# Patient Record
Sex: Male | Born: 1945 | Race: White | Hispanic: No | Marital: Married | Smoking: Never smoker
Health system: Southern US, Community
[De-identification: ages and names within clinical notes are randomized; demographics above are authoritative.]

## PROBLEM LIST (undated history)

## (undated) DIAGNOSIS — K7689 Other specified diseases of liver: Secondary | ICD-10-CM

## (undated) DIAGNOSIS — N281 Cyst of kidney, acquired: Secondary | ICD-10-CM

## (undated) DIAGNOSIS — D72810 Lymphocytopenia: Secondary | ICD-10-CM

## (undated) DIAGNOSIS — R161 Splenomegaly, not elsewhere classified: Secondary | ICD-10-CM

## (undated) DIAGNOSIS — G4730 Sleep apnea, unspecified: Secondary | ICD-10-CM

## (undated) DIAGNOSIS — I8500 Esophageal varices without bleeding: Secondary | ICD-10-CM

## (undated) DIAGNOSIS — K469 Unspecified abdominal hernia without obstruction or gangrene: Secondary | ICD-10-CM

## (undated) DIAGNOSIS — R188 Other ascites: Secondary | ICD-10-CM

## (undated) DIAGNOSIS — K7460 Unspecified cirrhosis of liver: Secondary | ICD-10-CM

## (undated) DIAGNOSIS — N200 Calculus of kidney: Secondary | ICD-10-CM

## (undated) DIAGNOSIS — I10 Essential (primary) hypertension: Secondary | ICD-10-CM

## (undated) DIAGNOSIS — K5730 Diverticulosis of large intestine without perforation or abscess without bleeding: Secondary | ICD-10-CM

## (undated) DIAGNOSIS — D126 Benign neoplasm of colon, unspecified: Secondary | ICD-10-CM

## (undated) DIAGNOSIS — M169 Osteoarthritis of hip, unspecified: Secondary | ICD-10-CM

## (undated) DIAGNOSIS — K7581 Nonalcoholic steatohepatitis (NASH): Secondary | ICD-10-CM

## (undated) DIAGNOSIS — E119 Type 2 diabetes mellitus without complications: Secondary | ICD-10-CM

## (undated) DIAGNOSIS — E785 Hyperlipidemia, unspecified: Secondary | ICD-10-CM

## (undated) DIAGNOSIS — D500 Iron deficiency anemia secondary to blood loss (chronic): Secondary | ICD-10-CM

## (undated) HISTORY — DX: Iron deficiency anemia secondary to blood loss (chronic): D50.0

## (undated) HISTORY — DX: Other specified diseases of liver: K76.89

## (undated) HISTORY — DX: Diverticulosis of large intestine without perforation or abscess without bleeding: K57.30

## (undated) HISTORY — DX: Type 2 diabetes mellitus without complications: E11.9

## (undated) HISTORY — DX: Other ascites: R18.8

## (undated) HISTORY — DX: Cyst of kidney, acquired: N28.1

## (undated) HISTORY — DX: Sleep apnea, unspecified: G47.30

## (undated) HISTORY — DX: Unspecified abdominal hernia without obstruction or gangrene: K46.9

## (undated) HISTORY — DX: Nonalcoholic steatohepatitis (NASH): K75.81

## (undated) HISTORY — DX: Osteoarthritis of hip, unspecified: M16.9

## (undated) HISTORY — DX: Calculus of kidney: N20.0

## (undated) HISTORY — DX: Benign neoplasm of colon, unspecified: D12.6

## (undated) HISTORY — DX: Hyperlipidemia, unspecified: E78.5

## (undated) HISTORY — PX: POLYPECTOMY: SHX149

## (undated) HISTORY — PX: KNEE SURGERY: SHX244

## (undated) HISTORY — DX: Essential (primary) hypertension: I10

## (undated) HISTORY — PX: ANKLE SURGERY: SHX546

## (undated) HISTORY — DX: Esophageal varices without bleeding: I85.00

## (undated) HISTORY — DX: Unspecified cirrhosis of liver: K74.60

## (undated) HISTORY — DX: Splenomegaly, not elsewhere classified: R16.1

---

## 1998-07-31 ENCOUNTER — Encounter: Admission: RE | Admit: 1998-07-31 | Discharge: 1998-10-29 | Payer: Self-pay | Admitting: Family Medicine

## 1999-01-16 ENCOUNTER — Encounter: Admission: RE | Admit: 1999-01-16 | Discharge: 1999-04-16 | Payer: Self-pay | Admitting: Family Medicine

## 1999-05-24 ENCOUNTER — Ambulatory Visit: Admission: RE | Admit: 1999-05-24 | Discharge: 1999-05-24 | Payer: Self-pay | Admitting: Family Medicine

## 1999-07-19 ENCOUNTER — Encounter: Admission: RE | Admit: 1999-07-19 | Discharge: 1999-07-19 | Payer: Self-pay | Admitting: Urology

## 1999-08-23 ENCOUNTER — Encounter: Payer: Self-pay | Admitting: Urology

## 1999-08-27 ENCOUNTER — Ambulatory Visit (HOSPITAL_COMMUNITY): Admission: RE | Admit: 1999-08-27 | Discharge: 1999-08-27 | Payer: Self-pay | Admitting: Urology

## 2000-03-23 ENCOUNTER — Encounter: Payer: Self-pay | Admitting: Family Medicine

## 2000-03-23 ENCOUNTER — Emergency Department (HOSPITAL_COMMUNITY): Admission: EM | Admit: 2000-03-23 | Discharge: 2000-03-23 | Payer: Self-pay | Admitting: Emergency Medicine

## 2000-03-23 ENCOUNTER — Ambulatory Visit (HOSPITAL_COMMUNITY): Admission: RE | Admit: 2000-03-23 | Discharge: 2000-03-23 | Payer: Self-pay | Admitting: Family Medicine

## 2000-04-24 ENCOUNTER — Encounter: Payer: Self-pay | Admitting: Urology

## 2000-04-24 ENCOUNTER — Encounter: Admission: RE | Admit: 2000-04-24 | Discharge: 2000-04-24 | Payer: Self-pay | Admitting: Urology

## 2000-04-25 ENCOUNTER — Encounter: Admission: RE | Admit: 2000-04-25 | Discharge: 2000-04-25 | Payer: Self-pay | Admitting: Urology

## 2000-04-25 ENCOUNTER — Encounter: Payer: Self-pay | Admitting: Urology

## 2000-04-28 ENCOUNTER — Ambulatory Visit (HOSPITAL_COMMUNITY): Admission: RE | Admit: 2000-04-28 | Discharge: 2000-04-28 | Payer: Self-pay | Admitting: Urology

## 2000-04-28 ENCOUNTER — Encounter: Payer: Self-pay | Admitting: Urology

## 2003-10-08 HISTORY — PX: GASTRIC BYPASS: SHX52

## 2004-03-13 ENCOUNTER — Encounter: Admission: RE | Admit: 2004-03-13 | Discharge: 2004-03-13 | Payer: Self-pay | Admitting: Surgery

## 2004-03-14 ENCOUNTER — Ambulatory Visit (HOSPITAL_BASED_OUTPATIENT_CLINIC_OR_DEPARTMENT_OTHER): Admission: RE | Admit: 2004-03-14 | Discharge: 2004-03-14 | Payer: Self-pay | Admitting: Surgery

## 2004-03-20 ENCOUNTER — Ambulatory Visit (HOSPITAL_COMMUNITY): Admission: RE | Admit: 2004-03-20 | Discharge: 2004-03-20 | Payer: Self-pay | Admitting: Surgery

## 2004-03-21 ENCOUNTER — Encounter: Admission: RE | Admit: 2004-03-21 | Discharge: 2004-06-19 | Payer: Self-pay | Admitting: Surgery

## 2004-04-13 ENCOUNTER — Inpatient Hospital Stay (HOSPITAL_BASED_OUTPATIENT_CLINIC_OR_DEPARTMENT_OTHER): Admission: RE | Admit: 2004-04-13 | Discharge: 2004-04-13 | Payer: Self-pay | Admitting: Cardiology

## 2004-05-08 ENCOUNTER — Ambulatory Visit (HOSPITAL_COMMUNITY): Admission: RE | Admit: 2004-05-08 | Discharge: 2004-05-08 | Payer: Self-pay | Admitting: Surgery

## 2004-05-08 ENCOUNTER — Encounter (INDEPENDENT_AMBULATORY_CARE_PROVIDER_SITE_OTHER): Payer: Self-pay | Admitting: *Deleted

## 2004-05-15 ENCOUNTER — Encounter (INDEPENDENT_AMBULATORY_CARE_PROVIDER_SITE_OTHER): Payer: Self-pay | Admitting: *Deleted

## 2004-05-15 ENCOUNTER — Inpatient Hospital Stay (HOSPITAL_COMMUNITY): Admission: RE | Admit: 2004-05-15 | Discharge: 2004-05-19 | Payer: Self-pay | Admitting: Surgery

## 2004-06-18 ENCOUNTER — Emergency Department (HOSPITAL_COMMUNITY): Admission: EM | Admit: 2004-06-18 | Discharge: 2004-06-18 | Payer: Self-pay | Admitting: Emergency Medicine

## 2004-07-02 ENCOUNTER — Encounter: Admission: RE | Admit: 2004-07-02 | Discharge: 2004-09-30 | Payer: Self-pay | Admitting: Surgery

## 2004-07-12 ENCOUNTER — Ambulatory Visit (HOSPITAL_BASED_OUTPATIENT_CLINIC_OR_DEPARTMENT_OTHER): Admission: RE | Admit: 2004-07-12 | Discharge: 2004-07-12 | Payer: Self-pay | Admitting: Urology

## 2004-07-12 ENCOUNTER — Ambulatory Visit (HOSPITAL_COMMUNITY): Admission: RE | Admit: 2004-07-12 | Discharge: 2004-07-12 | Payer: Self-pay | Admitting: Urology

## 2004-10-10 ENCOUNTER — Encounter: Admission: RE | Admit: 2004-10-10 | Discharge: 2005-01-08 | Payer: Self-pay | Admitting: Surgery

## 2005-03-14 ENCOUNTER — Encounter: Admission: RE | Admit: 2005-03-14 | Discharge: 2005-06-12 | Payer: Self-pay | Admitting: Surgery

## 2005-05-15 ENCOUNTER — Ambulatory Visit: Payer: Self-pay | Admitting: Oncology

## 2005-05-31 ENCOUNTER — Ambulatory Visit: Payer: Self-pay | Admitting: Sports Medicine

## 2005-06-24 ENCOUNTER — Ambulatory Visit (HOSPITAL_COMMUNITY): Admission: RE | Admit: 2005-06-24 | Discharge: 2005-06-24 | Payer: Self-pay | Admitting: Oncology

## 2005-06-24 ENCOUNTER — Encounter (INDEPENDENT_AMBULATORY_CARE_PROVIDER_SITE_OTHER): Payer: Self-pay | Admitting: Specialist

## 2005-07-04 ENCOUNTER — Ambulatory Visit: Payer: Self-pay | Admitting: Oncology

## 2005-09-05 ENCOUNTER — Ambulatory Visit: Payer: Self-pay | Admitting: Oncology

## 2005-09-13 ENCOUNTER — Ambulatory Visit (HOSPITAL_COMMUNITY): Admission: RE | Admit: 2005-09-13 | Discharge: 2005-09-13 | Payer: Self-pay | Admitting: Oncology

## 2005-11-27 ENCOUNTER — Ambulatory Visit: Payer: Self-pay | Admitting: Oncology

## 2006-03-25 ENCOUNTER — Ambulatory Visit: Payer: Self-pay | Admitting: Oncology

## 2006-05-06 ENCOUNTER — Ambulatory Visit: Payer: Self-pay | Admitting: Oncology

## 2006-05-08 LAB — CBC WITH DIFFERENTIAL/PLATELET
Eosinophils Absolute: 0 10*3/uL (ref 0.0–0.5)
MCV: 80.7 fL — ABNORMAL LOW (ref 81.6–98.0)
MONO%: 5.8 % (ref 0.0–13.0)
NEUT#: 2.9 10*3/uL (ref 1.5–6.5)
RBC: 4.66 10*6/uL (ref 4.20–5.71)
RDW: 14.4 % (ref 11.2–14.6)
WBC: 3.8 10*3/uL — ABNORMAL LOW (ref 4.0–10.0)

## 2006-05-08 LAB — COMPREHENSIVE METABOLIC PANEL
AST: 22 U/L (ref 0–37)
Albumin: 4.5 g/dL (ref 3.5–5.2)
Alkaline Phosphatase: 117 U/L (ref 39–117)
Glucose, Bld: 110 mg/dL — ABNORMAL HIGH (ref 70–99)
Potassium: 4.4 mEq/L (ref 3.5–5.3)
Sodium: 142 mEq/L (ref 135–145)
Total Protein: 6.3 g/dL (ref 6.0–8.3)

## 2006-05-14 ENCOUNTER — Encounter: Admission: RE | Admit: 2006-05-14 | Discharge: 2006-05-14 | Payer: Self-pay | Admitting: Orthopedic Surgery

## 2006-06-04 ENCOUNTER — Encounter: Admission: RE | Admit: 2006-06-04 | Discharge: 2006-06-04 | Payer: Self-pay | Admitting: Orthopedic Surgery

## 2006-06-25 ENCOUNTER — Encounter: Admission: RE | Admit: 2006-06-25 | Discharge: 2006-06-25 | Payer: Self-pay | Admitting: Orthopedic Surgery

## 2006-10-07 HISTORY — PX: TOTAL HIP ARTHROPLASTY: SHX124

## 2006-12-29 ENCOUNTER — Ambulatory Visit: Payer: Self-pay | Admitting: Oncology

## 2006-12-31 LAB — CBC WITH DIFFERENTIAL/PLATELET
Basophils Absolute: 0 10*3/uL (ref 0.0–0.1)
Eosinophils Absolute: 0 10*3/uL (ref 0.0–0.5)
HGB: 11.3 g/dL — ABNORMAL LOW (ref 13.0–17.1)
MCV: 71.6 fL — ABNORMAL LOW (ref 81.6–98.0)
MONO#: 0.2 10*3/uL (ref 0.1–0.9)
NEUT#: 2.6 10*3/uL (ref 1.5–6.5)
Platelets: 80 10*3/uL — ABNORMAL LOW (ref 145–400)
RBC: 4.66 10*6/uL (ref 4.20–5.71)
RDW: 16.5 % — ABNORMAL HIGH (ref 11.2–14.6)
WBC: 3.3 10*3/uL — ABNORMAL LOW (ref 4.0–10.0)

## 2006-12-31 LAB — IRON AND TIBC
Iron: 35 ug/dL — ABNORMAL LOW (ref 42–165)
UIBC: 401 ug/dL

## 2006-12-31 LAB — FERRITIN: Ferritin: 6 ng/mL — ABNORMAL LOW (ref 22–322)

## 2006-12-31 LAB — COMPREHENSIVE METABOLIC PANEL
Albumin: 4.4 g/dL (ref 3.5–5.2)
BUN: 17 mg/dL (ref 6–23)
CO2: 24 mEq/L (ref 19–32)
Calcium: 9.3 mg/dL (ref 8.4–10.5)
Chloride: 109 mEq/L (ref 96–112)
Glucose, Bld: 113 mg/dL — ABNORMAL HIGH (ref 70–99)
Potassium: 4.6 mEq/L (ref 3.5–5.3)
Sodium: 142 mEq/L (ref 135–145)
Total Protein: 6.6 g/dL (ref 6.0–8.3)

## 2007-01-28 ENCOUNTER — Ambulatory Visit (HOSPITAL_COMMUNITY): Admission: RE | Admit: 2007-01-28 | Discharge: 2007-01-28 | Payer: Self-pay | Admitting: Oncology

## 2007-07-01 ENCOUNTER — Ambulatory Visit: Payer: Self-pay | Admitting: Oncology

## 2007-08-24 ENCOUNTER — Ambulatory Visit: Payer: Self-pay | Admitting: Internal Medicine

## 2007-08-24 LAB — CONVERTED CEMR LAB
Basophils Relative: 0.5 % (ref 0.0–1.0)
Eosinophils Absolute: 0.1 10*3/uL (ref 0.0–0.6)
Eosinophils Relative: 1.4 % (ref 0.0–5.0)
Platelets: 112 10*3/uL — ABNORMAL LOW (ref 150–400)
RBC: 4.59 M/uL (ref 4.22–5.81)
WBC: 4.2 10*3/uL — ABNORMAL LOW (ref 4.5–10.5)

## 2007-09-10 ENCOUNTER — Ambulatory Visit: Payer: Self-pay | Admitting: Internal Medicine

## 2007-09-10 ENCOUNTER — Encounter: Payer: Self-pay | Admitting: Internal Medicine

## 2007-11-20 DIAGNOSIS — D509 Iron deficiency anemia, unspecified: Secondary | ICD-10-CM | POA: Insufficient documentation

## 2007-11-20 DIAGNOSIS — E785 Hyperlipidemia, unspecified: Secondary | ICD-10-CM

## 2007-11-20 DIAGNOSIS — D126 Benign neoplasm of colon, unspecified: Secondary | ICD-10-CM

## 2007-11-20 DIAGNOSIS — K648 Other hemorrhoids: Secondary | ICD-10-CM | POA: Insufficient documentation

## 2007-11-20 DIAGNOSIS — E119 Type 2 diabetes mellitus without complications: Secondary | ICD-10-CM

## 2007-11-20 DIAGNOSIS — M159 Polyosteoarthritis, unspecified: Secondary | ICD-10-CM

## 2007-11-20 DIAGNOSIS — G4730 Sleep apnea, unspecified: Secondary | ICD-10-CM

## 2007-11-20 DIAGNOSIS — I10 Essential (primary) hypertension: Secondary | ICD-10-CM | POA: Insufficient documentation

## 2007-11-20 DIAGNOSIS — Z87442 Personal history of urinary calculi: Secondary | ICD-10-CM

## 2007-11-20 DIAGNOSIS — K5730 Diverticulosis of large intestine without perforation or abscess without bleeding: Secondary | ICD-10-CM | POA: Insufficient documentation

## 2008-01-27 ENCOUNTER — Encounter: Admission: RE | Admit: 2008-01-27 | Discharge: 2008-01-27 | Payer: Self-pay | Admitting: Orthopedic Surgery

## 2008-03-15 ENCOUNTER — Ambulatory Visit: Payer: Self-pay | Admitting: Oncology

## 2008-03-15 LAB — CBC WITH DIFFERENTIAL/PLATELET
BASO%: 0.3 % (ref 0.0–2.0)
LYMPH%: 11.8 % — ABNORMAL LOW (ref 14.0–48.0)
MCHC: 32.6 g/dL (ref 32.0–35.9)
MONO#: 0.2 10*3/uL (ref 0.1–0.9)
RBC: 4.63 10*6/uL (ref 4.20–5.71)
RDW: 19.7 % — ABNORMAL HIGH (ref 11.2–14.6)
WBC: 4.2 10*3/uL (ref 4.0–10.0)
lymph#: 0.5 10*3/uL — ABNORMAL LOW (ref 0.9–3.3)

## 2008-03-18 LAB — ABO/RH

## 2008-03-21 ENCOUNTER — Inpatient Hospital Stay (HOSPITAL_COMMUNITY): Admission: RE | Admit: 2008-03-21 | Discharge: 2008-03-24 | Payer: Self-pay | Admitting: Orthopedic Surgery

## 2008-04-14 LAB — CBC WITH DIFFERENTIAL/PLATELET
BASO%: 0.3 % (ref 0.0–2.0)
Basophils Absolute: 0 10*3/uL (ref 0.0–0.1)
HCT: 30.9 % — ABNORMAL LOW (ref 38.7–49.9)
HGB: 10.3 g/dL — ABNORMAL LOW (ref 13.0–17.1)
MONO#: 0.2 10*3/uL (ref 0.1–0.9)
NEUT%: 74.1 % (ref 40.0–75.0)
WBC: 3 10*3/uL — ABNORMAL LOW (ref 4.0–10.0)
lymph#: 0.5 10*3/uL — ABNORMAL LOW (ref 0.9–3.3)

## 2008-04-14 LAB — IRON AND TIBC
%SAT: 7 % — ABNORMAL LOW (ref 20–55)
Iron: 31 ug/dL — ABNORMAL LOW (ref 42–165)

## 2008-04-14 LAB — COMPREHENSIVE METABOLIC PANEL
ALT: 19 U/L (ref 0–53)
Albumin: 4.4 g/dL (ref 3.5–5.2)
BUN: 16 mg/dL (ref 6–23)
CO2: 23 mEq/L (ref 19–32)
Calcium: 9.2 mg/dL (ref 8.4–10.5)
Chloride: 109 mEq/L (ref 96–112)
Creatinine, Ser: 1.01 mg/dL (ref 0.40–1.50)

## 2008-04-14 LAB — FERRITIN: Ferritin: 18 ng/mL — ABNORMAL LOW (ref 22–322)

## 2008-04-25 ENCOUNTER — Ambulatory Visit: Payer: Self-pay | Admitting: Oncology

## 2008-05-02 ENCOUNTER — Ambulatory Visit: Payer: Self-pay | Admitting: Internal Medicine

## 2008-05-17 ENCOUNTER — Ambulatory Visit: Payer: Self-pay | Admitting: Internal Medicine

## 2008-05-17 ENCOUNTER — Encounter: Payer: Self-pay | Admitting: Internal Medicine

## 2008-05-20 ENCOUNTER — Encounter: Payer: Self-pay | Admitting: Internal Medicine

## 2008-07-15 ENCOUNTER — Ambulatory Visit: Payer: Self-pay | Admitting: Oncology

## 2008-07-29 LAB — COMPREHENSIVE METABOLIC PANEL
ALT: 24 U/L (ref 0–53)
Alkaline Phosphatase: 123 U/L — ABNORMAL HIGH (ref 39–117)
Sodium: 142 mEq/L (ref 135–145)
Total Bilirubin: 0.5 mg/dL (ref 0.3–1.2)
Total Protein: 5.9 g/dL — ABNORMAL LOW (ref 6.0–8.3)

## 2008-07-29 LAB — IRON AND TIBC: %SAT: 7 % — ABNORMAL LOW (ref 20–55)

## 2008-07-29 LAB — CBC WITH DIFFERENTIAL/PLATELET
Basophils Absolute: 0 10*3/uL (ref 0.0–0.1)
EOS%: 1.7 % (ref 0.0–7.0)
Eosinophils Absolute: 0.1 10*3/uL (ref 0.0–0.5)
HGB: 11.4 g/dL — ABNORMAL LOW (ref 13.0–17.1)
MCH: 25.8 pg — ABNORMAL LOW (ref 28.0–33.4)
MONO#: 0.2 10*3/uL (ref 0.1–0.9)
NEUT#: 2.4 10*3/uL (ref 1.5–6.5)
RDW: 17.8 % — ABNORMAL HIGH (ref 11.2–14.6)
WBC: 3.1 10*3/uL — ABNORMAL LOW (ref 4.0–10.0)
lymph#: 0.5 10*3/uL — ABNORMAL LOW (ref 0.9–3.3)

## 2008-08-02 ENCOUNTER — Encounter: Payer: Self-pay | Admitting: Gastroenterology

## 2008-11-17 ENCOUNTER — Ambulatory Visit: Payer: Self-pay | Admitting: Oncology

## 2008-11-21 LAB — CBC WITH DIFFERENTIAL/PLATELET
BASO%: 0.7 % (ref 0.0–2.0)
EOS%: 1.5 % (ref 0.0–7.0)
HCT: 35.3 % — ABNORMAL LOW (ref 38.7–49.9)
LYMPH%: 16 % (ref 14.0–48.0)
MCH: 24.5 pg — ABNORMAL LOW (ref 28.0–33.4)
MCHC: 33 g/dL (ref 32.0–35.9)
NEUT%: 75.8 % — ABNORMAL HIGH (ref 40.0–75.0)
Platelets: 76 10*3/uL — ABNORMAL LOW (ref 145–400)
RBC: 4.76 10*6/uL (ref 4.20–5.71)

## 2008-11-21 LAB — COMPREHENSIVE METABOLIC PANEL
ALT: 31 U/L (ref 0–53)
AST: 32 U/L (ref 0–37)
Alkaline Phosphatase: 106 U/L (ref 39–117)
Creatinine, Ser: 1.08 mg/dL (ref 0.40–1.50)
Sodium: 139 mEq/L (ref 135–145)
Total Bilirubin: 0.8 mg/dL (ref 0.3–1.2)

## 2008-11-22 LAB — IRON AND TIBC
%SAT: 33 % (ref 20–55)
TIBC: 457 ug/dL — ABNORMAL HIGH (ref 215–435)

## 2008-11-23 ENCOUNTER — Encounter: Payer: Self-pay | Admitting: Gastroenterology

## 2008-12-30 ENCOUNTER — Ambulatory Visit: Payer: Self-pay | Admitting: Oncology

## 2009-04-21 ENCOUNTER — Encounter (INDEPENDENT_AMBULATORY_CARE_PROVIDER_SITE_OTHER): Payer: Self-pay | Admitting: *Deleted

## 2009-05-15 ENCOUNTER — Ambulatory Visit: Payer: Self-pay | Admitting: Oncology

## 2009-05-16 ENCOUNTER — Encounter: Admission: RE | Admit: 2009-05-16 | Discharge: 2009-05-16 | Payer: Self-pay | Admitting: Orthopedic Surgery

## 2009-05-17 ENCOUNTER — Encounter: Payer: Self-pay | Admitting: Internal Medicine

## 2009-05-17 ENCOUNTER — Encounter: Payer: Self-pay | Admitting: Gastroenterology

## 2009-05-17 LAB — COMPREHENSIVE METABOLIC PANEL
ALT: 27 U/L (ref 0–53)
AST: 33 U/L (ref 0–37)
CO2: 23 mEq/L (ref 19–32)
Calcium: 9.3 mg/dL (ref 8.4–10.5)
Chloride: 106 mEq/L (ref 96–112)
Potassium: 4.7 mEq/L (ref 3.5–5.3)
Sodium: 139 mEq/L (ref 135–145)
Total Protein: 6.5 g/dL (ref 6.0–8.3)

## 2009-05-17 LAB — CBC WITH DIFFERENTIAL/PLATELET
BASO%: 0 % (ref 0.0–2.0)
EOS%: 0.3 % (ref 0.0–7.0)
HCT: 40.8 % (ref 38.4–49.9)
MCHC: 34.7 g/dL (ref 32.0–36.0)
MONO#: 0.3 10*3/uL (ref 0.1–0.9)
NEUT%: 87.4 % — ABNORMAL HIGH (ref 39.0–75.0)
RBC: 4.55 10*6/uL (ref 4.20–5.82)
RDW: 14 % (ref 11.0–14.6)
WBC: 6.6 10*3/uL (ref 4.0–10.3)
lymph#: 0.5 10*3/uL — ABNORMAL LOW (ref 0.9–3.3)

## 2009-05-17 LAB — FERRITIN: Ferritin: 30 ng/mL (ref 22–322)

## 2009-05-22 ENCOUNTER — Ambulatory Visit: Payer: Self-pay | Admitting: Internal Medicine

## 2009-06-05 ENCOUNTER — Ambulatory Visit: Payer: Self-pay | Admitting: Internal Medicine

## 2009-06-20 ENCOUNTER — Encounter: Admission: RE | Admit: 2009-06-20 | Discharge: 2009-06-20 | Payer: Self-pay | Admitting: Orthopedic Surgery

## 2009-11-10 ENCOUNTER — Ambulatory Visit: Payer: Self-pay | Admitting: Oncology

## 2010-01-12 ENCOUNTER — Ambulatory Visit: Payer: Self-pay | Admitting: Oncology

## 2010-01-17 ENCOUNTER — Encounter: Payer: Self-pay | Admitting: Internal Medicine

## 2010-01-17 LAB — CBC WITH DIFFERENTIAL/PLATELET
BASO%: 0.2 % (ref 0.0–2.0)
HCT: 38.8 % (ref 38.4–49.9)
LYMPH%: 12.9 % — ABNORMAL LOW (ref 14.0–49.0)
MCH: 28.2 pg (ref 27.2–33.4)
MCHC: 33.9 g/dL (ref 32.0–36.0)
MCV: 83.1 fL (ref 79.3–98.0)
MONO%: 5.9 % (ref 0.0–14.0)
NEUT%: 80.1 % — ABNORMAL HIGH (ref 39.0–75.0)
Platelets: 65 10*3/uL — ABNORMAL LOW (ref 140–400)
RBC: 4.66 10*6/uL (ref 4.20–5.82)

## 2010-01-17 LAB — IRON AND TIBC: Iron: 103 ug/dL (ref 42–165)

## 2010-01-17 LAB — COMPREHENSIVE METABOLIC PANEL
ALT: 29 U/L (ref 0–53)
Albumin: 4.7 g/dL (ref 3.5–5.2)
CO2: 22 mEq/L (ref 19–32)
Calcium: 8.7 mg/dL (ref 8.4–10.5)
Chloride: 108 mEq/L (ref 96–112)
Potassium: 4.1 mEq/L (ref 3.5–5.3)
Sodium: 140 mEq/L (ref 135–145)
Total Bilirubin: 0.7 mg/dL (ref 0.3–1.2)
Total Protein: 6.3 g/dL (ref 6.0–8.3)

## 2010-01-17 LAB — FERRITIN: Ferritin: 17 ng/mL — ABNORMAL LOW (ref 22–322)

## 2010-05-10 ENCOUNTER — Encounter: Admission: RE | Admit: 2010-05-10 | Discharge: 2010-05-10 | Payer: Self-pay | Admitting: Orthopedic Surgery

## 2010-06-14 ENCOUNTER — Encounter: Admission: RE | Admit: 2010-06-14 | Discharge: 2010-06-14 | Payer: Self-pay | Admitting: Orthopedic Surgery

## 2010-07-17 ENCOUNTER — Ambulatory Visit: Payer: Self-pay | Admitting: Oncology

## 2010-07-19 ENCOUNTER — Encounter: Payer: Self-pay | Admitting: Internal Medicine

## 2010-07-19 LAB — IRON AND TIBC
Iron: 81 ug/dL (ref 42–165)
TIBC: 310 ug/dL (ref 215–435)
UIBC: 229 ug/dL

## 2010-07-19 LAB — CBC WITH DIFFERENTIAL/PLATELET
Basophils Absolute: 0 10*3/uL (ref 0.0–0.1)
Eosinophils Absolute: 0.1 10*3/uL (ref 0.0–0.5)
HGB: 13 g/dL (ref 13.0–17.1)
MCV: 91.1 fL (ref 79.3–98.0)
MONO#: 0.2 10*3/uL (ref 0.1–0.9)
MONO%: 5.7 % (ref 0.0–14.0)
NEUT#: 2.9 10*3/uL (ref 1.5–6.5)
Platelets: 56 10*3/uL — ABNORMAL LOW (ref 140–400)
RDW: 13.7 % (ref 11.0–14.6)

## 2010-07-19 LAB — COMPREHENSIVE METABOLIC PANEL
Albumin: 4.2 g/dL (ref 3.5–5.2)
Alkaline Phosphatase: 102 U/L (ref 39–117)
BUN: 15 mg/dL (ref 6–23)
CO2: 21 mEq/L (ref 19–32)
Calcium: 8.6 mg/dL (ref 8.4–10.5)
Chloride: 111 mEq/L (ref 96–112)
Glucose, Bld: 144 mg/dL — ABNORMAL HIGH (ref 70–99)
Potassium: 4.2 mEq/L (ref 3.5–5.3)

## 2010-10-07 HISTORY — PX: COLONOSCOPY: SHX174

## 2010-10-28 ENCOUNTER — Encounter: Payer: Self-pay | Admitting: Oncology

## 2010-11-08 NOTE — Letter (Signed)
[facility]   Imported By: Sherian Rein 02/05/2010 11:52:27  _____________________________________________________________________  External Attachment:    Type:   Image     Comment:   External Document

## 2010-11-08 NOTE — Letter (Signed)
Summary: [facility]  [facility]   Imported By: Lester [place] 08/09/2010 09:28:06  _____________________________________________________________________  External Attachment:    Type:   Image     Comment:   External Document

## 2011-01-18 ENCOUNTER — Encounter (HOSPITAL_BASED_OUTPATIENT_CLINIC_OR_DEPARTMENT_OTHER): Payer: BC Managed Care – PPO | Admitting: Oncology

## 2011-01-18 ENCOUNTER — Other Ambulatory Visit: Payer: Self-pay | Admitting: Oncology

## 2011-01-18 DIAGNOSIS — Z9884 Bariatric surgery status: Secondary | ICD-10-CM

## 2011-01-18 DIAGNOSIS — D696 Thrombocytopenia, unspecified: Secondary | ICD-10-CM

## 2011-01-18 DIAGNOSIS — D72819 Decreased white blood cell count, unspecified: Secondary | ICD-10-CM

## 2011-01-18 DIAGNOSIS — D509 Iron deficiency anemia, unspecified: Secondary | ICD-10-CM

## 2011-01-18 LAB — COMPREHENSIVE METABOLIC PANEL
BUN: 17 mg/dL (ref 6–23)
CO2: 22 mEq/L (ref 19–32)
Calcium: 8.3 mg/dL — ABNORMAL LOW (ref 8.4–10.5)
Chloride: 108 mEq/L (ref 96–112)
Creatinine, Ser: 1.07 mg/dL (ref 0.40–1.50)

## 2011-01-18 LAB — IRON AND TIBC
TIBC: 356 ug/dL (ref 215–435)
UIBC: 242 ug/dL

## 2011-01-18 LAB — CBC WITH DIFFERENTIAL/PLATELET
Basophils Absolute: 0 10*3/uL (ref 0.0–0.1)
EOS%: 1.3 % (ref 0.0–7.0)
HCT: 38.1 % — ABNORMAL LOW (ref 38.4–49.9)
HGB: 13.1 g/dL (ref 13.0–17.1)
MCH: 30.9 pg (ref 27.2–33.4)
MONO#: 0.1 10*3/uL (ref 0.1–0.9)
NEUT%: 83.5 % — ABNORMAL HIGH (ref 39.0–75.0)
lymph#: 0.4 10*3/uL — ABNORMAL LOW (ref 0.9–3.3)

## 2011-01-18 LAB — FERRITIN: Ferritin: 46 ng/mL (ref 22–322)

## 2011-02-19 NOTE — H&P (Signed)
[name] House, [name] House NO.:  [id number]   MEDICAL RECORD NO.:  [id number]          PATIENT TYPE:  INP   LOCATION:  NA                           FACILITY:  [facility]   PHYSICIAN:  Ollen Gross, M.D.    DATE OF BIRTH:  1946-06-05   DATE OF ADMISSION:  03/21/2008  DATE OF DISCHARGE:                              HISTORY & PHYSICAL   CHIEF COMPLAINT:  Right hip pain.   HISTORY OF PRESENT ILLNESS:  The patient is a 65 year old male who is  seen by Dr. Lequita Halt for ongoing right hip pain.  He is a Runner, broadcasting/film/video at  [employer], and has been complaining of his right hip for quite some time  now.  It was November of last year when he started developing  significant pain in the buttock and lateral hip pain and groin area.  He  also is a Careers adviser and baseball umpire.  Extremely active the  high school athletics.  It has progressively gotten worse and  interfering with his ability to work.  He is seen in the office, found  to have severe end-stage arthritis of the right hip with bone-on-bone  and some bony erosion in osteophyte formation.  It is felt now he has  reached a point where he would benefit from undergoing surgical  intervention.  We have reached the summertime when he is out of school.  Risks and benefits have been discussed.  He elected to proceed with  surgery.   ALLERGIES:  No known drug allergies.   CURRENT MEDICATIONS:  Celebrex, lisinopril, Ferrex, multivitamins  including chewable vitamin, iron supplements, B-complex, folic acid and  vitamin E.   PAST MEDICAL HISTORY:  1. Hypertension.  2. Anemia.  3. Diet-controlled diabetes mellitus which did resolve after is      gastric bypass surgery.  4. Renal calculi.  5. History of colonic polyps.   PAST SURGERIES:  1. Normal catheterization and cardiac catheterization in 2005.  2. Gallbladder 2005.  3. Gastric bypass surgery 2005.  4. Colonoscopy with removal of about 10 polyps.  5. Bilateral knee scopes.  6. Ankle surgery.   SOCIAL HISTORY:  Married, one daughter.  Denies tobacco products.  Seldom intake alcohol.  Teacher at [employer].   FAMILY HISTORY:  Father deceased age 37 of heart disease.  Mother  deceased age 13 with diabetes.   REVIEW OF SYSTEMS:  GENERAL:  No fevers, night sweats.  NEUROLOGICAL:  No Seizures, syncope or paralysis.  RESPIRATORY:  No shortness breath,  productive cough or hemoptysis.  CARDIOVASCULAR:  No chest pain,  orthopnea.  GI:  No nausea, vomiting, diarrhea or constipation.  GU: No  hematuria, dysuria or discharge.  Musculoskeletal: Right hip.   PHYSICAL EXAMINATION:  VITAL SIGNS:  Pulse 70, respirations 12, blood  pressure 130/72.  GENERAL:  A 65 year old white male well-nourished, well-developed, no  acute distress, accompanied by his wife, tall frame.  HEENT:  Normocephalic, atraumatic.  Pupils round and reactive.  Oropharynx clear.  EOMs intact.  NECK:  Supple.  CHEST:  Clear.  HEART:  Regular rate and rhythm.  ABDOMEN:  Slightly round.  Bowel sounds present.  RECTAL/BREASTS/GU:  Not done, not pertinent to present illness.  EXTREMITIES:  Right hip flexion 95, 0 internal rotation, about a 5  degrees external rotation contracture.  He is able to rotate externally  to about 20 degrees, abduction about 20-30 degrees.   IMPRESSION:  Osteoarthritis right hip.   PLAN:  The patient admitted to [facility], undergo a right  total hip replacement arthroplasty.  Surgery will be performed by Dr.  Ollen Gross.      Alexzandrew L. Perkins, P.A.C.      Ollen Gross, M.D.  Electronically Signed    ALP/MEDQ  D:  03/21/2008  T:  03/21/2008  Job:  [id number]   cc:   Ollen Gross, M.D.  Fax: [phone]

## 2011-02-19 NOTE — Discharge Summary (Signed)
NAMECHRSITOPHER, WIK NO.:  [id number]   MEDICAL RECORD NO.:  [id number]          PATIENT TYPE:  INP   LOCATION:  1617                         FACILITY:  [facility]   PHYSICIAN:  Ollen Gross, M.D.    DATE OF BIRTH:  25-Feb-1946   DATE OF ADMISSION:  03/21/2008  DATE OF DISCHARGE:  03/24/2008                               DISCHARGE SUMMARY   ADMITTING DIAGNOSES:  1. Osteoarthritis right hip.  2. Hypertension.  3. Anemia.  4. Diet-controlled diabetes mellitus (resolved after gastric bypass      surgery).  5. Renal calculi.  6. History of colonic polyps.   DISCHARGE DIAGNOSES:  1. Osteoarthritis right hip, status post right total hip replacement      arthroplasty.  2. Postoperative acute blood loss anemia.  3. Status post transfusion, without sequelae.  4. Hypertension.  5. Anemia.  6. Diet-controlled diabetes mellitus (resolved after gastric bypass      surgery).  7. Renal calculi.  8. History of colonic polyps.   PROCEDURE:  On March 21, 2008, right total hip.  Surgeon:  Dr. Lequita Halt.  Assistant:  Patrica Duel, P.A.-C.  Anesthesia:  General.   CONSULTS:  None.   BRIEF HISTORY:  [name] is a 65 year old male with end-stage  arthritis of the right hip, with progressive worsening pain and  dysfunction, who now presents for a total hip arthroplasty.   LABORATORY DATA:  Preoperative CBC showed hemoglobin low at 10.2,  hematocrit 31.3, white cell count 3.2, red cell count 4.66, platelets  low at 63.  Postoperative hemoglobin down to 9.2, drifted down to 8.6,  given 2 units of blood, back up to 9.5, last noted H&H 8.9 and 27.1.  Platelets came up to 82, back down to 61.  PT/PTT preop 14.6 and 27,  respectively.  INR 1.  Serial pro times followed.  Last noted PT/INR  19.5 and 1.6.  Chem panel on admission:  Low total protein of 5.9.  Minimally elevated glucose at 132.  Remaining chem panel within normal  limits.  Serial BMETs were followed.   Electrolytes remained within  normal limits.  Glucose went up from 132 to 189, back down to 124.  Preop UA negative.  Blood group type O positive.   EKG, March 10, 2008:  Normal sinus rhythm, normal EKG, confirmed by Dr.  Algie Coffer.  Hip films preoperatively:  Advanced degenerative changes  involving the right hip on March 10, 2008.  Chest x-ray, March 10, 2008:  No  active disease.   HOSPITAL COURSE:  The patient was admitted to [facility] and  tolerated the procedure well, and later transferred to the recovery room  and the orthopedic floor.  Started on PCA and p.o. analgesic for pain  control following surgery.  He had a good night after surgery and doing  pretty well on the morning of day #1, given 24 hours of postop IV  antibiotics.  Blood pressure was a little on the lower side, so we held  his ACE inhibitor blood pressure medication.  The drain was left in that  was placed at the time of surgery.  Discontinued the knee immobilizer.  Hemoglobin was a little low, and he had some preexisting  thrombocytopenia, which was noted preoperatively.  That was followed  closely, and his platelets essentially stayed between 60 and 80 the  whole time, with no big change.  He did have the drop in blood, though,  so we did give him a transfusion.  He got up and ambulated pretty well  on day #1 and also into day #2.  Dressing was changed on day #2.  Drain  was pulled.  Incision was healing well.  His thrombocytopenia  preoperatively was stable.  He continued to progress well with physical  therapy, and by day #3 he was tolerating medications, meeting his goals,  and was discharged home.   DISCHARGE PLAN:  1. The patient was discharged to home on March 21, 2008.  2. Discharge diagnoses:  Please see above.   DISCHARGE MEDICATIONS:  1. Percocet.  2. Robaxin.  3. Coumadin.   DIET:  Heart-healthy diet.   ACTIVITY:  Partial weightbearing 25%-50% right leg.  Hip precautions  with total hip  protocol.   FOLLOWUP:  In 2 weeks.   DISPOSITION:  Home.   CONDITION UPON DISCHARGE:  Improved.      Alexzandrew L. Perkins, P.A.C.      Ollen Gross, M.D.  Electronically Signed    ALP/MEDQ  D:  04/29/2008  T:  04/29/2008  Job:  [id number]

## 2011-02-19 NOTE — Assessment & Plan Note (Signed)
[facility]                         GASTROENTEROLOGY OFFICE NOTE   NAME:BUTLERHaitham, [name] House                     MRN:          [id number]  DATE:08/24/2007                            DOB:          [birth year]    REFERRING PHYSICIAN:  Barry Dienes. Eloise Harman, M.D.   REASON FOR CONSULTATION:  Iron deficiency anemia.   HISTORY:  This is a 65 year old white male with a prior history of  morbid obesity, for which he underwent laparoscopic gastric bypass  surgery approximately three years ago.  He has a prior history of  diabetes, which has resolved.  Also hyperlipidemia, hypertension,  osteoarthritis, kidney stones and sleep apnea.  He is also noted to have  a stable splenomegaly, for which he is followed by Dr. Blenda Nicely. Shadad.  The patient is referred now for iron deficiency anemia.   LABORATORY DATA:  Obtained on July 09, 2007, reveal anemia, with a  hemoglobin of 9.6.  His indices were microcytic with an MCV of 69.8.  He  was also leukopenic with a white blood cell count of 2.7 and  thrombocytopenic with a platelet count of 89,000.   Post-laparoscopic gastric bypass surgery he was to have been on iron  replacement, but was not particularly compliant.  He has been on iron  over the past month.  The patient denies abdominal pain, change in bowel  habits, melena or hematochezia.  His stools have been a bit darker on  iron.  His wife reports that his flatus is foul-smelling.  He denies a  prior history of GI problems or GI investigations.   PAST MEDICAL/SURGICAL HISTORY:  1. Hypertension.  2. Diabetes (diet-controlled at this point).  3. Dyslipidemia.  4. Osteoarthritis.  5. Kidney stones.  6. Sleep apnea.  7. Morbid obesity, status post laparoscopic gastric bypass with      incidental cholecystectomy.  The patient has lost approximately 160      pounds since his surgery.  8. Chronic stable splenomegaly.  9. Cosmetic surgery to remove excessive skin  after weight loss.   ALLERGIES:  No known drug allergies.   CURRENT MEDICATIONS:  1. Lisinopril 10 mg daily.  2. Iron 150 mg twice daily.  3. B12.  4. Multivitamin.  5. Celebrex p.r.n.   FAMILY HISTORY:  No family history of colon cancer or colon polyps.  A  brother and father with heart disease.  Mother with diabetes.   SOCIAL HISTORY:  The patient is married, with one daughter.  He is  accompanied by his wife, with whom he lives.  He attended graduate  school.  He is retired from [employer] and works  currently as a Pension scheme manager and girls' Therapist, occupational at  [employer].  He does not smoke and rarely uses alcohol.   REVIEW OF SYSTEMS:  Per the diagnostic evaluation form.   PHYSICAL EXAMINATION:  GENERAL:  A well-appearing male, in no acute  distress.  VITAL SIGNS:  Blood pressure 120/60, heart rate 88, weight 231 pounds.  He is 6 feet 1 inch in height.  HEENT:  Sclerae anicteric.  Conjunctivae  pink.  Oral mucosa intact.  NECK:  No adenopathy.  LUNGS:  Clear.  HEART:  Regular.  ABDOMEN:  Soft without mass, tenderness or hernia.   IMPRESSION:  This is a 65 year old gentleman who presents with iron  deficiency anemia.  No evidence of gastrointestinal bleeding by history.  He certainly could have an occult gastrointestinal lesion such as a  marginal ulcer, arteriovenous malformations or neoplasia with associated  blood loss; however, it is also quite likely that he has iron deficiency  as a consequence of his bypass surgery, with impaired iron absorption.  This is not uncommon.   RECOMMENDATIONS:  1. Obtain follow-up CBC today, to see if he has had any response to      oral iron therapy.  2. Schedule a colonoscopy and upper endoscopy, to evaluate the      gastrointestinal mucosa.  The nature of the procedure, as well as      the risks, benefits and alternatives were discussed in detail.  He      understood and agreed to proceed.   3. Continue iron therapy for now.  4. Ongoing general medical care with Dr. Barry Dienes. [place].     Wilhemina Bonito. Marina Goodell, MD  Electronically Signed    JNP/MedQ  DD: 08/24/2007  DT: 08/24/2007  Job #: [id number]   cc:   Barry Dienes. Eloise Harman, M.D.  Blenda Nicely. [employer]

## 2011-02-19 NOTE — Op Note (Signed)
NAMEWILLAIM, MODE NO.:  [id number]   MEDICAL RECORD NO.:  [id number]          PATIENT TYPE:  INP   LOCATION:  0011                         FACILITY:  [facility]   PHYSICIAN:  Ollen Gross, M.D.    DATE OF BIRTH:  [birth year]   DATE OF PROCEDURE:  03/21/2008  DATE OF DISCHARGE:                               OPERATIVE REPORT   PREOPERATIVE DIAGNOSIS:  Osteoarthritis right hip.   POSTOPERATIVE DIAGNOSIS:  Osteoarthritis right hip.   PROCEDURE:  Right total hip arthroplasty.   SURGEON:  Ollen Gross, M.D.   ASSISTANT:  Avel Peace, P.A.-C.   ANESTHESIA:  General.   ESTIMATED BLOOD LOSS:  600.   DRAINS:  Hemovac x1.   COMPLICATIONS:  None.   CONDITION:  Stable to recovery.   BRIEF CLINICAL NOTE:  Ms. Lange is 65 year old male with end-stage  arthritis of the right hip with progressively worsening pain and  dysfunction.  He presents now for right total hip arthroplasty.   PROCEDURE IN DETAIL:  After the successful administration of general  anesthetic, the patient was placed in left lateral decubitus position  with the right side up and held with the hip positioner.  Right lower  extremity was isolated from its perineum with plastic drapes and prepped  and draped in the usual sterile fashion.  Short posterolateral incision  was made with at 10 blade through the subcutaneous tissue to the level  of fascia lata which was incised in line with the skin incision.  The  sciatic nerve was palpated and protected and short rotators isolated off  the femur.  Capsulectomy was performed, and the hip was dislocated.  Center of femoral head was marked, and the trial prosthesis was placed  such that the center of trial head corresponded to center of his native  femoral head.  Osteotomy line was marked on the femoral neck, and  osteotomy made with an oscillating saw.  Femoral head was removed and  the femur retracted anteriorly to gain acetabular exposure.   Acetabular retractors were placed and labrum and osteophytes removed.  Acetabular reaming begins at 45 mm, coursing in increments of 2 to 55  mm.  Then, a 56-mm pinnacle acetabular shell was placed in anatomic  position and transfixed with two dome screws with excellent purchase.  The apex hole eliminator was placed, and then the permanent 40-mm  neutral Ultamet metal liner was placed for a metal-on-metal hip  replacement.   Femur was prepared with the canal finder and irrigation.  Axial reaming  was performed up to 15.5 mm, proximal reaming to 20D and the sleeve  machined to a large.  A 20D large trial sleeve was placed with a 20 x 15  stem and 36 standard neck.  The neck was about 10 degrees beyond his  native anteversion.  A 40 plus 0 head was placed and the hips reduced  with excellent stability.  He has got full extension and full external  rotation to 70 degrees of flexion, 40 degrees of adduction, 90 degrees  internal rotation and 90 degrees of flexion, 70  degrees of internal  rotation.  By placing the right leg on top of the left, it was felt as  though the leg lengths were equal.  The hips were then dislocated and  trials removed.  The permanent 20D large sleeve was placed with a 20 x  15 stem, 36 standard neck, 10 degrees beyond native anteversion.  A 36  plus 0 head was placed and hips reduced with the same stability  parameters.  Wounds were copiously irrigated with saline solution and  short rotators reattached to the femur through drill holes.  Fascia lata  was closed over a Hemovac drain with running #2 Quill.  Subcutaneous  tissue closed deep with running 0 Quill and superficially with  interrupted 2-0 Vicryl and subcuticular closed with running 4-0  Monocryl.  The incisions cleaned and dried, and Steri-Strips and bulky  sterile dressing applied.  The drains hooked to suction, and he was then  placed into a knee immobilizer, awakened and transferred to recovery in  stable  condition.      Ollen Gross, M.D.  Electronically Signed     FA/MEDQ  D:  03/21/2008  T:  03/21/2008  Job:  [id number]

## 2011-02-22 NOTE — Op Note (Signed)
[name], [name] House                  ACCOUNT NO.:  [id number]   MEDICAL RECORD NO.:  [id number]          PATIENT TYPE:  AMB   LOCATION:  NESC                         FACILITY:  [facility]   PHYSICIAN:  Valetta Fuller, M.D.  DATE OF BIRTH:  1946-03-06   DATE OF PROCEDURE:  07/12/2004  DATE OF DISCHARGE:                                 OPERATIVE REPORT   PREOPERATIVE DIAGNOSES:  1.  Left distal ureteral calculus.  2.  Nausea and vomiting.   POSTOPERATIVE DIAGNOSIS:  No ureteral calculus found.   PROCEDURE PERFORMED:  Cystoscopy, left retrograde pyelography, ureteroscopy.   SURGEON:  Valetta Fuller, M.D.   ANESTHESIA:  General.   INDICATIONS:  [name] House is a patient of mine who does have a history of  nephrolithiasis.  Approximately a month ago, he presented to the [facility]  emergency room with left flank pain.  At that time, he was diagnosed with a  3-4 mm stone in his left mid ureter.  That was additionally diagnosed on  KUB.  A CT scan in our office confirmed what appeared to be a 4 mm stone in  his left mid ureter, causing hydronephrosis.  I saw him back for followup  approximately 10 days ago.  He was doing reasonably well.  We recommended  continued attempts to try to spontaneously pass the stone.  Since I last saw  him, he has had a couple of episodes of significant discomfort.  He has also  had multiple episodes of nausea with ongoing emesis.  He has also been  increasingly lethargic.  When I saw him in followup the day before surgery,  the patient had no evidence of infection on urinalysis.  On KUB, there did  appear to be the continued presence of suspicious calcification in the area  of the distal left ureter.  The patient was not having much pain at that  time and was continuing to have fairly constant nausea and intermittent  emesis.  The stone appeared to have been present for approximately a month,  and at this point, we felt that intervention was indicated.  We  were  uncertain whether his nausea was ongoing due to this ureteral calculus or  whether potentially it was a result of the recent gastric bypass surgery he  had.  He presents now for evaluation and treatment.   TECHNIQUE/FINDINGS:  The patient was brought to the operating room, where he  had successful induction of general anesthesia.  He was placed in a  lithotomy position and prepped and draped in a usual manner.  On cystoscopy,  he had some trilobar hyperplasia.  Upon inserting the scope into his  bladder, there were two small 1-2 mm stone fragments in his bladder.  The  left hemitrigone looked somewhat inflamed, consistent with a distal ureteral  calculus; however, on retrograde pyelography, there was no evidence of any  obstruction and no evidence of hydronephrosis.  We did not see an obvious  filling defect, but I was concerned that we might be missing a  nonobstructing stone.  A guidewire was  placed to the left renal pelvis, and  direct visual ureteroscopy of the entire left ureter to the proximal aspect  was performed.  One could see some mucosal edema and inflammation in the  distal ureter, consistent with a stone at one time, but there was no stone  noted in the ureter.  For that reason, we did not  place a stent and felt there was spontaneous passage of the stone at some  point.  The patient will need to follow up with Dr. Daphine House with regard to  his ongoing nausea, which does not at this point appear to be secondary to  ureteral calcification.  He was brought to the recovery room in stable  condition in stable condition.      DSG/MEDQ  D:  07/13/2004  T:  07/13/2004  Job:  [id number]   cc:   [place] [name] House, M.D.  [address], [address]  [place]  [place] [address]  Fax: [phone]

## 2011-02-22 NOTE — Cardiovascular Report (Signed)
NAME:  [name] House, [name] House NO.:  [id number]   MEDICAL RECORD NO.:  [id number]                   PATIENT TYPE:  OIB   LOCATION:  6501                                 FACILITY:  [facility]   PHYSICIAN:  Peter M. [place]azil[place], M.D.               DATE OF BIRTH:  [birth year]   DATE OF PROCEDURE:  DATE OF DISCHARGE:  04/13/2004                              CARDIAC CATHETERIZATION   INDICATION FOR PROCEDURE:  A 65 year old white male being evaluated for  gastric bypass surgery for severe morbid obesity.  He has multiple cardiac  risk factors including diabetes, hypertension, hyperlipidemia.  Adenosine  Cardiolite study was suggested with a small area of anterior wall ischemia.   PROCEDURE:  1. Left heart catheterization.  2. Coronary and left ventricular angiography.   ACCESS:  Access is via the right femoral artery using standard Seldinger  technique.   EQUIPMENT:  A 4 French, 4-cm, right-and-left Judkins catheter.  A 4 French  pigtail catheter.  A 4 French arterial sheath.   MEDICATIONS:  Local anesthesia with 1% Xylocaine.  Contrast 100 cc of  Omnipaque.   HEMODYNAMIC DATA:  1. Aortic pressures is 115/65 with a mean of 86.  2. Left ventricle pressure is 120 with an EDP of 8 mmHg.   ANGIOGRAPHIC DATA:  1. The left coronary artery arises and distributes normal.  2. The left main coronary artery appears normal.  3. The left anterior descending artery has a 10% narrowing at the ostium.     Otherwise, the vessel appears normal.  4. The left circumflex is a very large vessel.  It gives rise to 2 marginal     vessels.  The first marginal vessel is relatively small, but appears     normal.  The second marginal is very large and is normal.  5. The right coronary artery has a upwards takeoff with a shepherd's crook     in the proximal vessel.  It is a dominant vessel and is normal.   LEFT VENTRICULAR ANGIOGRAPHY:  The left ventricular angiography was  performed in the  RAO view.  This demonstrates normal left ventricular size  and contractility with normal systolic function.  The ejection fraction is  estimated at 60%.  There is no mitral regurgitation or prolapse.   FINAL INTERPRETATION:  1. Normal coronary anatomy.  2. Normal left ventricular function.   PLAN:  The patient is cleared for gastric bypass surgery.                                               Peter M. [place]azil[place], M.D.    PMJ/MEDQ  D:  04/13/2004  T:  04/14/2004  Job:  [id number]   cc:   [place] Daphine Deutscher, M.D.  [address], [address]  [place]  [place] [address]  Fax: [phone]   Reuel Boom M.D. Jarold Motto

## 2011-02-22 NOTE — Op Note (Signed)
NAME:  [name] House, [name] House                            ACCOUNT NO.:  [id number]   MEDICAL RECORD NO.:  [id number]                   PATIENT TYPE:  AMB   LOCATION:  ENDO                                 FACILITY:  [facility]   PHYSICIAN:  Sandria Bales. Ezzard Standing, M.D.               DATE OF BIRTH:  December 18, 1945   DATE OF PROCEDURE:  05/08/2004  DATE OF DISCHARGE:                                 OPERATIVE REPORT   PREOPERATIVE DIAGNOSIS:  Questionable duodenal polyp, per upper  gastrointestinal.   POSTOPERATIVE DIAGNOSIS:  Approximate 7-8 mm polyp in duodenal bulb and bile  reflux.   PROCEDURE:  Esophagogastroduodenoscopy with biopsy and coagulation of  duodenal polyp.   SURGEON:  Sandria Bales. Ezzard Standing, M.D.   ANESTHESIA:  Versed 6 mg, Demerol 50 mg of Demerol.   COMPLICATIONS:  None.   PROCEDURE:  [name] is a 65 year old morbidly obese white male who is  anticipating gastric bypass surgery in approximately one week by Dr. Daphine Deutscher  for his morbid obesity.  An upper GI was performed, which showed a polyp in  his duodenal bulb, and he now comes for endoscopy to evaluate this.  The  patient understands that post surgery this anatomy of his intestines may be  very difficult to reach or to examine.  The back of the throat was  anesthetized with Cetacaine.  He was given 50 mg of Demerol, 6 mg of Versed.  A flexible Olympus endoscope was passed without difficulty down the throat  into his stomach.  He was noted to have a large amount of bile in his  stomach.  The stomach mucosa itself looked fairly benign-appearing, but I  was just surprised at the amount of bile in his stomach.  I was able to  cannulate the pylorus.  Immediately beyond the pylorus, there was a probably  6-8 mm polyp which was actually fairly firm.  There was overlying mucosa,  which appeared normal, raising the question as to whether this may be some  kind of ectopic pancreatic tissue or lipoma, although it seemed firmer than  what a lipoma  would be.  I was able to get to the second portion of the  duodenum, but it was hard to advance the scope, because his stomach really  __________ on itself.  I saw no other growth or abnormality within the  duodenum.  Again, the polyp was biopsied and burned, probably six times,  using hot biopsy forceps.  The scope was then withdrawn through the stomach  and retroflexed.  Again, the outside of the bowel reflux, there was no other  mass or nodule within the stomach . The GE junction was at about 43-44 cm  with his distal esophagus being unremarkable.   The patient tolerated the procedure well.  I talked to his wife at the end  of the procedure.  They are to check with [place] in 2-3 days  for pathology  report.                                               Sandria Bales. Ezzard Standing, M.D.    DHN/MEDQ  D:  05/08/2004  T:  05/08/2004  Job:  [id number]   cc:   Thornton Park Daphine Deutscher, M.D.  [address]  [place]  [place] [address]  Fax: [phone]   Barry Dienes. Eloise Harman, M.D.  [address]  [place]  [place] [address]  Fax: [phone]

## 2011-02-22 NOTE — Op Note (Signed)
NAME:  [name] House, [name] House                            ACCOUNT NO.:  [id number]   MEDICAL RECORD NO.:  [id number]                   PATIENT TYPE:  INP   LOCATION:  0001                                 FACILITY:  [facility]   PHYSICIAN:  Thornton Park. Daphine Deutscher, M.D.             DATE OF BIRTH:  [birth year]   DATE OF PROCEDURE:  05/15/2004  DATE OF DISCHARGE:                                 OPERATIVE REPORT   PREOPERATIVE DIAGNOSES:  1. Morbid obesity with body mass index greater than 40.  2. Type 2 diabetes mellitus.  3. Hypertension.  4. Hypercholesterolemia.  5. History of kidney stones.  6. Giant gallstone with chronic cholecystitis.  7. Nine prior knee and ankle surgeries.  8. Osteoarthritis of the hip.   POSTOPERATIVE DIAGNOSES:  1. Morbid obesity with body mass index greater than 40.  2. Type 2 diabetes mellitus.  3. Hypertension.  4. Hypercholesterolemia.  5. History of kidney stones.  6. Giant gallstone with chronic cholecystitis.  7. Nine prior knee and ankle surgeries.  8. Osteoarthritis of the hip.   PROCEDURE:  1. Laparoscopic Roux-en-Y gastric bypass (45 cm biliopancreatic limb and 100     cm Roux limb).  2. Laparoscopic cholecystectomy with intraoperative cholangiogram (5.6 cm     gallstone that required 1-1/2 hour dissection at the end of the case).   SURGEON:  Luretha Murphy, M.D.   ASSISTANTS:  1. Danna Hefty, M.D., gastric bypass and upper endoscopy at the     completion of the gastrojejunostomy.  2. Glenna Fellows, M.D., cholecystectomy.   DESCRIPTION OF PROCEDURE:  [name] House was taken to room 1 on May 15, 2004.  Preoperatively, we had discussed the addition of a cholecystectomy  to this procedure, and I told him that it might add some morbidity because  it may lengthen the case, but he really wanted [place] to try to do this.  After  he was asleep and the abdomen had been prepped with Betadine, I entered  through a left subcostal incision using an  OptiView technique and entered  without difficulty and inflated the abdomen.  Two 12s were placed over on  the right side and a 10-11 on the left side just above the umbilicus.  I  surveyed an area over near his cecum where he had some anterior adhesions,  and he had never had previous surgery noted, but we went over and looked at  it, and I could not see any evidence of a neoplastic process, felt it could  have been old diverticular disease.  It was more in the ascending colon  region.   The gallbladder was found to be kind of walled off and had been chronically  inflamed.   First we elevated the omentum.  Fortunately, [name] had lost about 23  pounds on his preoperative diet, and I think this helped his liver shrink  and his omental mass  as despite losing this, he was still pretty big.  We  elevated the omentum, identified the ligament of Treitz and then went distal  to that some 45 cm dividing the bowel at that location.  I divided it at 45,  as it looked like that would go up easier.  After dividing it, I put a  Penrose drain on the Roux side and then measured 100 cm.  To divide the  bowel, I used actually two firings of the EndoGIA, went down into the  mesentery slightly.  No bleeding was noted.  I then created the side-to-side  jejunojejunostomy, aligning the bowel at the marked areas and then placing a  marked suture to hold it on the antimesenteric borders.  I opened, created a  common defect with the EndoGIA, and then closed that with two sutures of 2-0  Vicryl.  This was tied in the middle, and it seemed to be a nice, snug  closure.  Tisseal was placed on this area.  I also closed the mesentery with  a running 2-0 silk.  Laparo-Ty's  were used on either ends of that.   I then placed a Nathanson retractor in the upper abdomen.  I also divided  the omentum which I thought might make this come up a little easier.   Next, we began the dissection around the proximal foregut  and dissected  along the cardia of the stomach, and I found there was a lot of fat over  that, and it was obscured somewhat, but I was able to make some inroads into  that area using the blunt finger dissector.  We then measured about 4.5 cm  along the lesser curvature and then went in that area, dividing the stomach  perpendicular to the lesser curve and then creating a pouch with multiple  firings of the EndoGIA and finally heading cephalad.  When this was done, we  did have an Ewald tube in place.  Last firing, I divided the stomach  entirely near the diaphragm.  There was some bleeding on the gastric remnant  which was controlled with clips.  Tisseal was placed on both staple lines  which appeared intact.   Next, the Roux limb was brought up with the candy-cane to the right and was  sutured along the staple line posteriorly with a running 2-0 Vicryl, tying  the end on the left side and then putting a Laparo-Ty on the right side.  We  then used the Ewald tube to help [place] open the pouch, and I opened both the  pouch and the stomach and then put an EndoGIA in for firing, creating the  common channel.  I inspected the anastomosis, and it was not bleeding.  This  otomy was closed with 2-0 Vicryl from either end and tied in the middle.  I  tested it, and there was one little area that looked a little slack on the  left side.  I went ahead and put a horizontal mattress, again using the  separate 2-0 Vicryl, and this completed that layer.  We passed some gas down  through the Ewald tube, submerging the first closure, and no bleed was  noted.   The anterior layer was then buttressed with a second layer of serosal  sutures placed with the free suture needle of 2-0 Vicryl using a Laparo-Ty  at both ends.  Endoscopy was then performed.  Dr. Luan Pulling passed the  endoscope, and I submerged the anastomosis, we inflated, and  there were no air bubbles or evidence of leak.  The endoscope was  withdrawn.  She will  dictate that in a separate note.   Some Tisseal was applied to the anastomosis proximally.  No bleeding was  noted.   Next, Dr. Johna Sheriff scrubbed in, and we began taking down this chronically  inflamed gallbladder containing a 5.6 cm gallstone.  Using the harmonic  scalpel, I took down many omental adhesions and dissected down to the cystic  duct.  I put a clip on the gallbladder side and incised that and inserted  the Reddick catheter which went into the common duct.  I then did a  cholangiogram showing proximal filling and then distal filling and flow into  the duodenum.  The cystic duct was then double-clipped, divided, and all the  vessels on the way out were clipped as well as the harmonic scalpel was used  to divide these.  I did not enter the gallbladder in removing it but once  detached, I placed it in a bag.  Because of the monster size of this stone,  I had to enlarge one of the port sites and then was able to get down and  over about 30 minutes we were able to crush this stone.  Finally, we were  able to get it out.  There was no spillage of either stone or bile.  I then  inserted my finger into this dilated hole and using direct laparoscopic  vision, used the Endoclose to place two sutures of 0 Vicryl through the  fascia and tying these down to close that defect.   A Jackson-Pratt drain was brought out through the left lateral port, placed  beside the anastomosis and then the gallbladder bed.  I then inspected all  the other anastomotic sites, and everything was intact, dry, and looked  good.  The abdomen was deflated, and all the port sites were removed.  The  wounds were closed with 4-0 Vicryl.  I did have to enlarge my incision in  the upper midline where the Nathanson retractor was to control a little  superficial hemorrhage.  After closing with 4-0 Vicryl, I then used staples  on the skin.  The patient seemed to tolerate the procedure well and  was  taken to the recovery room in satisfactory condition.                                               Thornton Park Daphine Deutscher, M.D.    MBM/MEDQ  D:  05/15/2004  T:  05/15/2004  Job:  [id number]   cc:   Peter M. [place], M.D.  [address]  [place], [place] [address]  Fax: [phone]   Barry Dienes. Eloise Harman, M.D.  [address]  [place]  [place] [address]  Fax: [phone]

## 2011-02-22 NOTE — Op Note (Signed)
NAME:  [name] House, [name] House                            ACCOUNT NO.:  [id number]   MEDICAL RECORD NO.:  [id number]                   PATIENT TYPE:  INP   LOCATION:  0001                                 FACILITY:  [facility]   PHYSICIAN:  Vikki Ports, M.D.         DATE OF BIRTH:  [birth year]   DATE OF PROCEDURE:  05/15/2004  DATE OF DISCHARGE:                                 OPERATIVE REPORT   PREOPERATIVE DIAGNOSIS:  Morbid obesity.   POSTOPERATIVE DIAGNOSIS:  Morbid obesity.   PROCEDURE:  Upper endoscopy.   SURGEON:  Vikki Ports, M.D.   DESCRIPTION OF PROCEDURE:  At the completion of laparoscopic Roux-en-Y  gastric bypass by Dr. Luretha Murphy, I introduced an Olympus endoscope in  through the oropharynx and down through the esophagus.  It is approximately  48 cm from the incisors.  The Z line was visualized.  The pouch, which  measured about 5 cm, was intact.  The anastomosis was patent.  There was no  evidence of leak.  Air was aspirated out, and the endoscope was removed.                                               Vikki Ports, M.D.    KRH/MEDQ  D:  05/15/2004  T:  05/15/2004  Job:  [id number]

## 2011-02-22 NOTE — Discharge Summary (Signed)
NAME:  [name] House, [name] House                            ACCOUNT NO.:  [id number]   MEDICAL RECORD NO.:  [id number]                   PATIENT TYPE:  INP   LOCATION:  0463                                 FACILITY:  [facility]   PHYSICIAN:  Thornton Park. Daphine Deutscher, M.D.             DATE OF BIRTH:  13-Aug-1946   DATE OF ADMISSION:  05/15/2004  DATE OF DISCHARGE:  05/19/2004                                 DISCHARGE SUMMARY   ADMISSION DIAGNOSES:  1. Morbid obesity.  2. A 5.6 cm gallstone.   PROCEDURE:  Laparoscopy Roux-en-Y gastric bypass with laparoscopic  cholecystectomy and intraoperative cholangiogram.   HOSPITAL COURSE:  [name] House is a 65 year old white male who was admitted on  May 15, 2004 for the above mentioned operation.  He underwent that on  August 9 and was taken to the intensive care unit postoperatively where he  was observed initially and then longer, since there were no beds up on the  fourth floor for him to go to.  He was given oral contrast swallow the  following day after surgery which showed a good anastomosis and no evidence  of a leak and he had DVT studies which were negative for clots.  He was  ready for discharge on May 19, 2004 at which time he had no complaints  and stable vital signs, and was discharged home by Dr. Jamey Ripa to be followed  up in the office for drain removal the following week.   FINAL DIAGNOSES:  1. Morbid obesity.  2. Gall stones status post laparoscopic cholecystectomy.  3. Laparoscopic Roux-en-Y gastric bypass.                                               Thornton Park Daphine Deutscher, M.D.    MBM/MEDQ  D:  05/24/2004  T:  05/25/2004  Job:  [id number]

## 2011-02-22 NOTE — H&P (Signed)
NAME:  [name] House, [name] House NO.:  [id number]   MEDICAL RECORD NO.:  [id number]                   PATIENT TYPE:  AMB   LOCATION:                                       FACILITY:  [facility]   PHYSICIAN:  Peter M. Swaziland, M.D.               DATE OF BIRTH:  1945/11/15   DATE OF ADMISSION:  04/13/2004  DATE OF DISCHARGE:                                HISTORY & PHYSICAL   HISTORY OF PRESENT ILLNESS:  [name] House is a 65 year old white male who was  seen for preoperative assessment for gastric bypass surgery.  He has a  history of non-insulin-dependent diabetes mellitus, hypertension,  hypercholesterolemia.  He has morbid obesity.  He has a family history of  early coronary disease.  He denies any history of cardiac complaints,  specifically denied any chest pain.  He does have some chronic shortness of  breath which he has associated with his obesity.  He has been limited in  activity due to severe hip problems and has had nine prior knee and ankle  surgeries.  He recently underwent an adenosine Cardiolite study.  His images  showed a small area of ischemia involving the distal anterior wall.  Left  ventricular function was normal with ejection fraction of 70%.  Given the  uncertainty of his cardiac diagnosis, it is now recommended he undergo  cardiac catheterization.   PAST MEDICAL HISTORY:  1. Diabetes mellitus type 2.  2. Hypertension.  3. Hypercholesterolemia.  4. Kidney stones.  5. Multiple knee and ankle surgeries.  6. Osteoarthritis of the hip.  7. Rash.   ALLERGIES:  No known drug allergies.   CURRENT MEDICATIONS:  Avandia once a day,  Allopurinol daily, Lipitor daily,  and hydrocodone p.r.n.   SOCIAL HISTORY:  The patient works for [employer].  He is  retired from [employer] and [employer]. He denies tobacco  use and drinks an occasional beer.  He is married and has one child.   FAMILY HISTORY:  His father died at age  32 of myocardial infarction, mother  died at age 41 with complications of diabetes.  One brother died of unknown  causes.   REVIEW OF SYMPTOMS:  The patient has been losing weight, losing  approximately 25 pounds this past year.  He has chronic hip pain.  Denies  orthopnea, PND.  No known history of sleep apnea.  Other review of systems  are negative.   PHYSICAL EXAMINATION:  GENERAL APPEARANCE:  The patient is morbidly obese  white male in no apparent distress.  VITAL SIGNS:  Blood pressure is 126/70, pulse 70 and regular.  HEENT:  Unremarkable.  NECK:  He has no JVD or bruits.  LUNGS:  Clear.  CARDIOVASCULAR:  Regular rate and rhythm without gallops, murmurs, rubs or  clicks.  ABDOMEN:  His abdomen is morbidly obese, soft and nontender.  EXTREMITIES:  His extremities are without edema.  He has pulses 2+ and  symmetric.  NEUROLOGIC:  Intact.   LABORATORY DATA:  Resting ECG shows normal sinus rhythm with incomplete  right bundle branch block.  Prior echocardiogram  showed mild concentric LVH  with normal systolic function, mild left atrial enlargement and mild aortic  valve sclerosis.  Pulmonary pressures were normal.   IMPRESSION:  1. Abnormal Cardiolite study suggesting a small area of anterior wall     ischemia.  2. Diabetes mellitus type 2.  3. Hypertension.  4. Hypercholesterolemia.  5. Morbid obesity.  6. Family history of early coronary disease.   PLAN:  Will proceed with coronary angiography with further therapy pending  these results.                                                Peter M. Swaziland, M.D.    PMJ/MEDQ  D:  04/10/2004  T:  04/10/2004  Job:  [id number]   cc:   Barry Dienes. Eloise Harman, M.D.  [address]  [place]  [place] [address]  Fax: [phone]   Thornton Park. Daphine Deutscher, M.D.  [address]  [place]  [place] [address]  Fax: [phone]

## 2011-06-04 ENCOUNTER — Telehealth: Payer: Self-pay | Admitting: Internal Medicine

## 2011-06-04 NOTE — Telephone Encounter (Signed)
Left message for [name] House to call back.  

## 2011-06-04 NOTE — Telephone Encounter (Signed)
[name] House pt, Dx of Iron def. Anemia. Last OV 12/08. Previous colons 12/08, 8/09, and 8/10. Pt having problems with abdominal pain, Dr. Jarold Motto requesting pt be seen. Pt scheduled to see Mike Gip PA 06/05/11 @9am . Malachi Bonds to notify pt of appt date and time and fax records.

## 2011-06-05 ENCOUNTER — Ambulatory Visit (INDEPENDENT_AMBULATORY_CARE_PROVIDER_SITE_OTHER): Payer: BC Managed Care – PPO | Admitting: Physician Assistant

## 2011-06-05 ENCOUNTER — Encounter: Payer: Self-pay | Admitting: Physician Assistant

## 2011-06-05 VITALS — BP 140/72 | HR 76 | Ht 72.0 in | Wt 244.8 lb

## 2011-06-05 DIAGNOSIS — R1031 Right lower quadrant pain: Secondary | ICD-10-CM

## 2011-06-05 DIAGNOSIS — Z86010 Personal history of colonic polyps: Secondary | ICD-10-CM

## 2011-06-05 DIAGNOSIS — I2510 Atherosclerotic heart disease of native coronary artery without angina pectoris: Secondary | ICD-10-CM

## 2011-06-05 DIAGNOSIS — Z9884 Bariatric surgery status: Secondary | ICD-10-CM

## 2011-06-05 DIAGNOSIS — E669 Obesity, unspecified: Secondary | ICD-10-CM

## 2011-06-05 MED ORDER — PEG-KCL-NACL-NASULF-NA ASC-C 100 G PO SOLR: ORAL | Status: DC

## 2011-06-07 ENCOUNTER — Encounter: Payer: Self-pay | Admitting: Physician Assistant

## 2011-06-07 DIAGNOSIS — I2510 Atherosclerotic heart disease of native coronary artery without angina pectoris: Secondary | ICD-10-CM | POA: Insufficient documentation

## 2011-06-07 DIAGNOSIS — Z9884 Bariatric surgery status: Secondary | ICD-10-CM | POA: Insufficient documentation

## 2011-06-07 NOTE — Progress Notes (Signed)
Subjective:    Patient ID: [name] House, male    DOB: 1946-03-07, 65 y.o.   MRN: [id number]  HPI Durant is a pleasant 65 year old white male known to Dr. 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 He also has non-insulin-dependent diabetes and coronary disease. His last colonoscopy was done 8/ 2010 he had left colon diverticulosis and no evidence of recurrent polyps at that time. Yet had colonoscopy a year previous to that with 3 polyps removed which were adenomatous. Last EGD done in December of 2008.  Patient comes in today with complaints of right lower quadrant pain over the past 3 months. He says the pain is not severe but has been persistent and intermittent. He also complains of a very gassy bloated feeling especially early in the mornings. He associates his lower bowel discomfort with spicy foods and postprandially. He has at no complaints of positional aggravation of his symptoms. He says bowel movements are always loose and usually has 2-3 stools every morning has not noted any melena or hematochezia. His appetite has been fine his weight has been stable.  Recent labs done about a week ago showed a platelet count of 39,000, WBC of 4.1 hemoglobin 13.8 hematocrit of 40.1 MCV of 92. As stated previously he does have history of chronic thrombocytopenia dating back several years his last platelet count in April 2012 was 50,000 in approximately 1 year ago was in the 70,000 range. He is followed by Dr. Clelia Croft  for oncology and has a known chronic splenomegaly.    Review of Systems  Constitutional: Negative.   HENT: Negative.   Eyes: Negative.   Respiratory: Negative.   Cardiovascular: Negative.   Gastrointestinal: Positive for abdominal pain, diarrhea and abdominal distention.  Genitourinary: Negative.   Musculoskeletal: Negative.   Skin: Negative.   Neurological:  Negative.   Hematological: Negative.   Psychiatric/Behavioral: Negative.     Outpatient Encounter Prescriptions as of 06/05/2011  Medication Sig Dispense Refill  . Cholecalciferol (VITAMIN D3) 2000 UNITS TABS Take 1 capsule by mouth daily.        Marland Kitchen lisinopril (PRINIVIL,ZESTRIL) 10 MG tablet Take 10 mg by mouth daily.        . methocarbamol (ROBAXIN) 500 MG tablet Take 500 mg by mouth 4 (four) times daily.        . Multiple Vitamin (MULTIVITAMIN) capsule Take 1 capsule by mouth daily.        . peg 3350 powder (MOVIPREP) 100 G SOLR Take as directed Moviprep  1 kit  0   No Known Allergies  Past history family history and social history reviewed and present in the chart Objective:   Physical Exam Well-developed obese white male in no acute distress, alert and oriented x3, pleasant, ;nontraumatic normocephalic EOMI PERRLA sclera anicteric Supple no JVD, Cardiovascula;r regular rate and rhythm with S1 and S2 are somewhat distant heart sounds, pulmonary; clear bilaterally, Abdomen ; large soft bowel sounds active, is mildly tender in the right lower quadrant, Rectal; not done, Extremities; no clubbing cyanosis or edema skin benign, Psych; mood and affect normal and appropriate.        Assessment & Plan:  #6 65 year old male that is post Roux-en-Y gastric bypass 2005, history of diverticular disease and adenomatous colon polyps with new complaint of right lower quadrant pain x3 months intermittent and worse postprandially. Patient with chronic loose stools post bypass. Etiology  of his pain is not clear; rule out occult lesion, rule out possible bacterial overgrowth  Plan; Schedule for colonoscopy with Dr. Marina Goodell, given previous history of multiple adenomatous polyps. Further workup with CT abdomen and pelvis if colonoscopy is unrevealing. Discussed thrombocytopenia with Dr. Marina Goodell, as this is a chronic process and weight lookout relatively stable, he is felt to be at a safe level for endoscopic  evaluation.  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  #4 Obesity

## 2011-06-24 ENCOUNTER — Other Ambulatory Visit: Payer: BC Managed Care – PPO | Admitting: Internal Medicine

## 2011-07-01 ENCOUNTER — Other Ambulatory Visit (INDEPENDENT_AMBULATORY_CARE_PROVIDER_SITE_OTHER): Payer: BC Managed Care – PPO

## 2011-07-01 ENCOUNTER — Other Ambulatory Visit: Payer: Self-pay | Admitting: *Deleted

## 2011-07-01 ENCOUNTER — Ambulatory Visit (AMBULATORY_SURGERY_CENTER): Payer: BC Managed Care – PPO | Admitting: Internal Medicine

## 2011-07-01 ENCOUNTER — Encounter: Payer: Self-pay | Admitting: Internal Medicine

## 2011-07-01 VITALS — HR 63 | Temp 98.5°F | Resp 18 | Ht 73.5 in | Wt 242.0 lb

## 2011-07-01 DIAGNOSIS — Z1389 Encounter for screening for other disorder: Secondary | ICD-10-CM

## 2011-07-01 DIAGNOSIS — K5730 Diverticulosis of large intestine without perforation or abscess without bleeding: Secondary | ICD-10-CM

## 2011-07-01 DIAGNOSIS — R1031 Right lower quadrant pain: Secondary | ICD-10-CM

## 2011-07-01 DIAGNOSIS — Z1211 Encounter for screening for malignant neoplasm of colon: Secondary | ICD-10-CM

## 2011-07-01 DIAGNOSIS — Z860100 Personal history of colon polyps, unspecified: Secondary | ICD-10-CM

## 2011-07-01 LAB — CREATININE, SERUM: Creatinine, Ser: 1 mg/dL (ref 0.4–1.5)

## 2011-07-01 LAB — BUN: BUN: 8 mg/dL (ref 6–23)

## 2011-07-01 MED ORDER — SODIUM CHLORIDE 0.9 % IV SOLN: 500.0000 mL | INTRAVENOUS | Status: DC

## 2011-07-01 NOTE — Patient Instructions (Signed)
See the picture page for your findings from your exam today.  Follow the green and blue discharge instruction sheets the rest of the day.  Resume your prior medications today. The 3rd floor nurse will call you to set up an appointment to have a CT of your abdomen and pelvis.  Please call within the next couple of days to the 3rd floor to schedule an appointment to follow up with Dr. Marina Goodell after the CT is completed.  Please call if any questions or concerns.

## 2011-07-01 NOTE — Progress Notes (Signed)
The pt was instructed to fill out the CT instructions when the 3rd floor nurse calls with the appointment.  The pt was instructed to call 3rd floor back to schedule an appointment for coulpe of weeks after the CT is completed. MAW  Two bottles of Redicat contrast given to the pt's wife and the CT instructions sheet to be filled out when scheduled. MAW  NO complaints on discharge.  MAW  Pt has not had BUN and Creatine with in 3 monthes.  3rd floor nurse put order in the computer for the pt to have BUN and Creatine drawn today on discharge. MAW

## 2011-07-02 ENCOUNTER — Telehealth: Payer: Self-pay

## 2011-07-02 ENCOUNTER — Telehealth: Payer: Self-pay | Admitting: *Deleted

## 2011-07-02 ENCOUNTER — Other Ambulatory Visit: Payer: Self-pay | Admitting: Internal Medicine

## 2011-07-02 NOTE — Telephone Encounter (Signed)

## 2011-07-02 NOTE — Telephone Encounter (Signed)
Pt scheduled for CT abdomen and pelvis Friday 07/05/11 arrival time 7:45am, scan @8am . Pt has contrast and instructions. Pt aware of appt date and time.

## 2011-07-02 NOTE — Progress Notes (Signed)
Addended by: Hilarie Fredrickson on: 07/02/2011 09:12 AM   Modules accepted: Orders

## 2011-07-04 LAB — BASIC METABOLIC PANEL
BUN: 12
CO2: 27
Chloride: 105
Chloride: 105
Creatinine, Ser: 0.99
GFR calc Af Amer: >60
Glucose, Bld: 189 — ABNORMAL HIGH
Potassium: 3.9
Potassium: 4.3
Sodium: 135

## 2011-07-04 LAB — COMPREHENSIVE METABOLIC PANEL
ALT: 23
AST: 28
Alkaline Phosphatase: 103
CO2: 26
Calcium: 9.1
GFR calc Af Amer: >60
GFR calc non Af Amer: >60
Glucose, Bld: 132 — ABNORMAL HIGH
Potassium: 4.7
Sodium: 143
Total Protein: 5.9 — ABNORMAL LOW

## 2011-07-04 LAB — URINALYSIS, ROUTINE W REFLEX MICROSCOPIC
Bilirubin Urine: NEGATIVE
Glucose, UA: NEGATIVE
Hgb urine dipstick: NEGATIVE
Ketones, ur: NEGATIVE
Protein, ur: NEGATIVE

## 2011-07-04 LAB — CBC
HCT: 26.4 — ABNORMAL LOW
HCT: 27.1 — ABNORMAL LOW
HCT: 28.2 — ABNORMAL LOW
HCT: 28.9 — ABNORMAL LOW
Hemoglobin: 10.2 — ABNORMAL LOW
Hemoglobin: 10.2 — ABNORMAL LOW
Hemoglobin: 8.6 — ABNORMAL LOW
Hemoglobin: 8.9 — ABNORMAL LOW
Hemoglobin: 9.2 — ABNORMAL LOW
Hemoglobin: 9.5 — ABNORMAL LOW
MCHC: 32.5
MCHC: 32.8
MCHC: 32.9
MCV: 68.9 — ABNORMAL LOW
MCV: 70.8 — ABNORMAL LOW
MCV: 71.2 — ABNORMAL LOW
Platelets: 75 — ABNORMAL LOW
Platelets: 82 — ABNORMAL LOW
RBC: 4.66
RBC: 3.82 — ABNORMAL LOW
RBC: 4.06 — ABNORMAL LOW
RBC: 4.57
RDW: 22.1 — ABNORMAL HIGH
WBC: 3.4 — ABNORMAL LOW
WBC: 4.8
WBC: 4.8
WBC: 5.7

## 2011-07-04 LAB — TYPE AND SCREEN: Antibody Screen: NEGATIVE

## 2011-07-04 LAB — PREPARE PLATELET PHERESIS

## 2011-07-04 LAB — PROTIME-INR: Prothrombin Time: 21.1 — ABNORMAL HIGH

## 2011-07-04 LAB — ABO/RH: ABO/RH(D): O POS

## 2011-07-05 ENCOUNTER — Ambulatory Visit (HOSPITAL_COMMUNITY)
Admission: RE | Admit: 2011-07-05 | Discharge: 2011-07-05 | Disposition: A | Discharge status: Home / Self Care | Payer: BC Managed Care – PPO | Source: Ambulatory Visit | Attending: Internal Medicine | Admitting: Internal Medicine

## 2011-07-05 DIAGNOSIS — Z96649 Presence of unspecified artificial hip joint: Secondary | ICD-10-CM | POA: Insufficient documentation

## 2011-07-05 DIAGNOSIS — Q619 Cystic kidney disease, unspecified: Secondary | ICD-10-CM | POA: Insufficient documentation

## 2011-07-05 DIAGNOSIS — K766 Portal hypertension: Secondary | ICD-10-CM | POA: Insufficient documentation

## 2011-07-05 DIAGNOSIS — R188 Other ascites: Secondary | ICD-10-CM | POA: Insufficient documentation

## 2011-07-05 DIAGNOSIS — Z9884 Bariatric surgery status: Secondary | ICD-10-CM | POA: Insufficient documentation

## 2011-07-05 DIAGNOSIS — K7689 Other specified diseases of liver: Secondary | ICD-10-CM | POA: Insufficient documentation

## 2011-07-05 DIAGNOSIS — K7460 Unspecified cirrhosis of liver: Secondary | ICD-10-CM | POA: Insufficient documentation

## 2011-07-05 DIAGNOSIS — N200 Calculus of kidney: Secondary | ICD-10-CM | POA: Insufficient documentation

## 2011-07-05 MED ORDER — IOHEXOL 300 MG/ML  SOLN: 100.0000 mL | Freq: Once | INTRAMUSCULAR | Status: AC | PRN

## 2011-07-08 ENCOUNTER — Telehealth: Payer: Self-pay

## 2011-07-08 NOTE — Telephone Encounter (Signed)
Message copied by Michele Mcalpine on Mon Jul 08, 2011  8:36 AM ------      Message from: Hilarie Fredrickson      Created: Sun Jul 07, 2011 10:14 AM       Let pt know that CT shows some chronic liver problems and enlarged spleen. Nothing acute and no significant problems to explain right sided discomfort. Have him make a routine follow up appointment to see me to discuss.Send copy to his PCP, Dr. Dossie Arbour and his hematologist, Dr. Clelia Croft.

## 2011-07-08 NOTE — Telephone Encounter (Signed)
Pt aware, he has a follow-up appt 07/25/11@11 :15am. Results faxed to Dr. Brunilda Payor and Dr. Clelia Croft.

## 2011-07-19 ENCOUNTER — Other Ambulatory Visit: Payer: Self-pay | Admitting: Oncology

## 2011-07-19 ENCOUNTER — Encounter (HOSPITAL_BASED_OUTPATIENT_CLINIC_OR_DEPARTMENT_OTHER): Payer: BC Managed Care – PPO | Admitting: Oncology

## 2011-07-19 DIAGNOSIS — D696 Thrombocytopenia, unspecified: Secondary | ICD-10-CM

## 2011-07-19 DIAGNOSIS — D509 Iron deficiency anemia, unspecified: Secondary | ICD-10-CM

## 2011-07-19 DIAGNOSIS — Z9884 Bariatric surgery status: Secondary | ICD-10-CM

## 2011-07-19 DIAGNOSIS — D72819 Decreased white blood cell count, unspecified: Secondary | ICD-10-CM

## 2011-07-19 LAB — IRON AND TIBC
%SAT: 18 % — ABNORMAL LOW (ref 20–55)
TIBC: 376 ug/dL (ref 215–435)

## 2011-07-19 LAB — CBC WITH DIFFERENTIAL/PLATELET
BASO%: 0.3 % (ref 0.0–2.0)
Basophils Absolute: 0 10*3/uL (ref 0.0–0.1)
EOS%: 1.8 % (ref 0.0–7.0)
HCT: 38.9 % (ref 38.4–49.9)
HGB: 13.4 g/dL (ref 13.0–17.1)
LYMPH%: 15 % (ref 14.0–49.0)
MCH: 30.9 pg (ref 27.2–33.4)
MCHC: 34.5 g/dL (ref 32.0–36.0)
NEUT%: 77.8 % — ABNORMAL HIGH (ref 39.0–75.0)
Platelets: 48 10*3/uL — ABNORMAL LOW (ref 140–400)
lymph#: 0.4 10*3/uL — ABNORMAL LOW (ref 0.9–3.3)

## 2011-07-22 ENCOUNTER — Encounter: Payer: Self-pay | Admitting: *Deleted

## 2011-07-25 ENCOUNTER — Encounter: Payer: Self-pay | Admitting: Internal Medicine

## 2011-07-25 ENCOUNTER — Other Ambulatory Visit (INDEPENDENT_AMBULATORY_CARE_PROVIDER_SITE_OTHER): Payer: BC Managed Care – PPO

## 2011-07-25 ENCOUNTER — Ambulatory Visit (INDEPENDENT_AMBULATORY_CARE_PROVIDER_SITE_OTHER): Payer: BC Managed Care – PPO | Admitting: Internal Medicine

## 2011-07-25 VITALS — BP 140/76 | HR 88 | Ht 72.0 in | Wt 248.0 lb

## 2011-07-25 DIAGNOSIS — R1031 Right lower quadrant pain: Secondary | ICD-10-CM

## 2011-07-25 DIAGNOSIS — K766 Portal hypertension: Secondary | ICD-10-CM

## 2011-07-25 DIAGNOSIS — K7460 Unspecified cirrhosis of liver: Secondary | ICD-10-CM

## 2011-07-25 LAB — COMPREHENSIVE METABOLIC PANEL
AST: 32 U/L (ref 0–37)
Albumin: 4.4 g/dL (ref 3.5–5.2)
Alkaline Phosphatase: 106 U/L (ref 39–117)
BUN: 17 mg/dL (ref 6–23)
Calcium: 9.2 mg/dL (ref 8.4–10.5)
Chloride: 110 mEq/L (ref 96–112)
Glucose, Bld: 124 mg/dL — ABNORMAL HIGH (ref 70–99)
Potassium: 4.3 mEq/L (ref 3.5–5.1)
Sodium: 140 mEq/L (ref 135–145)
Total Protein: 6.9 g/dL (ref 6.0–8.3)

## 2011-07-25 LAB — HEPATITIS B SURFACE ANTIGEN: Hepatitis B Surface Ag: NEGATIVE

## 2011-07-25 LAB — AFP TUMOR MARKER: AFP-Tumor Marker: 3.6 ng/mL (ref 0.0–8.0)

## 2011-07-25 NOTE — Progress Notes (Signed)
HISTORY OF PRESENT ILLNESS:  [name] House is a 65 y.o. 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 He was last seen in this office 06/07/2011 regarding vague right lower quadrant discomfort of 3 months duration. He subsequently underwent complete colonoscopy with intubation of the terminal ileum on 07/01/2011. This revealed moderate sigmoid diverticulosis but was otherwise normal. Because of a history of multiple adenomas as previously described, followup in 3 years recommended. To further evaluate the discomfort, a CT scan of the abdomen and pelvis was ordered and performed 07/05/2011. This revealed hepatic cirrhosis with evidence of portal hypertension as manifested by paraumbilical and esophageal varices. Additionally, splenomegaly and mild ascites. He presents to the office today for followup, as requested. He is accompanied by his wife. The patient denies a personal or family history of liver disease. No prior history of transfusions or significant alcohol use. Maximal body weight was close to 400 pounds, which he states that for many many years. His current weight is 250 pounds. He denies difficulty with mentation, problems with fluid accumulation, or obvious GI bleeding. He did undergo an upper endoscopy in December of 2008 to evaluate microcytic anemia. This revealed evidence of prior gastric bypass, though no varices. His iron deficiency anemia was felt secondary to bypass physiology. He has been receiving parenteral iron. He is due for iron infusion tomorrow. His last hemoglobin, from last week was 13.4. His right-sided discomfort has improved. He describes it as gas, which she notices at  night while lying down. Previous blood work with normal protein, albumin, and bilirubin  REVIEW OF SYSTEMS:  All non-GI ROS negative.  Past Medical History  Diagnosis Date  . Diabetes mellitus, type 2     resolved with gastric bypass  . Hyperlipemia   . Kidney stones   . Osteoarthritis of hip   . Hypertension   . Benign neoplasm of colon   . Diverticulosis of colon (without mention of hemorrhage)   . Iron deficiency anemia secondary to blood loss (chronic)   . Unspecified sleep apnea   . Cirrhosis   . Liver cyst   . Renal cyst     Past Surgical History  Procedure Date  . Knee surgery   . Ankle surgery   . Gastric bypass 2005  . Total hip arthroplasty     Social History Yoan Sallade  reports that he has never smoked. He has never used smokeless tobacco. He reports that he drinks alcohol. He reports that he does not use illicit drugs.  family history includes Coronary artery disease in his brother; Diabetes in his mother; and Heart attack in his father.  There is no history of Colon cancer.  No Known Allergies     PHYSICAL EXAMINATION: Vital signs: BP 140/76  Pulse 88  Ht 6' (1.829 m)  Wt 248 lb (112.492 kg)  BMI 33.63 kg/m2  Constitutional: Obese, generally well-appearing, no acute distress Psychiatric: alert and oriented x3, cooperative Eyes: extraocular movements intact, anicteric, conjunctiva pink Mouth: oral pharynx moist, no lesions Neck: supple no lymphadenopathy Cardiovascular: heart regular rate and rhythm, no murmur Lungs: clear to auscultation bilaterally Abdomen: Obese, soft, nontender, nondistended, no obvious ascites, no peritoneal signs, normal bowel sounds, no obvious organomegaly (difficult to assess with body habitus) Extremities: no lower extremity edema  bilaterally Skin: no lesions on visible extremities Neuro: No focal deficits. No asterixis.     ASSESSMENT:  #1. 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  Currently compensated with intact synthetic function. #2. Multiple colonic polyposis. Recent colonoscopy as described. Repeat surveillance in 3 years #3. Status post Roux-en-Y gastric bypass #4. Iron deficiency anemia secondary to #3 #5. Multiple medical problems including obesity, sleep apnea, and now cirrhosis #6. The right lower quadrant discomfort. Improve. Negative workup. No further workup planned   PLAN:  #1. Long discussion today regarding chronic liver disease with an emphasis on etiologies, workup, problems associated with portal hypertension, and problems associated with impaired synthetic function or decompensation. Multiple questions asked and answered. #2. Comprehensive metabolic panel, prothrombin time to assess synthetic function #3. Viral and non-viral studies to assess for other potential causes for chronic liver disease #4. Upper endoscopy to assess for esophageal varices. CRNA monitored propofol sedation as with recent colonoscopy due to multiple comorbidities.The nature of the procedure, as well as the risks, benefits, and alternatives were carefully and thoroughly reviewed with the patient. Ample time for discussion and questions allowed. The patient understood, was satisfied, and agreed to proceed.  #5. Office followup after the above

## 2011-07-25 NOTE — Patient Instructions (Signed)
EGD with [facility] 08/23/11 10:30 am arrive at 9:30 am Labs ordered for you to have done today

## 2011-07-26 ENCOUNTER — Encounter (HOSPITAL_BASED_OUTPATIENT_CLINIC_OR_DEPARTMENT_OTHER): Payer: BC Managed Care – PPO | Admitting: Oncology

## 2011-07-26 DIAGNOSIS — D509 Iron deficiency anemia, unspecified: Secondary | ICD-10-CM

## 2011-07-26 LAB — CERULOPLASMIN: Ceruloplasmin: 23 mg/dL (ref 20–60)

## 2011-07-26 LAB — ANA: Anti Nuclear Antibody(ANA): NEGATIVE

## 2011-07-29 LAB — MITOCHONDRIAL ANTIBODIES: Mitochondrial M2 Ab, IgG: 0 (ref <0.91)

## 2011-08-02 ENCOUNTER — Encounter (HOSPITAL_BASED_OUTPATIENT_CLINIC_OR_DEPARTMENT_OTHER): Payer: BC Managed Care – PPO | Admitting: Oncology

## 2011-08-02 DIAGNOSIS — D509 Iron deficiency anemia, unspecified: Secondary | ICD-10-CM

## 2011-08-19 ENCOUNTER — Encounter (HOSPITAL_COMMUNITY): Payer: Self-pay | Admitting: *Deleted

## 2011-08-19 ENCOUNTER — Emergency Department (HOSPITAL_COMMUNITY): Payer: BC Managed Care – PPO

## 2011-08-19 ENCOUNTER — Emergency Department (HOSPITAL_COMMUNITY)
Admission: EM | Admit: 2011-08-19 | Discharge: 2011-08-19 | Disposition: A | Discharge status: Home / Self Care | Payer: BC Managed Care – PPO | Attending: Emergency Medicine | Admitting: Emergency Medicine

## 2011-08-19 DIAGNOSIS — R300 Dysuria: Secondary | ICD-10-CM | POA: Insufficient documentation

## 2011-08-19 DIAGNOSIS — R10811 Right upper quadrant abdominal tenderness: Secondary | ICD-10-CM | POA: Insufficient documentation

## 2011-08-19 DIAGNOSIS — E785 Hyperlipidemia, unspecified: Secondary | ICD-10-CM | POA: Insufficient documentation

## 2011-08-19 DIAGNOSIS — Z87442 Personal history of urinary calculi: Secondary | ICD-10-CM | POA: Insufficient documentation

## 2011-08-19 DIAGNOSIS — I10 Essential (primary) hypertension: Secondary | ICD-10-CM | POA: Insufficient documentation

## 2011-08-19 DIAGNOSIS — Z79899 Other long term (current) drug therapy: Secondary | ICD-10-CM | POA: Insufficient documentation

## 2011-08-19 DIAGNOSIS — M1610 Unilateral primary osteoarthritis, unspecified hip: Secondary | ICD-10-CM | POA: Insufficient documentation

## 2011-08-19 DIAGNOSIS — Z9889 Other specified postprocedural states: Secondary | ICD-10-CM | POA: Insufficient documentation

## 2011-08-19 DIAGNOSIS — R109 Unspecified abdominal pain: Secondary | ICD-10-CM | POA: Insufficient documentation

## 2011-08-19 DIAGNOSIS — M169 Osteoarthritis of hip, unspecified: Secondary | ICD-10-CM | POA: Insufficient documentation

## 2011-08-19 DIAGNOSIS — E119 Type 2 diabetes mellitus without complications: Secondary | ICD-10-CM | POA: Insufficient documentation

## 2011-08-19 DIAGNOSIS — N201 Calculus of ureter: Secondary | ICD-10-CM | POA: Insufficient documentation

## 2011-08-19 DIAGNOSIS — R209 Unspecified disturbances of skin sensation: Secondary | ICD-10-CM | POA: Insufficient documentation

## 2011-08-19 LAB — URINALYSIS, ROUTINE W REFLEX MICROSCOPIC
Bilirubin Urine: NEGATIVE
Glucose, UA: NEGATIVE mg/dL
Protein, ur: NEGATIVE mg/dL
Urobilinogen, UA: 0.2 mg/dL (ref 0.0–1.0)

## 2011-08-19 LAB — URINE MICROSCOPIC-ADD ON

## 2011-08-19 MED ORDER — HYDROMORPHONE HCL PF 2 MG/ML IJ SOLN
INTRAMUSCULAR | Status: AC
  Filled 2011-08-19: qty 1

## 2011-08-19 MED ORDER — OXYCODONE-ACETAMINOPHEN 5-325 MG PO TABS: 1.0000 | ORAL_TABLET | ORAL | Status: AC | PRN

## 2011-08-19 MED ORDER — HYDROMORPHONE HCL PF 2 MG/ML IJ SOLN
INTRAMUSCULAR | Status: AC
  Administered 2011-08-19: 1 mg via INTRAVENOUS
  Filled 2011-08-19: qty 1

## 2011-08-19 MED ORDER — ONDANSETRON HCL 4 MG/2ML IJ SOLN
INTRAMUSCULAR | Status: AC
  Administered 2011-08-19: 4 mg via INTRAVENOUS
  Filled 2011-08-19: qty 2

## 2011-08-19 MED ORDER — TAMSULOSIN HCL 0.4 MG PO CAPS: ORAL_CAPSULE | ORAL | Status: DC

## 2011-08-19 NOTE — Discharge Instructions (Signed)
°  Strain the urine to catch the stone. Call your urologist, for followup appointment. Take pain medicine regularly and try to pass the stone. Return here if needed for problems.   Kidney Stones Kidney stones (ureteral lithiasis) are deposits that form inside your kidneys. The intense pain is caused by the stone moving through the urinary tract. When the stone moves, the ureter goes into spasm around the stone. The stone is usually passed in the urine.  CAUSES   A disorder that makes certain neck glands produce too much parathyroid hormone (primary hyperparathyroidism).   A buildup of uric acid crystals.   Narrowing (stricture) of the ureter.   A kidney obstruction present at birth (congenital obstruction).   Previous surgery on the kidney or ureters.   Numerous kidney infections.  SYMPTOMS   Feeling sick to your stomach (nauseous).   Throwing up (vomiting).   Blood in the urine (hematuria).   Pain that usually spreads (radiates) to the groin.   Frequency or urgency of urination.  DIAGNOSIS   Taking a history and physical exam.   Blood or urine tests.   Computerized X-ray scan (CT scan).   Occasionally, an examination of the inside of the urinary bladder (cystoscopy) is performed.  TREATMENT   Observation.   Increasing your fluid intake.   Surgery may be needed if you have severe pain or persistent obstruction.  The size, location, and chemical composition are all important variables that will determine the proper choice of action for you. Talk to your caregiver to better understand your situation so that you will minimize the risk of injury to yourself and your kidney.  HOME CARE INSTRUCTIONS   Drink enough water and fluids to keep your urine clear or pale yellow.   Strain all urine through the provided strainer. Keep all particulate matter and stones for your caregiver to see. The stone causing the pain may be as small as a grain of salt. It is very important to use  the strainer each and every time you pass your urine. The collection of your stone will allow your caregiver to analyze it and verify that a stone has actually passed.   Only take over-the-counter or prescription medicines for pain, discomfort, or fever as directed by your caregiver.   Make a follow-up appointment with your caregiver as directed.   Get follow-up X-rays if required. The absence of pain does not always mean that the stone has passed. It may have only stopped moving. If the urine remains completely obstructed, it can cause loss of kidney function or even complete destruction of the kidney. It is your responsibility to make sure X-rays and follow-ups are completed. Ultrasounds of the kidney can show blockages and the status of the kidney. Ultrasounds are not associated with any radiation and can be performed easily in a matter of minutes.  SEEK IMMEDIATE MEDICAL CARE IF:   Pain cannot be controlled with the prescribed medicine.   You have a fever.   The severity or intensity of pain increases over 18 hours and is not relieved by pain medicine.   You develop a new onset of abdominal pain.   You feel faint or pass out.  MAKE SURE YOU:   Understand these instructions.   Will watch your condition.   Will get help right away if you are not doing well or get worse.  Document Released: 09/23/2005 Document Revised: 06/05/2011 Document Reviewed: 01/19/2010 [facility] Patient Information 2012 [place], [place].

## 2011-08-19 NOTE — ED Notes (Signed)
Patient is resting comfortably. 

## 2011-08-19 NOTE — ED Notes (Signed)
Pt dressed and ready for home. Wife at bedside.

## 2011-08-19 NOTE — ED Notes (Signed)
Coffee to pts wife. Pt asking about CT results.

## 2011-08-19 NOTE — ED Notes (Signed)
Saline Lock in right [facility] was d/c intact

## 2011-08-19 NOTE — ED Notes (Signed)
Family at bedside. 

## 2011-08-19 NOTE — ED Notes (Addendum)
Patient states that tonight at approximately 12am he began having right flank pain and experienced mild chills. Patient took no pain medication at home before coming to the ED. Positive hx of kidney stones. Patient states that he has had some pain the last few days but he didn't think that it was associated with kidney stones. Patient states that after 1600 today he noticed a decline in his urinary output. He states that it has not been discolored or had an odor. Patient states that his rectum is also burning and that he has experienced loose stools today. He denies and rectal bleeding. No tenderness in abdomen. Bowel sounds present all 4 quads. Pt also states that the end of his Penis is burning.

## 2011-08-19 NOTE — ED Provider Notes (Signed)
History     CSN: [id number] Arrival date & time: 08/19/2011  2:32 AM   First MD Initiated Contact with Patient 08/19/11 0539      Chief Complaint  Patient presents with  . Flank Pain    (Consider location/radiation/quality/duration/timing/severity/associated sxs/prior treatment) Patient is a 65 y.o. male presenting with flank pain. The history is provided by the patient and the spouse.  Flank Pain This is a new problem. The current episode started 6 to 12 hours ago. The problem occurs constantly. The problem has been gradually worsening. Pertinent negatives include no chest pain, no abdominal pain and no headaches. The symptoms are aggravated by nothing. The symptoms are relieved by nothing. He has tried nothing for the symptoms.   his last kidney stone was 10 years ago. He's had no associated nausea or vomiting, today. He had a transient tingling at the end of his penis which has resolved. He's had diarrhea today. The symptoms. Today are severe.  Past Medical History  Diagnosis Date  . Diabetes mellitus, type 2     resolved with gastric bypass  . Hyperlipemia   . Kidney stones   . Osteoarthritis of hip   . Hypertension   . Benign neoplasm of colon   . Diverticulosis of colon (without mention of hemorrhage)   . Iron deficiency anemia secondary to blood loss (chronic)   . Unspecified sleep apnea     Resolved  . Cirrhosis   . Liver cyst   . Renal cyst     Past Surgical History  Procedure Date  . Knee surgery   . Ankle surgery   . Gastric bypass 2005  . Total hip arthroplasty     Family History  Problem Relation Age of Onset  . Heart attack Father     deceased due to MI  . Diabetes Mother     deceased due to complications of DM  . Coronary artery disease Brother   . Colon cancer Neg Hx     History  Substance Use Topics  . Smoking status: Never Smoker   . Smokeless tobacco: Never Used  . Alcohol Use: Yes     very rare      Review of Systems    Cardiovascular: Negative for chest pain.  Gastrointestinal: Negative for abdominal pain.  Genitourinary: Positive for dysuria and flank pain.  Neurological: Negative for headaches.  All other systems reviewed and are negative.    Allergies  Review of patient's allergies indicates no known allergies.  Home Medications   Current Outpatient Rx  Name Route Sig Dispense Refill  . VITAMIN D3 2000 UNITS PO TABS Oral Take 1 capsule by mouth daily.      Marland Kitchen LISINOPRIL 10 MG PO TABS Oral Take 10 mg by mouth daily.      Marland Kitchen METHOCARBAMOL 500 MG PO TABS Oral Take 500 mg by mouth 4 (four) times daily.      Carma Leaven M PLUS PO TABS Oral Take 1 tablet by mouth daily.      Marland Kitchen VITAMIN B-12 100 MCG PO TABS Oral Take 50 mcg by mouth daily.      . OXYCODONE-ACETAMINOPHEN 5-325 MG PO TABS Oral Take 1 tablet by mouth every 4 (four) hours as needed for pain. 30 tablet 0  . TAMSULOSIN HCL 0.4 MG PO CAPS  1 q HS to aid stone passage 7 capsule 0    BP 133/65  Pulse 57  Temp(Src) 97.6 F (36.4 C) (Oral)  Resp 16  SpO2  95%  Physical Exam  Constitutional: He is oriented to person, place, and time. He appears well-developed and well-nourished.  HENT:  Head: Normocephalic and atraumatic.  Eyes: Conjunctivae and EOM are normal. Pupils are equal, round, and reactive to light.  Neck: Normal range of motion. Neck supple.  Cardiovascular: Normal rate.   Pulmonary/Chest: Effort normal and breath sounds normal.  Abdominal: Soft. Bowel sounds are normal. He exhibits no distension. There is tenderness (mild right upper quadrant tenderness. No palpable mass.).  Genitourinary:       No costal vertebral angle tenderness  Musculoskeletal: Normal range of motion. He exhibits no edema and no tenderness.  Neurological: He is alert and oriented to person, place, and time.  Skin: Skin is warm and dry.  Psychiatric: He has a normal mood and affect. His behavior is normal. Judgment normal.    ED Course  Procedures  (including critical care time)  Reevaluation: 9:04 AM his pain is controlled with a second, Dilaudid injection . Findings discussed with the patient. He relates is due to have an endoscopy in 3 days to followup on his liver and spleen abnormalities .    Labs Reviewed  URINALYSIS, ROUTINE W REFLEX MICROSCOPIC - Abnormal; Notable for the following:    Hgb urine dipstick MODERATE (*)    All other components within normal limits  URINE MICROSCOPIC-ADD ON - Abnormal; Notable for the following:    Bacteria, UA FEW (*)    All other components within normal limits   Ct Abdomen Pelvis Wo Contrast  08/19/2011  *RADIOLOGY REPORT*  Clinical Data: Right flank pain for several hours, with decreased urine output and microhematuria.  CT ABDOMEN AND PELVIS WITHOUT CONTRAST  Technique:  Multidetector CT imaging of the abdomen and pelvis was performed following the standard protocol without intravenous contrast.  Comparison: CT of the abdomen and pelvis performed 07/05/2011  Findings: Mild bibasilar atelectasis is noted.  There is a 3.8 cm cyst noted posteriorly within the right hepatic lobe.  Smaller hypodensities within the liver are too small to further characterize but may also reflect cysts.  The slightly nodular contour to the liver reflects cirrhosis.  The spleen remains enlarged, measuring 20.7 cm in length.  The patient is status post cholecystectomy, with clips noted at the gallbladder fossa.  The pancreas is unremarkable in appearance.  The adrenal glands are within normal limits.  Prominent gastric and esophageal varices are seen; there is prominence of the vasculature throughout the visualized mesentery.  There is moderate right-sided hydronephrosis, with marked right- sided perinephric stranding and fluid, and prominence of the right ureter to the level of an obstructing 0.5 x 0.4 cm stone in the distal right ureter, 1-2 cm above the right vesicoureteral junction.  Nonobstructing bilateral renal stones are  seen, measuring up to 0.7 cm in size.  These are larger on the right side.  Scattered right renal cysts are noted, measuring up to 8.9 cm at the upper pole of the right kidney.  Nonspecific left-sided perinephric stranding is noted; no left-sided hydronephrosis is seen.  No free fluid is identified.  The patient is status post gastric bypass surgery.  The gastric pouch and gastrojejunostomy are unremarkable in appearance.  The duodenum contains a small amount of fluid.  No acute vascular abnormalities are seen.  Minimal scattered calcification is noted along the branches of the abdominal aorta.  The appendix is normal in caliber, without evidence for appendicitis.  Diffuse diverticulosis is noted along the proximal sigmoid colon, without evidence of  diverticulitis.  The colon is otherwise unremarkable in appearance.  A small collection of fluid is noted at the umbilicus, measuring approximately 2.3 cm.  The bladder is mildly distended and grossly unremarkable in appearance.  The prostate is significantly enlarged, measuring 6.0 cm in transverse dimension, with scattered calcification.  No inguinal lymphadenopathy is seen.  No acute osseous abnormalities are identified.  The visualized portions of the patient's right hip total arthroplasty appear intact.  There is narrowing of the intervertebral disc space at L4- L5, with associated vacuum phenomenon and endplate sclerotic change.  IMPRESSION:  1.  Moderate right-sided hydronephrosis, with marked right-sided perinephric stranding and fluid, and an obstructing 0.5 x 0.4 cm stone in the distal right ureter, 1 to 2 cm above the right vesicoureteral junction. 2.  Nonobstructing bilateral renal stones seen, measuring up to 0.7 cm in size. 3.  Scattered right renal cysts, measuring up to 8.9 cm. 4.  Marked splenomegaly noted. 5.  Cirrhotic change again noted, with prominent gastric and esophageal varices; diffuse prominence of the mesenteric vasculature. 6.  Small hepatic  cysts noted. 7.  Status post gastric bypass surgery; gastrojejunostomy is unremarkable in appearance. 8.  Small collection of fluid noted at the umbilicus, measuring 2.3 cm. 9.  Diffuse diverticulosis along the proximal sigmoid colon, without evidence of diverticulitis. 10.  Degenerative change at L4-L5. 11.  Significantly enlarged prostate. 12.  Mild bibasilar atelectasis seen.  Original Report Authenticated By: Tonia Ghent, M.D.     1. Right ureteral stone       MDM  Relatively large right distal ureteral stone. This has a greater than 50% chance of passing. Patient's pain has been controlled, therefore, he can be discharged        Flint Melter, MD 08/19/11 [phone]

## 2011-08-23 ENCOUNTER — Ambulatory Visit (AMBULATORY_SURGERY_CENTER): Payer: BC Managed Care – PPO | Admitting: Internal Medicine

## 2011-08-23 ENCOUNTER — Encounter: Payer: Self-pay | Admitting: Internal Medicine

## 2011-08-23 VITALS — BP 138/65 | HR 66 | Temp 97.9°F | Resp 26 | Ht 72.0 in | Wt 248.0 lb

## 2011-08-23 DIAGNOSIS — K766 Portal hypertension: Secondary | ICD-10-CM

## 2011-08-23 DIAGNOSIS — I8500 Esophageal varices without bleeding: Secondary | ICD-10-CM

## 2011-08-23 DIAGNOSIS — R1031 Right lower quadrant pain: Secondary | ICD-10-CM

## 2011-08-23 DIAGNOSIS — K7460 Unspecified cirrhosis of liver: Secondary | ICD-10-CM

## 2011-08-23 MED ORDER — SODIUM CHLORIDE 0.9 % IV SOLN: 500.0000 mL | INTRAVENOUS | Status: DC

## 2011-08-23 NOTE — Patient Instructions (Signed)
Please refer to the neon green sheet for instructions regarding activity for the rest of today.  Soft diet given. Resume previous medications.

## 2011-08-23 NOTE — Progress Notes (Signed)
Py went to ed on Monday 08-19-11 for a kidney stone and pt states the large bruise to his right forearm is from a iv blow from the ed. Pt states he hadn't even noticed this large bruise until pointed out in admitting. Pt denies pain to this area. Iv started in his left hand due to this bruise because is so large.  ewm

## 2011-08-26 ENCOUNTER — Telehealth: Payer: Self-pay

## 2011-08-26 NOTE — Telephone Encounter (Signed)

## 2011-09-09 ENCOUNTER — Encounter: Payer: Self-pay | Admitting: Internal Medicine

## 2011-09-13 ENCOUNTER — Encounter: Payer: Self-pay | Admitting: Internal Medicine

## 2011-09-13 ENCOUNTER — Ambulatory Visit (INDEPENDENT_AMBULATORY_CARE_PROVIDER_SITE_OTHER): Payer: BC Managed Care – PPO | Admitting: Internal Medicine

## 2011-09-13 VITALS — BP 158/74 | HR 60 | Ht 73.0 in | Wt 243.0 lb

## 2011-09-13 DIAGNOSIS — K7460 Unspecified cirrhosis of liver: Secondary | ICD-10-CM

## 2011-09-13 DIAGNOSIS — K766 Portal hypertension: Secondary | ICD-10-CM

## 2011-09-13 DIAGNOSIS — D509 Iron deficiency anemia, unspecified: Secondary | ICD-10-CM

## 2011-09-13 DIAGNOSIS — Z860100 Personal history of colon polyps, unspecified: Secondary | ICD-10-CM

## 2011-09-13 DIAGNOSIS — E669 Obesity, unspecified: Secondary | ICD-10-CM

## 2011-09-13 NOTE — Patient Instructions (Signed)
Please follow up in one year 

## 2011-09-13 NOTE — Progress Notes (Signed)
HISTORY OF PRESENT ILLNESS:  [name] House is a 65 y.o. 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 He was seen in this office 06/07/2011 regarding vague right lower quadrant discomfort of 3 months duration. He subsequently underwent complete colonoscopy with intubation of the terminal ileum on 07/01/2011. This revealed moderate sigmoid diverticulosis but was otherwise normal. Because of a history of multiple adenomas as previously described, followup in 3 years recommended. To further evaluate the discomfort, a CT scan of the abdomen and pelvis was ordered and performed 07/05/2011. This revealed hepatic cirrhosis with evidence of portal hypertension as manifested by paraumbilical and esophageal varices. Additionally, splenomegaly and mild ascites. He was seen in followup 07/25/2011. His wife was present. The feeling was that his cirrhosis was most likely secondary to NASH. Supplemental viral and nonviral studies were ordered to assess for other potential causes for chronic liver disease. These return negative or normal. He also underwent screening upper endoscopy on 08/23/2011. This revealed grade 1 esophageal varices and evidence of prior gastric bypass surgery. He presents to the office today for followup, as requested. He is again accompanied by his wife. No clinical problems since his endoscopy.   REVIEW OF SYSTEMS:  All non-GI ROS negative except for cough and arthritis  Past Medical History  Diagnosis Date  . Diabetes mellitus, type 2     resolved with gastric bypass  . Hyperlipemia   . Kidney stones   . Osteoarthritis of hip   . Hypertension   . Benign neoplasm of colon   .  Diverticulosis of colon (without mention of hemorrhage)   . Iron deficiency anemia secondary to blood loss (chronic)   . Unspecified sleep apnea     Resolved  . Cirrhosis   . Liver cyst   . Renal cyst   . Esophageal varices     Past Surgical History  Procedure Date  . Knee surgery   . Ankle surgery   . Gastric bypass 2005  . Total hip arthroplasty   . Colonoscopy   . Polypectomy     Social History Naaman Curro  reports that he has never smoked. He has never used smokeless tobacco. He reports that he drinks alcohol. He reports that he does not use illicit drugs.  family history includes Coronary artery disease in his brother; Diabetes in his mother; and Heart attack in his father.  There is no history of Colon cancer.  No Known Allergies     PHYSICAL EXAMINATION: Vital signs: BP 158/74  Pulse 60  Ht 6\' 1"  (1.854 m)  Wt 243 lb (110.224 kg)  BMI 32.06 kg/m2  Constitutional: Obese. generally well-appearing, no acute distress Psychiatric: alert and oriented x3, cooperative Eyes: extraocular movements intact, anicteric, conjunctiva pink Mouth: oral pharynx moist, no lesions Neck: supple no lymphadenopathy Cardiovascular: heart regular rate and rhythm, no murmur Lungs: clear to auscultation bilaterally Abdomen: soft, obese, nontender, nondistended, no obvious ascites, no peritoneal signs, normal bowel sounds, no organomegaly Extremities: no lower extremity edema bilaterally Skin: no lesions on visible extremities Neuro: No focal deficits. No asterixis.    ASSESSMENT:  #1. 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 Evidence of portal hypertension on CT imaging. Currently with compensated disease.  #2. Adenomatous colon polyps (multiple) on multiple prior colonoscopies. Last evaluation September  2012. Followup in 3 years #3. Status post Roux-en-Y gastric bypass #4. History of iron deficiency anemia #5. Multiple medical problems   PLAN:  #1. Long discussion  today with the patient and his wife regarding hepatic cirrhosis. We discussed relationship between Viola cirrhosis. We discussed as he is currently clinically compensated, though his disease may progress with associated complications such as bleeding, ascites, and/or encephalopathy. #2. Surveillance upper endoscopy in 1 year #3. Surveillance colonoscopy in 3 years #4. Resume general medical care with Dr. Eloise Harman. Interval GI followup as needed

## 2012-01-06 ENCOUNTER — Telehealth: Payer: Self-pay | Admitting: Oncology

## 2012-01-06 NOTE — Telephone Encounter (Signed)
l/m with 4/11 appt   aom

## 2012-01-16 ENCOUNTER — Other Ambulatory Visit (HOSPITAL_BASED_OUTPATIENT_CLINIC_OR_DEPARTMENT_OTHER): Payer: Medicare Other | Admitting: Lab

## 2012-01-16 ENCOUNTER — Telehealth: Payer: Self-pay | Admitting: Oncology

## 2012-01-16 ENCOUNTER — Ambulatory Visit (HOSPITAL_BASED_OUTPATIENT_CLINIC_OR_DEPARTMENT_OTHER): Payer: Medicare Other | Admitting: Oncology

## 2012-01-16 VITALS — BP 140/79 | HR 80 | Temp 98.7°F | Ht 73.0 in | Wt 249.9 lb

## 2012-01-16 DIAGNOSIS — D696 Thrombocytopenia, unspecified: Secondary | ICD-10-CM

## 2012-01-16 DIAGNOSIS — D509 Iron deficiency anemia, unspecified: Secondary | ICD-10-CM

## 2012-01-16 DIAGNOSIS — D649 Anemia, unspecified: Secondary | ICD-10-CM

## 2012-01-16 LAB — CBC WITH DIFFERENTIAL/PLATELET
BASO%: 0.2 % (ref 0.0–2.0)
EOS%: 1.7 % (ref 0.0–7.0)
MCHC: 34.9 g/dL (ref 32.0–36.0)
MONO#: 0.2 10*3/uL (ref 0.1–0.9)
RBC: 4.33 10*6/uL (ref 4.20–5.82)
WBC: 4 10*3/uL (ref 4.0–10.3)
lymph#: 0.5 10*3/uL — ABNORMAL LOW (ref 0.9–3.3)
nRBC: 0 % (ref 0–0)

## 2012-01-16 LAB — COMPREHENSIVE METABOLIC PANEL
ALT: 33 U/L (ref 0–53)
AST: 29 U/L (ref 0–37)
Albumin: 4.1 g/dL (ref 3.5–5.2)
Alkaline Phosphatase: 138 U/L — ABNORMAL HIGH (ref 39–117)
Calcium: 9.6 mg/dL (ref 8.4–10.5)
Chloride: 109 mEq/L (ref 96–112)
Creatinine, Ser: 1.35 mg/dL (ref 0.50–1.35)
Potassium: 4.6 mEq/L (ref 3.5–5.3)

## 2012-01-16 NOTE — Telephone Encounter (Signed)
appts made and printed for pt aom °

## 2012-01-16 NOTE — Progress Notes (Signed)
Hematology and Oncology Follow Up Visit  [name] House [id number] Feb 19, 1946 66 y.o. 01/16/2012 3:52 PM  Principle Diagnosis: This is a 66 year old gentleman with the following diagnoses:  1. Iron deficiency anemia.  He has iron losses through the GI tract due to a history of gastric bypass operation.  He has been receiving intermittent iron replacement, most recently of which was in April 2011.  2.     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  His bone marrow biopsy had been unrevealing done around 2006.  His splenomegaly is likely related to his liver disease as well.  Interim History: [name] House presents today for a followup visit.  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  He has continued to be in excellent health and shape.  He has continued to perform his work-related duties without any major problems.  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  He does report fatigue and tiredness at the end of the day, but had not reported any abdominal pain, had not reported any diarrhea, had not reported any epistaxis or hematochezia.  He does report some occasional abdominal distention and gas, but this is again not associated with any nausea, not associated with any vomiting.   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     Medications: I have reviewed the patient's current medications. Current outpatient prescriptions:Cholecalciferol (VITAMIN D3) 2000 UNITS TABS, Take 1 capsule by mouth daily.  , Disp: , Rfl: ;  Ferrous Sulfate Dried (GNP IRON) 200 (65 FE) MG TABS, Take 1 tablet by mouth daily.  , Disp: , Rfl: ;  lisinopril (PRINIVIL,ZESTRIL) 10 MG tablet, Take 15 mg by mouth daily. , Disp: , Rfl: ;  methocarbamol (ROBAXIN) 500 MG tablet, Take 500 mg by mouth daily. , Disp: , Rfl:    Multiple Vitamins-Minerals (MULTIVITAMINS THER. W/MINERALS) TABS, Take 1 tablet by mouth daily.  , Disp: , Rfl: ;  Tamsulosin HCl (FLOMAX) 0.4 MG CAPS, 1 q HS to aid stone passage, Disp: 7 capsule, Rfl: 0;  vitamin B-12 (CYANOCOBALAMIN) 100 MCG tablet, Take 50 mcg by mouth daily.  , Disp: , Rfl:   Allergies: No Known Allergies  Past Medical History, Surgical history, Social history, and Family History were reviewed and updated.  Review of Systems: Constitutional:  Negative for fever, chills, night sweats, anorexia, weight loss, pain. Cardiovascular: no chest pain or dyspnea on exertion Respiratory: negative Neurological: negative Dermatological: negative ENT: negative Skin: Negative. Gastrointestinal: negative Genito-Urinary: negative Hematological and Lymphatic: negative Breast: negative Musculoskeletal: negative Remaining ROS negative. Physical Exam: Blood pressure 140/79, pulse 80, temperature 98.7 F (37.1 C), temperature source Oral, height 6\' 1"  (1.854 m), weight 249 lb 14.4 oz (113.354 kg). ECOG: 0 General appearance: alert Head: Normocephalic, without obvious abnormality, atraumatic Neck: no adenopathy, no carotid bruit, no JVD, supple, symmetrical, trachea midline and thyroid not enlarged, symmetric, no tenderness/mass/nodules Lymph nodes: Cervical, supraclavicular, and axillary nodes normal. Heart:regular rate and rhythm, S1, S2 normal, no murmur, click, rub or gallop Lung:chest clear, no wheezing, rales, normal symmetric air entry Abdomin: soft, non-tender, without masses or organomegaly EXT:no erythema, induration, or nodules   Lab Results: Lab Results  Component Value Date   WBC 4.0 01/16/2012   HGB 13.5 01/16/2012   HCT 38.7 01/16/2012   MCV 89.4 01/16/2012   PLT 48* 01/16/2012  Chemistry      Component Value Date/Time   NA 140 07/25/2011 1220   K 4.3 07/25/2011 1220   CL 110 07/25/2011 1220   CO2 23 07/25/2011 1220   BUN 17 07/25/2011 1220    CREATININE 1.0 07/25/2011 1220      Component Value Date/Time   CALCIUM 9.2 07/25/2011 1220   ALKPHOS 106 07/25/2011 1220   AST 32 07/25/2011 1220   ALT 29 07/25/2011 1220   BILITOT 1.1 07/25/2011 1220       Impression and Plan:  A 66 year old gentleman with the following issues:  1. Thrombocytopenia, differential diagnosis including hypersplenism due to portal hypertension and liver disease.  Could he have an element of autoimmune thrombocytopenia is also a possibility.  Could have a primary splenic neoplasm.  At this time, he does not really require any intervention.  He has no evidence of any bleeding.  He has adequate platelets.  Continue to monitor him at the time being.  You can certainly consider IV steroids or IVIG to boost his platelet counts if needed to.  2.     Iron deficiency anemia.  His hemoglobin is adequate at this time.  I am rechecking his iron stores and certainly if they are drifting down, I will set him up for Feraheme IV iron infusion.    [name] Hose, MD 4/11/20133:52 PM

## 2012-01-17 ENCOUNTER — Telehealth: Payer: Self-pay | Admitting: Oncology

## 2012-01-17 NOTE — Telephone Encounter (Signed)
appt for oct taken care of yesterday per FS lb order for 4/17 not needed.

## 2012-01-20 LAB — SPEP & IFE WITH QIG
Alpha-2-Globulin: 10.2 % (ref 7.1–11.8)
Gamma Globulin: 9.3 % — ABNORMAL LOW (ref 11.1–18.8)
IgG (Immunoglobin G), Serum: 678 mg/dL (ref 650–1600)
IgM, Serum: 19 mg/dL — ABNORMAL LOW (ref 41–251)
Total Protein, Serum Electrophoresis: 6.4 g/dL (ref 6.0–8.3)

## 2012-07-15 ENCOUNTER — Ambulatory Visit (HOSPITAL_BASED_OUTPATIENT_CLINIC_OR_DEPARTMENT_OTHER): Payer: BC Managed Care – PPO | Admitting: Oncology

## 2012-07-15 ENCOUNTER — Telehealth: Payer: Self-pay | Admitting: Oncology

## 2012-07-15 ENCOUNTER — Other Ambulatory Visit (HOSPITAL_BASED_OUTPATIENT_CLINIC_OR_DEPARTMENT_OTHER): Payer: Medicare Other | Admitting: Lab

## 2012-07-15 VITALS — BP 140/72 | HR 64 | Temp 97.1°F | Resp 20 | Ht 73.0 in | Wt 249.4 lb

## 2012-07-15 DIAGNOSIS — D500 Iron deficiency anemia secondary to blood loss (chronic): Secondary | ICD-10-CM

## 2012-07-15 DIAGNOSIS — D508 Other iron deficiency anemias: Secondary | ICD-10-CM

## 2012-07-15 DIAGNOSIS — D696 Thrombocytopenia, unspecified: Secondary | ICD-10-CM

## 2012-07-15 DIAGNOSIS — D731 Hypersplenism: Secondary | ICD-10-CM

## 2012-07-15 DIAGNOSIS — D649 Anemia, unspecified: Secondary | ICD-10-CM

## 2012-07-15 DIAGNOSIS — D72819 Decreased white blood cell count, unspecified: Secondary | ICD-10-CM

## 2012-07-15 LAB — FERRITIN: Ferritin: 86 ng/mL (ref 22–322)

## 2012-07-15 LAB — COMPREHENSIVE METABOLIC PANEL (CC13)
ALT: 34 U/L (ref 0–55)
AST: 31 U/L (ref 5–34)
BUN: 19 mg/dL (ref 7.0–26.0)
Calcium: 9.3 mg/dL (ref 8.4–10.4)
Chloride: 109 mEq/L — ABNORMAL HIGH (ref 98–107)
Creatinine: 1.2 mg/dL (ref 0.7–1.3)
Total Bilirubin: 1.5 mg/dL — ABNORMAL HIGH (ref 0.20–1.20)

## 2012-07-15 LAB — CBC WITH DIFFERENTIAL/PLATELET
BASO%: 0.4 % (ref 0.0–2.0)
Eosinophils Absolute: 0.1 10*3/uL (ref 0.0–0.5)
MCHC: 34.5 g/dL (ref 32.0–36.0)
MONO#: 0.2 10*3/uL (ref 0.1–0.9)
NEUT#: 2 10*3/uL (ref 1.5–6.5)
Platelets: 43 10*3/uL — ABNORMAL LOW (ref 140–400)
RBC: 4.58 10*6/uL (ref 4.20–5.82)
RDW: 13.5 % (ref 11.0–14.6)
WBC: 2.8 10*3/uL — ABNORMAL LOW (ref 4.0–10.3)
lymph#: 0.5 10*3/uL — ABNORMAL LOW (ref 0.9–3.3)

## 2012-07-15 LAB — IRON AND TIBC
%SAT: 26 % (ref 20–55)
Iron: 91 ug/dL (ref 42–165)
TIBC: 352 ug/dL (ref 215–435)
UIBC: 261 ug/dL (ref 125–400)

## 2012-07-15 NOTE — Progress Notes (Signed)
Hematology and Oncology Follow Up Visit  [name] House [id number] July 15, 1946 66 y.o. 07/15/2012 9:05 AM  Principle Diagnosis: This is a 66 year old gentleman with the following diagnoses:  1. Iron deficiency anemia.  He has iron losses through the GI tract due to a history of gastric bypass operation.  He has been receiving intermittent iron replacement, most recently of which was in April 2011.  2.     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  His bone marrow biopsy had been unrevealing done around 2006. His splenomegaly is likely related to his liver disease as well.  Interim History: [name] House presents today for a followup visit.  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  He has continued to be in excellent health and shape.  He has continued to perform his work-related duties without any major problems.  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  He does report fatigue and tiredness at the end of the day, but had not reported any abdominal pain, had not reported any diarrhea, had not reported any epistaxis or hematochezia.  He does report some occasional abdominal distention and gas, but this is again not associated with any nausea, not associated with any vomiting.   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   Medications: I have reviewed the patient's current medications. Current outpatient prescriptions:Cholecalciferol (VITAMIN D3) 2000 UNITS TABS, Take 1 capsule by mouth daily.  , Disp: , Rfl: ;  Ferrous Sulfate Dried (GNP IRON) 200 (65 FE) MG TABS, Take 1 tablet by mouth daily.  , Disp: , Rfl: ;  lisinopril (PRINIVIL,ZESTRIL) 20 MG tablet, Take 20 mg by mouth daily., Disp: , Rfl: ;  methocarbamol (ROBAXIN) 500 MG tablet, Take 500 mg by mouth daily. , Disp: , Rfl:  Multiple  Vitamins-Minerals (MULTIVITAMINS THER. W/MINERALS) TABS, Take 1 tablet by mouth daily.  , Disp: , Rfl: ;  vitamin B-12 (CYANOCOBALAMIN) 100 MCG tablet, Take 50 mcg by mouth daily.  , Disp: , Rfl:   Allergies: No Known Allergies  Past Medical History, Surgical history, Social history, and Family History were reviewed and updated.  Review of Systems: Constitutional:  Negative for fever, chills, night sweats, anorexia, weight loss, pain. Cardiovascular: no chest pain or dyspnea on exertion Respiratory: negative Neurological: negative Dermatological: negative ENT: negative Skin: Negative. Gastrointestinal: negative Genito-Urinary: negative Hematological and Lymphatic: negative Breast: negative Musculoskeletal: negative Remaining ROS negative. Physical Exam: Blood pressure 140/72, pulse 64, temperature 97.1 F (36.2 C), temperature source Oral, resp. rate 20, height 6\' 1"  (1.854 m), weight 249 lb 6.4 oz (113.127 kg). ECOG: 0 General appearance: alert Head: Normocephalic, without obvious abnormality, atraumatic Neck: no adenopathy, no carotid bruit, no JVD, supple, symmetrical, trachea midline and thyroid not enlarged, symmetric, no tenderness/mass/nodules Lymph nodes: Cervical, supraclavicular, and axillary nodes normal. Heart:regular rate and rhythm, S1, S2 normal, no murmur, click, rub or gallop Lung:chest clear, no wheezing, rales, normal symmetric air entry Abdomin: soft, non-tender, without masses or organomegaly EXT:no erythema, induration, or nodules   Lab Results: Lab Results  Component Value Date   WBC 2.8* 07/15/2012   HGB 13.7 07/15/2012   HCT 39.7 07/15/2012   MCV 86.7 07/15/2012   PLT 43* 07/15/2012     Chemistry      Component Value Date/Time   NA 138 01/16/2012 1459   K 4.6  01/16/2012 1459   CL 109 01/16/2012 1459   CO2 21 01/16/2012 1459   BUN 24* 01/16/2012 1459   CREATININE 1.35 01/16/2012 1459      Component Value Date/Time   CALCIUM 9.6 01/16/2012 1459    ALKPHOS 138* 01/16/2012 1459   AST 29 01/16/2012 1459   ALT 33 01/16/2012 1459   BILITOT 0.7 01/16/2012 1459       Impression and Plan:  A 66 year old gentleman with the following issues:  1. Thrombocytopenia, differential diagnosis including hypersplenism due to portal hypertension and liver disease.  Could he have an element of autoimmune thrombocytopenia is also a possibility.  Could have a primary splenic neoplasm.  At this time, he does not really require any intervention.  He has no evidence of any bleeding.  He has adequate platelets.  Continue to monitor him at the time being.  You can certainly consider IV steroids or IVIG to boost his platelet counts if needed to.  2.     Iron deficiency anemia.  His hemoglobin is adequate at this time.  I am rechecking his iron stores and certainly if they are drifting down, I will set him up for Feraheme IV iron infusion.    Traye Bates, MD 10/9/20139:05 AM

## 2012-07-15 NOTE — Telephone Encounter (Signed)
gv pt appt schedule for October 2013 and April 2014.

## 2012-07-23 ENCOUNTER — Ambulatory Visit (HOSPITAL_BASED_OUTPATIENT_CLINIC_OR_DEPARTMENT_OTHER): Payer: Medicare Other

## 2012-07-23 VITALS — BP 117/63 | HR 59 | Temp 97.6°F | Resp 17

## 2012-07-23 DIAGNOSIS — D500 Iron deficiency anemia secondary to blood loss (chronic): Secondary | ICD-10-CM

## 2012-07-23 DIAGNOSIS — E119 Type 2 diabetes mellitus without complications: Secondary | ICD-10-CM

## 2012-07-23 DIAGNOSIS — Z9884 Bariatric surgery status: Secondary | ICD-10-CM

## 2012-07-23 DIAGNOSIS — D508 Other iron deficiency anemias: Secondary | ICD-10-CM

## 2012-07-23 DIAGNOSIS — I10 Essential (primary) hypertension: Secondary | ICD-10-CM

## 2012-07-23 DIAGNOSIS — M159 Polyosteoarthritis, unspecified: Secondary | ICD-10-CM

## 2012-07-23 DIAGNOSIS — K648 Other hemorrhoids: Secondary | ICD-10-CM

## 2012-07-23 DIAGNOSIS — G4730 Sleep apnea, unspecified: Secondary | ICD-10-CM

## 2012-07-23 DIAGNOSIS — K5730 Diverticulosis of large intestine without perforation or abscess without bleeding: Secondary | ICD-10-CM

## 2012-07-23 DIAGNOSIS — D126 Benign neoplasm of colon, unspecified: Secondary | ICD-10-CM

## 2012-07-23 DIAGNOSIS — E785 Hyperlipidemia, unspecified: Secondary | ICD-10-CM

## 2012-07-23 DIAGNOSIS — Z87442 Personal history of urinary calculi: Secondary | ICD-10-CM

## 2012-07-23 DIAGNOSIS — I2510 Atherosclerotic heart disease of native coronary artery without angina pectoris: Secondary | ICD-10-CM

## 2012-07-23 MED ORDER — SODIUM CHLORIDE 0.9 % IV SOLN
Freq: Once | INTRAVENOUS | Status: AC
  Administered 2012-07-23: 13:00:00 via INTRAVENOUS

## 2012-07-23 MED ORDER — HEPARIN SOD (PORK) LOCK FLUSH 100 UNIT/ML IV SOLN
500.0000 [IU] | Freq: Once | INTRAVENOUS | Status: DC | PRN
  Filled 2012-07-23: qty 5

## 2012-07-23 MED ORDER — SODIUM CHLORIDE 0.9 % IV SOLN
1020.0000 mg | Freq: Once | INTRAVENOUS | Status: AC
  Administered 2012-07-23: 1020 mg via INTRAVENOUS
  Filled 2012-07-23: qty 34

## 2012-07-23 NOTE — Progress Notes (Signed)
1420 VSS. Patient tolerated treatment well with no complaints. Discharged ambulating after 30 minute observation post Feraheme infusion. AVS provided.

## 2012-07-23 NOTE — Patient Instructions (Signed)
  FERUMOXYTOL is an iron complex. Iron is used to make healthy red blood cells, which carry oxygen and nutrients throughout the body. This medicine is used to treat iron deficiency anemia in people with chronic kidney disease. This medicine may be used for other purposes; ask your health care provider or pharmacist if you have questions. What should I tell my health care provider before I take this medicine? They need to know if you have any of these conditions: -anemia not caused by low iron levels -high levels of iron in the blood -magnetic resonance imaging (MRI) test scheduled -an unusual or allergic reaction to iron, other medicines, foods, dyes, or preservatives -pregnant or trying to get pregnant -breast-feeding How should I use this medicine? This medicine is for infusion into a vein. It is given by a health care professional in a hospital or clinic setting. Talk to your pediatrician regarding the use of this medicine in children. Special care may be needed. Overdosage: If you think you've taken too much of this medicine contact a poison control center or emergency room at once. Overdosage: If you think you have taken too much of this medicine contact a poison control center or emergency room at once. NOTE: This medicine is only for you. Do not share this medicine with others. What if I miss a dose? It is important not to miss your dose. Call your doctor or health care professional if you are unable to keep an appointment. What may interact with this medicine? This medicine may interact with the following medications: -other iron products This list may not describe all possible interactions. Give your health care provider a list of all the medicines, herbs, non-prescription drugs, or dietary supplements you use. Also tell them if you smoke, drink alcohol, or use illegal drugs. Some items may interact with your medicine. What should I watch for while using this medicine? Visit your  doctor or healthcare professional regularly. Tell your doctor or healthcare professional if your symptoms do not start to get better or if they get worse. You may need blood work done while you are taking this medicine. You may need to follow a special diet. Talk to your doctor. Foods that contain iron include: whole grains/cereals, dried fruits, beans, or peas, leafy green vegetables, and organ meats (liver, kidney). What side effects may I notice from receiving this medicine? Side effects that you should report to your doctor or health care professional as soon as possible: -allergic reactions like skin rash, itching or hives, swelling of the face, lips, or tongue -breathing problems -changes in blood pressure -feeling faint or lightheaded, falls -fever or chills -flushing, sweating, or hot feelings -swelling of the ankles or feet Side effects that usually do not require medical attention (Report these to your doctor or health care professional if they continue or are bothersome.): -diarrhea -headache -nausea, vomiting -stomach pain This list may not describe all possible side effects. Call your doctor for medical advice about side effects. You may report side effects to FDA at 1-800-FDA-1088. Where should I keep my medicine? This drug is given in a hospital or clinic and will not be stored at home. NOTE: This sheet is a summary. It may not cover all possible information. If you have questions about this medicine, talk to your doctor, pharmacist, or health care provider.  2012, [employer] Standard. (06/15/2008 9:48:25 PM) 

## 2012-07-31 ENCOUNTER — Encounter: Payer: Self-pay | Admitting: Internal Medicine

## 2012-08-05 ENCOUNTER — Encounter: Payer: Self-pay | Admitting: Internal Medicine

## 2012-08-18 ENCOUNTER — Ambulatory Visit (AMBULATORY_SURGERY_CENTER): Payer: Medicare Other | Admitting: *Deleted

## 2012-08-18 VITALS — Ht 73.0 in | Wt 245.3 lb

## 2012-08-18 DIAGNOSIS — K7460 Unspecified cirrhosis of liver: Secondary | ICD-10-CM

## 2012-08-18 DIAGNOSIS — K766 Portal hypertension: Secondary | ICD-10-CM

## 2012-08-18 DIAGNOSIS — D509 Iron deficiency anemia, unspecified: Secondary | ICD-10-CM

## 2012-09-07 ENCOUNTER — Ambulatory Visit (AMBULATORY_SURGERY_CENTER): Payer: Medicare Other | Admitting: Internal Medicine

## 2012-09-07 ENCOUNTER — Encounter: Payer: Self-pay | Admitting: Internal Medicine

## 2012-09-07 VITALS — BP 119/66 | HR 61 | Temp 98.0°F | Resp 26 | Ht 73.0 in | Wt 245.0 lb

## 2012-09-07 DIAGNOSIS — I8500 Esophageal varices without bleeding: Secondary | ICD-10-CM

## 2012-09-07 DIAGNOSIS — K766 Portal hypertension: Secondary | ICD-10-CM

## 2012-09-07 DIAGNOSIS — K7460 Unspecified cirrhosis of liver: Secondary | ICD-10-CM

## 2012-09-07 DIAGNOSIS — D509 Iron deficiency anemia, unspecified: Secondary | ICD-10-CM

## 2012-09-07 MED ORDER — SODIUM CHLORIDE 0.9 % IV SOLN: 500.0000 mL | INTRAVENOUS | Status: DC

## 2012-09-07 MED ORDER — NADOLOL 20 MG PO TABS: 20.0000 mg | ORAL_TABLET | Freq: Every day | ORAL | Status: DC

## 2012-09-07 NOTE — Progress Notes (Signed)
Patient did not experience any of the following events: a burn prior to discharge; a fall within the facility; wrong site/side/patient/procedure/implant event; or a hospital transfer or hospital admission upon discharge from the facility. (G8907) Patient did not have preoperative order for IV antibiotic SSI prophylaxis. (G8918)  

## 2012-09-07 NOTE — Op Note (Signed)
[facility] [address] [place], [address]   ENDOSCOPY PROCEDURE REPORT  PATIENT: [name], House  MR#: [id number] BIRTHDATE: Jan 25, 1946 , 66  yrs. old GENDER: Male ENDOSCOPIST: Roxy Cedar, MD REFERRED BY:  Screening / Recall PROCEDURE DATE:  09/07/2012 PROCEDURE:  EGD, screening ASA CLASS:     Class III INDICATIONS:  Screening for varices.  Last exam 09-2011 trival varices MEDICATIONS: MAC sedation, administered by CRNA and propofol (Diprivan) 200mg  IV TOPICAL ANESTHETIC: none  DESCRIPTION OF PROCEDURE: After the risks benefits and alternatives of the procedure were thoroughly explained, informed consent was obtained.  The LB GIF-H180 [id number] endoscope was introduced through the mouth and advanced to the proximal jejunum. Without limitations.  The instrument was slowly withdrawn as the mucosa was fully examined.      The proximal and midesophagus appeared normal.  the distal esophagus revealed several columns of varices measuring 1+ and 2+.  No high-risk stigmata.  The stomach revealed evidence of altered surgical anatomy.  The gastric pouch was unremarkable as was the blind limb and jejunal limb.  Retroflexed view in the gastric pouch revealed no gastric varices.         The scope was then withdrawn from the patient and the procedure completed.  COMPLICATIONS: There were no complications. ENDOSCOPIC IMPRESSION: 1. 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 S/p Roux-en-y gastric bypass  RECOMMENDATIONS: 1.  Prescribe Nadolol 20 mg daily; #30;  6 refills . Primary prophylaxis for variceal bleeding 2.  OP follow-up in a few weeks.  REPEAT EXAM:  eSigned:  Roxy Cedar, MD 09/07/2012 4:06 PM   XB:JYNWGN Eloise Harman, MD and The Patient

## 2012-09-07 NOTE — Patient Instructions (Addendum)
YOU HAD AN ENDOSCOPIC PROCEDURE TODAY AT THE [facility]: Refer to the procedure report that was given to you for any specific questions about what was found during the examination.  If the procedure report does not answer your questions, please call your gastroenterologist to clarify.  If you requested that your care partner not be given the details of your procedure findings, then the procedure report has been included in a sealed envelope for you to review at your convenience later.  YOU SHOULD EXPECT: Some feelings of bloating in the abdomen. Passage of more gas than usual.  Walking can help get rid of the air that was put into your GI tract during the procedure and reduce the bloating. DIET: Your first meal following the procedure should be a light meal and then it is ok to progress to your normal diet.  A half-sandwich or bowl of soup is an example of a good first meal.  Heavy or fried foods are harder to digest and may make you feel nauseous or bloated.  Likewise meals heavy in dairy and vegetables can cause extra gas to form and this can also increase the bloating.  Drink plenty of fluids but you should avoid alcoholic beverages for 24 hours.  ACTIVITY: Your care partner should take you home directly after the procedure.  You should plan to take it easy, moving slowly for the rest of the day.  You can resume normal activity the day after the procedure however you should NOT DRIVE or use heavy machinery for 24 hours (because of the sedation medicines used during the test).    SYMPTOMS TO REPORT IMMEDIATELY: A gastroenterologist can be reached at any hour.  During normal business hours, 8:30 AM to 5:00 PM Monday through Friday, call [phone].  After hours and on weekends, please call the GI answering service at [phone] who will take a message and have the physician on call contact you.   Following upper endoscopy (EGD)  Vomiting of blood or coffee ground material  New  chest pain or pain under the shoulder blades  Painful or persistently difficult swallowing  New shortness of breath  Fever of 100F or higher  Black, tarry-looking stools  FOLLOW UP:  Our staff will call the home number listed on your records the next business day following your procedure to check on you and address any questions or concerns that you may have at that time regarding the information given to you following your procedure. This is a courtesy call and so if there is no answer at the home number and we have not heard from you through the emergency physician on call, we will assume that you have returned to your regular daily activities without incident.  SIGNATURES/CONFIDENTIALITY: You and/or your care partner have signed paperwork which will be entered into your electronic medical record.  These signatures attest to the fact that that the information above on your After Visit Summary has been reviewed and is understood.  Full responsibility of the confidentiality of this discharge information lies with you and/or your care-partner.   Your prescription was sent to [facility] on [address]  Please call office tomorrow for follow up appointment for a few weeks

## 2012-09-08 ENCOUNTER — Telehealth: Payer: Self-pay | Admitting: *Deleted

## 2012-09-08 NOTE — Telephone Encounter (Signed)
  Follow up Call-  Call back number 09/07/2012 08/23/2011 07/01/2011  Post procedure Call Back phone  # [phone] to leave message [phone]  Permission to leave phone message Yes - -     Patient questions:  Do you have a fever, pain , or abdominal swelling? no Pain Score  0 *  Have you tolerated food without any problems? yes  Have you been able to return to your normal activities? yes  Do you have any questions about your discharge instructions: Diet   no Medications  no Follow up visit  no  Do you have questions or concerns about your Care? no  Actions: * If pain score is 4 or above: No action needed, pain <4.

## 2012-09-21 ENCOUNTER — Encounter: Payer: Self-pay | Admitting: Internal Medicine

## 2012-09-21 ENCOUNTER — Ambulatory Visit (INDEPENDENT_AMBULATORY_CARE_PROVIDER_SITE_OTHER): Payer: Medicare Other | Admitting: Internal Medicine

## 2012-09-21 VITALS — BP 128/72 | HR 68 | Ht 71.5 in | Wt 246.2 lb

## 2012-09-21 DIAGNOSIS — I8500 Esophageal varices without bleeding: Secondary | ICD-10-CM

## 2012-09-21 DIAGNOSIS — K766 Portal hypertension: Secondary | ICD-10-CM

## 2012-09-21 DIAGNOSIS — K7460 Unspecified cirrhosis of liver: Secondary | ICD-10-CM

## 2012-09-21 DIAGNOSIS — Z86010 Personal history of colonic polyps: Secondary | ICD-10-CM

## 2012-09-21 NOTE — Progress Notes (Signed)
HISTORY OF PRESENT ILLNESS:  [name] House is a 66 y.o. 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 Negative evaluation for other etiologies. He recently underwent relook screening endoscopy and was found to have 1-2+ varices that were slightly progressed from his examination when you're previous. Because of his altered anatomy and the potential problems posed by variceal bleeding, nadolol 20 mg daily was prescribed. He follows up at this time. For about 3 days he felt a bit dizzy on the medication. Since then no complaints and doing well. His resting heart rate has been about 60.  REVIEW OF SYSTEMS:  All non-GI ROS negative   Past Medical History  Diagnosis Date  . Diabetes mellitus, type 2     resolved with gastric bypass  . Hyperlipemia   . Kidney stones   . Osteoarthritis of hip   . Hypertension   . Benign neoplasm of colon   . Diverticulosis of colon (without mention of hemorrhage)   . Iron deficiency anemia secondary to blood loss (chronic)   . Unspecified sleep apnea     Resolved  . Cirrhosis   . Liver cyst   . Renal cyst   . Esophageal varices     Past Surgical History  Procedure Date  . Knee surgery   . Ankle surgery   . Gastric bypass 2005  . Total hip arthroplasty 2008  . Colonoscopy 2012  . Polypectomy     Social History [name]  reports that he has never smoked. He has never used smokeless tobacco. He reports that he drinks alcohol. He reports that he does not use illicit drugs.  family history includes Coronary artery disease in his brother; Diabetes in his mother; and Heart attack in his father.  There is no history  of Colon cancer and Stomach cancer.  No Known Allergies     PHYSICAL EXAMINATION: Vital signs: BP 128/72  Pulse 68  Ht 5' 11.5" (1.816 m)  Wt 246 lb 4 oz (111.698 kg)  BMI 33.87 kg/m2. Resting pulse 60 General: Well-developed,obese, well-nourished, no acute distress HEENT: Sclerae are anicteric, conjunctiva pink. Oral mucosa intact Lungs: Clear Heart: Regular Abdomen: soft,obese, nontender, nondistended, no obvious ascites, no peritoneal signs, normal bowel sounds. No organomegaly. Extremities: No edema Psychiatric: alert and oriented x3. Cooperative    ASSESSMENT:  #1. 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 Recently started on primary prophylaxis with nadolol 20 mg daily. Resting heart rate 60 #3. History of multiple adenomatous colon polyps. Due for surveillance in 2 years #4. Multiple medical problems. Under the care of Dr. Eloise Harman  PLAN:  #1. Discussion today on portal hypertension and varices. Also discussed the rationale behind primary prophylaxis #2. Continue nadolol #3. Relook endoscopy in 1 year #4. Surveillance colonoscopy in 2 years

## 2012-09-21 NOTE — Patient Instructions (Addendum)
Dr. Marina Goodell will see in you in a year when you have your follow up endoscopy

## 2013-01-13 ENCOUNTER — Other Ambulatory Visit (HOSPITAL_BASED_OUTPATIENT_CLINIC_OR_DEPARTMENT_OTHER): Payer: Medicare Other | Admitting: Lab

## 2013-01-13 ENCOUNTER — Telehealth: Payer: Self-pay | Admitting: Oncology

## 2013-01-13 ENCOUNTER — Ambulatory Visit (HOSPITAL_BASED_OUTPATIENT_CLINIC_OR_DEPARTMENT_OTHER): Payer: Medicare Other | Admitting: Oncology

## 2013-01-13 VITALS — BP 141/89 | HR 58 | Temp 96.0°F | Resp 20 | Wt 245.6 lb

## 2013-01-13 DIAGNOSIS — D509 Iron deficiency anemia, unspecified: Secondary | ICD-10-CM

## 2013-01-13 DIAGNOSIS — D500 Iron deficiency anemia secondary to blood loss (chronic): Secondary | ICD-10-CM

## 2013-01-13 DIAGNOSIS — D696 Thrombocytopenia, unspecified: Secondary | ICD-10-CM

## 2013-01-13 DIAGNOSIS — Z9884 Bariatric surgery status: Secondary | ICD-10-CM

## 2013-01-13 LAB — COMPREHENSIVE METABOLIC PANEL (CC13)
AST: 33 U/L (ref 5–34)
Albumin: 3.7 g/dL (ref 3.5–5.0)
Alkaline Phosphatase: 144 U/L (ref 40–150)
BUN: 19.7 mg/dL (ref 7.0–26.0)
Creatinine: 1.1 mg/dL (ref 0.7–1.3)
Glucose: 152 mg/dl — ABNORMAL HIGH (ref 70–99)
Potassium: 4.2 mEq/L (ref 3.5–5.1)
Total Bilirubin: 1.47 mg/dL — ABNORMAL HIGH (ref 0.20–1.20)

## 2013-01-13 LAB — CBC WITH DIFFERENTIAL/PLATELET
Basophils Absolute: 0 10*3/uL (ref 0.0–0.1)
EOS%: 2.8 % (ref 0.0–7.0)
HGB: 13.7 g/dL (ref 13.0–17.1)
LYMPH%: 14.1 % (ref 14.0–49.0)
MCH: 30.6 pg (ref 27.2–33.4)
MCV: 85.7 fL (ref 79.3–98.0)
MONO%: 7.4 % (ref 0.0–14.0)
Platelets: 48 10*3/uL — ABNORMAL LOW (ref 140–400)
RDW: 13.3 % (ref 11.0–14.6)

## 2013-01-13 LAB — IRON AND TIBC
%SAT: 26 % (ref 20–55)
Iron: 81 ug/dL (ref 42–165)
TIBC: 314 ug/dL (ref 215–435)
UIBC: 233 ug/dL (ref 125–400)

## 2013-01-13 NOTE — Progress Notes (Signed)
Hematology and Oncology Follow Up Visit  [name] House [id number] [birth year] 67 y.o. 01/13/2013 9:11 AM  Principle Diagnosis: This is a 67 year old gentleman with the following diagnoses:  1. Iron deficiency anemia.  He has iron losses through the GI tract due to a history of gastric bypass operation.  He has been receiving intermittent iron replacement, most recently of which was in April 2011.  2.     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  His bone marrow biopsy had been unrevealing done around 2006. His splenomegaly is likely related to his liver disease as well.  Interim History: [name] House presents today for a followup visit.  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  He has continued to be in excellent health and shape.  He has continued to perform his work-related duties without any major problems.  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  He does report fatigue and tiredness at the end of the day, but had not reported any abdominal pain, had not reported any diarrhea, had not reported any epistaxis or hematochezia.  He does report some occasional abdominal distention and gas, but this is again not associated with any nausea, not associated with any vomiting.   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 No confusion noted at this time.   Medications: I have reviewed the patient's current medications. Current outpatient prescriptions:Cholecalciferol (VITAMIN D3) 2000 UNITS TABS, Take 1 capsule by mouth daily.  , Disp: , Rfl: ;  Ferrous Sulfate Dried (GNP IRON) 200 (65 FE) MG TABS, Take 1 tablet by mouth daily.  , Disp: , Rfl: ;  lisinopril (PRINIVIL,ZESTRIL) 20 MG tablet, Take 20 mg by mouth daily., Disp: , Rfl: ;  methocarbamol (ROBAXIN) 500 MG tablet, Take 500 mg by mouth  daily. , Disp: , Rfl:  Multiple Vitamins-Minerals (MULTIVITAMINS THER. W/MINERALS) TABS, Take 1 tablet by mouth daily.  , Disp: , Rfl: ;  nadolol (CORGARD) 20 MG tablet, Take 1 tablet (20 mg total) by mouth daily., Disp: 30 tablet, Rfl: 6;  vitamin B-12 (CYANOCOBALAMIN) 100 MCG tablet, Take 50 mcg by mouth daily.  , Disp: , Rfl:   Allergies: No Known Allergies  Past Medical History, Surgical history, Social history, and Family History were reviewed and updated.  Review of Systems: Constitutional:  Negative for fever, chills, night sweats, anorexia, weight loss, pain. Cardiovascular: no chest pain or dyspnea on exertion Respiratory: negative Neurological: negative Dermatological: negative ENT: negative Skin: Negative. Gastrointestinal: negative Genito-Urinary: negative Hematological and Lymphatic: negative Breast: negative Musculoskeletal: negative Remaining ROS negative. Physical Exam: Blood pressure 141/89, pulse 58, temperature 96 F (35.6 C), temperature source Oral, resp. rate 20, weight 245 lb 9 oz (111.386 kg). ECOG: 0 General appearance: alert Head: Normocephalic, without obvious abnormality, atraumatic Neck: no adenopathy, no carotid bruit, no JVD, supple, symmetrical, trachea midline and thyroid not enlarged, symmetric, no tenderness/mass/nodules Lymph nodes: Cervical, supraclavicular, and axillary nodes normal. Heart:regular rate and rhythm, S1, S2 normal, no murmur, click, rub or gallop Lung:chest clear, no wheezing, rales, normal symmetric air entry Abdomin: soft, non-tender, without masses or organomegaly EXT:no erythema, induration, or nodules   Lab Results: Lab Results  Component Value Date   WBC 3.9* 01/13/2013   HGB 13.7 01/13/2013   HCT 38.3* 01/13/2013   MCV 85.7 01/13/2013   PLT 48* 01/13/2013  Chemistry      Component Value Date/Time   NA 140 07/15/2012 0837   NA 138 01/16/2012 1459   K 4.2 07/15/2012 0837   K 4.6 01/16/2012 1459   CL 109* 07/15/2012 0837    CL 109 01/16/2012 1459   CO2 22 07/15/2012 0837   CO2 21 01/16/2012 1459   BUN 19.0 07/15/2012 0837   BUN 24* 01/16/2012 1459   CREATININE 1.2 07/15/2012 0837   CREATININE 1.35 01/16/2012 1459      Component Value Date/Time   CALCIUM 9.3 07/15/2012 0837   CALCIUM 9.6 01/16/2012 1459   ALKPHOS 121 07/15/2012 0837   ALKPHOS 138* 01/16/2012 1459   AST 31 07/15/2012 0837   AST 29 01/16/2012 1459   ALT 34 07/15/2012 0837   ALT 33 01/16/2012 1459   BILITOT 1.50* 07/15/2012 0837   BILITOT 0.7 01/16/2012 1459       Impression and Plan:  [name] with the following issues:  1. Thrombocytopenia, differential diagnosis including hypersplenism due to portal hypertension and liver disease.  Could he have an element of autoimmune thrombocytopenia is also a possibility.  Could have a primary splenic neoplasm.  At this time, he does not really require any intervention.  He has no evidence of any bleeding.  He has adequate platelets.  Continue to monitor him at the time being.  You can certainly consider IV steroids or IVIG to boost his platelet counts if needed to.  2.     Iron deficiency anemia.  His hemoglobin is adequate at this time.  I am rechecking his iron stores and certainly if they are drifting down, I will set him up for Feraheme IV iron infusion.    [facility], MD 4/9/20149:11 AM

## 2013-03-25 ENCOUNTER — Other Ambulatory Visit: Payer: Self-pay | Admitting: Dermatology

## 2013-04-09 ENCOUNTER — Other Ambulatory Visit: Payer: Self-pay | Admitting: Internal Medicine

## 2013-06-28 ENCOUNTER — Other Ambulatory Visit: Payer: Self-pay | Admitting: Internal Medicine

## 2013-06-28 DIAGNOSIS — K429 Umbilical hernia without obstruction or gangrene: Secondary | ICD-10-CM

## 2013-07-02 ENCOUNTER — Ambulatory Visit
Admission: RE | Admit: 2013-07-02 | Discharge: 2013-07-02 | Disposition: A | Discharge status: Home / Self Care | Payer: BC Managed Care – PPO | Source: Ambulatory Visit | Attending: Internal Medicine | Admitting: Internal Medicine

## 2013-07-02 DIAGNOSIS — K429 Umbilical hernia without obstruction or gangrene: Secondary | ICD-10-CM

## 2013-07-02 MED ORDER — IOHEXOL 300 MG/ML  SOLN
125.0000 mL | Freq: Once | INTRAMUSCULAR | Status: AC | PRN
  Administered 2013-07-02: 125 mL via INTRAVENOUS

## 2013-07-05 ENCOUNTER — Encounter (INDEPENDENT_AMBULATORY_CARE_PROVIDER_SITE_OTHER): Payer: Self-pay | Admitting: Surgery

## 2013-07-05 ENCOUNTER — Ambulatory Visit (INDEPENDENT_AMBULATORY_CARE_PROVIDER_SITE_OTHER): Payer: Medicare Other | Admitting: Surgery

## 2013-07-05 VITALS — BP 140/82 | HR 68 | Temp 98.5°F | Resp 14 | Ht 71.75 in | Wt 235.4 lb

## 2013-07-05 DIAGNOSIS — K429 Umbilical hernia without obstruction or gangrene: Secondary | ICD-10-CM

## 2013-07-05 NOTE — Patient Instructions (Signed)
Hernia A hernia occurs when an internal organ pushes out through a weak spot in the abdominal wall. Hernias most commonly occur in the groin and around the navel. Hernias often can be pushed back into place (reduced). Most hernias tend to get worse over time. Some abdominal hernias can get stuck in the opening (irreducible or incarcerated hernia) and cannot be reduced. An irreducible abdominal hernia which is tightly squeezed into the opening is at risk for impaired blood supply (strangulated hernia). A strangulated hernia is a medical emergency. Because of the risk for an irreducible or strangulated hernia, surgery may be recommended to repair a hernia. CAUSES  Heavy lifting. Prolonged coughing. Straining to have a bowel movement. A cut (incision) made during an abdominal surgery. HOME CARE INSTRUCTIONS  Bed rest is not required. You may continue your normal activities. Avoid lifting more than 10 pounds (4.5 kg) or straining. Cough gently. If you are a smoker it is best to stop. Even the best hernia repair can break down with the continual strain of coughing. Even if you do not have your hernia repaired, a cough will continue to aggravate the problem. Do not wear anything tight over your hernia. Do not try to keep it in with an outside bandage or truss. These can damage abdominal contents if they are trapped within the hernia sac. Eat a normal diet. Avoid constipation. Straining over long periods of time will increase hernia size and encourage breakdown of repairs. If you cannot do this with diet alone, stool softeners may be used. SEEK IMMEDIATE MEDICAL CARE IF:  You have a fever. You develop increasing abdominal pain. You feel nauseous or vomit. Your hernia is stuck outside the abdomen, looks discolored, feels hard, or is tender. You have any changes in your bowel habits or in the hernia that are unusual for you. You have increased pain or swelling around the hernia. You cannot push the  hernia back in place by applying gentle pressure while lying down. MAKE SURE YOU:  Understand these instructions. Will watch your condition. Will get help right away if you are not doing well or get worse. Document Released: 09/23/2005 Document Revised: 12/16/2011 Document Reviewed: 05/12/2008 [facility] Patient Information 2014 [place], [place]. Cirrhosis Cirrhosis is a condition of scarring of the liver which is caused when the liver has tried repairing itself following damage. This damage may come from a previous infection such as one of the forms of hepatitis (usually hepatitis C), or the damage may come from being injured by toxins. The main toxin that causes this damage is alcohol. The scarring of the liver from use of alcohol is irreversible. That means the liver cannot return to normal even though alcohol is not used any more. The main danger of hepatitis C infection is that it may cause long-lasting (chronic) liver disease, and this also may lead to cirrhosis. This complication is progressive and irreversible. CAUSES  Prior to available blood tests, hepatitis C could be contracted by blood transfusions. Since testing of blood has improved, this is now unlikely. This infection can also be contracted through intravenous drug use and the sharing of needles. It can also be contracted through sexual relationships. The injury caused by alcohol comes from too much use. It is not a few drinks that poison the liver, but years of misuse. Usually there will be some signs and symptoms early with scarring of the liver that suggest the development of better habits. Alcohol should never be used while using acetaminophen. A small dose  of both taken together may cause irreversible damage to the liver. HOME CARE INSTRUCTIONS  There is no specific treatment for cirrhosis. However, there are things you can do to avoid making the condition worse. Rest as needed. Eat a well-balanced diet. Your caregiver can help you  with suggestions. Vitamin supplements including vitamins A, K, D, and thiamine can help. A low-salt diet, water restriction, or diuretic medicine may be needed to reduce fluid retention. Avoid alcohol. This can be extremely toxic if combined with acetaminophen. Avoid drugs which are toxic to the liver. Some of these include isoniazid, methyldopa, acetaminophen, anabolic steroids (muscle-building drugs), erythromycin, and oral contraceptives (birth control pills). Check with your caregiver to make sure medicines you are presently taking will not be harmful. Periodic blood tests may be required. Follow your caregiver's advice regarding the timing of these. Milk thistle is an herbal remedy which does protect the liver against toxins. However, it will not help once the liver has been scarred. SEEK MEDICAL CARE IF: You have increasing fatigue or weakness. You develop swelling of the hands, feet, legs, or face. You vomit bright red blood, or a coffee ground appearing material. You have blood in your stools, or the stools turn black and tarry. You have a fever. You develop loss of appetite, or have nausea and vomiting. You develop jaundice. You develop easy bruising or bleeding. You have worsening of any of the problems you are concerned about. Document Released: 09/23/2005 Document Revised: 12/16/2011 Document Reviewed: 05/11/2008 [facility] Patient Information 2014 [place], [place]. Ascites Ascites is a gathering of fluid in the belly (abdomen). This is most often caused by liver disease. It may also be caused by a number of other less common problems. It causes a ballooning out (distension) of the abdomen. CAUSES  Scarring of the liver (cirrhosis) is the most common cause of ascites. Other causes include: Infection or inflammation in the abdomen. Cancer in the abdomen. Heart failure. Certain forms of kidney failure (nephritic syndrome). Inflammation of the pancreas. Clots in the veins of the  liver. SYMPTOMS  In the early stages of ascites, you may not have any symptoms. The main symptom of ascites is a sense of abdominal bloating. This is due to the presence of fluid. This may also cause an increase in abdominal or waist size. People with this condition can develop swelling in the legs, and men can develop a swollen scrotum. When there is a lot of fluid, it may be hard to breath. Stretching of the abdomen by fluid can be painful. DIAGNOSIS  Certain features of your medical history, such as a history of liver disease and of an enlarging abdomen, can suggest the presence of ascites. The diagnosis of ascites can be made on physical exam by your caregiver. An abdominal ultrasound examination can confirm that ascites is present, and estimate the amount of fluid. Once ascites is confirmed, it is important to determine its cause. Again, a history of one of the conditions listed in "CAUSES" provides a strong clue. A physical exam is important, and blood and X-ray tests may be needed. During a procedure called paracentesis, a sample of fluid is removed from the abdomen. This can determine certain key features about the fluid, such as whether or not infection or cancer is present. Your caregiver will determine if a paracentesis is necessary. They will describe the procedure to you. PREVENTION  Ascites is a complication of other conditions. Therefore to prevent ascites, you must seek treatment for any significant health conditions you  have. Once ascites is present, careful attention to fluid and salt intake may help prevent it from getting worse. If you have ascites, you should not drink alcohol. PROGNOSIS  The prognosis of ascites depends on the underlying disease. If the disease is reversible, such as with certain infections or with heart failure, then ascites may improve or disappear. When ascites is caused by cirrhosis, then it indicates that the liver disease has worsened, and further evaluation and  treatment of the liver disease is needed. If your ascites is caused by cancer, then the success or failure of the cancer treatment will determine whether your ascites will improve or worsen. RISKS AND COMPLICATIONS  Ascites is likely to worsen if it is not properly diagnosed and treated. A large amount of ascites can cause pain and difficulty breathing. The main complication, besides worsening, is infection (called spontaneous bacterial peritonitis). This requires prompt treatment. TREATMENT  The treatment of ascites depends on its cause. When liver disease is your cause, medical management using water pills (diuretics) and decreasing salt intake is often effective. Ascites due to peritoneal inflammation or malignancy (cancer) alone does not respond to salt restriction and diuretics. Hospitalization is sometimes required. If the treatment of ascites cannot be managed with medications, a number of other treatments are available. Your caregivers will help you decide which will work best for you. Some of these are: Removal of fluid from the abdomen (paracentesis). Fluid from the abdomen is passed into a vein (peritoneovenous shunting). Liver transplantation. Transjugular intrahepatic portosystemic stent shunt. HOME CARE INSTRUCTIONS  It is important to monitor body weight and the intake and output of fluids. Weigh yourself at the same time every day. Record your weights. Fluid restriction may be necessary. It is also important to know your salt intake. The more salt you take in, the more fluid you will retain. Ninety percent of people with ascites respond to this approach. Follow any directions for medicines carefully. Follow up with your caregiver, as directed. Report any changes in your health, especially any new or worsening symptoms. If your ascites is from liver disease, avoid alcohol and other substances toxic to the liver. SEEK MEDICAL CARE IF:  Your weight increases more than a few pounds in a  few days. Your abdominal or waist size increases. You develop swelling in your legs. You had swelling and it worsens. SEEK IMMEDIATE MEDICAL CARE IF:  You develop a fever. You develop new abdominal pain. You develop difficulty breathing. You develop confusion. You have bleeding from the mouth, stomach, or rectum. MAKE SURE YOU:  Understand these instructions. Will watch your condition. Will get help right away if you are not doing well or get worse. Document Released: 09/23/2005 Document Revised: 12/16/2011 Document Reviewed: 04/24/2007 [employer] Patient Information 2014 [place], [place]. 2 Gram Low Sodium Diet A 2 gram sodium diet restricts the amount of sodium in the diet to no more than 2 g or 2000 mg daily. Limiting the amount of sodium is often used to help lower blood pressure. It is important if you have heart, liver, or kidney problems. Many foods contain sodium for flavor and sometimes as a preservative. When the amount of sodium in a diet needs to be low, it is important to know what to look for when choosing foods and drinks. The following includes some information and guidelines to help make it easier for you to adapt to a low sodium diet. QUICK TIPS  Do not add salt to food.  Avoid convenience items and fast  food.  Choose unsalted snack foods.  Buy lower sodium products, often labeled as "lower sodium" or "no salt added."  Check food labels to learn how much sodium is in 1 serving.  When eating at a restaurant, ask that your food be prepared with less salt or none, if possible. READING FOOD LABELS FOR SODIUM INFORMATION The nutrition facts label is a good place to find how much sodium is in foods. Look for products with no more than 500 to 600 mg of sodium per meal and no more than 150 mg per serving. Remember that 2 g = 2000 mg. The food label may also list foods as:  Sodium-free: Less than 5 mg in a serving.  Very low sodium: 35 mg or less in a  serving.  Low-sodium: 140 mg or less in a serving.  Light in sodium: 50% less sodium in a serving. For example, if a food that usually has 300 mg of sodium is changed to become light in sodium, it will have 150 mg of sodium.  Reduced sodium: 25% less sodium in a serving. For example, if a food that usually has 400 mg of sodium is changed to reduced sodium, it will have 300 mg of sodium. CHOOSING FOODS Grains  Avoid: Salted crackers and snack items. Some cereals, including instant hot cereals. Bread stuffing and biscuit mixes. Seasoned rice or pasta mixes.  Choose: Unsalted snack items. Low-sodium cereals, oats, puffed wheat and rice, shredded wheat. English muffins and bread. Pasta. Meats  Avoid: Salted, canned, smoked, spiced, pickled meats, including fish and poultry. Bacon, ham, sausage, cold cuts, hot dogs, anchovies.  Choose: Low-sodium canned tuna and salmon. Fresh or frozen meat, poultry, and fish. Dairy  Avoid: Processed cheese and spreads. Cottage cheese. Buttermilk and condensed milk. Regular cheese.  Choose: Milk. Low-sodium cottage cheese. Yogurt. Sour cream. Low-sodium cheese. Fruits and Vegetables  Avoid: Regular canned vegetables. Regular canned tomato sauce and paste. Frozen vegetables in sauces. Olives. Rosita Fire. Relishes. Sauerkraut.  Choose: Low-sodium canned vegetables. Low-sodium tomato sauce and paste. Frozen or fresh vegetables. Fresh and frozen fruit. Condiments  Avoid: Canned and packaged gravies. Worcestershire sauce. Tartar sauce. Barbecue sauce. Soy sauce. Steak sauce. Ketchup. Onion, garlic, and table salt. Meat flavorings and tenderizers.  Choose: Fresh and dried herbs and spices. Low-sodium varieties of mustard and ketchup. Lemon juice. Tabasco sauce. Horseradish. SAMPLE 2 GRAM SODIUM MEAL PLAN Breakfast / Sodium (mg)  1 cup low-fat milk / 143 mg  2 slices whole-wheat toast / 270 mg  1 tbs heart-healthy margarine / 153 mg  1 hard-boiled egg /  139 mg  1 small orange / 0 mg Lunch / Sodium (mg)  1 cup raw carrots / 76 mg   cup hummus / 298 mg  1 cup low-fat milk / 143 mg   cup red grapes / 2 mg  1 whole-wheat pita bread / 356 mg Dinner / Sodium (mg)  1 cup whole-wheat pasta / 2 mg  1 cup low-sodium tomato sauce / 73 mg  3 oz lean ground beef / 57 mg  1 small side salad (1 cup raw spinach leaves,  cup cucumber,  cup yellow bell pepper) with 1 tsp olive oil and 1 tsp red wine vinegar / 25 mg Snack / Sodium (mg)  1 container low-fat vanilla yogurt / 107 mg  3 graham cracker squares / 127 mg Nutrient Analysis  Calories: 2033  Protein: 77 g  Carbohydrate: 282 g  Fat: 72 g  Sodium: 1971 mg Document  Released: 09/23/2005 Document Revised: 12/16/2011 Document Reviewed: 12/25/2009 [facility] Patient Information 2014 [place], [place].

## 2013-07-05 NOTE — Progress Notes (Signed)
Patient ID: [name] House, male   DOB: 1946/06/30, 67 y.o.   MRN: [id number]  Chief Complaint  Patient presents with  . New Evaluation    eval UMB hernia    HPI [name] House is a 67 y.o. male.  Patient sent at the request of Dr. Ivery Quale for umbilical hernia. This is been present for at least one year. History of gastric bypass in 2008 with a 150 pounds weight loss. 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 He is an umbilical hernia for at least a year. He was evaluated by Dr. Jarold Motto and sent for consultation for the hernia. Denies any pain at the site. No redness or leakage from the site. No significant abdominal pain. His abdomen has gotten larger he thinks. CT scan showed hernia defect with a hernia sac of 4.8 cm the fascial defect of 2 cm with ascitic fluid in it. There was ascites noted on CT scan which appeared to be mild. He does have portal hypertension and esophageal varices are followed by Dr. Marina Goodell. No history of GI bleeding. He does drink alcohol occasionally. HPI  Past Medical History  Diagnosis Date  . Diabetes mellitus, type 2     resolved with gastric bypass  . Hyperlipemia   . Kidney stones   . Osteoarthritis of hip   . Hypertension   . Benign neoplasm of colon   . Diverticulosis of colon (without mention of hemorrhage)   . Iron deficiency anemia secondary to blood loss (chronic)   . Unspecified sleep apnea     Resolved  . Cirrhosis   . Liver cyst   . Renal cyst   . Esophageal varices     Past Surgical History  Procedure Laterality Date  . Knee surgery    . Ankle surgery    . Gastric bypass  2005  . Total hip arthroplasty  2008  . Colonoscopy  2012  . Polypectomy      Family History  Problem Relation Age of Onset  . Heart attack Father     deceased due to MI  . Heart disease Father   . Diabetes Mother     deceased due to complications of DM  . Coronary artery disease Brother   . Colon cancer Neg Hx   . Stomach cancer Neg Hx      Social History History  Substance Use Topics  . Smoking status: Never Smoker   . Smokeless tobacco: Never Used  . Alcohol Use: 0.0 oz/week    2-3 Glasses of wine per week     Comment: week    Allergies  Allergen Reactions  . Oxycodone Other (See Comments)    Caused him to fall..really sedated    Current Outpatient Prescriptions  Medication Sig Dispense Refill  . Cholecalciferol (VITAMIN D3) 2000 UNITS TABS Take 1 capsule by mouth daily.        . diphenoxylate-atropine (LOMOTIL) 2.5-0.025 MG per tablet Take 1 tablet by mouth 4 (four) times daily as needed for diarrhea or loose stools.      . Ferrous Sulfate Dried (GNP IRON) 200 (65 FE) MG TABS Take 1 tablet by mouth daily.        Marland Kitchen lisinopril (PRINIVIL,ZESTRIL) 20 MG tablet Take 20 mg by mouth daily.      . Multiple Vitamins-Minerals (MULTIVITAMINS THER. W/MINERALS) TABS Take 1 tablet by mouth daily.        . nadolol (CORGARD) 20 MG tablet TAKE 1 TABLET (20 MG  TOTAL) BY MOUTH DAILY.  30 tablet  6  . methocarbamol (ROBAXIN) 500 MG tablet Take 500 mg by mouth daily.        No current facility-administered medications for this visit.    Review of Systems Review of Systems  Constitutional: Negative.   HENT: Negative.   Eyes: Negative.   Respiratory: Negative.   Cardiovascular: Negative.   Gastrointestinal: Positive for abdominal distention.  Endocrine: Negative.   Genitourinary: Negative.   Musculoskeletal: Negative.   Skin: Negative.   Allergic/Immunologic: Negative.   Neurological: Negative.   Hematological: Negative.   Psychiatric/Behavioral: Negative.     Blood pressure 140/82, pulse 68, temperature 98.5 F (36.9 C), temperature source Temporal, resp. rate 14, height 5' 11.75" (1.822 m), weight 235 lb 6.4 oz (106.777 kg).  Physical Exam Physical Exam  Constitutional: He appears well-developed and well-nourished. No distress.  HENT:  Head: Normocephalic and atraumatic.  Eyes: EOM are normal. Pupils are  equal, round, and reactive to light. No scleral icterus.  Neck: Normal range of motion. Neck supple.  Cardiovascular: Normal rate and regular rhythm.   Pulmonary/Chest: Effort normal and breath sounds normal.  Abdominal: He exhibits fluid wave and ascites. There is no tenderness. A hernia is present.    Musculoskeletal: Normal range of motion.  Neurological: He is alert.  Skin: Skin is warm and dry.  Psychiatric: He has a normal mood and affect. His behavior is normal. Judgment and thought content normal.    Data Reviewed  Multidetector CT imaging of the abdomen and pelvis was performed  using the standard protocol following bolus administration of  intravenous contrast.  CONTRAST: OMNIPAQUE IOHEXOL 300 MG/ML SOLN  COMPARISON: 08/19/2011  FINDINGS:  The lung bases are clear. No pleural or pericardial effusion  identified.  The liver is atrophic and has a nodular contour compatible with  cirrhosis. Exophytic cyst arising from the inferior pole of the  right hepatic lobe is stable measuring 3.9 cm. The umbilical vein is  recannulized. Prior cholecystectomy. No biliary dilatation. The  pancreas is unremarkable. The spleen is enlarged measuring 24 cm in  cranial caudal dimension. There is extensive esophageal varices  identified.  The adrenal glands are both normal. Multiple right renal cysts are  again identified. There is a stone within the inferior pole of the  right kidney which measures 6 mm, image number 54/ series 2. The  left kidney appears normal. Urinary bladder is normal.  Normal caliber of the abdominal aorta. There is no upper abdominal  adenopathy. No pelvic or inguinal adenopathy identified.  Since the previous CT there has been interval development of  moderate ascites within the abdomen and pelvis. No focal fluid  collections identified.  Postoperative changes involving the proximal gastrointestinal tract  are identified compatible with the clinical history  of gastric  bypass surgery. There is no evidence for bowel obstruction. The  small and large bowel loops have a normal course and caliber.  There is an umbilical hernia which measures 4.9 x 3.7 x 5.7 cm. This  is filled with ascites. This appears to be complicated by internal  areas of septation.  Reviwe of the visualized bony structures is significant for  multilevel degenerative disc disease within the lumbar spine. Prior  right hemi arthroplasty has been performed.  IMPRESSION:  1. No acute findings stress set morphologic features of the liver  compatible with cirrhosis.  2. Marked splenomegaly measuring 24 cm in craniocaudal dimension.  3. Recanalization of the umbilical vein  4.  Interval development of abdominal and pelvic ascites.  5. Fluid filled umbilical hernia. This has increased in size from  previous exam.  6. Right renal calculus  7. Esophageal varices  Electronically Signed  By: Signa Kell M.D.  On: 07/02/2013 10:38  Assessment     2 cm umbilical hernia without significant symptoms Patient Active Problem List   Diagnosis Date Noted  . Coronary artery disease 06/07/2011  . S/P gastric bypass 06/07/2011  . COLONIC POLYPS 11/20/2007  . DIABETES MELLITUS, TYPE II, CONTROLLED 11/20/2007  . DYSLIPIDEMIA 11/20/2007  . IRON DEFICIENCY ANEMIA SECONDARY TO BLOOD LOSS 11/20/2007  . HYPERTENSION 11/20/2007  . INTERNAL HEMORRHOIDS 11/20/2007  . DIVERTICULOSIS, COLON 11/20/2007  . GENERALIZED OSTEOARTHROSIS UNSPECIFIED SITE 11/20/2007  . SLEEP APNEA 11/20/2007  . RENAL CALCULUS, HX OF 11/20/2007  cirrhosis    Plan    Discussed surgical intervention for paraumbilical hernia. Discussed the implications of his underlying liver disease and other medical problems on outcomes for hernia repair. He does have cirrhosis, esophageal varices and now ascites which is mild on CT scan. Operative risk repairing the multiple hernias in patients with cirrhosis and ascites are 20-30%  and have high failure rates. Other potential complications of peritonitis, exacerbation of underlying medical problems and the need for other operations discussed. He is having no symptoms. The defect measures 2 cm in maximal diameter. After discussing this with the patient and his wife, I recommend observation for the time being with close followup with me every 6 months or if he develops symptoms at the hernia site. I gave him information about cirrhosis, ascites and low sodium diet. Information about hernias given as well today. Signs and symptoms of incarceration strangulation discussed and the risk in this situation is about 1-2% over a five-year period.       Robert Sperl A. 07/05/2013, 10:26 AM

## 2013-07-21 ENCOUNTER — Ambulatory Visit (INDEPENDENT_AMBULATORY_CARE_PROVIDER_SITE_OTHER): Payer: Medicare Other | Admitting: Internal Medicine

## 2013-07-21 ENCOUNTER — Telehealth: Payer: Self-pay | Admitting: Oncology

## 2013-07-21 ENCOUNTER — Encounter: Payer: Self-pay | Admitting: Internal Medicine

## 2013-07-21 ENCOUNTER — Ambulatory Visit (HOSPITAL_BASED_OUTPATIENT_CLINIC_OR_DEPARTMENT_OTHER): Payer: Medicare Other | Admitting: Oncology

## 2013-07-21 ENCOUNTER — Other Ambulatory Visit (HOSPITAL_BASED_OUTPATIENT_CLINIC_OR_DEPARTMENT_OTHER): Payer: Medicare Other | Admitting: Lab

## 2013-07-21 VITALS — BP 114/68 | HR 76 | Ht 71.75 in | Wt 221.2 lb

## 2013-07-21 VITALS — BP 129/59 | HR 58 | Temp 98.0°F | Resp 20 | Ht 71.75 in | Wt 220.1 lb

## 2013-07-21 DIAGNOSIS — D509 Iron deficiency anemia, unspecified: Secondary | ICD-10-CM

## 2013-07-21 DIAGNOSIS — D500 Iron deficiency anemia secondary to blood loss (chronic): Secondary | ICD-10-CM

## 2013-07-21 DIAGNOSIS — D696 Thrombocytopenia, unspecified: Secondary | ICD-10-CM

## 2013-07-21 DIAGNOSIS — R188 Other ascites: Secondary | ICD-10-CM

## 2013-07-21 DIAGNOSIS — K766 Portal hypertension: Secondary | ICD-10-CM

## 2013-07-21 DIAGNOSIS — Z9884 Bariatric surgery status: Secondary | ICD-10-CM

## 2013-07-21 DIAGNOSIS — D731 Hypersplenism: Secondary | ICD-10-CM

## 2013-07-21 DIAGNOSIS — K7460 Unspecified cirrhosis of liver: Secondary | ICD-10-CM

## 2013-07-21 DIAGNOSIS — I8500 Esophageal varices without bleeding: Secondary | ICD-10-CM

## 2013-07-21 LAB — COMPREHENSIVE METABOLIC PANEL (CC13)
ALT: 29 U/L (ref 0–55)
Anion Gap: 7 mEq/L (ref 3–11)
BUN: 20.5 mg/dL (ref 7.0–26.0)
CO2: 22 mEq/L (ref 22–29)
Calcium: 9.2 mg/dL (ref 8.4–10.4)
Chloride: 110 mEq/L — ABNORMAL HIGH (ref 98–109)
Glucose: 179 mg/dl — ABNORMAL HIGH (ref 70–140)
Potassium: 4.3 mEq/L (ref 3.5–5.1)
Sodium: 139 mEq/L (ref 136–145)
Total Protein: 6.4 g/dL (ref 6.4–8.3)

## 2013-07-21 LAB — CBC WITH DIFFERENTIAL/PLATELET
BASO%: 0.6 % (ref 0.0–2.0)
Basophils Absolute: 0 10*3/uL (ref 0.0–0.1)
Eosinophils Absolute: 0.1 10*3/uL (ref 0.0–0.5)
HCT: 39.6 % (ref 38.4–49.9)
LYMPH%: 15.2 % (ref 14.0–49.0)
MCHC: 34.7 g/dL (ref 32.0–36.0)
MONO#: 0.2 10*3/uL (ref 0.1–0.9)
NEUT#: 2.5 10*3/uL (ref 1.5–6.5)
NEUT%: 76 % — ABNORMAL HIGH (ref 39.0–75.0)
Platelets: 42 10*3/uL — ABNORMAL LOW (ref 140–400)
RBC: 4.55 10*6/uL (ref 4.20–5.82)
RDW: 14.1 % (ref 11.0–14.6)
WBC: 3.3 10*3/uL — ABNORMAL LOW (ref 4.0–10.3)
lymph#: 0.5 10*3/uL — ABNORMAL LOW (ref 0.9–3.3)

## 2013-07-21 LAB — FERRITIN CHCC: Ferritin: 185 ng/ml (ref 22–316)

## 2013-07-21 NOTE — Patient Instructions (Signed)
You have been scheduled for an abdominal ultrasound at [facility] (1st floor of hospital) on 07/23/2013 at 8:00am. Please arrive 15 minutes prior to your appointment for registration. Make certain not to have anything to eat or drink after midnight prior to your appointment. Should you need to reschedule your appointment, please contact radiology at [phone]. This test typically takes about 30 minutes to perform.  Please follow up with Dr. Marina Goodell in 4-5 weeks

## 2013-07-21 NOTE — Progress Notes (Signed)
Hematology and Oncology Follow Up Visit  [name] House [id number] Apr 23, 1946 67 y.o. 07/21/2013 8:51 AM  Principle Diagnosis: This is a 67 year old gentleman with the following diagnoses:  1. Iron deficiency anemia.  He has iron losses through the GI tract due to a history of gastric bypass operation.  He has been receiving intermittent iron replacement, most recently of which was in April 2011.  2.    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   Interim History: [name] House presents today for a followup visit.  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  He has continued to be in excellent health and shape.  He has continued to perform his work-related duties without any major problems.  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  He does report fatigue and tiredness at the end of the day, but had not reported any abdominal pain, had not reported any diarrhea, had not reported any epistaxis or hematochezia.  He does report some occasional abdominal distention and gas, but this is again not associated with any nausea, not associated with any vomiting.   He was able to lose close to 35 pounds after he changed to a salt free diet which have helped his overall fluid retention. He was also evaluated for a possible hernia repair which was deferred for the time being due to his liver disease.  Medications: I have reviewed the patient's current medications. Current Outpatient Prescriptions  Medication Sig Dispense Refill  . Cholecalciferol (VITAMIN D3) 2000 UNITS TABS Take 1 capsule by mouth daily.        . diphenoxylate-atropine (LOMOTIL) 2.5-0.025 MG per tablet Take 1 tablet by mouth 4 (four) times daily as needed for diarrhea or loose stools.      . Ferrous Sulfate Dried (GNP IRON) 200 (65 FE) MG TABS Take 1 tablet by mouth daily.         Marland Kitchen lisinopril (PRINIVIL,ZESTRIL) 20 MG tablet Take 20 mg by mouth daily.      . Multiple Vitamins-Minerals (MULTIVITAMINS THER. W/MINERALS) TABS Take 1 tablet by mouth daily.        . nadolol (CORGARD) 20 MG tablet TAKE 1 TABLET (20 MG TOTAL) BY MOUTH DAILY.  30 tablet  6   No current facility-administered medications for this visit.     Allergies:  Allergies  Allergen Reactions  . Oxycodone Other (See Comments)    Caused him to fall..really sedated    Past Medical History, Surgical history, Social history, and Family History were reviewed and updated.  Review of Systems:  Remaining ROS negative. Physical Exam: Blood pressure 129/59, pulse 58, temperature 98 F (36.7 C), temperature source Oral, resp. rate 20, height 5' 11.75" (1.822 m), weight 220 lb 1.6 oz (99.837 kg). ECOG: 0 General appearance: alert Head: Normocephalic, without obvious abnormality, atraumatic Neck: no adenopathy, no carotid bruit, no JVD, supple, symmetrical, trachea midline and thyroid not enlarged, symmetric, no tenderness/mass/nodules Lymph nodes: Cervical, supraclavicular, and axillary nodes normal. Heart:regular rate and rhythm, S1, S2 normal, no murmur, click, rub or gallop Lung:chest clear, no wheezing, rales, normal symmetric air entry Abdomin: soft, non-tender, without masses or organomegaly EXT:no erythema, induration, or nodules   Lab Results: Lab Results  Component Value Date   WBC 3.3* 07/21/2013   HGB 13.8 07/21/2013   HCT 39.6 07/21/2013   MCV 87.2 07/21/2013   PLT 42*  07/21/2013     Chemistry      Component Value Date/Time   NA 141 01/13/2013 0840   NA 138 01/16/2012 1459   K 4.2 01/13/2013 0840   K 4.6 01/16/2012 1459   CL 112* 01/13/2013 0840   CL 109 01/16/2012 1459   CO2 21* 01/13/2013 0840   CO2 21 01/16/2012 1459   BUN 19.7 01/13/2013 0840   BUN 24* 01/16/2012 1459   CREATININE 1.1 01/13/2013 0840   CREATININE 1.35 01/16/2012 1459      Component Value Date/Time   CALCIUM 8.6  01/13/2013 0840   CALCIUM 9.6 01/16/2012 1459   ALKPHOS 144 01/13/2013 0840   ALKPHOS 138* 01/16/2012 1459   AST 33 01/13/2013 0840   AST 29 01/16/2012 1459   ALT 35 01/13/2013 0840   ALT 33 01/16/2012 1459   BILITOT 1.47* 01/13/2013 0840   BILITOT 0.7 01/16/2012 1459       Impression and Plan:  [name] with the following issues:  1. Thrombocytopenia, differential diagnosis including hypersplenism due to portal hypertension and liver disease.  At this time, he does not really require any intervention.  He has no evidence of any bleeding.  He has adequate platelets.  Continue to monitor him at the time being.   2.     Iron deficiency anemia.  His hemoglobin is adequate at this time.  I am rechecking his iron stores and certainly if they are drifting down, I will set him up for Feraheme IV iron infusion.    Jonell Krontz, MD 10/15/20148:51 AM

## 2013-07-21 NOTE — Telephone Encounter (Signed)
gv adn printed appt sched and avs for pt for April 2015 °

## 2013-07-21 NOTE — Progress Notes (Signed)
HISTORY OF PRESENT ILLNESS:  [name] House is a 67 y.o. 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 Negative evaluation for other etiologies. Last upper endoscopy December 2013 revealing 1-2+ varices (had progressed) for which he was placed on nadolol. Last seen in the office 09/21/2012. He also has a history of multiple adenomatous colon polyps and is due for surveillance next year. He is sent today at the request of his primary provider regarding recent CT scan findings which demonstrated moderate ascites. He was seen by a general surgeon regarding umbilical hernia repair. He was turned down secondary to comorbidities. He was advised with regards to low-sodium diet. Since that time (approximately 3 weeks ago) he has had 12-14 pound weight loss. He is not on diuretics. Patient's GI review of systems is otherwise negative. No evidence for GI bleeding, encephalopathy, or fluid accumulation elsewhere. He is accompanied by his wife.  REVIEW OF SYSTEMS:  All non-GI ROS negative   Past Medical History  Diagnosis Date  . Diabetes mellitus, type 2     resolved with gastric bypass  . Hyperlipemia   . Kidney stones   . Osteoarthritis of hip   . Hypertension   . Benign neoplasm of colon   . Diverticulosis of colon (without mention of hemorrhage)   . Iron deficiency anemia secondary to blood loss (chronic)   . Unspecified sleep apnea     Resolved  . Cirrhosis   . Liver cyst   . Renal cyst   . Esophageal varices     Past Surgical History  Procedure Laterality Date  . Knee surgery    . Ankle surgery    . Gastric bypass  2005  . Total hip arthroplasty  2008  . Colonoscopy  2012  . Polypectomy      Social History [name] House  reports that he  has never smoked. He has never used smokeless tobacco. He reports that he drinks alcohol. He reports that he does not use illicit drugs.  family history includes Coronary artery disease in his brother; Diabetes in his mother; Heart attack in his father; Heart disease in his father. There is no history of Colon cancer or Stomach cancer.  Allergies  Allergen Reactions  . Oxycodone Other (See Comments)    Caused him to fall..really sedated       PHYSICAL EXAMINATION: Vital signs: BP 114/68  Pulse 76  Ht 5' 11.75" (1.822 m)  Wt 221 lb 3.2 oz (100.336 kg)  BMI 30.22 kg/m2  Constitutional: Obese, otherwise generally well-appearing, no acute distress Psychiatric: alert and oriented x3, cooperative Eyes: extraocular movements intact, anicteric, conjunctiva pink Mouth: oral pharynx moist, no lesions Neck: supple no lymphadenopathy Cardiovascular: heart regular rate and rhythm, no murmur Lungs: clear to auscultation bilaterally Abdomen: soft obese,, nontender, nondistended, probable ascites-though non-tense, no peritoneal signs, normal bowel sounds, palpable spleen tip Rectal: Omitted Extremities: no lower extremity edema bilaterally Skin: no lesions on visible extremities Neuro: No focal deficits. No asterixis.   ASSESSMENT:  #1. Hepatic cirrhosis with portal hypertension secondary NASH. #2. Decompensated with ascites. Improvement in low-sodium diet. May have residual ascites good long discussion today on the pathophysiology of ascites and the indications for treatment. #3. Known esophageal varices. On primary prophylaxis with nadolol #4. History of multiple adenomatous. Last colonoscopy September 2012. #5. Multiple significant  medical problems. Stable   PLAN:  #1. Abdominal ultrasound to evaluate for significant residual ascites. If present, prescribe diuretic therapy #2. Continue low-sodium diet. 2 g currently #3. Continue nadolol #4. Due for surveillance upper endoscopy around  December 2014. Address at next visit #5. Surveillance colonoscopy around September 2015 if medically fit

## 2013-07-23 ENCOUNTER — Other Ambulatory Visit: Payer: Self-pay | Admitting: Internal Medicine

## 2013-07-23 ENCOUNTER — Ambulatory Visit (HOSPITAL_COMMUNITY)
Admission: RE | Admit: 2013-07-23 | Discharge: 2013-07-23 | Disposition: A | Discharge status: Home / Self Care | Payer: Medicare Other | Source: Ambulatory Visit | Attending: Internal Medicine | Admitting: Internal Medicine

## 2013-07-23 DIAGNOSIS — R161 Splenomegaly, not elsewhere classified: Secondary | ICD-10-CM | POA: Insufficient documentation

## 2013-07-23 DIAGNOSIS — R188 Other ascites: Secondary | ICD-10-CM

## 2013-08-12 ENCOUNTER — Other Ambulatory Visit: Payer: Self-pay

## 2013-08-25 ENCOUNTER — Encounter: Payer: Self-pay | Admitting: Internal Medicine

## 2013-08-25 ENCOUNTER — Ambulatory Visit (INDEPENDENT_AMBULATORY_CARE_PROVIDER_SITE_OTHER): Payer: Medicare Other | Admitting: Internal Medicine

## 2013-08-25 VITALS — BP 118/66 | HR 60 | Ht 71.5 in | Wt 226.1 lb

## 2013-08-25 DIAGNOSIS — I8500 Esophageal varices without bleeding: Secondary | ICD-10-CM

## 2013-08-25 DIAGNOSIS — K766 Portal hypertension: Secondary | ICD-10-CM

## 2013-08-25 DIAGNOSIS — Z860100 Personal history of colon polyps, unspecified: Secondary | ICD-10-CM

## 2013-08-25 DIAGNOSIS — K7460 Unspecified cirrhosis of liver: Secondary | ICD-10-CM

## 2013-08-25 DIAGNOSIS — D509 Iron deficiency anemia, unspecified: Secondary | ICD-10-CM

## 2013-08-25 MED ORDER — NADOLOL 20 MG PO TABS: 20.0000 mg | ORAL_TABLET | Freq: Every day | ORAL | Status: DC

## 2013-08-25 NOTE — Progress Notes (Signed)
HISTORY OF PRESENT ILLNESS:  [name] House is a 67 y.o. male with multiple significant medical problems including hypertension, hyperlipidemia, diabetes mellitus, sleep apnea, morbid obesity status post Roux-en-Y gastric bypass surgery 2005, osteoarthritis, chronic thrombocytopenia, and hepatic cirrhosis with portal hypertension felt to be on the basis of NASH. He was last evaluated in the office 07/21/2013 after having had developed ascites. Symptoms improved with low-sodium diet only. He did undergo an abdominal ultrasound to assess for the presence of ascites. Fortunately, no ascites on ultrasound performed 07/23/2013. He has continued on low-sodium diet with only few pound weight gain since his last visit. He continues on nadolol for primary prophylaxis of esophageal varices. Last upper endoscopy December 2013. He is accompanied by his wife. No interval problems or other questions.  REVIEW OF SYSTEMS:  All non-GI ROS negative except for arthritis  Past Medical History  Diagnosis Date  . Diabetes mellitus, type 2     resolved with gastric bypass  . Hyperlipemia   . Kidney stones   . Osteoarthritis of hip   . Hypertension   . Benign neoplasm of colon   . Diverticulosis of colon (without mention of hemorrhage)   . Iron deficiency anemia secondary to blood loss (chronic)   . Unspecified sleep apnea     Resolved  . Cirrhosis   . Liver cyst   . Renal cyst   . Esophageal varices     Past Surgical History  Procedure Laterality Date  . Knee surgery    . Ankle surgery    . Gastric bypass  2005  . Total hip arthroplasty  2008  . Colonoscopy  2012  . Polypectomy      Social History PARTHIV MUCCI  reports that he has never smoked. He has never used smokeless tobacco. He reports that he drinks alcohol. He reports that he does not use illicit drugs.  family history includes Coronary artery disease in his brother; Diabetes in his mother; Heart attack in his father; Heart disease in his  father. There is no history of Colon cancer or Stomach cancer.  Allergies  Allergen Reactions  . Oxycodone Other (See Comments)    Caused him to fall..really sedated       PHYSICAL EXAMINATION: Vital signs: BP 118/66  Pulse 60  Ht 5' 11.5" (1.816 m)  Wt 226 lb 2 oz (102.57 kg)  BMI 31.10 kg/m2 General: Well-developed, obese, well-nourished, no acute distress HEENT: Sclerae are anicteric, conjunctiva pink. Oral mucosa intact Lungs: Clear Heart: Regular Abdomen: soft, obese, nontender, nondistended, no obvious ascites, no peritoneal signs, normal bowel sounds. No organomegaly. Prior surgical incisions well-healed Extremities: No edema Psychiatric: alert and oriented x3. Cooperative NEURO: No asterixis  ASSESSMENT:  #1. Hepatic cirrhosis with portal hypertension secondary NASH #2. Recent problems with ascites resolved with sodium restriction only. #3. Known esophageal varices. On primary prophylaxis with nadolol. Last upper endoscopy December 2013 #4. History of multiple adenomatous. Last colonoscopy September 2012. Due for followup around September 2015 if medically fit. #5. History of iron deficiency anemia. On iron supplementation #6. Multiple medical problems. Stable   PLAN:  #1. Continue nadolol. Prescription refilled #2. EGD to reassess varices. The patient is high-risk given his comorbidities.The nature of the procedure, as well as the risks, benefits, and alternatives were carefully and thoroughly reviewed with the patient. Ample time for discussion and questions allowed. The patient understood, was satisfied, and agreed to proceed.. May need to adjust nadolol depending upon variceal appearance #3. Continue sodium restriction and  daily weights measured at home as he is doing #4. Routine GI office followup, assuming he is doing well, in 6 months. #5. Consider surveillance colonoscopy around September 2015 #6. Continue iron supplementation #7. Ongoing general medical  care with Dr. Eloise Harman

## 2013-08-25 NOTE — Patient Instructions (Signed)

## 2013-08-27 ENCOUNTER — Encounter: Payer: Self-pay | Admitting: Internal Medicine

## 2013-08-30 ENCOUNTER — Encounter: Payer: Self-pay | Admitting: Internal Medicine

## 2013-08-30 ENCOUNTER — Ambulatory Visit (AMBULATORY_SURGERY_CENTER): Payer: Medicare Other | Admitting: Internal Medicine

## 2013-08-30 VITALS — BP 110/60 | HR 58 | Temp 97.4°F | Resp 19 | Ht 71.5 in | Wt 226.0 lb

## 2013-08-30 DIAGNOSIS — I8500 Esophageal varices without bleeding: Secondary | ICD-10-CM

## 2013-08-30 MED ORDER — SODIUM CHLORIDE 0.9 % IV SOLN: 500.0000 mL | INTRAVENOUS | Status: DC

## 2013-08-30 NOTE — Progress Notes (Signed)
Procedure ends, to recovery, report given and VSS. 

## 2013-08-30 NOTE — Patient Instructions (Signed)
YOU HAD AN ENDOSCOPIC PROCEDURE TODAY AT THE [facility]: Refer to the procedure report that was given to you for any specific questions about what was found during the examination.  If the procedure report does not answer your questions, please call your gastroenterologist to clarify.  If you requested that your care partner not be given the details of your procedure findings, then the procedure report has been included in a sealed envelope for you to review at your convenience later.  YOU SHOULD EXPECT: Some feelings of bloating in the abdomen. Passage of more gas than usual.  Walking can help get rid of the air that was put into your GI tract during the procedure and reduce the bloating. If you had a lower endoscopy (such as a colonoscopy or flexible sigmoidoscopy) you may notice spotting of blood in your stool or on the toilet paper. If you underwent a bowel prep for your procedure, then you may not have a normal bowel movement for a few days.  DIET: Your first meal following the procedure should be a light meal and then it is ok to progress to your normal diet.  A half-sandwich or bowl of soup is an example of a good first meal.  Heavy or fried foods are harder to digest and may make you feel nauseous or bloated.  Likewise meals heavy in dairy and vegetables can cause extra gas to form and this can also increase the bloating.  Drink plenty of fluids but you should avoid alcoholic beverages for 24 hours.  ACTIVITY: Your care partner should take you home directly after the procedure.  You should plan to take it easy, moving slowly for the rest of the day.  You can resume normal activity the day after the procedure however you should NOT DRIVE or use heavy machinery for 24 hours (because of the sedation medicines used during the test).    SYMPTOMS TO REPORT IMMEDIATELY: A gastroenterologist can be reached at any hour.  During normal business hours, 8:30 AM to 5:00 PM Monday through Friday,  call [phone].  After hours and on weekends, please call the GI answering service at [phone] who will take a message and have the physician on call contact you.   Following upper endoscopy (EGD)  Vomiting of blood or coffee ground material  New chest pain or pain under the shoulder blades  Painful or persistently difficult swallowing  New shortness of breath  Fever of 100F or higher  Black, tarry-looking stools  FOLLOW UP: If any biopsies were taken you will be contacted by phone or by letter within the next 1-3 weeks.  Call your gastroenterologist if you have not heard about the biopsies in 3 weeks.  Our staff will call the home number listed on your records the next business day following your procedure to check on you and address any questions or concerns that you may have at that time regarding the information given to you following your procedure. This is a courtesy call and so if there is no answer at the home number and we have not heard from you through the emergency physician on call, we will assume that you have returned to your regular daily activities without incident.  SIGNATURES/CONFIDENTIALITY: You and/or your care partner have signed paperwork which will be entered into your electronic medical record.  These signatures attest to the fact that that the information above on your After Visit Summary has been reviewed and is understood.  Full responsibility   of the confidentiality of this discharge information lies with you and/or your care-partner.  Resume medications. 

## 2013-08-30 NOTE — Progress Notes (Signed)
Patient did not experience any of the following events: a burn prior to discharge; a fall within the facility; wrong site/side/patient/procedure/implant event; or a hospital transfer or hospital admission upon discharge from the facility. (G8907) Patient did not have preoperative order for IV antibiotic SSI prophylaxis. (G8918)  

## 2013-08-30 NOTE — Op Note (Signed)
[facility] [address] [place], [address]   ENDOSCOPY PROCEDURE REPORT  PATIENT: Clete, Kuch  MR#: [id number] BIRTHDATE: 05/15/1946 , 67  yrs. old GENDER: Male ENDOSCOPIST: Roxy Cedar, MD REFERRED BY:  .  Self / Office PROCEDURE DATE:  08/30/2013 PROCEDURE:  EGD, diagnostic ASA CLASS:     Class III INDICATIONS:  Surveillance.  Varices. Last exam 09-2012 MEDICATIONS: MAC sedation, administered by CRNA and propofol (Diprivan) 150mg  IV TOPICAL ANESTHETIC: Cetacaine Spray  DESCRIPTION OF PROCEDURE: After the risks benefits and alternatives of the procedure were thoroughly explained, informed consent was obtained.  The LB [id number] endoscope was introduced through the mouth and advanced to the proximal jejunum. Without limitations.  The instrument was slowly withdrawn as the mucosa was fully examined.      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  Retroflexion was not performed.     The scope was then withdrawn from the patient and the procedure completed.  COMPLICATIONS: There were no complications.  ENDOSCOPIC IMPRESSION: 1. 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 2. 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  RECOMMENDATIONS: 1. Continue current meds  REPEAT EXAM:  eSigned:  Roxy Cedar, MD 08/30/2013 8:59 AM   CC:The Patient and Jarome Matin, MD

## 2013-08-31 ENCOUNTER — Telehealth: Payer: Self-pay | Admitting: *Deleted

## 2013-08-31 NOTE — Telephone Encounter (Signed)
  Follow up Call-  Call back number 08/30/2013 09/07/2012 08/23/2011 07/01/2011  Post procedure Call Back phone  # [phone] to leave message [phone]  Permission to leave phone message Yes Yes - -     Patient questions:  Do you have a fever, pain , or abdominal swelling? no Pain Score  0 *  Have you tolerated food without any problems? yes  Have you been able to return to your normal activities? yes  Do you have any questions about your discharge instructions: Diet   no Medications  no Follow up visit  no  Do you have questions or concerns about your Care? no  Actions: * If pain score is 4 or above: No action needed, pain <4.

## 2013-09-23 ENCOUNTER — Other Ambulatory Visit: Payer: Self-pay | Admitting: Dermatology

## 2013-10-11 ENCOUNTER — Other Ambulatory Visit: Payer: Self-pay | Admitting: Dermatology

## 2013-11-24 ENCOUNTER — Encounter (INDEPENDENT_AMBULATORY_CARE_PROVIDER_SITE_OTHER): Payer: Self-pay | Admitting: Surgery

## 2013-12-01 ENCOUNTER — Other Ambulatory Visit: Payer: Self-pay | Admitting: Internal Medicine

## 2013-12-03 ENCOUNTER — Telehealth: Payer: Self-pay | Admitting: Internal Medicine

## 2013-12-03 MED ORDER — NADOLOL 20 MG PO TABS: 20.0000 mg | ORAL_TABLET | Freq: Every day | ORAL | Status: DC

## 2013-12-03 NOTE — Telephone Encounter (Signed)
Per pharmacy, they never got original script sent on 08/2013 for nadolol. I will send a 90 day rx since that script was never received.

## 2014-01-26 ENCOUNTER — Telehealth: Payer: Self-pay | Admitting: Oncology

## 2014-01-26 ENCOUNTER — Other Ambulatory Visit (HOSPITAL_BASED_OUTPATIENT_CLINIC_OR_DEPARTMENT_OTHER): Payer: Medicare Other

## 2014-01-26 ENCOUNTER — Encounter: Payer: Self-pay | Admitting: Oncology

## 2014-01-26 ENCOUNTER — Ambulatory Visit (HOSPITAL_BASED_OUTPATIENT_CLINIC_OR_DEPARTMENT_OTHER): Payer: Medicare Other | Admitting: Oncology

## 2014-01-26 ENCOUNTER — Telehealth: Payer: Self-pay | Admitting: *Deleted

## 2014-01-26 VITALS — BP 129/64 | HR 57 | Temp 98.5°F | Resp 18 | Ht 71.5 in | Wt 234.2 lb

## 2014-01-26 DIAGNOSIS — D696 Thrombocytopenia, unspecified: Secondary | ICD-10-CM

## 2014-01-26 DIAGNOSIS — D509 Iron deficiency anemia, unspecified: Secondary | ICD-10-CM

## 2014-01-26 DIAGNOSIS — D500 Iron deficiency anemia secondary to blood loss (chronic): Secondary | ICD-10-CM

## 2014-01-26 LAB — COMPREHENSIVE METABOLIC PANEL (CC13)
ALT: 40 U/L (ref 0–55)
AST: 38 U/L — ABNORMAL HIGH (ref 5–34)
Albumin: 3.7 g/dL (ref 3.5–5.0)
Alkaline Phosphatase: 148 U/L (ref 40–150)
Anion Gap: 7 mEq/L (ref 3–11)
BILIRUBIN TOTAL: 1.7 mg/dL — AB (ref 0.20–1.20)
BUN: 17.8 mg/dL (ref 7.0–26.0)
CO2: 22 mEq/L (ref 22–29)
CREATININE: 1.1 mg/dL (ref 0.7–1.3)
Calcium: 9.2 mg/dL (ref 8.4–10.4)
Chloride: 111 mEq/L — ABNORMAL HIGH (ref 98–109)
Glucose: 168 mg/dl — ABNORMAL HIGH (ref 70–140)
Potassium: 4.3 mEq/L (ref 3.5–5.1)
SODIUM: 141 meq/L (ref 136–145)
TOTAL PROTEIN: 6 g/dL — AB (ref 6.4–8.3)

## 2014-01-26 LAB — CBC WITH DIFFERENTIAL/PLATELET
BASO%: 0.3 % (ref 0.0–2.0)
Basophils Absolute: 0 10*3/uL (ref 0.0–0.1)
EOS%: 2.9 % (ref 0.0–7.0)
Eosinophils Absolute: 0.1 10*3/uL (ref 0.0–0.5)
HEMATOCRIT: 38.1 % — AB (ref 38.4–49.9)
HGB: 12.9 g/dL — ABNORMAL LOW (ref 13.0–17.1)
LYMPH#: 0.4 10*3/uL — AB (ref 0.9–3.3)
LYMPH%: 11.4 % — ABNORMAL LOW (ref 14.0–49.0)
MCH: 30.4 pg (ref 27.2–33.4)
MCHC: 33.9 g/dL (ref 32.0–36.0)
MCV: 89.6 fL (ref 79.3–98.0)
MONO#: 0.3 10*3/uL (ref 0.1–0.9)
MONO%: 7.2 % (ref 0.0–14.0)
NEUT#: 2.7 10*3/uL (ref 1.5–6.5)
NEUT%: 78.2 % — AB (ref 39.0–75.0)
Platelets: 52 10*3/uL — ABNORMAL LOW (ref 140–400)
RBC: 4.25 10*6/uL (ref 4.20–5.82)
RDW: 14.2 % (ref 11.0–14.6)
WBC: 3.5 10*3/uL — AB (ref 4.0–10.3)

## 2014-01-26 LAB — IRON AND TIBC CHCC
%SAT: 22 % (ref 20–55)
Iron: 66 ug/dL (ref 42–163)
TIBC: 298 ug/dL (ref 202–409)
UIBC: 232 ug/dL (ref 117–376)

## 2014-01-26 LAB — FERRITIN CHCC: Ferritin: 81 ng/ml (ref 22–316)

## 2014-01-26 NOTE — Progress Notes (Signed)
Hematology and Oncology Follow Up Visit  [name] House [id number] Jan 24, 1946 68 y.o. 01/26/2014 9:37 AM  Principle Diagnosis: This is a 68 year old gentleman with the following diagnoses:  1. Iron deficiency anemia.  He has iron losses through the GI tract due to a history of gastric bypass operation.  He has been receiving intermittent iron replacement, most recently of which was in April 2011.  2.    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   Interim History: [name] House presents today for a followup visit.  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  He has continued to be in excellent health and shape.  He has continued to perform his work-related duties without any major problems. He is reporting more fatigue and tiredness at this time more than his usual, but had not reported any abdominal pain, had not reported any diarrhea, had not reported any epistaxis or hematochezia.  He does report some occasional abdominal distention and gas, but this is again not associated with any nausea, not associated with any vomiting.  He continues to be very active but reports his exercise tolerance has declined significantly as of late. He has not reported any headaches blurred vision double vision or other mental status. Has not reported any chest pain shortness of breath or cough or hemoptysis. He has not reported any petechiae or or lymphadenopathy. Has not reported any frequency urgency or hematuria.   Medications: I have reviewed the patient's current medications. Current Outpatient Prescriptions  Medication Sig Dispense Refill  . Cholecalciferol (VITAMIN D3) 2000 UNITS TABS Take 1 capsule by mouth daily.        . diphenoxylate-atropine (LOMOTIL) 2.5-0.025 MG per tablet Take 1 tablet by mouth 4 (four) times daily as needed for diarrhea or loose stools.      . Ferrous Sulfate Dried  (GNP IRON) 200 (65 FE) MG TABS Take 1 tablet by mouth daily.        Marland Kitchen lisinopril (PRINIVIL,ZESTRIL) 20 MG tablet Take 20 mg by mouth daily.      . Multiple Vitamins-Minerals (MULTIVITAMINS THER. W/MINERALS) TABS Take 1 tablet by mouth daily.        . nadolol (CORGARD) 20 MG tablet Take 1 tablet (20 mg total) by mouth daily.  90 tablet  0   No current facility-administered medications for this visit.     Allergies:  Allergies  Allergen Reactions  . Oxycodone Other (See Comments)    Caused him to fall..really sedated    Past Medical History, Surgical history, Social history, and Family History were reviewed and updated.  Review of Systems:  Remaining ROS negative. Physical Exam:  Blood pressure 129/64, pulse 57, temperature 98.5 F (36.9 C), temperature source Oral, resp. rate 18, height 5' 11.5" (1.816 m), weight 234 lb 3.2 oz (106.232 kg). ECOG: 0 General appearance: alert awake appeared in no active distress Head: Normocephalic, without obvious abnormality, atraumatic Neck: no adenopathy, no carotid bruit, no JVD, supple, symmetrical, trachea midline and thyroid not enlarged, symmetric, no tenderness/mass/nodules Lymph nodes: Cervical, supraclavicular, and axillary nodes normal. Heart:regular rate and rhythm, S1, S2 normal, no murmur, click, rub or gallop Lung:chest clear, no wheezing, rales, normal symmetric air entry Abdomin: soft, non-tender, without masses or organomegaly EXT:no erythema, induration, or nodules   Lab Results: Lab Results  Component Value Date   WBC 3.5* 01/26/2014   HGB 12.9* 01/26/2014   HCT 38.1* 01/26/2014  MCV 89.6 01/26/2014   PLT 52* 01/26/2014     Chemistry      Component Value Date/Time   NA 139 07/21/2013 0827   NA 138 01/16/2012 1459   K 4.3 07/21/2013 0827   K 4.6 01/16/2012 1459   CL 112* 01/13/2013 0840   CL 109 01/16/2012 1459   CO2 22 07/21/2013 0827   CO2 21 01/16/2012 1459   BUN 20.5 07/21/2013 0827   BUN 24* 01/16/2012 1459    CREATININE 1.2 07/21/2013 0827   CREATININE 1.35 01/16/2012 1459      Component Value Date/Time   CALCIUM 9.2 07/21/2013 0827   CALCIUM 9.6 01/16/2012 1459   ALKPHOS 119 07/21/2013 0827   ALKPHOS 138* 01/16/2012 1459   AST 31 07/21/2013 0827   AST 29 01/16/2012 1459   ALT 29 07/21/2013 0827   ALT 33 01/16/2012 1459   BILITOT 1.67* 07/21/2013 0827   BILITOT 0.7 01/16/2012 1459       Impression and Plan:  A 68 year old gentleman with the following issues:  1. Thrombocytopenia, differential diagnosis including hypersplenism due to portal hypertension and liver disease.  At this time, he does not really require any intervention.  He has no evidence of any bleeding.  He has adequate platelets.  Continue to monitor him at the time being.   2.     Iron deficiency anemia.  His hemoglobin appears to be declining at this time and am checking his iron studies. It is very possible that he is developing iron deficiency again related to his poor absorption from gastric bypass operation. Risks and benefits of IV iron was discussed again including Feraheme infusion. Complications include infusion-related complications more specifically more serious anaphylaxis has been reported. He is agreeable to proceed and we will set that up in the near future.  3.      Followup: Will be in 6 months.    Wyatt Portela, MD 4/22/20159:37 AM

## 2014-01-26 NOTE — Telephone Encounter (Signed)
Per POF and staff phone call message, I have scheduled appt

## 2014-01-26 NOTE — Telephone Encounter (Signed)
Gave pt appt for lab and MD for October 2015, left Sharyn Lull a VM for IVIron for 4/24

## 2014-01-28 ENCOUNTER — Ambulatory Visit (HOSPITAL_BASED_OUTPATIENT_CLINIC_OR_DEPARTMENT_OTHER): Payer: Medicare Other

## 2014-01-28 VITALS — BP 129/68 | HR 58 | Temp 97.7°F | Resp 18

## 2014-01-28 DIAGNOSIS — D509 Iron deficiency anemia, unspecified: Secondary | ICD-10-CM

## 2014-01-28 DIAGNOSIS — D500 Iron deficiency anemia secondary to blood loss (chronic): Secondary | ICD-10-CM

## 2014-01-28 MED ORDER — SODIUM CHLORIDE 0.9 % IV SOLN
1020.0000 mg | Freq: Once | INTRAVENOUS | Status: AC
  Administered 2014-01-28: 1020 mg via INTRAVENOUS
  Filled 2014-01-28: qty 34

## 2014-01-28 MED ORDER — SODIUM CHLORIDE 0.9 % IV SOLN
Freq: Once | INTRAVENOUS | Status: AC
  Administered 2014-01-28: 08:00:00 via INTRAVENOUS

## 2014-01-28 NOTE — Patient Instructions (Signed)

## 2014-01-28 NOTE — Progress Notes (Signed)
Patient observed 30 minutes post iron transfusion. No problems or complications noted. Pt discharged home. Cindi Carbon, RN

## 2014-03-16 ENCOUNTER — Other Ambulatory Visit: Payer: Self-pay | Admitting: Internal Medicine

## 2014-05-05 ENCOUNTER — Encounter: Payer: Self-pay | Admitting: Internal Medicine

## 2014-05-30 ENCOUNTER — Encounter: Payer: Self-pay | Admitting: Internal Medicine

## 2014-06-17 ENCOUNTER — Other Ambulatory Visit: Payer: Self-pay | Admitting: Internal Medicine

## 2014-07-21 ENCOUNTER — Ambulatory Visit (AMBULATORY_SURGERY_CENTER): Payer: Self-pay

## 2014-07-21 VITALS — Ht 72.0 in | Wt 244.6 lb

## 2014-07-21 DIAGNOSIS — Z86010 Personal history of colonic polyps: Secondary | ICD-10-CM

## 2014-07-21 MED ORDER — MOVIPREP 100 G PO SOLR: ORAL | Status: DC

## 2014-07-21 NOTE — Progress Notes (Signed)
Per pt, no allergies to soy or egg products.Pt not taking any weight loss meds or using  O2 at home. 

## 2014-07-22 ENCOUNTER — Other Ambulatory Visit: Payer: Self-pay

## 2014-07-28 ENCOUNTER — Ambulatory Visit (HOSPITAL_BASED_OUTPATIENT_CLINIC_OR_DEPARTMENT_OTHER): Payer: Medicare Other | Admitting: Oncology

## 2014-07-28 ENCOUNTER — Telehealth: Payer: Self-pay | Admitting: Oncology

## 2014-07-28 ENCOUNTER — Other Ambulatory Visit (HOSPITAL_BASED_OUTPATIENT_CLINIC_OR_DEPARTMENT_OTHER): Payer: Medicare Other

## 2014-07-28 VITALS — BP 149/64 | HR 59 | Temp 98.5°F | Resp 20 | Ht 72.0 in | Wt 243.3 lb

## 2014-07-28 DIAGNOSIS — D500 Iron deficiency anemia secondary to blood loss (chronic): Secondary | ICD-10-CM

## 2014-07-28 DIAGNOSIS — D509 Iron deficiency anemia, unspecified: Secondary | ICD-10-CM

## 2014-07-28 DIAGNOSIS — D696 Thrombocytopenia, unspecified: Secondary | ICD-10-CM

## 2014-07-28 LAB — COMPREHENSIVE METABOLIC PANEL (CC13)
ALBUMIN: 3.6 g/dL (ref 3.5–5.0)
ALT: 43 U/L (ref 0–55)
ANION GAP: 7 meq/L (ref 3–11)
AST: 36 U/L — AB (ref 5–34)
Alkaline Phosphatase: 143 U/L (ref 40–150)
BUN: 13.5 mg/dL (ref 7.0–26.0)
CALCIUM: 9 mg/dL (ref 8.4–10.4)
CHLORIDE: 111 meq/L — AB (ref 98–109)
CO2: 23 mEq/L (ref 22–29)
Creatinine: 1.1 mg/dL (ref 0.7–1.3)
Glucose: 204 mg/dl — ABNORMAL HIGH (ref 70–140)
POTASSIUM: 4.2 meq/L (ref 3.5–5.1)
Sodium: 141 mEq/L (ref 136–145)
Total Bilirubin: 1.88 mg/dL — ABNORMAL HIGH (ref 0.20–1.20)
Total Protein: 5.9 g/dL — ABNORMAL LOW (ref 6.4–8.3)

## 2014-07-28 LAB — CBC WITH DIFFERENTIAL/PLATELET
BASO%: 0.4 % (ref 0.0–2.0)
BASOS ABS: 0 10*3/uL (ref 0.0–0.1)
EOS ABS: 0.1 10*3/uL (ref 0.0–0.5)
EOS%: 4.1 % (ref 0.0–7.0)
HEMATOCRIT: 37.4 % — AB (ref 38.4–49.9)
HEMOGLOBIN: 12.5 g/dL — AB (ref 13.0–17.1)
LYMPH#: 0.4 10*3/uL — AB (ref 0.9–3.3)
LYMPH%: 13.1 % — ABNORMAL LOW (ref 14.0–49.0)
MCH: 30.5 pg (ref 27.2–33.4)
MCHC: 33.3 g/dL (ref 32.0–36.0)
MCV: 91.8 fL (ref 79.3–98.0)
MONO#: 0.2 10*3/uL (ref 0.1–0.9)
MONO%: 6.5 % (ref 0.0–14.0)
NEUT%: 75.9 % — AB (ref 39.0–75.0)
NEUTROS ABS: 2.1 10*3/uL (ref 1.5–6.5)
Platelets: 44 10*3/uL — ABNORMAL LOW (ref 140–400)
RBC: 4.08 10*6/uL — ABNORMAL LOW (ref 4.20–5.82)
RDW: 13.8 % (ref 11.0–14.6)
WBC: 2.8 10*3/uL — AB (ref 4.0–10.3)

## 2014-07-28 LAB — IRON AND TIBC CHCC
%SAT: 28 % (ref 20–55)
IRON: 84 ug/dL (ref 42–163)
TIBC: 299 ug/dL (ref 202–409)
UIBC: 215 ug/dL (ref 117–376)

## 2014-07-28 LAB — FERRITIN CHCC: Ferritin: 160 ng/ml (ref 22–316)

## 2014-07-28 NOTE — Telephone Encounter (Signed)
Pt confirmed labs/ov per 10/22 POF, gave pt AVS....... KJ °

## 2014-07-28 NOTE — Progress Notes (Signed)
Hematology and Oncology Follow Up Visit  RICHIE BONANNO [id number] 04/05/1946 68 y.o. 07/28/2014 8:56 AM  Principle Diagnosis: This is a 68 year old gentleman with the following diagnoses:  1. Iron deficiency anemia. This is due to a history of gastric bypass operation.  He has been receiving intermittent iron replacement, most recently of which was in 01/2014. 2.    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   Interim History: Mr. Mellinger presents today for a followup visit. Since last visit, he reports no new complaints. He has continued to be in excellent health and shape.  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 He did develop umbilical hernia and currently under evaluation for possible repair. He has not reported any any abdominal pain, had not reported any diarrhea, had not reported any epistaxis or hematochezia.  He does report some occasional abdominal distention and gas, but this is again not associated with any nausea, not associated with any vomiting. He has not reported any headaches blurred vision double vision or other mental status. Has not reported any chest pain shortness of breath or cough or hemoptysis. He has not reported any petechiae or or lymphadenopathy. Has not reported any frequency urgency or hematuria. Rest of his review of systems unremarkable.   Medications: I have reviewed the patient's current medications. Current Outpatient Prescriptions  Medication Sig Dispense Refill  . Cholecalciferol (VITAMIN D3) 2000 UNITS TABS Take 1 capsule by mouth daily.        . diphenoxylate-atropine (LOMOTIL) 2.5-0.025 MG per tablet Take 1 tablet by mouth daily.       Marland Kitchen lisinopril (PRINIVIL,ZESTRIL) 20 MG tablet Take 20 mg by mouth daily.      . Multiple Vitamins-Minerals (MULTIVITAMINS THER. W/MINERALS) TABS Take 1 tablet by mouth daily.        . nadolol (CORGARD) 20 MG  tablet TAKE 1 TABLET BY MOUTH EVERY DAY  90 tablet  0   No current facility-administered medications for this visit.     Allergies:  Allergies  Allergen Reactions  . Oxycodone Other (See Comments)    Caused him to fall..really sedated    Past Medical History, Surgical history, Social history, and Family History were reviewed and updated.   Physical Exam:  Blood pressure 149/64, pulse 59, temperature 98.5 F (36.9 C), temperature source Oral, resp. rate 20, height 6' (1.829 m), weight 243 lb 4.8 oz (110.36 kg). ECOG: 0 General appearance: alert awake appeared in no active distress Head: Normocephalic, without obvious abnormality, atraumatic Neck: no adenopathy Lymph nodes: Cervical, supraclavicular, and axillary nodes normal. Heart:regular rate and rhythm, S1, S2 normal, no murmur, click, rub or gallop Lung:chest clear, no wheezing, rales, normal symmetric air entry Abdomin: soft, non-tender, without masses or organomegaly. Small reducible umbilical hernia noted. EXT:no erythema, induration, or nodules   Lab Results: Lab Results  Component Value Date   WBC 2.8* 07/28/2014   HGB 12.5* 07/28/2014   HCT 37.4* 07/28/2014   MCV 91.8 07/28/2014   PLT 44* 07/28/2014     Chemistry      Component Value Date/Time   NA 141 01/26/2014 0904   NA 138 01/16/2012 1459   K 4.3 01/26/2014 0904   K 4.6 01/16/2012 1459   CL 112* 01/13/2013 0840   CL 109 01/16/2012 1459   CO2 22 01/26/2014 0904   CO2 21 01/16/2012 1459   BUN 17.8 01/26/2014 0904   BUN 24* 01/16/2012 1459  CREATININE 1.1 01/26/2014 0904   CREATININE 1.35 01/16/2012 1459      Component Value Date/Time   CALCIUM 9.2 01/26/2014 0904   CALCIUM 9.6 01/16/2012 1459   ALKPHOS 148 01/26/2014 0904   ALKPHOS 138* 01/16/2012 1459   AST 38* 01/26/2014 0904   AST 29 01/16/2012 1459   ALT 40 01/26/2014 0904   ALT 33 01/16/2012 1459   BILITOT 1.70* 01/26/2014 0904   BILITOT 0.7 01/16/2012 1459       Impression and Plan:  A 68 year old  gentleman with the following issues:  1. Thrombocytopenia, differential diagnosis including hypersplenism due to portal hypertension and liver disease.  At this time, he does not really require any intervention.  He has no evidence of any bleeding.  He has adequate platelets.  Continue to monitor him at the time being.  His counts should be adequate for umbilical hernia repair. As close to 50,000. 2.     Iron deficiency anemia.  His hemoglobin appears to be stable at this time and he is status post IV iron 6 months ago. And repeating his iron stores today and every 6 months after that. We will replace as needed. 3.      Followup: Will be in 6 months.    ESLPNP,YYFRT, MD 10/22/20158:56 AM

## 2014-07-29 ENCOUNTER — Encounter: Payer: Self-pay | Admitting: Internal Medicine

## 2014-08-02 ENCOUNTER — Encounter: Payer: Self-pay | Admitting: Internal Medicine

## 2014-08-02 ENCOUNTER — Ambulatory Visit (AMBULATORY_SURGERY_CENTER): Payer: Medicare Other | Admitting: Internal Medicine

## 2014-08-02 VITALS — BP 123/65 | HR 52 | Temp 97.4°F | Resp 16

## 2014-08-02 DIAGNOSIS — D122 Benign neoplasm of ascending colon: Secondary | ICD-10-CM

## 2014-08-02 DIAGNOSIS — D126 Benign neoplasm of colon, unspecified: Secondary | ICD-10-CM

## 2014-08-02 DIAGNOSIS — Z860100 Personal history of colon polyps, unspecified: Secondary | ICD-10-CM

## 2014-08-02 NOTE — Progress Notes (Signed)
Called to room to assist during endoscopic procedure.  Patient ID and intended procedure confirmed with present staff. Received instructions for my participation in the procedure from the performing physician.  

## 2014-08-02 NOTE — Patient Instructions (Signed)

## 2014-08-02 NOTE — Op Note (Signed)
[place]  [address]. [place], [id number]   COLONOSCOPY PROCEDURE REPORT  PATIENT: [name] House, [name] House  MR#: [id number] BIRTHDATE: 1946-08-06 , 51  yrs. old GENDER: male ENDOSCOPIST: Eustace Quail, MD REFERRED TD:[id number] Program Recall PROCEDURE DATE:  08/02/2014 PROCEDURE:   Colonoscopy with snare polypectomy x 1 First Screening Colonoscopy - Avg.  risk and is 50 yrs.  old or older - No.  Prior Negative Screening - Now for repeat screening. N/A  History of Adenoma - Now for follow-up colonoscopy & has been > or = to 3 yrs.  Yes hx of adenoma.  Has been 3 or more years since last colonoscopy.  Polyps Removed Today? Yes. ASA CLASS:   Class III INDICATIONS:surveillance colonoscopy based on a history of adenomatous colonic polyp(s). Index 09-2007 > 10 polyps and TVA. Followups 2694,8546, 2012 MEDICATIONS: Monitored anesthesia care and Propofol 200 mg IV  DESCRIPTION OF PROCEDURE:   After the risks benefits and alternatives of the procedure were thoroughly explained, informed consent was obtained.  The digital rectal exam revealed no abnormalities of the rectum.   The LB [id number]  endoscope was introduced through the anus and advanced to the cecum, which was identified by both the appendix and ileocecal valve. No adverse events experienced.   The quality of the prep was excellent, using MoviPrep  The instrument was then slowly withdrawn as the colon was fully examined.  COLON FINDINGS: A single polyp measuring 8 mm in size was found in the ascending colon.  A polypectomy was performed with a cold snare.  The resection was complete, the polyp tissue was completely retrieved and sent to histology. 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 There was moderate diverticulosis noted in the sigmoid colon.   The examination was otherwise normal.  Retroflexed views revealed internal hemorrhoids. The time to cecum=3  minutes 04 seconds. Withdrawal time=10 minutes 58 seconds.  The scope was withdrawn and the procedure completed. COMPLICATIONS: There were no immediate complications.  ENDOSCOPIC IMPRESSION: 1.   Single polyp measuring 8 mm in size was found in the ascending colon; polypectomy was performed with a cold snare 2.   Moderate diverticulosis was noted in the sigmoid colon 3.   Portal hypertensive colopathy noted.  The examination was otherwise normal  RECOMMENDATIONS: 1. Follow up colonoscopy in 5 years, if medically fit  eSigned:  Eustace Quail, MD 08/02/2014 12:58 PM   cc: Leanna Battles, MD and The Patient

## 2014-08-02 NOTE — Progress Notes (Signed)
Report to PACU, RN, vss, BBS= Clear.  

## 2014-08-02 NOTE — Addendum Note (Signed)
Addended by: Randall Hiss B on: 08/02/2014 05:02 PM   Modules accepted: Medications

## 2014-08-03 ENCOUNTER — Telehealth: Payer: Self-pay | Admitting: *Deleted

## 2014-08-03 NOTE — Telephone Encounter (Signed)
  Follow up Call-  Call back number 08/02/2014 08/30/2013 09/07/2012  Post procedure Call Back phone  # [phone]  Permission to leave phone message Yes Yes Yes    LMOM

## 2014-08-04 ENCOUNTER — Encounter: Payer: Medicare Other | Admitting: Internal Medicine

## 2014-08-08 ENCOUNTER — Encounter: Payer: Self-pay | Admitting: Internal Medicine

## 2014-08-09 ENCOUNTER — Encounter: Payer: Self-pay | Admitting: Internal Medicine

## 2014-09-21 ENCOUNTER — Other Ambulatory Visit: Payer: Self-pay | Admitting: Internal Medicine

## 2014-12-26 ENCOUNTER — Other Ambulatory Visit: Payer: Self-pay | Admitting: Internal Medicine

## 2015-01-02 ENCOUNTER — Telehealth: Payer: Self-pay | Admitting: Internal Medicine

## 2015-01-02 MED ORDER — NADOLOL 20 MG PO TABS: 20.0000 mg | ORAL_TABLET | Freq: Every day | ORAL | Status: DC

## 2015-01-02 NOTE — Telephone Encounter (Signed)
Refilled corgard

## 2015-01-27 ENCOUNTER — Telehealth: Payer: Self-pay | Admitting: Oncology

## 2015-01-27 ENCOUNTER — Other Ambulatory Visit (HOSPITAL_BASED_OUTPATIENT_CLINIC_OR_DEPARTMENT_OTHER): Payer: Medicare Other

## 2015-01-27 ENCOUNTER — Ambulatory Visit (HOSPITAL_BASED_OUTPATIENT_CLINIC_OR_DEPARTMENT_OTHER): Payer: Medicare Other | Admitting: Oncology

## 2015-01-27 VITALS — BP 137/60 | HR 65 | Temp 98.3°F | Resp 18 | Ht 72.0 in | Wt 250.1 lb

## 2015-01-27 DIAGNOSIS — D696 Thrombocytopenia, unspecified: Secondary | ICD-10-CM

## 2015-01-27 DIAGNOSIS — D509 Iron deficiency anemia, unspecified: Secondary | ICD-10-CM | POA: Diagnosis not present

## 2015-01-27 LAB — COMPREHENSIVE METABOLIC PANEL (CC13)
ALT: 43 U/L (ref 0–55)
AST: 43 U/L — ABNORMAL HIGH (ref 5–34)
Albumin: 3.3 g/dL — ABNORMAL LOW (ref 3.5–5.0)
Alkaline Phosphatase: 177 U/L — ABNORMAL HIGH (ref 40–150)
Anion Gap: 8 mEq/L (ref 3–11)
BILIRUBIN TOTAL: 1.77 mg/dL — AB (ref 0.20–1.20)
BUN: 14.2 mg/dL (ref 7.0–26.0)
CALCIUM: 8.2 mg/dL — AB (ref 8.4–10.4)
CO2: 18 mEq/L — ABNORMAL LOW (ref 22–29)
Chloride: 114 mEq/L — ABNORMAL HIGH (ref 98–109)
Creatinine: 0.9 mg/dL (ref 0.7–1.3)
EGFR: 89 mL/min/{1.73_m2} — ABNORMAL LOW (ref >90)
Glucose: 213 mg/dl — ABNORMAL HIGH (ref 70–140)
Potassium: 3.9 mEq/L (ref 3.5–5.1)
Sodium: 140 mEq/L (ref 136–145)
Total Protein: 5.5 g/dL — ABNORMAL LOW (ref 6.4–8.3)

## 2015-01-27 LAB — CBC WITH DIFFERENTIAL/PLATELET
BASO%: 0.4 % (ref 0.0–2.0)
Basophils Absolute: 0 10*3/uL (ref 0.0–0.1)
EOS%: 3.6 % (ref 0.0–7.0)
Eosinophils Absolute: 0.1 10*3/uL (ref 0.0–0.5)
HCT: 35.5 % — ABNORMAL LOW (ref 38.4–49.9)
HEMOGLOBIN: 12 g/dL — AB (ref 13.0–17.1)
LYMPH%: 11.4 % — AB (ref 14.0–49.0)
MCH: 30.1 pg (ref 27.2–33.4)
MCHC: 33.9 g/dL (ref 32.0–36.0)
MCV: 88.8 fL (ref 79.3–98.0)
MONO#: 0.2 10*3/uL (ref 0.1–0.9)
MONO%: 6.6 % (ref 0.0–14.0)
NEUT#: 2.4 10*3/uL (ref 1.5–6.5)
NEUT%: 78 % — ABNORMAL HIGH (ref 39.0–75.0)
PLATELETS: 45 10*3/uL — AB (ref 140–400)
RBC: 3.99 10*6/uL — ABNORMAL LOW (ref 4.20–5.82)
RDW: 13.9 % (ref 11.0–14.6)
WBC: 3.1 10*3/uL — ABNORMAL LOW (ref 4.0–10.3)
lymph#: 0.4 10*3/uL — ABNORMAL LOW (ref 0.9–3.3)

## 2015-01-27 LAB — IRON AND TIBC CHCC
%SAT: 33 % (ref 20–55)
IRON: 93 ug/dL (ref 42–163)
TIBC: 284 ug/dL (ref 202–409)
UIBC: 191 ug/dL (ref 117–376)

## 2015-01-27 LAB — FERRITIN CHCC: FERRITIN: 115 ng/mL (ref 22–316)

## 2015-01-27 NOTE — Telephone Encounter (Signed)
Confirm appointment for 04/29 & 05/06

## 2015-01-27 NOTE — Progress Notes (Signed)
Hematology and Oncology Follow Up Visit  [name] House [id number] 1946/02/08 69 y.o. 01/27/2015 9:11 AM  Principle Diagnosis: This is a 69 year old gentleman with the following diagnoses:  1. Iron deficiency anemia. This is due to a history of gastric bypass operation.  He has been receiving intermittent iron replacement, most recently of which was in 01/2014. 2.    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   Interim History: [name] House presents today for a followup visit. Since last visit, he reports slightly increased fatigue more than usual. He has continued to be in excellent health and shape.  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 He has reported decrease in his exercise tolerance indicating possible need for IV iron infusion. He did have a I surgery with slow recovery at this time.  He has not reported any any abdominal pain, had not reported any diarrhea, had not reported any epistaxis or hematochezia.  He does report some occasional abdominal distention and gas, but this is again not associated with any nausea, not associated with any vomiting. He has not reported any headaches blurred vision double vision or other mental status. Has not reported any chest pain shortness of breath or cough or hemoptysis. He has not reported any petechiae or or lymphadenopathy. Has not reported any frequency urgency or hematuria. Rest of his review of systems unremarkable.   Medications: I have reviewed the patient's current medications. Current Outpatient Prescriptions  Medication Sig Dispense Refill  . Cholecalciferol (VITAMIN D3) 2000 UNITS TABS Take 1 capsule by mouth daily.      . diphenoxylate-atropine (LOMOTIL) 2.5-0.025 MG per tablet Take 1 tablet by mouth daily.     Marland Kitchen lisinopril (PRINIVIL,ZESTRIL) 20 MG tablet Take 20 mg by mouth daily.    . Multiple Vitamins-Minerals (MULTIVITAMINS  THER. W/MINERALS) TABS Take 1 tablet by mouth daily.      . nadolol (CORGARD) 20 MG tablet Take 1 tablet (20 mg total) by mouth daily. 90 tablet 0   No current facility-administered medications for this visit.     Allergies:  Allergies  Allergen Reactions  . Oxycodone Other (See Comments)    Caused him to fall..really sedated    Past Medical History, Surgical history, Social history, and Family History were reviewed and updated.   Physical Exam:  Blood pressure 137/60, pulse 65, temperature 98.3 F (36.8 C), temperature source Oral, resp. rate 18, height 6' (1.829 m), weight 250 lb 1.6 oz (113.445 kg), SpO2 99 %. ECOG: 0 General appearance: alert awake appeared in no active distress Head: Normocephalic, without obvious abnormality, atraumatic Neck: no adenopathy Lymph nodes: Cervical, supraclavicular, and axillary nodes normal. Heart:regular rate and rhythm, S1, S2 normal, no murmur, click, rub or gallop Lung:chest clear, no wheezing, rales, normal symmetric air entry Abdomin: soft, non-tender, without masses or organomegaly. EXT:no erythema, induration, or nodules   Lab Results: Lab Results  Component Value Date   WBC 3.1* 01/27/2015   HGB 12.0* 01/27/2015   HCT 35.5* 01/27/2015   MCV 88.8 01/27/2015   PLT 45* 01/27/2015     Chemistry      Component Value Date/Time   NA 141 07/28/2014 0832   NA 138 01/16/2012 1459   K 4.2 07/28/2014 0832   K 4.6 01/16/2012 1459   CL 112* 01/13/2013 0840   CL 109 01/16/2012 1459   CO2 23 07/28/2014 0832   CO2 21 01/16/2012 1459   BUN 13.5  07/28/2014 0832   BUN 24* 01/16/2012 1459   CREATININE 1.1 07/28/2014 0832   CREATININE 1.35 01/16/2012 1459      Component Value Date/Time   CALCIUM 9.0 07/28/2014 0832   CALCIUM 9.6 01/16/2012 1459   ALKPHOS 143 07/28/2014 0832   ALKPHOS 138* 01/16/2012 1459   AST 36* 07/28/2014 0832   AST 29 01/16/2012 1459   ALT 43 07/28/2014 0832   ALT 33 01/16/2012 1459   BILITOT 1.88*  07/28/2014 0832   BILITOT 0.7 01/16/2012 1459       Impression and Plan:  A 69 year old gentleman with the following issues:  1. Thrombocytopenia, differential diagnosis including hypersplenism due to portal hypertension and liver disease. No active bleeding noted at this time. His platelet count remains stable a plan to continue observation and surveillance. 2.     Iron deficiency anemia. He appears to be more symptomatic at this time. I am repeating his iron studies plan to give him IV iron in the near future if his iron studies do indicate iron deficiency. 3.      Followup: Will be in 6 months.    [facility], MD 4/22/20169:11 AM

## 2015-01-27 NOTE — Telephone Encounter (Signed)
Gave avs & calendar for October. Sent message to schedule IV iron.

## 2015-02-03 ENCOUNTER — Ambulatory Visit (HOSPITAL_BASED_OUTPATIENT_CLINIC_OR_DEPARTMENT_OTHER): Payer: Medicare Other

## 2015-02-03 VITALS — BP 127/55 | HR 61 | Temp 97.4°F | Resp 20

## 2015-02-03 DIAGNOSIS — D509 Iron deficiency anemia, unspecified: Secondary | ICD-10-CM | POA: Diagnosis not present

## 2015-02-03 MED ORDER — SODIUM CHLORIDE 0.9 % IV SOLN
Freq: Once | INTRAVENOUS | Status: AC
  Administered 2015-02-03: 08:00:00 via INTRAVENOUS

## 2015-02-03 MED ORDER — SODIUM CHLORIDE 0.9 % IV SOLN
510.0000 mg | Freq: Once | INTRAVENOUS | Status: AC
  Administered 2015-02-03: 510 mg via INTRAVENOUS
  Filled 2015-02-03: qty 17

## 2015-02-03 NOTE — Patient Instructions (Signed)

## 2015-02-10 ENCOUNTER — Ambulatory Visit (HOSPITAL_BASED_OUTPATIENT_CLINIC_OR_DEPARTMENT_OTHER): Payer: Medicare Other

## 2015-02-10 VITALS — BP 137/57 | HR 65 | Temp 97.0°F | Resp 18

## 2015-02-10 DIAGNOSIS — D509 Iron deficiency anemia, unspecified: Secondary | ICD-10-CM | POA: Diagnosis not present

## 2015-02-10 MED ORDER — SODIUM CHLORIDE 0.9 % IV SOLN
Freq: Once | INTRAVENOUS | Status: AC
  Administered 2015-02-10: 11:00:00 via INTRAVENOUS

## 2015-02-10 MED ORDER — SODIUM CHLORIDE 0.9 % IV SOLN
510.0000 mg | Freq: Once | INTRAVENOUS | Status: AC
  Administered 2015-02-10: 510 mg via INTRAVENOUS
  Filled 2015-02-10: qty 17

## 2015-02-10 NOTE — Patient Instructions (Signed)

## 2015-02-13 ENCOUNTER — Inpatient Hospital Stay (HOSPITAL_COMMUNITY)
Admission: EM | Admit: 2015-02-13 | Discharge: 2015-02-15 | DRG: 690 | Disposition: A | Discharge status: Home / Self Care | Payer: Medicare Other | Attending: Internal Medicine | Admitting: Internal Medicine

## 2015-02-13 DIAGNOSIS — E785 Hyperlipidemia, unspecified: Secondary | ICD-10-CM | POA: Diagnosis present

## 2015-02-13 DIAGNOSIS — K7581 Nonalcoholic steatohepatitis (NASH): Secondary | ICD-10-CM | POA: Diagnosis present

## 2015-02-13 DIAGNOSIS — Z9884 Bariatric surgery status: Secondary | ICD-10-CM

## 2015-02-13 DIAGNOSIS — R531 Weakness: Secondary | ICD-10-CM

## 2015-02-13 DIAGNOSIS — Z8249 Family history of ischemic heart disease and other diseases of the circulatory system: Secondary | ICD-10-CM

## 2015-02-13 DIAGNOSIS — E860 Dehydration: Secondary | ICD-10-CM | POA: Diagnosis present

## 2015-02-13 DIAGNOSIS — Z96641 Presence of right artificial hip joint: Secondary | ICD-10-CM | POA: Diagnosis present

## 2015-02-13 DIAGNOSIS — D696 Thrombocytopenia, unspecified: Secondary | ICD-10-CM | POA: Diagnosis present

## 2015-02-13 DIAGNOSIS — N390 Urinary tract infection, site not specified: Principal | ICD-10-CM | POA: Diagnosis present

## 2015-02-13 DIAGNOSIS — Z833 Family history of diabetes mellitus: Secondary | ICD-10-CM

## 2015-02-13 DIAGNOSIS — R509 Fever, unspecified: Secondary | ICD-10-CM

## 2015-02-13 DIAGNOSIS — E872 Acidosis: Secondary | ICD-10-CM | POA: Diagnosis present

## 2015-02-13 DIAGNOSIS — I10 Essential (primary) hypertension: Secondary | ICD-10-CM | POA: Diagnosis present

## 2015-02-13 DIAGNOSIS — R338 Other retention of urine: Secondary | ICD-10-CM | POA: Diagnosis present

## 2015-02-13 DIAGNOSIS — R39198 Other difficulties with micturition: Secondary | ICD-10-CM

## 2015-02-13 DIAGNOSIS — Z87442 Personal history of urinary calculi: Secondary | ICD-10-CM

## 2015-02-13 DIAGNOSIS — E119 Type 2 diabetes mellitus without complications: Secondary | ICD-10-CM

## 2015-02-13 DIAGNOSIS — D731 Hypersplenism: Secondary | ICD-10-CM | POA: Diagnosis present

## 2015-02-13 DIAGNOSIS — N401 Enlarged prostate with lower urinary tract symptoms: Secondary | ICD-10-CM | POA: Diagnosis present

## 2015-02-13 DIAGNOSIS — D509 Iron deficiency anemia, unspecified: Secondary | ICD-10-CM | POA: Diagnosis present

## 2015-02-13 DIAGNOSIS — Z79899 Other long term (current) drug therapy: Secondary | ICD-10-CM

## 2015-02-13 DIAGNOSIS — D6959 Other secondary thrombocytopenia: Secondary | ICD-10-CM | POA: Diagnosis present

## 2015-02-13 NOTE — ED Notes (Signed)
Bed: WA17 Expected date:  Expected time:  Means of arrival:  Comments: ems 

## 2015-02-13 NOTE — ED Notes (Signed)
Per EMS, patient states he is unable to void. Patient got into bathtub at @2215  was unable to get himself out of bathtub, previously was able to do so today. Patient states he is having difficulty emptying his bladder, last time he was able to void completely was on Saturday, has only been able to empty small amounts. . Patient reports a fever at home 101.5 oral. Patient states he had an iron transfusion on Friday and was feeling well. Patient with decreased SPO2 with EMS, placed on 2LNC, 95% on 2LNC.

## 2015-02-14 ENCOUNTER — Encounter (HOSPITAL_COMMUNITY): Payer: Self-pay | Admitting: Emergency Medicine

## 2015-02-14 ENCOUNTER — Inpatient Hospital Stay (HOSPITAL_COMMUNITY): Payer: Medicare Other

## 2015-02-14 DIAGNOSIS — Z79899 Other long term (current) drug therapy: Secondary | ICD-10-CM | POA: Diagnosis not present

## 2015-02-14 DIAGNOSIS — R509 Fever, unspecified: Secondary | ICD-10-CM | POA: Insufficient documentation

## 2015-02-14 DIAGNOSIS — Z87442 Personal history of urinary calculi: Secondary | ICD-10-CM | POA: Diagnosis not present

## 2015-02-14 DIAGNOSIS — Z8249 Family history of ischemic heart disease and other diseases of the circulatory system: Secondary | ICD-10-CM | POA: Diagnosis not present

## 2015-02-14 DIAGNOSIS — Z96641 Presence of right artificial hip joint: Secondary | ICD-10-CM | POA: Diagnosis present

## 2015-02-14 DIAGNOSIS — I10 Essential (primary) hypertension: Secondary | ICD-10-CM

## 2015-02-14 DIAGNOSIS — E872 Acidosis: Secondary | ICD-10-CM | POA: Diagnosis present

## 2015-02-14 DIAGNOSIS — E785 Hyperlipidemia, unspecified: Secondary | ICD-10-CM | POA: Diagnosis present

## 2015-02-14 DIAGNOSIS — R531 Weakness: Secondary | ICD-10-CM | POA: Diagnosis not present

## 2015-02-14 DIAGNOSIS — R739 Hyperglycemia, unspecified: Secondary | ICD-10-CM | POA: Insufficient documentation

## 2015-02-14 DIAGNOSIS — E860 Dehydration: Secondary | ICD-10-CM | POA: Diagnosis present

## 2015-02-14 DIAGNOSIS — D6959 Other secondary thrombocytopenia: Secondary | ICD-10-CM | POA: Diagnosis present

## 2015-02-14 DIAGNOSIS — N390 Urinary tract infection, site not specified: Secondary | ICD-10-CM | POA: Diagnosis present

## 2015-02-14 DIAGNOSIS — Z833 Family history of diabetes mellitus: Secondary | ICD-10-CM | POA: Diagnosis not present

## 2015-02-14 DIAGNOSIS — K7581 Nonalcoholic steatohepatitis (NASH): Secondary | ICD-10-CM | POA: Diagnosis present

## 2015-02-14 DIAGNOSIS — N401 Enlarged prostate with lower urinary tract symptoms: Secondary | ICD-10-CM | POA: Diagnosis present

## 2015-02-14 DIAGNOSIS — D509 Iron deficiency anemia, unspecified: Secondary | ICD-10-CM | POA: Diagnosis present

## 2015-02-14 DIAGNOSIS — E119 Type 2 diabetes mellitus without complications: Secondary | ICD-10-CM

## 2015-02-14 DIAGNOSIS — D731 Hypersplenism: Secondary | ICD-10-CM | POA: Diagnosis present

## 2015-02-14 DIAGNOSIS — R338 Other retention of urine: Secondary | ICD-10-CM | POA: Diagnosis present

## 2015-02-14 DIAGNOSIS — D696 Thrombocytopenia, unspecified: Secondary | ICD-10-CM | POA: Diagnosis present

## 2015-02-14 DIAGNOSIS — Z9884 Bariatric surgery status: Secondary | ICD-10-CM | POA: Diagnosis not present

## 2015-02-14 LAB — CBC WITH DIFFERENTIAL/PLATELET
BASOS PCT: 0 % (ref 0–1)
Basophils Absolute: 0 10*3/uL (ref 0.0–0.1)
Basophils Absolute: 0 10*3/uL (ref 0.0–0.1)
Basophils Relative: 0 % (ref 0–1)
EOS ABS: 0.1 10*3/uL (ref 0.0–0.7)
Eosinophils Absolute: 0.1 10*3/uL (ref 0.0–0.7)
Eosinophils Relative: 1 % (ref 0–5)
Eosinophils Relative: 1 % (ref 0–5)
HEMATOCRIT: 34.5 % — AB (ref 39.0–52.0)
HEMATOCRIT: 35.7 % — AB (ref 39.0–52.0)
HEMOGLOBIN: 12 g/dL — AB (ref 13.0–17.0)
Hemoglobin: 12.5 g/dL — ABNORMAL LOW (ref 13.0–17.0)
LYMPHS PCT: 7 % — AB (ref 12–46)
Lymphocytes Relative: 7 % — ABNORMAL LOW (ref 12–46)
Lymphs Abs: 0.4 10*3/uL — ABNORMAL LOW (ref 0.7–4.0)
Lymphs Abs: 0.3 10*3/uL — ABNORMAL LOW (ref 0.7–4.0)
MCH: 30.9 pg (ref 26.0–34.0)
MCH: 31 pg (ref 26.0–34.0)
MCHC: 34.8 g/dL (ref 30.0–36.0)
MCHC: 35 g/dL (ref 30.0–36.0)
MCV: 88.6 fL (ref 78.0–100.0)
MCV: 88.9 fL (ref 78.0–100.0)
MONO ABS: 0.5 10*3/uL (ref 0.1–1.0)
MONOS PCT: 10 % (ref 3–12)
Monocytes Absolute: 0.5 10*3/uL (ref 0.1–1.0)
Monocytes Relative: 8 % (ref 3–12)
NEUTROS ABS: 4.2 10*3/uL (ref 1.7–7.7)
NEUTROS ABS: 5 10*3/uL (ref 1.7–7.7)
NEUTROS PCT: 84 % — AB (ref 43–77)
Neutrophils Relative %: 83 % — ABNORMAL HIGH (ref 43–77)
Platelets: 53 10*3/uL — ABNORMAL LOW (ref 150–400)
Platelets: 48 10*3/uL — ABNORMAL LOW (ref 150–400)
RBC: 3.88 MIL/uL — ABNORMAL LOW (ref 4.22–5.81)
RBC: 4.03 MIL/uL — AB (ref 4.22–5.81)
RDW: 13.8 % (ref 11.5–15.5)
RDW: 14 % (ref 11.5–15.5)
WBC: 6 10*3/uL (ref 4.0–10.5)
WBC: 5.1 10*3/uL (ref 4.0–10.5)

## 2015-02-14 LAB — I-STAT CG4 LACTIC ACID, ED: Lactic Acid, Venous: 1.71 mmol/L (ref 0.5–2.0)

## 2015-02-14 LAB — URINALYSIS, ROUTINE W REFLEX MICROSCOPIC
Bilirubin Urine: NEGATIVE
Glucose, UA: NEGATIVE mg/dL
Ketones, ur: 15 mg/dL — AB
NITRITE: POSITIVE — AB
Protein, ur: 100 mg/dL — AB
SPECIFIC GRAVITY, URINE: 1.012 (ref 1.005–1.030)
Urobilinogen, UA: 1 mg/dL (ref 0.0–1.0)
pH: 5.5 (ref 5.0–8.0)

## 2015-02-14 LAB — COMPREHENSIVE METABOLIC PANEL
ALK PHOS: 132 U/L — AB (ref 38–126)
ALT: 26 U/L (ref 17–63)
AST: 33 U/L (ref 15–41)
Albumin: 3.4 g/dL — ABNORMAL LOW (ref 3.5–5.0)
Anion gap: 5 (ref 5–15)
BUN: 15 mg/dL (ref 6–20)
CO2: 18 mmol/L — AB (ref 22–32)
Calcium: 8.4 mg/dL — ABNORMAL LOW (ref 8.9–10.3)
Chloride: 111 mmol/L (ref 101–111)
Creatinine, Ser: 0.91 mg/dL (ref 0.61–1.24)
GFR calc Af Amer: >60 mL/min (ref >60)
GFR calc non Af Amer: >60 mL/min (ref >60)
GLUCOSE: 210 mg/dL — AB (ref 70–99)
Potassium: 3.7 mmol/L (ref 3.5–5.1)
Sodium: 134 mmol/L — ABNORMAL LOW (ref 135–145)
TOTAL PROTEIN: 5.8 g/dL — AB (ref 6.5–8.1)
Total Bilirubin: 2.5 mg/dL — ABNORMAL HIGH (ref 0.3–1.2)

## 2015-02-14 LAB — CK: Total CK: 162 U/L (ref 49–397)

## 2015-02-14 LAB — TROPONIN I

## 2015-02-14 LAB — GLUCOSE, CAPILLARY
GLUCOSE-CAPILLARY: 188 mg/dL — AB (ref 70–99)
Glucose-Capillary: 194 mg/dL — ABNORMAL HIGH (ref 70–99)
Glucose-Capillary: 159 mg/dL — ABNORMAL HIGH (ref 70–99)
Glucose-Capillary: 171 mg/dL — ABNORMAL HIGH (ref 70–99)

## 2015-02-14 LAB — BASIC METABOLIC PANEL
ANION GAP: 6 (ref 5–15)
BUN: 14 mg/dL (ref 6–20)
CALCIUM: 8.6 mg/dL — AB (ref 8.9–10.3)
CO2: 23 mmol/L (ref 22–32)
CREATININE: 0.97 mg/dL (ref 0.61–1.24)
Chloride: 110 mmol/L (ref 101–111)
GFR calc Af Amer: >60 mL/min (ref >60)
Glucose, Bld: 201 mg/dL — ABNORMAL HIGH (ref 70–99)
Potassium: 3.8 mmol/L (ref 3.5–5.1)
SODIUM: 139 mmol/L (ref 135–145)

## 2015-02-14 LAB — URINE MICROSCOPIC-ADD ON

## 2015-02-14 LAB — TSH: TSH: 1.146 u[IU]/mL (ref 0.350–4.500)

## 2015-02-14 MED ORDER — ACETAMINOPHEN 650 MG RE SUPP: 650.0000 mg | Freq: Four times a day (QID) | RECTAL | Status: DC | PRN

## 2015-02-14 MED ORDER — SODIUM CHLORIDE 0.9 % IV SOLN
INTRAVENOUS | Status: AC
  Administered 2015-02-14: 06:00:00 via INTRAVENOUS

## 2015-02-14 MED ORDER — CEFTRIAXONE SODIUM 1 G IJ SOLR
1.0000 g | Freq: Once | INTRAMUSCULAR | Status: AC
  Administered 2015-02-14: 1 g via INTRAVENOUS
  Filled 2015-02-14: qty 10

## 2015-02-14 MED ORDER — ACETAMINOPHEN 325 MG PO TABS: 650.0000 mg | ORAL_TABLET | Freq: Four times a day (QID) | ORAL | Status: DC | PRN

## 2015-02-14 MED ORDER — CEFTRIAXONE SODIUM IN DEXTROSE 20 MG/ML IV SOLN
1.0000 g | Freq: Every day | INTRAVENOUS | Status: DC
  Administered 2015-02-14: 1 g via INTRAVENOUS
  Filled 2015-02-14: qty 50

## 2015-02-14 MED ORDER — ONDANSETRON HCL 4 MG/2ML IJ SOLN: 4.0000 mg | Freq: Four times a day (QID) | INTRAMUSCULAR | Status: DC | PRN

## 2015-02-14 MED ORDER — INSULIN ASPART 100 UNIT/ML ~~LOC~~ SOLN
0.0000 [IU] | Freq: Three times a day (TID) | SUBCUTANEOUS | Status: DC
  Administered 2015-02-14 – 2015-02-15 (×4): 2 [IU] via SUBCUTANEOUS

## 2015-02-14 MED ORDER — ONDANSETRON HCL 4 MG PO TABS: 4.0000 mg | ORAL_TABLET | Freq: Four times a day (QID) | ORAL | Status: DC | PRN

## 2015-02-14 MED ORDER — NADOLOL 20 MG PO TABS
20.0000 mg | ORAL_TABLET | Freq: Every day | ORAL | Status: DC
  Administered 2015-02-14 – 2015-02-15 (×2): 20 mg via ORAL
  Filled 2015-02-14 (×2): qty 1

## 2015-02-14 MED ORDER — LISINOPRIL 20 MG PO TABS
20.0000 mg | ORAL_TABLET | Freq: Every day | ORAL | Status: DC
  Administered 2015-02-14 – 2015-02-15 (×2): 20 mg via ORAL
  Filled 2015-02-14 (×2): qty 1

## 2015-02-14 MED ORDER — SODIUM CHLORIDE 0.9 % IV SOLN
Freq: Once | INTRAVENOUS | Status: AC
  Administered 2015-02-14: 03:00:00 via INTRAVENOUS

## 2015-02-14 NOTE — ED Provider Notes (Signed)
CSN: [id number]     Arrival date & time 02/13/15  2337 History   First MD Initiated Contact with Patient 02/14/15 0013     Chief Complaint  Patient presents with  . Fever  . generalized weakness     new onset tonight  . unable to empty bladder      (Consider location/radiation/quality/duration/timing/severity/associated sxs/prior Treatment) HPI 69 year old male presents to the emergency department from home via EMS with complaint of fever, generalized weakness, and dysuria.  Patient reports starting Saturday evening he began only be able to urinate small amounts.  Vision has history of kidney stones, and reports feeling is similar to prior kidney stones.  He reports that he is only able to urinate small amounts, but feels distended and full.  He is taken multiple baths today as it makes him feel better.  When he was taking his third baths the day, patient was unable to get himself up out of the tub due to weakness.  He was noted to have a fever of 101.5.  No cough, wheeze, shortness of breath, nausea, vomiting, diarrhea, no rash, no neck stiffness or headache. Past Medical History  Diagnosis Date  . Diabetes mellitus, type 2     resolved with gastric bypass  . Hyperlipemia   . Kidney stones   . Osteoarthritis of hip   . Hypertension   . Benign neoplasm of colon   . Diverticulosis of colon (without mention of hemorrhage)   . Iron deficiency anemia secondary to blood loss (chronic)   . Unspecified sleep apnea     Resolved  . Cirrhosis   . Liver cyst   . Renal cyst   . Esophageal varices   . Abdominal hernia    Past Surgical History  Procedure Laterality Date  . Knee surgery      Bil  . Ankle surgery      Bil  . Gastric bypass  2005  . Total hip arthroplasty  2008    right  . Colonoscopy  2012  . Polypectomy     Family History  Problem Relation Age of Onset  . Heart attack Father     deceased due to MI  . Heart disease Father   . Diabetes Mother     deceased due to  complications of DM  . Coronary artery disease Brother   . Colon cancer Neg Hx   . Stomach cancer Neg Hx    History  Substance Use Topics  . Smoking status: Never Smoker   . Smokeless tobacco: Never Used  . Alcohol Use: 0.0 oz/week    2-3 Glasses of wine per week     Comment: week    Review of Systems   See History of Present Illness; otherwise all other systems are reviewed and negative  Allergies  Oxycodone  Home Medications   Prior to Admission medications   Medication Sig Start Date End Date Taking? Authorizing Provider  Cholecalciferol (VITAMIN D3) 2000 UNITS TABS Take 1 capsule by mouth daily.     Yes Historical Provider, MD  diphenoxylate-atropine (LOMOTIL) 2.5-0.025 MG per tablet Take 1 tablet by mouth as directed.    Yes Historical Provider, MD  lisinopril (PRINIVIL,ZESTRIL) 20 MG tablet Take 20 mg by mouth daily.   Yes Historical Provider, MD  nadolol (CORGARD) 20 MG tablet Take 1 tablet (20 mg total) by mouth daily. 01/02/15  Yes Irene Shipper, MD  Multiple Vitamins-Minerals (MULTIVITAMINS THER. W/MINERALS) TABS Take 1 tablet by mouth daily.  Historical Provider, MD   BP 143/98 mmHg  Pulse 83  Temp(Src) 100.8 F (38.2 C)  Resp 19  SpO2 95% Physical Exam  Constitutional: He is oriented to person, place, and time. He appears well-developed and well-nourished.  HENT:  Head: Normocephalic and atraumatic.  Nose: Nose normal.  Mouth/Throat: Oropharynx is clear and moist.  Eyes: Conjunctivae and EOM are normal. Pupils are equal, round, and reactive to light.  Neck: Normal range of motion. Neck supple. No JVD present. No tracheal deviation present. No thyromegaly present.  Cardiovascular: Normal rate, regular rhythm, normal heart sounds and intact distal pulses.  Exam reveals no gallop and no friction rub.   No murmur heard. Pulmonary/Chest: Effort normal and breath sounds normal. No stridor. No respiratory distress. He has no wheezes. He has no rales. He exhibits  no tenderness.  Abdominal: Soft. Bowel sounds are normal. He exhibits no distension and no mass. There is no tenderness. There is no rebound and no guarding.  Musculoskeletal: Normal range of motion. He exhibits no edema or tenderness.  Lymphadenopathy:    He has no cervical adenopathy.  Neurological: He is alert and oriented to person, place, and time. He displays normal reflexes. He exhibits normal muscle tone. Coordination normal.  Skin: Skin is warm and dry. No rash noted. No erythema. No pallor.  Psychiatric: He has a normal mood and affect. His behavior is normal. Judgment and thought content normal.  Nursing note and vitals reviewed.   ED Course  Procedures (including critical care time) Labs Review Labs Reviewed  COMPREHENSIVE METABOLIC PANEL - Abnormal; Notable for the following:    Sodium 134 (*)    CO2 18 (*)    Glucose, Bld 210 (*)    Calcium 8.4 (*)    Total Protein 5.8 (*)    Albumin 3.4 (*)    Alkaline Phosphatase 132 (*)    Total Bilirubin 2.5 (*)    All other components within normal limits  CBC WITH DIFFERENTIAL/PLATELET - Abnormal; Notable for the following:    RBC 4.03 (*)    Hemoglobin 12.5 (*)    HCT 35.7 (*)    Platelets 53 (*)    Neutrophils Relative % 84 (*)    Lymphocytes Relative 7 (*)    Lymphs Abs 0.4 (*)    All other components within normal limits  URINALYSIS, ROUTINE W REFLEX MICROSCOPIC - Abnormal; Notable for the following:    APPearance TURBID (*)    Hgb urine dipstick LARGE (*)    Ketones, ur 15 (*)    Protein, ur 100 (*)    Nitrite POSITIVE (*)    Leukocytes, UA LARGE (*)    All other components within normal limits  URINE MICROSCOPIC-ADD ON - Abnormal; Notable for the following:    Bacteria, UA FEW (*)    All other components within normal limits  URINE CULTURE  CULTURE, BLOOD (ROUTINE X 2)  CULTURE, BLOOD (ROUTINE X 2)  I-STAT CG4 LACTIC ACID, ED    Imaging Review No results found.   EKG Interpretation None      MDM    Final diagnoses:  Urinary tract infection without hematuria, site unspecified  Fever and chills  Weakness   69 year old male with onset of dysuria 2 days ago, now with fever, generalized weakness.  Patient has significant urinary tract infection.  Lactic acid normal.  Patient not hypertensive.  Will start on Rocephin for UTI and discuss with hospitalist for admission.    Linton Flemings, MD 02/14/15 [phone]

## 2015-02-14 NOTE — Progress Notes (Signed)
Patient has been seen and evaluated earlier the same by my associate. Please refer to H&P for details regarding assessment and plan.  Physical exam Gen.: Patient in no acute distress, alert and awake Cardiovascular: No cyanosis Pulmonary: No increased work of breathing, equal chest rise, no audible wheezes  Renal ultrasound reports no hydronephrosis or acute renal findings. Patient is currently on Rocephin  Our team will reevaluate next a.m. unless there is an acute medical condition requiring reevaluation.  Jaimi Belle, [employer]

## 2015-02-14 NOTE — ED Notes (Signed)
MD at bedside. 

## 2015-02-14 NOTE — ED Notes (Signed)
Family at bedside. 

## 2015-02-14 NOTE — Progress Notes (Signed)
ANTIBIOTIC CONSULT NOTE - INITIAL  Pharmacy Consult for Rocephin Indication: UTI  Allergies  Allergen Reactions  . Oxycodone Other (See Comments)    Caused him to fall..really sedated    Patient Measurements:   Wt=113 kg  Vital Signs: Temp: 99.4 F (37.4 C) (05/10 0345) Temp Source: Oral (05/10 0345) BP: 145/67 mmHg (05/10 0345) Pulse Rate: 72 (05/10 0345) Intake/Output from previous day:   Intake/Output from this shift:    Labs:  Recent Labs  02/14/15 0029  WBC 6.0  HGB 12.5*  PLT 53*  CREATININE 0.91   CrCl cannot be calculated (Unknown ideal weight.). No results for input(s): VANCOTROUGH, VANCOPEAK, VANCORANDOM, GENTTROUGH, GENTPEAK, GENTRANDOM, TOBRATROUGH, TOBRAPEAK, TOBRARND, AMIKACINPEAK, AMIKACINTROU, AMIKACIN in the last 72 hours.   Microbiology: No results found for this or any previous visit (from the past 720 hour(s)).  Medical History: Past Medical History  Diagnosis Date  . Diabetes mellitus, type 2     resolved with gastric bypass  . Hyperlipemia   . Kidney stones   . Osteoarthritis of hip   . Hypertension   . Benign neoplasm of colon   . Diverticulosis of colon (without mention of hemorrhage)   . Iron deficiency anemia secondary to blood loss (chronic)   . Unspecified sleep apnea     Resolved  . Cirrhosis   . Liver cyst   . Renal cyst   . Esophageal varices   . Abdominal hernia     Medications:  Prescriptions prior to admission  Medication Sig Dispense Refill Last Dose  . Cholecalciferol (VITAMIN D3) 2000 UNITS TABS Take 1 capsule by mouth daily.     more than week  . diphenoxylate-atropine (LOMOTIL) 2.5-0.025 MG per tablet Take 1 tablet by mouth as directed.    02/13/2015 at Unknown time  . lisinopril (PRINIVIL,ZESTRIL) 20 MG tablet Take 20 mg by mouth daily.   02/13/2015 at Unknown time  . nadolol (CORGARD) 20 MG tablet Take 1 tablet (20 mg total) by mouth daily. 90 tablet 0 02/13/2015 at 0800  . Multiple Vitamins-Minerals  (MULTIVITAMINS THER. W/MINERALS) TABS Take 1 tablet by mouth daily.     moe than week   Scheduled:  . cefTRIAXone (ROCEPHIN)  IV  1 g Intravenous QHS  . insulin aspart  0-9 Units Subcutaneous TID WC  . lisinopril  20 mg Oral Daily  . nadolol  20 mg Oral Daily   Infusions:  . sodium chloride     Assessment: 69 yoM fever, chills, weakness and difficulty urinating.  Rocephin per Rx for UTI.   Goal of Therapy:  Treat UTI  Plan:   Rocephin 1Gm IV q24h  F/u cultures  Lawana Pai R 02/14/2015,5:01 AM

## 2015-02-14 NOTE — Care Management Note (Signed)
Case Management Note  Patient Details  Name: [name] House MRN: [id number] Date of Birth: 1946/03/21  Subjective/Objective:                  UTI Action/Plan: Discharge planning  Expected Discharge Date:                  Expected Discharge Plan:  Home/Self Care  In-House Referral:     Discharge planning Services  CM Consult  Post Acute Care Choice:    Choice offered to:     DME Arranged:    DME Agency:     HH Arranged:    HH Agency:     Status of Service:  In process, will continue to follow  Medicare Important Message Given:    Date Medicare IM Given:    Medicare IM give by:    Date Additional Medicare IM Given:    Additional Medicare Important Message give by:     If discussed at [place] of Stay Meetings, dates discussed:    Additional Comments: Utilization Review Completed.  Pt is independent with all ADLs.  CM will follow for disposition needs. Dellie Catholic, RN 02/14/2015, 11:35 AM

## 2015-02-14 NOTE — Evaluation (Signed)
Physical Therapy Evaluation Patient Details Name: [name] House MRN: [id number] DOB: 29-Jan-1946 Today's Date: 02/14/2015   History of Present Illness  [name] House is a 69 y.o. male adm 02/13/15 with s/o profound weakness, UTI; PMHx: cirrhosis of liver secondary to [name], chronic iron deficiency anemia, chronic thrombocytopenia secondary to hypersplenism, diabetes mellitus, hypertension   Clinical Impression  Will follow to address deficits below, progress to amb without RW as this is his baseline;     Follow Up Recommendations No PT follow up    Equipment Recommendations  None recommended by PT    Recommendations for Other Services       Precautions / Restrictions Precautions Precautions: Fall      Mobility  Bed Mobility Overal bed mobility: Needs Assistance Bed Mobility: Supine to Sit     Supine to sit: Supervision     General bed mobility comments: for safety  Transfers Overall transfer level: Needs assistance Equipment used: Rolling walker (2 wheeled) Transfers: Sit to/from Stand Sit to Stand: Min guard         General transfer comment: cues for hand placement safety  Ambulation/Gait Ambulation/Gait assistance: Min guard Ambulation Distance (Feet): 110 Feet (10') Assistive device: Rolling walker (2 wheeled) Gait Pattern/deviations: Step-to pattern;Step-through pattern     General Gait Details: for safety  Stairs            Wheelchair Mobility    Modified Rankin (Stroke Patients Only)       Balance                                             Pertinent Vitals/Pain Pain Assessment: No/denies pain    Home Living Family/patient expects to be discharged to:: Private residence Living Arrangements: Spouse/significant other Available Help at Discharge: Available PRN/intermittently Type of Home: House Home Access: Stairs to enter   Technical brewer of Steps: 1 Home Layout: One level Home Equipment: Bedside  commode;Cane - single point Additional Comments: unsure if SW or RW    Prior Function Level of Independence: Independent               Hand Dominance        Extremity/Trunk Assessment   Upper Extremity Assessment: Overall WFL for tasks assessed           Lower Extremity Assessment: Overall WFL for tasks assessed         Communication   Communication: No difficulties  Cognition Arousal/Alertness: Awake/alert Behavior During Therapy: WFL for tasks assessed/performed Overall Cognitive Status: Within Functional Limits for tasks assessed                      General Comments      Exercises        Assessment/Plan    PT Assessment Patient needs continued PT services  PT Diagnosis Difficulty walking   PT Problem List Decreased activity tolerance;Decreased mobility  PT Treatment Interventions DME instruction;Gait training;Functional mobility training;Therapeutic activities;Patient/family education;Therapeutic exercise   PT Goals (Current goals can be found in the Care Plan section) Acute Rehab PT Goals Patient Stated Goal: get stronger PT Goal Formulation: With patient Time For Goal Achievement: 02/21/15 Potential to Achieve Goals: Good    Frequency Min 2X/week   Barriers to discharge        Co-evaluation  End of Session   Activity Tolerance: Patient tolerated treatment well Patient left: with call bell/phone within reach;in chair;with family/visitor present Nurse Communication: Mobility status         Time: 1021-1173 PT Time Calculation (min) (ACUTE ONLY): 29 min   Charges:   PT Evaluation $Initial PT Evaluation Tier I: 1 Procedure PT Treatments $Gait Training: 8-22 mins   PT G Codes:        Trestin Vences 02-23-15, 1:49 PM

## 2015-02-14 NOTE — H&P (Addendum)
[facility] History and Physical  [name] House XTG:[id number] DOB: 04/03/1946 DOA: 02/13/2015  Referring physician: Dr. Sharol Given. ER physician. PCP: [name], MD  Specialists: Dr. Henrene Pastor. Gastroenterologist. Dr. Risa Grill. Urologist.  Chief Complaint: Weakness.  HPI: [name] House is a 69 y.o. 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 Patient states he was doing fine the whole day and had a bath in the bathtub following which patient was finding it very weak to stand up. Over the last few days patient states he has been having some difficulty urinating with pain on urinating. Patient has history of urinary stones and felt that patient may be developing urinary stones. EMS was called and patient was brought to the ER. Patient was found to be nonfocal. In the ER patient was found to be febrile with urine showing features concerning for UTI and has been started on empiric antibiotics. On my exam patient states he feels better at this time but still weak. Denies any chest pain shortness of breath nausea vomiting abdominal pain and diarrhea. Denies any recent change of medications.  Review of Systems: As presented in the history of presenting illness, rest negative.  Past Medical History  Diagnosis Date  . Diabetes mellitus, type 2     resolved with gastric bypass  . Hyperlipemia   . Kidney stones   . Osteoarthritis of hip   . Hypertension   . Benign neoplasm of colon   . Diverticulosis of colon (without mention of hemorrhage)   . Iron deficiency anemia secondary to blood loss (chronic)   . Unspecified sleep apnea     Resolved  . Cirrhosis   . Liver cyst   . Renal cyst   . Esophageal varices   . Abdominal hernia    Past  Surgical History  Procedure Laterality Date  . Knee surgery      Bil  . Ankle surgery      Bil  . Gastric bypass  2005  . Total hip arthroplasty  2008    right  . Colonoscopy  2012  . Polypectomy     Social History:  reports that he has never smoked. He has never used smokeless tobacco. He reports that he drinks alcohol. He reports that he does not use illicit drugs. Where does patient live home. Can patient participate in ADLs? Yes.  Allergies  Allergen Reactions  . Oxycodone Other (See Comments)    Caused him to fall..really sedated    Family History:  Family History  Problem Relation Age of Onset  . Heart attack Father     deceased due to MI  . Heart disease Father   . Diabetes Mother     deceased due to complications of DM  . Coronary artery disease Brother   . Colon cancer Neg Hx   . Stomach cancer Neg Hx       Prior to Admission medications   Medication Sig Start Date End Date Taking? Authorizing Provider  Cholecalciferol (VITAMIN D3) 2000 UNITS TABS Take 1 capsule by mouth daily.     Yes Historical Provider, MD  diphenoxylate-atropine (LOMOTIL) 2.5-0.025 MG per tablet Take 1 tablet by mouth as directed.    Yes Historical Provider, MD  lisinopril (PRINIVIL,ZESTRIL) 20 MG tablet Take 20 mg by mouth daily.  Yes Historical Provider, MD  nadolol (CORGARD) 20 MG tablet Take 1 tablet (20 mg total) by mouth daily. 01/02/15  Yes Irene Shipper, MD  Multiple Vitamins-Minerals (MULTIVITAMINS THER. W/MINERALS) TABS Take 1 tablet by mouth daily.      Historical Provider, MD    Physical Exam: Filed Vitals:   02/14/15 0130 02/14/15 0200 02/14/15 0303 02/14/15 0345  BP: 139/73 148/69 143/98 145/67  Pulse: 75 79 83 72  Temp:    99.4 F (37.4 C)  TempSrc:    Oral  Resp:   19 20  SpO2: 95% 93% 95% 97%     General:  Well-developed and nourished.  Eyes: Anicteric no pallor.  ENT: No discharge from the ears eyes nose and mouth.  Neck: No mass felt.  Cardiovascular: S1  and S2 heard.  Respiratory: No rhonchi or crepitations.  Abdomen: Soft nontender bowel sounds present.  Skin: No rash.  Musculoskeletal: No edema.  Psychiatric: Appears normal.  Neurologic: Alert awake oriented to time place and person. Moves all extremities 5 x 5. No facial asymmetry. Tongue is midline.  Labs on Admission:  Basic Metabolic Panel:  Recent Labs Lab 02/14/15 0029  NA 134*  K 3.7  CL 111  CO2 18*  GLUCOSE 210*  BUN 15  CREATININE 0.91  CALCIUM 8.4*   Liver Function Tests:  Recent Labs Lab 02/14/15 0029  AST 33  ALT 26  ALKPHOS 132*  BILITOT 2.5*  PROT 5.8*  ALBUMIN 3.4*   No results for input(s): LIPASE, AMYLASE in the last 168 hours. No results for input(s): AMMONIA in the last 168 hours. CBC:  Recent Labs Lab 02/14/15 0029  WBC 6.0  NEUTROABS 5.0  HGB 12.5*  HCT 35.7*  MCV 88.6  PLT 53*   Cardiac Enzymes: No results for input(s): CKTOTAL, CKMB, CKMBINDEX, TROPONINI in the last 168 hours.  BNP (last 3 results) No results for input(s): BNP in the last 8760 hours.  ProBNP (last 3 results) No results for input(s): PROBNP in the last 8760 hours.  CBG: No results for input(s): GLUCAP in the last 168 hours.  Radiological Exams on Admission: No results found.   Assessment/Plan Principal Problem:   Generalized weakness Active Problems:   Iron deficiency anemia   Essential hypertension   UTI (lower urinary tract infection)   Diabetes mellitus type 2, controlled   Thrombocytopenia   Weakness   Urinary tract infectious disease   1. Generalized weakness probably secondary to fever secondary to UTI - patient has been placed on ceftriaxone. Follow cultures. Since patient had difficulty urinating patient was placed on Foley catheter. Check renal ultrasound. I have also ordered troponin and CK levels TSH. Get physical therapy consult. 2. UTI - see #1. 3. Diabetes mellitus type 2 - presently on no medications. Patient states he has  had last hemoglobin A1c checked at his primary care office in March which was within acceptable limits. At this time his blood sugar is in the 200s. I have placed patient on sliding scale coverage and we will check hemoglobin A1c. 4. Non-anion gap metabolic acidosis - patient's recent labs last one also shows similar picture. Closely follow metabolic panel patient is on gentle hydration. 5. Hypertension - continue present medications. 6. History of cirrhosis of liver secondary to [name] House - presently appears compensated closely observe. 7. Chronic iron deficiency anemia and thrombocytopenia - patient did receive iron therapy last week. Thrombocytopenia secondary to the hypersplenism will be closely followed.   DVT Prophylaxis SCDs as patient  as patient has thrombocytopenia.  Code Status: Full code.  Family Communication: Patient's wife at the bedside.  Disposition Plan: Admit to inpatient. Likely stay 1-2 days.    Mani Celestin N. [facility] Pager [phone].  If 7PM-7AM, please contact night-coverage [email or web address] Password [facility] 02/14/2015, 4:33 AM

## 2015-02-15 LAB — HEMOGLOBIN A1C
HEMOGLOBIN A1C: 7 % — AB (ref 4.8–5.6)
Mean Plasma Glucose: 154 mg/dL

## 2015-02-15 LAB — GLUCOSE, CAPILLARY: Glucose-Capillary: 162 mg/dL — ABNORMAL HIGH (ref 70–99)

## 2015-02-15 MED ORDER — CEFPODOXIME PROXETIL 200 MG PO TABS: 200.0000 mg | ORAL_TABLET | Freq: Two times a day (BID) | ORAL | Status: DC

## 2015-02-15 MED ORDER — TAMSULOSIN HCL 0.4 MG PO CAPS
0.4000 mg | ORAL_CAPSULE | Freq: Every day | ORAL | Status: DC
  Administered 2015-02-15: 0.4 mg via ORAL
  Filled 2015-02-15: qty 1

## 2015-02-15 MED ORDER — TAMSULOSIN HCL 0.4 MG PO CAPS: 0.4000 mg | ORAL_CAPSULE | Freq: Every day | ORAL | Status: DC

## 2015-02-15 MED ORDER — SODIUM CHLORIDE 0.9 % IV SOLN
INTRAVENOUS | Status: DC
  Administered 2015-02-15: 08:00:00 via INTRAVENOUS

## 2015-02-15 NOTE — Progress Notes (Signed)
Pt to d/c home with foley catheter, per MD instructions. Standard drainage bag changed to a leg bag and patient taught how to remove, empty, and change to drainage bag at bedtime. Return demonstration used to ensure understanding. Attempted to call Dr. Shon Baton office as well as Dr. Cy Blamer office and both offices said they would contact the patient. Hospital RN not able to make the appointment for patient at this time. Patient understands to wait for their call and to follow-up tomorrow if he doesn't hear from each office. New medications explained and prescriptions given. Pt in no pain and in no distress, other than being a little anxious about possible BPH issues and going home with catheter. Patient reassured, and education of catheter care/cleaning given.

## 2015-02-15 NOTE — Discharge Instructions (Signed)
Follow with Primary MD [name], MD and Dr Risa Grill in 3  Days, follow final Culture results  Get CBC, CMP, 2 view Chest X ray checked  by Primary MD next visit.    Activity: As tolerated with Full fall precautions use walker/cane & assistance as needed   Disposition Home    Diet: Heart Healthy  Low carb.  For Heart failure patients - Check your Weight same time everyday, if you gain over 2 pounds, or you develop in leg swelling, experience more shortness of breath or chest pain, call your Primary MD immediately. Follow Cardiac Low Salt Diet and 1.5 lit/day fluid restriction.   On your next visit with your primary care physician please Get Medicines reviewed and adjusted.   Please request your Prim.MD to go over all Hospital Tests and Procedure/Radiological results at the follow up, please get all Hospital records sent to your Prim MD by signing hospital release before you go home.   If you experience worsening of your admission symptoms, develop shortness of breath, life threatening emergency, suicidal or homicidal thoughts you must seek medical attention immediately by calling 911 or calling your MD immediately  if symptoms less severe.  You Must read complete instructions/literature along with all the possible adverse reactions/side effects for all the Medicines you take and that have been prescribed to you. Take any new Medicines after you have completely understood and accpet all the possible adverse reactions/side effects.   Do not drive, operating heavy machinery, perform activities at heights, swimming or participation in water activities or provide baby sitting services if your were admitted for syncope or siezures until you have seen by Primary MD or a Neurologist and advised to do so again.  Do not drive when taking Pain medications.    Do not take more than prescribed Pain, Sleep and Anxiety Medications  Special Instructions: If you have smoked or chewed Tobacco   in the last 2 yrs please stop smoking, stop any regular Alcohol  and or any Recreational drug use.  Wear Seat belts while driving.   Please note  You were cared for by a hospitalist during your hospital stay. If you have any questions about your discharge medications or the care you received while you were in the hospital after you are discharged, you can call the unit and asked to speak with the hospitalist on call if the hospitalist that took care of you is not available. Once you are discharged, your primary care physician will handle any further medical issues. Please note that NO REFILLS for any discharge medications will be authorized once you are discharged, as it is imperative that you return to your primary care physician (or establish a relationship with a primary care physician if you do not have one) for your aftercare needs so that they can reassess your need for medications and monitor your lab values.

## 2015-02-15 NOTE — Discharge Summary (Signed)
[name] House, is a 69 y.o. male  DOB 01-01-1946  MRN [id number].  Admission date:  02/13/2015  Admitting Physician  Rise Patience, MD  Discharge Date:  02/15/2015   Primary MD  [name], MD  Recommendations for primary care physician for things to follow:   Repeat CBC, BMP next visit. Kindly follow final urine culture and blood culture results.  His outpatient urology follow-up   Admission Diagnosis  Fever and chills [R50.9] Weakness [R53.1] Urinary tract infection without hematuria, site unspecified [N39.0]   Discharge Diagnosis  Fever and chills [R50.9] Weakness [R53.1] Urinary tract infection without hematuria, site unspecified [N39.0]    Principal Problem:   Generalized weakness Active Problems:   Iron deficiency anemia   Essential hypertension   UTI (lower urinary tract infection)   Diabetes mellitus type 2, controlled   Thrombocytopenia   Weakness   Urinary tract infectious disease      Past Medical History  Diagnosis Date  . Diabetes mellitus, type 2     resolved with gastric bypass  . Hyperlipemia   . Kidney stones   . Osteoarthritis of hip   . Hypertension   . Benign neoplasm of colon   . Diverticulosis of colon (without mention of hemorrhage)   . Iron deficiency anemia secondary to blood loss (chronic)   . Unspecified sleep apnea     Resolved  . Cirrhosis   . Liver cyst   . Renal cyst   . Esophageal varices   . Abdominal hernia     Past Surgical History  Procedure Laterality Date  . Knee surgery      Bil  . Ankle surgery      Bil  . Gastric bypass  2005  . Total hip arthroplasty  2008    right  . Colonoscopy  2012  . Polypectomy         History of present illness and  Hospital Course:     Kindly see H&P for history of present illness and admission  details, please review complete Labs, Consult reports and Test reports for all details in brief  HPI  from the history and physical done on the day of admission  [name] House is a 69 y.o. 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 Patient states he was doing fine the whole day and had a bath in the bathtub following which patient was finding it very weak to stand up. Over the last few days patient states he has been having some difficulty urinating with pain on urinating. Patient has history of urinary stones and felt that patient may be developing urinary stones. EMS was called and patient was brought to the ER. Patient was found to be nonfocal. In the ER patient was found to be febrile with urine showing features concerning  for UTI and has been started on empiric antibiotics. On my exam patient states he feels better at this time but still weak. Denies any chest pain shortness of breath nausea vomiting abdominal pain and diarrhea. Denies any recent change of medications.  Hospital Course   1. UTI with Urinary retention. Suspicious for BPH undiagnosed, retention relieved by Foley catheter, he has improved and feels at baseline after IV Rocephin and IV fluids, renal ultrasound stable. We will place him on Flomax, oral Vantin for 5 more days, discharged with Foley and outpatient urology follow-up for further workup. We'll request PCP to please monitor final culture results in 3-4 days.   2. DM type II. Diet controlled. Follow with PCP, might require treatment soon.  Lab Results  Component Value Date   HGBA1C 7.0* 02/14/2015    3. Dehydration caused low bicarbonate resolved with hydration. Repeat BMP by PCP in a week.   4. [name] with chronic  thrombocytopenia and iron deficiency anemia. Outpatient follow-up with PCP and patient's primary gastroenterologist.  5. Essential hypertension. Continue home medications unchanged.   Discharge Condition: Stable   Follow UP  Follow-up Information    Follow up with [name], MD. Schedule an appointment as soon as possible for a visit in 3 days.   Specialty:  Internal Medicine   Contact information:   [address] [place] [address] [phone]       Follow up with Bernestine Amass, MD. Schedule an appointment as soon as possible for a visit in 3 days.   Specialty:  Urology   Why:  Bladder obstruction   Contact information:   [place] [address] [phone]         Discharge Instructions  and  Discharge Medications    Discharge Instructions    Discharge instructions    Complete by:  As directed   Follow with Primary MD [name], MD and Dr Risa Grill in 3  Days, follow final Culture results  Get CBC, CMP, 2 view Chest X ray checked  by Primary MD next visit.    Activity: As tolerated with Full fall precautions use walker/cane & assistance as needed   Disposition Home    Diet: Heart Healthy  Low carb.  For Heart failure patients - Check your Weight same time everyday, if you gain over 2 pounds, or you develop in leg swelling, experience more shortness of breath or chest pain, call your Primary MD immediately. Follow Cardiac Low Salt Diet and 1.5 lit/day fluid restriction.   On your next visit with your primary care physician please Get Medicines reviewed and adjusted.   Please request your Prim.MD to go over all Hospital Tests and Procedure/Radiological results at the follow up, please get all Hospital records sent to your Prim MD by signing hospital release before you go home.   If you experience worsening of your admission symptoms, develop shortness of breath, life threatening emergency, suicidal or homicidal thoughts you  must seek medical attention immediately by calling 911 or calling your MD immediately  if symptoms less severe.  You Must read complete instructions/literature along with all the possible adverse reactions/side effects for all the Medicines you take and that have been prescribed to you. Take any new Medicines after you have completely understood and accpet all the possible adverse reactions/side effects.   Do not drive, operating heavy machinery, perform activities at heights, swimming or participation in water activities or provide baby sitting services if your were admitted for syncope  or siezures until you have seen by Primary MD or a Neurologist and advised to do so again.  Do not drive when taking Pain medications.    Do not take more than prescribed Pain, Sleep and Anxiety Medications  Special Instructions: If you have smoked or chewed Tobacco  in the last 2 yrs please stop smoking, stop any regular Alcohol  and or any Recreational drug use.  Wear Seat belts while driving.   Please note  You were cared for by a hospitalist during your hospital stay. If you have any questions about your discharge medications or the care you received while you were in the hospital after you are discharged, you can call the unit and asked to speak with the hospitalist on call if the hospitalist that took care of you is not available. Once you are discharged, your primary care physician will handle any further medical issues. Please note that NO REFILLS for any discharge medications will be authorized once you are discharged, as it is imperative that you return to your primary care physician (or establish a relationship with a primary care physician if you do not have one) for your aftercare needs so that they can reassess your need for medications and monitor your lab values.     Increase activity slowly    Complete by:  As directed             Medication List    TAKE these medications         cefpodoxime 200 MG tablet  Commonly known as:  VANTIN  Take 1 tablet (200 mg total) by mouth 2 (two) times daily.     diphenoxylate-atropine 2.5-0.025 MG per tablet  Commonly known as:  LOMOTIL  Take 1 tablet by mouth as directed.     lisinopril 20 MG tablet  Commonly known as:  PRINIVIL,ZESTRIL  Take 20 mg by mouth daily.     multivitamins ther. w/minerals Tabs tablet  Take 1 tablet by mouth daily.     nadolol 20 MG tablet  Commonly known as:  CORGARD  Take 1 tablet (20 mg total) by mouth daily.     tamsulosin 0.4 MG Caps capsule  Commonly known as:  FLOMAX  Take 1 capsule (0.4 mg total) by mouth daily.     Vitamin D3 2000 UNITS Tabs  Take 1 capsule by mouth daily.          Diet and Activity recommendation: See Discharge Instructions above   Consults obtained - None   Major procedures and Radiology Reports - PLEASE review detailed and final reports for all details, in brief -      US Renal  02/14/2015   CLINICAL DATA:  69 year old male with difficulty urinating. Fever, hematuria. Initial encounter. Current history of cirrhosis.  EXAM: RENAL / URINARY TRACT ULTRASOUND COMPLETE  COMPARISON:  CT Abdomen and Pelvis 07/02/2013.  FINDINGS: Right Kidney:  Length: 12.7 cm. Large exophytic simple appearing 11 cm cyst has mildly increased in size since 2014. Smaller anterior exophytic cyst measuring 3 cm is stable. No hydronephrosis. Cortical echogenicity is at the upper limits of normal. Chronic multifocal nephrolithiasis Re identified.  Left Kidney:  Length: 12.7 cm. No hydronephrosis. Cortical echotexture within normal limits.  Bladder:  Not visualized, Foley catheter in place.  Other findings: Splenomegaly with calculated splenic volume of 2444 mL (normal splenic volume range 83 - 412 mL). Small volume ascites around the liver and spleen. Right pleural effusion. Nodular liver contour.  IMPRESSION: 1. No  hydronephrosis or acute renal findings. Bladder not visualized, Foley  catheter in place. 2. Cirrhosis and portal hypertension. Splenomegaly, small volume ascites, right pleural effusion.   Electronically Signed   By: Genevie Ann M.D.   On: 02/14/2015 07:50    Micro Results     Recent Results (from the past 240 hour(s))  Culture, blood (routine x 2)     Status: None (Preliminary result)   Collection Time: 02/14/15 12:29 AM  Result Value Ref Range Status   Specimen Description BLOOD RIGHT ANTECUBITAL  Final   Special Requests BOTTLES DRAWN AEROBIC AND ANAEROBIC 5ML  Final   Culture   Final           BLOOD CULTURE RECEIVED NO GROWTH TO DATE CULTURE WILL BE HELD FOR 5 DAYS BEFORE ISSUING A FINAL NEGATIVE REPORT Performed at [facility]    Report Status PENDING  Incomplete  Culture, blood (routine x 2)     Status: None (Preliminary result)   Collection Time: 02/14/15 12:29 AM  Result Value Ref Range Status   Specimen Description BLOOD R FA  Final   Special Requests BOTTLES DRAWN AEROBIC AND ANAEROBIC 5ML  Final   Culture   Final           BLOOD CULTURE RECEIVED NO GROWTH TO DATE CULTURE WILL BE HELD FOR 5 DAYS BEFORE ISSUING A FINAL NEGATIVE REPORT Performed at [facility]    Report Status PENDING  Incomplete       Today   Subjective:   [name] today has no headache,no chest abdominal pain,no new weakness tingling or numbness, feels much better wants to go home today.   Objective:   Blood pressure 116/58, pulse 56, temperature 98.6 F (37 C), temperature source Oral, resp. rate 16, height 6' (1.829 m), weight 113.399 kg (250 lb), SpO2 96 %.   Intake/Output Summary (Last 24 hours) at 02/15/15 0807 Last data filed at 02/15/15 0555  Gross per 24 hour  Intake 1851.66 ml  Output   2575 ml  Net -723.34 ml    Exam Awake Alert, Oriented x 3, No new F.N deficits, Normal affect [place].AT,PERRAL Supple Neck,No JVD, No cervical lymphadenopathy appriciated.  Symmetrical Chest wall movement, Good air movement bilaterally, CTAB RRR,No  Gallops,Rubs or new Murmurs, No Parasternal Heave +ve B.Sounds, Abd Soft, Non tender, No organomegaly appriciated, No rebound -guarding or rigidity. No Cyanosis, Clubbing or edema, No new Rash or bruise  Data Review   CBC w Diff: Lab Results  Component Value Date   WBC 5.1 02/14/2015   WBC 3.1* 01/27/2015   HGB 12.0* 02/14/2015   HGB 12.0* 01/27/2015   HCT 34.5* 02/14/2015   HCT 35.5* 01/27/2015   PLT 48* 02/14/2015   PLT 45* 01/27/2015   LYMPHOPCT 7* 02/14/2015   LYMPHOPCT 11.4* 01/27/2015   MONOPCT 10 02/14/2015   MONOPCT 6.6 01/27/2015   EOSPCT 1 02/14/2015   EOSPCT 3.6 01/27/2015   BASOPCT 0 02/14/2015   BASOPCT 0.4 01/27/2015    CMP: Lab Results  Component Value Date   NA 139 02/14/2015   NA 140 01/27/2015   K 3.8 02/14/2015   K 3.9 01/27/2015   CL 110 02/14/2015   CL 112* 01/13/2013   CO2 23 02/14/2015   CO2 18* 01/27/2015   BUN 14 02/14/2015   BUN 14.2 01/27/2015   CREATININE 0.97 02/14/2015   CREATININE 0.9 01/27/2015   PROT 5.8* 02/14/2015   PROT 5.5* 01/27/2015   ALBUMIN 3.4* 02/14/2015  ALBUMIN 3.3* 01/27/2015   BILITOT 2.5* 02/14/2015   BILITOT 1.77* 01/27/2015   ALKPHOS 132* 02/14/2015   ALKPHOS 177* 01/27/2015   AST 33 02/14/2015   AST 43* 01/27/2015   ALT 26 02/14/2015   ALT 43 01/27/2015  .   Total Time in preparing paper work, data evaluation and todays exam - 35 minutes  Thurnell Lose M.D on 02/15/2015 at 8:07 AM  [facility]   Office  [phone]

## 2015-02-16 ENCOUNTER — Emergency Department (HOSPITAL_COMMUNITY)
Admission: EM | Admit: 2015-02-16 | Discharge: 2015-02-16 | Discharge status: Absent without leave | Payer: Medicare Other

## 2015-02-16 LAB — URINE CULTURE: Colony Count: >=100000

## 2015-02-20 ENCOUNTER — Telehealth: Payer: Self-pay | Admitting: Internal Medicine

## 2015-02-20 LAB — CULTURE, BLOOD (ROUTINE X 2)
CULTURE: NO GROWTH
CULTURE: NO GROWTH

## 2015-02-20 NOTE — Telephone Encounter (Signed)
Pt scheduled to see Dr. Henrene Pastor 02/27/15@2 :15pm. Pt aware.

## 2015-02-23 ENCOUNTER — Other Ambulatory Visit: Payer: Self-pay | Admitting: Orthopedic Surgery

## 2015-02-23 ENCOUNTER — Ambulatory Visit (HOSPITAL_COMMUNITY): Payer: Medicare Other | Attending: Cardiology

## 2015-02-23 DIAGNOSIS — I2510 Atherosclerotic heart disease of native coronary artery without angina pectoris: Secondary | ICD-10-CM | POA: Diagnosis not present

## 2015-02-23 DIAGNOSIS — E119 Type 2 diabetes mellitus without complications: Secondary | ICD-10-CM | POA: Insufficient documentation

## 2015-02-23 DIAGNOSIS — M7989 Other specified soft tissue disorders: Secondary | ICD-10-CM

## 2015-02-23 DIAGNOSIS — I10 Essential (primary) hypertension: Secondary | ICD-10-CM | POA: Diagnosis not present

## 2015-02-23 DIAGNOSIS — E785 Hyperlipidemia, unspecified: Secondary | ICD-10-CM | POA: Insufficient documentation

## 2015-02-27 ENCOUNTER — Ambulatory Visit (INDEPENDENT_AMBULATORY_CARE_PROVIDER_SITE_OTHER): Payer: Medicare Other | Admitting: Internal Medicine

## 2015-02-27 ENCOUNTER — Encounter: Payer: Self-pay | Admitting: Internal Medicine

## 2015-02-27 VITALS — BP 144/72 | HR 61 | Ht 72.0 in | Wt 238.0 lb

## 2015-02-27 DIAGNOSIS — Z86010 Personal history of colonic polyps: Secondary | ICD-10-CM

## 2015-02-27 DIAGNOSIS — R197 Diarrhea, unspecified: Secondary | ICD-10-CM

## 2015-02-27 DIAGNOSIS — K7460 Unspecified cirrhosis of liver: Secondary | ICD-10-CM | POA: Diagnosis not present

## 2015-02-27 DIAGNOSIS — K766 Portal hypertension: Secondary | ICD-10-CM | POA: Diagnosis not present

## 2015-02-27 DIAGNOSIS — R140 Abdominal distension (gaseous): Secondary | ICD-10-CM

## 2015-02-27 DIAGNOSIS — I8500 Esophageal varices without bleeding: Secondary | ICD-10-CM

## 2015-02-27 MED ORDER — METRONIDAZOLE 250 MG PO TABS: 250.0000 mg | ORAL_TABLET | Freq: Three times a day (TID) | ORAL | Status: DC

## 2015-02-27 NOTE — Progress Notes (Signed)
HISTORY OF PRESENT ILLNESS:  [name] House is a 69 y.o. male with multiple medical problems who is status post Roux-en-Y gastric bypass surgery in 2005. He is followed in this office for [name] House cirrhosis with portal hypertension and adenomatous colon polyps. The patient was last evaluated in the office November 2014. See that dictation for details. Screening upper endoscopy that same month revealed 1 column of grade 2 varix without stigmata. He has been on nadolol for primary prophylaxis against variceal bleeding with adequate reduction and pulse rate. He has had problems with ascites which have been managed most recently with salt avoidance. Last colonoscopy October 2015 with 1 tubular adenoma removed. Moderate diverticulosis in the sigmoid colon as well as portal hypertensive colopathy. He was hospitalized 2 weeks ago with symptomatic urinary tract infection. At the time of discharge she was instructed to follow-up in this office. He is accompanied today by his wife. He denies problems with ascites, encephalopathy, or GI bleeding. He has been less compliant with sodium restriction. Some ankle edema. He continues on nadolol. He is accompanied by his wife. New complaint today is that of increased intestinal gas. As well, postprandial urgency with loose stools occasionally once per day. Generally after normal bowel movement the morning and before 1 PM. No incontinence. Blood work from earlier this month reveals an albumin of 3.4 with total bilirubin 2.5. Hemoglobin 12.5 with platelet count 53,000.  REVIEW OF SYSTEMS:  All non-GI ROS negative except for arthritis  Past Medical History  Diagnosis Date  . Diabetes mellitus, type 2     resolved with gastric bypass  . Hyperlipemia   . Kidney stones   . Osteoarthritis of hip   . Hypertension   . Benign neoplasm of colon   . Diverticulosis of colon (without mention of hemorrhage)   . Iron deficiency anemia secondary to blood loss (chronic)   . Unspecified  sleep apnea     Resolved  . Cirrhosis   . Liver cyst   . Renal cyst   . Esophageal varices   . Abdominal hernia     Past Surgical History  Procedure Laterality Date  . Knee surgery      Bil  . Ankle surgery      Bil  . Gastric bypass  2005  . Total hip arthroplasty  2008    right  . Colonoscopy  2012  . Polypectomy      Social History [name] House  reports that he has never smoked. He has never used smokeless tobacco. He reports that he drinks alcohol. He reports that he does not use illicit drugs.  family history includes Coronary artery disease in his brother; Diabetes in his mother; Heart attack in his father; Heart disease in his father. There is no history of Colon cancer or Stomach cancer.  Allergies  Allergen Reactions  . Oxycodone Other (See Comments)    Caused him to fall..really sedated       PHYSICAL EXAMINATION: Vital signs: BP 144/72 mmHg  Pulse 61  Ht 6' (1.829 m)  Wt 238 lb (107.956 kg)  BMI 32.27 kg/m2 General: Chronically ill-appearing but Well-developed, well-nourished, no acute distress HEENT: Sclerae are anicteric, conjunctiva pink. Oral mucosa intact Lungs: Clear Heart: Regular Abdomen: soft, obese, nontender, nondistended, no obvious ascites, no peritoneal signs, normal bowel sounds. No organomegaly. Extremities: 1+ edema bilaterally Psychiatric: alert and oriented x3. Cooperative Neuro: No asterixis   ASSESSMENT:  #1. NASH cirrhosis with portal hypertension. Clinically compensated #2. Esophageal varices as  described. On nadolol with adequate pulse reduction for primary prophylaxis against bleeding #3. Status post Roux-en-Y gastric bypass surgery 2005 #4. History of multiple adenomatous colon polyps. Last colonoscopy October 2015 #5. Problems with postprandial urgency with loose stools on occasion #6. Increased intestinal gas. Rule out bacterial overgrowth #7. Multiple medical problems   PLAN:  #1. Continue nadolol #2. Prescribe  metronidazole 250 mg 3 times a day 10 days for possible bacterial overgrowth #3. Discussion today on intestinal gas #4. Discussion today on his liver disease #5. Routine GI follow-up 6 months. Sooner if needed  25 minutes was spent face-to-face with the patient and his wife. Greater than 50% of the time was used for counseling regarding his GI conditions as well as answering multiple questions from he and his wife

## 2015-02-27 NOTE — Patient Instructions (Signed)
We have sent  medications to your pharmacy for you to pick up at your convenience. CC:  Leanna Battles MD

## 2015-04-03 ENCOUNTER — Other Ambulatory Visit: Payer: Self-pay

## 2015-04-07 ENCOUNTER — Other Ambulatory Visit: Payer: Self-pay | Admitting: Internal Medicine

## 2015-07-14 ENCOUNTER — Telehealth: Payer: Self-pay | Admitting: Internal Medicine

## 2015-07-14 NOTE — Telephone Encounter (Signed)
Left a message for patient to call back. 

## 2015-07-19 NOTE — Telephone Encounter (Signed)
Pt calling to scheduled follow-up appt. Pt scheduled to see Dr. Henrene Pastor 08/24/15@2 :45pm. Pt aware of appt.

## 2015-07-28 ENCOUNTER — Ambulatory Visit (HOSPITAL_BASED_OUTPATIENT_CLINIC_OR_DEPARTMENT_OTHER): Payer: Medicare Other | Admitting: Oncology

## 2015-07-28 ENCOUNTER — Other Ambulatory Visit (HOSPITAL_BASED_OUTPATIENT_CLINIC_OR_DEPARTMENT_OTHER): Payer: Medicare Other

## 2015-07-28 ENCOUNTER — Telehealth: Payer: Self-pay | Admitting: Oncology

## 2015-07-28 VITALS — BP 147/65 | HR 62 | Temp 98.0°F | Resp 20 | Ht 73.0 in | Wt 242.2 lb

## 2015-07-28 DIAGNOSIS — D509 Iron deficiency anemia, unspecified: Secondary | ICD-10-CM

## 2015-07-28 DIAGNOSIS — D6959 Other secondary thrombocytopenia: Secondary | ICD-10-CM | POA: Diagnosis not present

## 2015-07-28 DIAGNOSIS — D508 Other iron deficiency anemias: Secondary | ICD-10-CM

## 2015-07-28 DIAGNOSIS — K7581 Nonalcoholic steatohepatitis (NASH): Secondary | ICD-10-CM | POA: Diagnosis not present

## 2015-07-28 DIAGNOSIS — Z9884 Bariatric surgery status: Secondary | ICD-10-CM | POA: Diagnosis not present

## 2015-07-28 LAB — COMPREHENSIVE METABOLIC PANEL (CC13)
ALT: 39 U/L (ref 0–55)
AST: 45 U/L — AB (ref 5–34)
Albumin: 3.4 g/dL — ABNORMAL LOW (ref 3.5–5.0)
Alkaline Phosphatase: 173 U/L — ABNORMAL HIGH (ref 40–150)
Anion Gap: 6 mEq/L (ref 3–11)
BUN: 13.1 mg/dL (ref 7.0–26.0)
CALCIUM: 8.6 mg/dL (ref 8.4–10.4)
CHLORIDE: 115 meq/L — AB (ref 98–109)
CO2: 19 meq/L — AB (ref 22–29)
CREATININE: 1 mg/dL (ref 0.7–1.3)
EGFR: 81 mL/min/{1.73_m2} — ABNORMAL LOW (ref >90)
GLUCOSE: 157 mg/dL — AB (ref 70–140)
Potassium: 4.1 mEq/L (ref 3.5–5.1)
Sodium: 140 mEq/L (ref 136–145)
Total Bilirubin: 1.89 mg/dL — ABNORMAL HIGH (ref 0.20–1.20)
Total Protein: 5.4 g/dL — ABNORMAL LOW (ref 6.4–8.3)

## 2015-07-28 LAB — IRON AND TIBC CHCC
%SAT: 45 % (ref 20–55)
Iron: 113 ug/dL (ref 42–163)
TIBC: 249 ug/dL (ref 202–409)
UIBC: 136 ug/dL (ref 117–376)

## 2015-07-28 LAB — CBC WITH DIFFERENTIAL/PLATELET
BASO%: 0.3 % (ref 0.0–2.0)
Basophils Absolute: 0 10*3/uL (ref 0.0–0.1)
EOS%: 3.6 % (ref 0.0–7.0)
Eosinophils Absolute: 0.1 10*3/uL (ref 0.0–0.5)
HEMATOCRIT: 36.5 % — AB (ref 38.4–49.9)
HGB: 12.4 g/dL — ABNORMAL LOW (ref 13.0–17.1)
LYMPH#: 0.3 10*3/uL — AB (ref 0.9–3.3)
LYMPH%: 10.9 % — ABNORMAL LOW (ref 14.0–49.0)
MCH: 30.9 pg (ref 27.2–33.4)
MCHC: 34.1 g/dL (ref 32.0–36.0)
MCV: 90.6 fL (ref 79.3–98.0)
MONO#: 0.2 10*3/uL (ref 0.1–0.9)
MONO%: 7 % (ref 0.0–14.0)
NEUT#: 2.4 10*3/uL (ref 1.5–6.5)
NEUT%: 78.2 % — ABNORMAL HIGH (ref 39.0–75.0)
Platelets: 50 10*3/uL — ABNORMAL LOW (ref 140–400)
RBC: 4.03 10*6/uL — AB (ref 4.20–5.82)
RDW: 13.4 % (ref 11.0–14.6)
WBC: 3 10*3/uL — ABNORMAL LOW (ref 4.0–10.3)

## 2015-07-28 LAB — FERRITIN CHCC: Ferritin: 221 ng/ml (ref 22–316)

## 2015-07-28 NOTE — Telephone Encounter (Signed)
per pof to sch pt appt-gave pt copy of avs °

## 2015-07-28 NOTE — Progress Notes (Signed)
Hematology and Oncology Follow Up Visit  [name] House [id number] 02-May-1946 69 y.o. 07/28/2015 9:35 AM  Principle Diagnosis: This is a 69 year old gentleman with the following diagnoses:  1. Iron deficiency anemia. This is due to a history of gastric bypass operation.  He has been receiving intermittent iron replacement, most recently of which was in 01/2014. 2.    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   Interim History: [name] House presents today for a followup visit. Since last visit, he reports more fluid retention including lower extremity edema and possible ascites. He have reported weight gain and was started on diuretic but did not continue on it because of frequent urination. He has not reported any bleeding such as hematochezia or melena. He was hospitalized back in May 2016 for a urinary tract infection.   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 He has reported decrease in his exercise tolerance indicating possible need for IV iron infusion. He has reported increase in his hip pain and considering hip replacement in the future.   He has not reported any any abdominal pain, he does report occasional diarrhea and takes Lomotil for it.  He does report some occasional abdominal distention, but this is again not associated with any nausea, not associated with any vomiting. He has not reported any headaches blurred vision double vision or other mental status. Has not reported any chest pain shortness of breath or cough or hemoptysis. He has not reported any petechiae or or lymphadenopathy. Has not reported any frequency urgency or hematuria. Rest of his review of systems unremarkable.   Medications: I have reviewed the patient's current medications. Current Outpatient Prescriptions  Medication Sig Dispense Refill  . lisinopril (PRINIVIL,ZESTRIL) 20 MG tablet Take 20 mg by  mouth daily.    . nadolol (CORGARD) 20 MG tablet TAKE 1 TABLET BY MOUTH DAILY 90 tablet 1  . diphenoxylate-atropine (LOMOTIL) 2.5-0.025 MG per tablet Take 1 tablet by mouth as directed.     . metroNIDAZOLE (FLAGYL) 250 MG tablet Take 1 tablet (250 mg total) by mouth 3 (three) times daily. (Patient not taking: Reported on 07/28/2015) 30 tablet 0   No current facility-administered medications for this visit.     Allergies:  Allergies  Allergen Reactions  . Oxycodone Other (See Comments)    Caused him to fall..really sedated    Past Medical History, Surgical history, Social history, and Family History were reviewed and updated.   Physical Exam:  Blood pressure 147/65, pulse 62, temperature 98 F (36.7 C), temperature source Oral, resp. rate 20, height 6\' 1"  (1.854 m), weight 242 lb 3.2 oz (109.861 kg), SpO2 100 %. ECOG: 1 General appearance: alert awake chronically ill-appearing gentleman without distress today.  Head: Normocephalic, without obvious abnormality Neck: no adenopathy Lymph nodes: Cervical, supraclavicular, and axillary nodes normal. Heart:regular rate and rhythm, S1, S2 normal, no murmur, click, rub or gallop Lung:chest clear, no wheezing, rales, normal symmetric air entry Abdomin: Soft, slightly distended with good bowel sounds. Possible ascites or shifting dullness noted. EXT: Lower extremity edema noted bilaterally.   Lab Results: Lab Results  Component Value Date   WBC 3.0* 07/28/2015   HGB 12.4* 07/28/2015   HCT 36.5* 07/28/2015   MCV 90.6 07/28/2015   PLT 50* 07/28/2015     Chemistry      Component Value Date/Time   NA 139 02/14/2015 0447   NA 140 01/27/2015 6834  K 3.8 02/14/2015 0447   K 3.9 01/27/2015 0838   CL 110 02/14/2015 0447   CL 112* 01/13/2013 0840   CO2 23 02/14/2015 0447   CO2 18* 01/27/2015 0838   BUN 14 02/14/2015 0447   BUN 14.2 01/27/2015 0838   CREATININE 0.97 02/14/2015 0447   CREATININE 0.9 01/27/2015 0838      Component  Value Date/Time   CALCIUM 8.6* 02/14/2015 0447   CALCIUM 8.2* 01/27/2015 0838   ALKPHOS 132* 02/14/2015 0029   ALKPHOS 177* 01/27/2015 0838   AST 33 02/14/2015 0029   AST 43* 01/27/2015 0838   ALT 26 02/14/2015 0029   ALT 43 01/27/2015 0838   BILITOT 2.5* 02/14/2015 0029   BILITOT 1.77* 01/27/2015 0838       Impression and Plan:  A 69 year old gentleman with the following issues:  1. Thrombocytopenia, differential diagnosis including hypersplenism due to portal hypertension and liver disease. No active bleeding noted at this time. His platelet count remained stable around 50 without risk of any spontaneous bleeding. 2.     Iron deficiency anemia. He appears to be more symptomatic at this time. I am repeating his iron studies plan to give him IV iron in the near future if his iron studies do indicate iron deficiency. 3.      Future replacement surgery: His bolus should be adequate to withstand orthopedic operation. He will need his coagulation parameters checked including PT and PTT given his liver disease. 4.      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 He had all her questions regarding the prognosis as well as treatment options regarding his liver disease and I encouraged him to discuss this with Dr. Henrene Pastor and his next appointment in November 2016. 3.      Followup: Will be in 6 months.    [facility] MD 10/21/20169:35 AM

## 2015-08-24 ENCOUNTER — Encounter: Payer: Self-pay | Admitting: Internal Medicine

## 2015-08-24 ENCOUNTER — Ambulatory Visit (INDEPENDENT_AMBULATORY_CARE_PROVIDER_SITE_OTHER): Payer: Medicare Other | Admitting: Internal Medicine

## 2015-08-24 VITALS — BP 122/70 | HR 78 | Ht 71.5 in | Wt 218.0 lb

## 2015-08-24 DIAGNOSIS — K7581 Nonalcoholic steatohepatitis (NASH): Secondary | ICD-10-CM | POA: Diagnosis not present

## 2015-08-24 DIAGNOSIS — K7460 Unspecified cirrhosis of liver: Secondary | ICD-10-CM

## 2015-08-24 DIAGNOSIS — R188 Other ascites: Secondary | ICD-10-CM | POA: Diagnosis not present

## 2015-08-24 DIAGNOSIS — D509 Iron deficiency anemia, unspecified: Secondary | ICD-10-CM | POA: Diagnosis not present

## 2015-08-24 DIAGNOSIS — I8500 Esophageal varices without bleeding: Secondary | ICD-10-CM | POA: Diagnosis not present

## 2015-08-24 NOTE — Patient Instructions (Signed)
You have been scheduled for an abdominal ultrasound at [facility] (1st floor of hospital) on 08/28/2015 at 9:30am. Please arrive 15 minutes prior to your appointment for registration. Make certain not to have anything to eat or drink 6 hours prior to your appointment. Should you need to reschedule your appointment, please contact radiology at [phone]. This test typically takes about 30 minutes to perform.   Please follow up with Dr. Henrene Pastor in 6 months

## 2015-08-24 NOTE — Progress Notes (Signed)
HISTORY OF PRESENT ILLNESS:  [name] House is a 69 y.o. male with multiple medical problems who is status post Roux-en-Y gastric bypass surgery in 2005. He is followed in this office for NASH cirrhosis with portal hypertension. He also has a history of adenomatous colon polyps. He was last evaluated in the office 02/27/2015. See that dictation. He continues on nadolol 20 mg daily for primary prophylaxis against esophageal varices noted on upper endoscopy November 2014. He is accompanied by his wife. Since his last visit his chief complaints are fatigue and a protruding abdomen which was quite prominent 6 weeks ago. He denies problems with GI bleeding, or change in mental status. He does report ankle edema on the left. Recently seen by his hematologist for gastric bypass surgery related iron deficiency anemia. His last abdominal ultrasound was October 2014 with hepatic cirrhosis and no evidence for ascites. His last CT scan of the abdomen and pelvis was September 2014. Hepatic cirrhosis and splenomegaly as well as abdominal and pelvic ascites noted at that time. He does have an umbilical hernia. He and his wife have visited "a holistic doctor" who recommended powdered supplements for various and sundry reasons. They ask my opinion. I told them I could not comment on this process with no knowledge or insight. 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 Diabetes mellitus, type 2 ([place])     resolved with gastric bypass  . Hyperlipemia   . Kidney stones   . Osteoarthritis of hip   . Hypertension   . Benign neoplasm of colon   . Diverticulosis of colon (without mention of hemorrhage)   . Iron deficiency anemia secondary to blood loss (chronic)   . Unspecified sleep apnea     Resolved  . Cirrhosis ([place])   . Liver cyst   . Renal cyst   . Esophageal  varices ([place])   . Abdominal hernia     Past Surgical History  Procedure Laterality Date  . Knee surgery      Bil  . Ankle surgery      Bil  . Gastric bypass  2005  . Total hip arthroplasty  2008    right  . Colonoscopy  2012  . Polypectomy      Social History [name] House  reports that he has never smoked. He has never used smokeless tobacco. He reports that he drinks alcohol. He reports that he does not use illicit drugs.  family history includes Coronary artery disease in his brother; Diabetes in his mother; Heart attack in his father; Heart disease in his father. There is no history of Colon cancer or Stomach cancer.  Allergies  Allergen Reactions  . Oxycodone Other (See Comments)    Caused him to fall..really sedated       PHYSICAL EXAMINATION: Vital signs: BP 122/70 mmHg  Pulse 78  Ht 5' 11.5" (1.816 m)  Wt 218 lb (98.884 kg)  BMI 29.98 kg/m2  SpO2 98%  Constitutional: Chronically ill-appearing, no acute distress Psychiatric: alert and oriented x3, cooperative Eyes: extraocular movements intact, anicteric, conjunctiva pink Mouth: oral pharynx moist, no lesions Neck: supple no lymphadenopathy Cardiovascular: heart regular rate and rhythm, no murmur Lungs: clear to auscultation bilaterally Abdomen: soft, nontender, nondistended, with probable ascites, no peritoneal signs, normal bowel sounds, no organomegaly. Umbilical hernia not protruding Rectal: Ommitted Extremities: no clubbing  or cyanosis; 1+ lower extremity edema on the left Skin: no lesions on visible extremities Neuro: No focal deficits. No asterixis.   ASSESSMENT:  #1. NASH cirrhosis with portal hypertension. May have ascites. Otherwise clinically compensated. Current MELD score 13 #2. Esophageal varices. On nadolol. #3. Status post Roux-en-Y gastric bypass surgery 2005 #4. History of multiple adenomatous colon polyps. Last colonoscopy 2015 #5. Multiple medical problems   PLAN:  #1. Avoid  added salt #2. Schedule abdominal ultrasound to evaluate the hepatic parenchyma as well as rule out ascites #3. If ascites present will prescribe Lasix 20 mg daily and Aldactone 50 mg daily #4. Routine office follow-up 6 months  30 minutes were spent with the patient and his wife today. Multiple questions answered. Greater than 50% of the time was spent counseling regarding his liver disease and its complication management and surveillance. Discussed prognosis and issues with transplantation given his comorbidities and surgical issues

## 2015-08-28 ENCOUNTER — Ambulatory Visit (HOSPITAL_COMMUNITY)
Admission: RE | Admit: 2015-08-28 | Discharge: 2015-08-28 | Disposition: A | Discharge status: Home / Self Care | Payer: Medicare Other | Source: Ambulatory Visit | Attending: Internal Medicine | Admitting: Internal Medicine

## 2015-08-28 DIAGNOSIS — R188 Other ascites: Secondary | ICD-10-CM | POA: Diagnosis not present

## 2015-08-28 DIAGNOSIS — K7460 Unspecified cirrhosis of liver: Secondary | ICD-10-CM | POA: Insufficient documentation

## 2015-08-28 DIAGNOSIS — K7581 Nonalcoholic steatohepatitis (NASH): Secondary | ICD-10-CM

## 2015-08-28 DIAGNOSIS — R161 Splenomegaly, not elsewhere classified: Secondary | ICD-10-CM | POA: Insufficient documentation

## 2015-09-25 ENCOUNTER — Other Ambulatory Visit: Payer: Self-pay | Admitting: Internal Medicine

## 2015-10-11 ENCOUNTER — Ambulatory Visit: Payer: Medicare Other | Admitting: Physician Assistant

## 2015-10-11 ENCOUNTER — Telehealth: Payer: Self-pay | Admitting: Internal Medicine

## 2015-10-11 NOTE — Telephone Encounter (Signed)
Offered pt appt today with PA at 10:15am but pt states he is there by himself and cannot come. Pt scheduled to see Cecille Rubin Hvozdovic, PA-C tomorrow at 2:15pm. Pt aware of appt.

## 2015-10-12 ENCOUNTER — Ambulatory Visit (INDEPENDENT_AMBULATORY_CARE_PROVIDER_SITE_OTHER): Payer: PPO | Admitting: Physician Assistant

## 2015-10-12 ENCOUNTER — Encounter: Payer: Self-pay | Admitting: Physician Assistant

## 2015-10-12 ENCOUNTER — Other Ambulatory Visit (INDEPENDENT_AMBULATORY_CARE_PROVIDER_SITE_OTHER): Payer: PPO

## 2015-10-12 VITALS — BP 122/70 | HR 72 | Ht 71.0 in | Wt 232.8 lb

## 2015-10-12 DIAGNOSIS — K7460 Unspecified cirrhosis of liver: Secondary | ICD-10-CM

## 2015-10-12 DIAGNOSIS — R188 Other ascites: Secondary | ICD-10-CM | POA: Diagnosis not present

## 2015-10-12 DIAGNOSIS — K7581 Nonalcoholic steatohepatitis (NASH): Secondary | ICD-10-CM

## 2015-10-12 LAB — COMPREHENSIVE METABOLIC PANEL
ALT: 32 U/L (ref 0–53)
AST: 45 U/L — ABNORMAL HIGH (ref 0–37)
Albumin: 3.7 g/dL (ref 3.5–5.2)
Alkaline Phosphatase: 180 U/L — ABNORMAL HIGH (ref 39–117)
BUN: 14 mg/dL (ref 6–23)
CALCIUM: 9.2 mg/dL (ref 8.4–10.5)
CO2: 28 meq/L (ref 19–32)
Chloride: 113 mEq/L — ABNORMAL HIGH (ref 96–112)
Creatinine, Ser: 1.03 mg/dL (ref 0.40–1.50)
GFR: 76.15 mL/min (ref >60.00)
Glucose, Bld: 120 mg/dL — ABNORMAL HIGH (ref 70–99)
POTASSIUM: 5.7 meq/L — AB (ref 3.5–5.1)
Sodium: 144 mEq/L (ref 135–145)
Total Bilirubin: 1.5 mg/dL — ABNORMAL HIGH (ref 0.2–1.2)
Total Protein: 5.9 g/dL — ABNORMAL LOW (ref 6.0–8.3)

## 2015-10-12 LAB — CBC WITH DIFFERENTIAL/PLATELET
BASOS ABS: 0 10*3/uL (ref 0.0–0.1)
BASOS PCT: 0.5 % (ref 0.0–3.0)
EOS ABS: 0.1 10*3/uL (ref 0.0–0.7)
Eosinophils Relative: 4 % (ref 0.0–5.0)
HEMATOCRIT: 38.4 % — AB (ref 39.0–52.0)
HEMOGLOBIN: 13 g/dL (ref 13.0–17.0)
Lymphocytes Relative: 10.9 % — ABNORMAL LOW (ref 12.0–46.0)
Lymphs Abs: 0.4 10*3/uL — ABNORMAL LOW (ref 0.7–4.0)
MCHC: 33.8 g/dL (ref 30.0–36.0)
MCV: 92.3 fl (ref 78.0–100.0)
Monocytes Absolute: 0.3 10*3/uL (ref 0.1–1.0)
Monocytes Relative: 7.6 % (ref 3.0–12.0)
Neutro Abs: 2.7 10*3/uL (ref 1.4–7.7)
Neutrophils Relative %: 77 % (ref 43.0–77.0)
Platelets: 57 10*3/uL — ABNORMAL LOW (ref 150.0–400.0)
RBC: 4.16 Mil/uL — ABNORMAL LOW (ref 4.22–5.81)
RDW: 14.4 % (ref 11.5–15.5)
WBC: 3.5 10*3/uL — ABNORMAL LOW (ref 4.0–10.5)

## 2015-10-12 LAB — PROTIME-INR
INR: 1.3 ratio — AB (ref 0.8–1.0)
PROTHROMBIN TIME: 13.7 s — AB (ref 9.6–13.1)

## 2015-10-12 LAB — BASIC METABOLIC PANEL
BUN: 14 mg/dL (ref 6–23)
CALCIUM: 9.2 mg/dL (ref 8.4–10.5)
CO2: 28 meq/L (ref 19–32)
CREATININE: 1.03 mg/dL (ref 0.40–1.50)
Chloride: 113 mEq/L — ABNORMAL HIGH (ref 96–112)
GFR: 76.15 mL/min (ref >60.00)
GLUCOSE: 120 mg/dL — AB (ref 70–99)
Potassium: 5.7 mEq/L — ABNORMAL HIGH (ref 3.5–5.1)
SODIUM: 144 meq/L (ref 135–145)

## 2015-10-12 LAB — AMMONIA: Ammonia: 78 umol/L — ABNORMAL HIGH (ref 11–35)

## 2015-10-12 MED ORDER — SPIRONOLACTONE 25 MG PO TABS: 25.0000 mg | ORAL_TABLET | Freq: Every day | ORAL | Status: DC

## 2015-10-12 MED ORDER — FUROSEMIDE 20 MG PO TABS: 20.0000 mg | ORAL_TABLET | Freq: Every day | ORAL | Status: DC

## 2015-10-12 NOTE — Patient Instructions (Signed)
You have been scheduled for a paracentesis at [facility] for Jan 13 th 2017 at 11 am.Please arrive 15 min early. Please go to [facility]. [phone]  Continue the Nadolol as directed.  STOP using the Maxide.  We have sent the following medications to your pharmacy for you to pick up at your convenience: Lasix, Spironolactone and Flagyl.  Start a low sodium diet.  Your physician/provider has requested that you go to the basement for lab work before leaving today.  [facility] [place] [facility] will contact you for an appointment.

## 2015-10-12 NOTE — Progress Notes (Signed)
Agree with A & P. 

## 2015-10-12 NOTE — Progress Notes (Signed)
Patient ID: [name] House, male   DOB: 1945/11/16, 70 y.o.   MRN: [id number]     History of Present Illness: [name] House  Is a pleasant 70 year old gentleman who is followed by Dr. Scarlette Shorts. He is followed for [name] House cirrhosis with portal hypertension. He was last evaluated for this in November at which time he was instructed to avoid added salt and was scheduled for an abdominal ultrasound. Ultrasound was performed on 08/28/2015 and revealed probable changes of cirrhosis with somewhat echogenic liver parenchyma and nodular hepatic contour is no focal hepatic abnormality is seen. Small amount of. Hepatic ascites. Significant splenomegaly. The pancreas and abdominal aorta are largely obscured by bowel gas. Probable nonobstructing right renal calculi. Since his last visit he admits that he has had numerous dietary indiscretions from Thanksgiving through Christmas. His weight on November 17 was 218 pounds. 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 By the end of this shift he noted swelling of his lower extremities and groin as well as his abdomen. He was seen by urology and states he had a scrotal ultrasound that was nonrevealing and he was advised to follow-up with GI he saw his primary care provider's office several days ago and was started on Maxide which has resulted in some weight loss. He had gotten up to 238 pounds and is now 232 pounds on Maxide. His main complaint today is excessive abdominal swelling and tightness. He has had no fever , chills, or night sweats. His past medical history is also significant for an iron deficiency anemia which is felt to be secondary to his gastric bypass surgery. 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   Past Medical History  Diagnosis Date  . Diabetes mellitus, type 2 ([place])     resolved with gastric bypass  . Hyperlipemia   . Kidney stones   . Osteoarthritis of hip   .  Hypertension   . Benign neoplasm of colon   . Diverticulosis of colon (without mention of hemorrhage)   . Iron deficiency anemia secondary to blood loss (chronic)   . Unspecified sleep apnea     Resolved  . Cirrhosis ([place])   . Liver cyst   . Renal cyst   . Esophageal varices ([place])   . Abdominal hernia     Past Surgical History  Procedure Laterality Date  . Knee surgery      Bil  . Ankle surgery      Bil  . Gastric bypass  2005  . Total hip arthroplasty  2008    right  . Colonoscopy  2012  . Polypectomy     Family History  Problem Relation Age of Onset  . Heart attack Father     deceased due to MI  . Heart disease Father   . Diabetes Mother     deceased due to complications of DM  . Coronary artery disease Brother   . Colon cancer Neg Hx   . Stomach cancer Neg Hx    Social History  Substance Use Topics  . Smoking status: Never Smoker   . Smokeless tobacco: Never Used  . Alcohol Use: 0.0 oz/week    2-3 Glasses of wine per week     Comment: week   Current Outpatient Prescriptions  Medication Sig Dispense Refill  . diphenoxylate-atropine (LOMOTIL) 2.5-0.025 MG per tablet Take 1 tablet by mouth as directed.     Marland Kitchen  lisinopril (PRINIVIL,ZESTRIL) 20 MG tablet Take 20 mg by mouth daily.    . nadolol (CORGARD) 20 MG tablet TAKE 1 TABLET BY MOUTH DAILY 90 tablet 1  . triamterene-hydrochlorothiazide (MAXZIDE-25) 37.5-25 MG tablet Take 1 tablet by mouth daily.    . vitamin A 7500 UNIT capsule Take 7,500 Units by mouth daily.    . vitamin E 1000 UNIT capsule Take 1,000 Units by mouth daily.    . furosemide (LASIX) 20 MG tablet Take 1 tablet (20 mg total) by mouth daily. 30 tablet 2  . spironolactone (ALDACTONE) 25 MG tablet Take 1 tablet (25 mg total) by mouth daily. 30 tablet 2   No current facility-administered medications for this visit.   Allergies  Allergen Reactions  . Oxycodone Other (See Comments)    Caused him to fall..really sedated     Review of  Systems: Gen: Denies any fever, chills, sweats, anorexia, fatigue, weakness, malaise, weight loss, and sleep disorder CV: Denies chest pain, angina, palpitations, syncope, orthopnea, PND, peripheral edema, and claudication. Resp: Denies dyspnea at rest, dyspnea with exercise, cough, sputum, wheezing, coughing up blood, and pleurisy. GI: Denies vomiting blood, jaundice, and fecal incontinence.   Denies dysphagia or odynophagia. GU : Denies urinary burning, blood in urine, urinary frequency, urinary hesitancy, nocturnal urination, and urinary incontinence. MS: Denies joint pain, limitation of movement, and swelling, stiffness, low back pain, extremity pain. Denies muscle weakness, cramps, atrophy.  Derm: Denies rash, itching, dry skin, hives, moles, warts, or unhealing ulcers.  Psych: Denies depression, anxiety, memory loss, suicidal ideation, hallucinations, paranoia, and confusion. Heme: Denies bruising, bleeding, and enlarged lymph nodes. Neuro:  Denies any headaches, dizziness, paresthesia Endo:  Denies any problems with DM, thyroid, adrenal   Studies:  Abdomen Complete   Status: Final result       PACS Images     Show images for US Abdomen Complete     Study Result     CLINICAL DATA: History of cirrhosis evaluate for hepatic lesion or presence of ascites  EXAM: ULTRASOUND ABDOMEN COMPLETE  COMPARISON: Limited ultrasound of the abdomen of 07/23/2013 and CT abdomen pelvis of 07/02/2013  FINDINGS: Gallbladder: The gallbladder has previously been resected.  Common bile duct: Diameter: The common bile duct is normal measuring 2.8 mm in diameter.  Liver: The liver somewhat echogenic. Also there is a slight nodularity to the margins of the liver suggesting mild changes of cirrhosis. No focal intrahepatic lesion is seen. There is ascites surrounding the liver. A cyst emanates from the posterior aspect of the liver measuring 4.3 cm, unchanged compared to the CT from  2014.  IVC: No abnormality visualized.  Pancreas: The pancreas is largely obscured by bowel gas.  Spleen: The spleen is enlarged measuring 23.1 cm with a volume of 26 63 cubic cm.  Right Kidney: Length: 12.3 cm. No hydronephrosis is seen. Echogenic foci are noted consistent with nonobstructing renal calculi. Right renal cysts are again noted with no complicating features. The largest right renal cyst measures 6.8 cm in maximum diameter.  Left Kidney: Length: 13.3 cm. There is a small rounded hypoechoic structure near the renal hilum which may represent a parapelvic cyst.  Abdominal aorta: The abdominal aorta is not well seen but no aneurysm dilatation is noted.  Other findings: The left lobe of liver is largely obscured by bowel gas.  IMPRESSION: 1. Probable changes of cirrhosis with somewhat echogenic liver parenchyma and nodular hepatic contours. No focal hepatic abnormality is seen. 2. Small amount of perihepatic ascites.  3. Significant splenomegaly. 4. The pancreas and abdominal aorta are largely obscured by bowel gas. 5. Probable nonobstructing right renal calculi.   Electronically Signed  By: Ivar Drape M.D.  On: 08/28/2015 10:19       Physical Exam: BP 122/70 mmHg  Pulse 72  Ht 5\' 11"  (1.803 m)  Wt 232 lb 12.8 oz (105.597 kg)  BMI 32.48 kg/m2 General: Pleasant, well developed , Caucasianmale in no acute distress Head: Normocephalic and atraumatic Eyes:  sclerae anicteric, conjunctiva pink  Ears: Normal auditory acuity Lungs: Clear throughout to auscultation Heart: Regular rate and rhythm Abdomen: Soft,  Distended, taut with probable ascites, mild diffuse tenderness to palpation with no rebound or guarding. Umbilical hernia protruding. Musculoskeletal: Symmetrical with no gross deformities  Extremities:  2+ lower extremity edema Neurological: Alert oriented x 4, grossly nonfocal.  No asterixis. Psychological:  Alert and cooperative. Normal  mood and affect  Assessment and Recommendations:  70 year old male with [name] House cirrhosis and portal hypertension presenting with abdominal distention, likely due to ascites. He has gained 20 pounds since his last visit. A CBC, comprehensive metabolic panel, ammonia, and PT/INR will be obtained today. He will be scheduled for an ultrasound guided paracentesis with fluids to be sent for culture, cell count, and cytology. He will continue his current dose of Natalie along. He will discontinue his Maxide and start Lasix 20 mg by mouth daily and spironolactone 25 mg daily. He will be scheduled for a dietary consult to educate him about a low salt diet and other  Dietary recommendations for cirrhosis. He will have a repeat basic metabolic panel drawn in one week. He will follow up in 3 weeks, sooner if needed.        Cleola Perryman, Vita Barley PA-C 10/12/2015,

## 2015-10-13 ENCOUNTER — Other Ambulatory Visit: Payer: Self-pay

## 2015-10-13 MED ORDER — METRONIDAZOLE 250 MG PO TABS: 250.0000 mg | ORAL_TABLET | Freq: Three times a day (TID) | ORAL | Status: DC

## 2015-10-16 ENCOUNTER — Other Ambulatory Visit: Payer: Self-pay

## 2015-10-16 DIAGNOSIS — E875 Hyperkalemia: Secondary | ICD-10-CM

## 2015-10-17 ENCOUNTER — Encounter: Payer: Self-pay | Admitting: *Deleted

## 2015-10-17 ENCOUNTER — Other Ambulatory Visit (INDEPENDENT_AMBULATORY_CARE_PROVIDER_SITE_OTHER): Payer: PPO

## 2015-10-17 DIAGNOSIS — E875 Hyperkalemia: Secondary | ICD-10-CM

## 2015-10-17 LAB — POTASSIUM: POTASSIUM: 4.3 meq/L (ref 3.5–5.1)

## 2015-10-20 ENCOUNTER — Ambulatory Visit (HOSPITAL_COMMUNITY)
Admission: RE | Admit: 2015-10-20 | Discharge: 2015-10-20 | Disposition: A | Discharge status: Home / Self Care | Payer: PPO | Source: Ambulatory Visit | Attending: Physician Assistant | Admitting: Physician Assistant

## 2015-10-20 ENCOUNTER — Encounter (HOSPITAL_COMMUNITY)
Admission: RE | Admit: 2015-10-20 | Discharge: 2015-10-20 | Disposition: A | Discharge status: Home / Self Care | Payer: PPO | Source: Ambulatory Visit | Attending: Physician Assistant | Admitting: Physician Assistant

## 2015-10-20 DIAGNOSIS — R188 Other ascites: Secondary | ICD-10-CM | POA: Diagnosis not present

## 2015-10-20 DIAGNOSIS — Z029 Encounter for administrative examinations, unspecified: Secondary | ICD-10-CM | POA: Insufficient documentation

## 2015-10-20 DIAGNOSIS — K7581 Nonalcoholic steatohepatitis (NASH): Secondary | ICD-10-CM | POA: Insufficient documentation

## 2015-10-20 DIAGNOSIS — K7460 Unspecified cirrhosis of liver: Secondary | ICD-10-CM | POA: Insufficient documentation

## 2015-10-20 LAB — GRAM STAIN

## 2015-10-20 LAB — BODY FLUID CELL COUNT WITH DIFFERENTIAL
Lymphs, Fluid: 17 %
Monocyte-Macrophage-Serous Fluid: 77 % (ref 50–90)
Neutrophil Count, Fluid: 6 % (ref 0–25)
Total Nucleated Cell Count, Fluid: 200 uL (ref 0–1000)

## 2015-10-20 MED ORDER — ALBUMIN HUMAN 25 % IV SOLN
20.0000 g | Freq: Once | INTRAVENOUS | Status: AC
  Administered 2015-10-20: 20 g via INTRAVENOUS
  Filled 2015-10-20: qty 100

## 2015-10-20 NOTE — Procedures (Signed)
Ultrasound-guided diagnostic and therapeutic paracentesis performed yielding 2 liters of slightly hazy, light yellow colored fluid. No immediate complications. A portion of the fluid was sent to the lab for preordered studies. No immediate complications. The pt will receive IV albumin postprocedure.

## 2015-10-20 NOTE — Discharge Instructions (Signed)
IF YOU ARE GOING TO BE 15 OR MINUTES LATE FOR YOUR APPOINTMENT, PLEASE CALL [phone] TO MAKE OTHER ARRANGEMENTS FOR YOUR TREATMENT  IF YOU ARRIVE EARLY FOR YOUR SCHEDULED APPOINTMENT , YOU MAY HAVE TO WAIT UNTIL YOUR SCHEDULED TIME.Albumin injection What is this medicine? ALBUMIN (al BYOO min) is used to treat or prevent shock following serious injury, bleeding, surgery, or burns by increasing the volume of blood plasma. This medicine can also replace low blood protein. This medicine may be used for other purposes; ask your health care provider or pharmacist if you have questions. What should I tell my health care provider before I take this medicine? They need to know if you have any of the following conditions: -anemia -heart disease -kidney disease -an unusual or allergic reaction to albumin, other medicines, foods, dyes, or preservatives -pregnant or trying to get pregnant -breast-feeding How should I use this medicine? This medicine is for infusion into a vein. It is given by a health-care professional in a hospital or clinic. Talk to your pediatrician regarding the use of this medicine in children. While this drug may be prescribed for selected conditions, precautions do apply. Overdosage: If you think you have taken too much of this medicine contact a poison control center or emergency room at once. NOTE: This medicine is only for you. Do not share this medicine with others. What if I miss a dose? This does not apply. What may interact with this medicine? Interactions are not expected. This list may not describe all possible interactions. Give your health care provider a list of all the medicines, herbs, non-prescription drugs, or dietary supplements you use. Also tell them if you smoke, drink alcohol, or use illegal drugs. Some items may interact with your medicine. What should I watch for while using this medicine? Your condition will be closely monitored while you receive this  medicine. Some products are derived from human plasma, and there is a small risk that these products may contain certain types of virus or bacteria. All products are processed to kill most viruses and bacteria. If you have questions concerning the risk of infections, discuss them with your doctor or health care professional. What side effects may I notice from receiving this medicine? Side effects that you should report to your doctor or health care professional as soon as possible: -allergic reactions like skin rash, itching or hives, swelling of the face, lips, or tongue -breathing problems -changes in heartbeat -fever, chills -pain, redness or swelling at the injection site -signs of viral infection including fever, drowsiness, chills, runny nose followed in about 2 weeks by a rash and joint pain -tightness in the chest Side effects that usually do not require medical attention (report to your doctor or health care professional if they continue or are bothersome): -increased salivation -nausea, vomiting This list may not describe all possible side effects. Call your doctor for medical advice about side effects. You may report side effects to FDA at 1-800-FDA-1088. Where should I keep my medicine? This does not apply. You will not be given this medicine to store at home. NOTE: This sheet is a summary. It may not cover all possible information. If you have questions about this medicine, talk to your doctor, pharmacist, or health care provider.    2016, Elsevier/Gold Standard. (2007-12-17 10:18:55)

## 2015-10-25 LAB — CULTURE, BODY FLUID W GRAM STAIN -BOTTLE

## 2015-10-25 LAB — CULTURE, BODY FLUID-BOTTLE: CULTURE: NO GROWTH

## 2015-11-03 ENCOUNTER — Other Ambulatory Visit (INDEPENDENT_AMBULATORY_CARE_PROVIDER_SITE_OTHER): Payer: PPO

## 2015-11-03 ENCOUNTER — Ambulatory Visit (INDEPENDENT_AMBULATORY_CARE_PROVIDER_SITE_OTHER): Payer: PPO | Admitting: Internal Medicine

## 2015-11-03 ENCOUNTER — Encounter: Payer: Self-pay | Admitting: Internal Medicine

## 2015-11-03 VITALS — BP 134/62 | HR 72 | Ht 71.0 in | Wt 227.0 lb

## 2015-11-03 DIAGNOSIS — K439 Ventral hernia without obstruction or gangrene: Secondary | ICD-10-CM

## 2015-11-03 DIAGNOSIS — K7460 Unspecified cirrhosis of liver: Secondary | ICD-10-CM | POA: Diagnosis not present

## 2015-11-03 DIAGNOSIS — I8500 Esophageal varices without bleeding: Secondary | ICD-10-CM | POA: Diagnosis not present

## 2015-11-03 DIAGNOSIS — R188 Other ascites: Secondary | ICD-10-CM | POA: Diagnosis not present

## 2015-11-03 LAB — BASIC METABOLIC PANEL
BUN: 18 mg/dL (ref 6–23)
CALCIUM: 8.7 mg/dL (ref 8.4–10.5)
CHLORIDE: 113 meq/L — AB (ref 96–112)
CO2: 25 meq/L (ref 19–32)
CREATININE: 1.26 mg/dL (ref 0.40–1.50)
GFR: 60.34 mL/min (ref >60.00)
GLUCOSE: 165 mg/dL — AB (ref 70–99)
POTASSIUM: 4.2 meq/L (ref 3.5–5.1)
Sodium: 141 mEq/L (ref 135–145)

## 2015-11-03 MED ORDER — SPIRONOLACTONE 100 MG PO TABS: 100.0000 mg | ORAL_TABLET | Freq: Every day | ORAL | Status: DC

## 2015-11-03 MED ORDER — FUROSEMIDE 40 MG PO TABS: 40.0000 mg | ORAL_TABLET | Freq: Every day | ORAL | Status: DC

## 2015-11-03 NOTE — Progress Notes (Signed)
HISTORY OF PRESENT ILLNESS:  [name] House is a 70 y.o. male with multiple medical problems who is status post Roux-en-Y gastric bypass surgery in 2005. He is followed in this office for NASH cirrhosis with portal hypertension. He also has a history of esophageal varices for which he is on nadolol. Other problems include history of adenomatous colon polyps and umbilical hernia. I last saw the patient 08/24/2015. MELD score calculated at 13. The possibility of abdominal ascites led to abdominal ultrasound which did not reveal ascites. Prescribed antibiotics were not started. He was seen most recently by the GI physician assistant 10/12/2015 with anasarca including abdominal distention, scrotal edema, and ankle edema. He also complained of bloating for which metronidazole was prescribed but not completed. He started low-dose furosemide at 20 mg and Aldactone at 25 mg until his paracentesis which was performed 10/20/2015. 2 L removed. No SBP. He discontinued antibiotics. He presents today for scheduled follow-up. He reports abdominal distention, though not as severe. Less problems with scrotal edema and ankle edema. He is wearing support hose. Previously told to stop Maxide. Not certain if he is taking. Had some hyperkalemia which resolved spontaneously. Last CT scan of the abdomen 2014 demonstrated postoperative changes and large umbilical hernia. Patient states that his hernia is uncomfortable any questions if surgical repair is indicated. No obvious problems with encephalopathy. No evidence for GI bleeding. 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 He is contemplated retirement  REVIEW OF SYSTEMS:  All non-GI ROS negative except for arthritis  Past Medical History  Diagnosis Date  . Diabetes mellitus, type 2 ([place])     resolved with gastric bypass  . Hyperlipemia   . Kidney stones   . Osteoarthritis of hip   . Hypertension   . Benign neoplasm of colon   .  Diverticulosis of colon (without mention of hemorrhage)   . Iron deficiency anemia secondary to blood loss (chronic)   . Unspecified sleep apnea     Resolved  . Cirrhosis ([place])   . Liver cyst   . Renal cyst   . Esophageal varices ([place])   . Abdominal hernia   . Ascites   . NASH (nonalcoholic steatohepatitis)   . Splenomegaly     Past Surgical History  Procedure Laterality Date  . Knee surgery      Bil  . Ankle surgery      Bil  . Gastric bypass  2005  . Total hip arthroplasty  2008    right  . Colonoscopy  2012  . Polypectomy      Social History [name] House  reports that he has never smoked. He has never used smokeless tobacco. He reports that he drinks alcohol. He reports that he does not use illicit drugs.  family history includes Coronary artery disease in his brother; Diabetes in his mother; Heart attack in his father; Heart disease in his father. There is no history of Colon cancer or Stomach cancer.  Allergies  Allergen Reactions  . Oxycodone Other (See Comments)    Caused him to fall..really sedated       PHYSICAL EXAMINATION: Vital signs: BP 134/62 mmHg  Pulse 72  Ht 5\' 11"  (1.803 m)  Wt 227 lb (102.967 kg)  BMI 31.67 kg/m2 (218 in November, 232 3 weeks ago) Constitutional: Pleasant, chronically ill-appearing, no acute distress Head: Bilateral temporal wasting Psychiatric: alert and oriented x3, cooperative Eyes: extraocular movements intact, anicteric, conjunctiva pink Mouth: oral pharynx moist, no lesions Neck:  supple without thyromegaly Lymph no lymphadenopathy Cardiovascular: heart regular rate and rhythm, no murmur Lungs: clear to auscultation bilaterally Abdomen: soft, nontender, distended with probable ascites, no peritoneal signs, normal bowel sounds, no organomegaly. Obvious umbilical hernia which is not tender Rectal: Omitted Extremities: no clubbing or cyanosis. Trace lower extremity edema bilaterally with support hose on Skin: no lesions  on visible extremities Neuro: No focal deficits. No asterixis.  ASSESSMENT:  #1. [place] cirrhosis with portal hypertension #2. Abdominal ascites #3. Esophageal varices. On nadolol for primary prophylaxis #4. Umbilical hernia. Uncomfortable. Needs reevaluated radiologically #5. Medication clarification #6. Status post Roux-en-Y gastric bypass surgery 2005 #7. History of adenomatous colon polyps. Surveillance up-to-date. Last colonoscopy October 2015   PLAN:  #1. Initiate furosemide 40 mg daily #2. Initiate Aldactone 100 mg daily #3. Basic metabolic panel today. Follow-up blood work to be determined #4. Contrast-enhanced CT scan of the abdomen and pelvis to reevaluate abdominal hernia status #5. Daily weights #6. Stop Maxide #7. Schedule ultrasound guided paracentesis with albumin replacement. Send fluid for cell count with differential #8. Office follow-up in 4 weeks. Contact the office in the interim for questions or problems  40 minutes was spent face-to-face with the patient with greater than half the time use for counseling regarding his liver disease and its complications. We discussed treatment plan as outlined. As well follow-up plans.

## 2015-11-03 NOTE — Patient Instructions (Addendum)
Your physician has requested that you go to the basement for the following lab work before leaving today:bmet  We have sent the following medications to your pharmacy for you to pick up at your convenience:   Aldactone, Lasix  DO NOT [place] ANYMORE, PER DR. PERRY   You have been scheduled for a CT scan of the abdomen and pelvis at [facility] ([address]---this is in the same building as Press [facility]).   You are scheduled on 11/07/2015 at 9:00am. You should arrive 15 minutes prior to your appointment time for registration. Please follow the written instructions below on the day of your exam:  WARNING: IF YOU ARE ALLERGIC TO IODINE/X-RAY DYE, PLEASE NOTIFY RADIOLOGY IMMEDIATELY AT [id number]! YOU WILL BE GIVEN A 13 HOUR PREMEDICATION PREP.  1) Do not eat or drink anything after 5:00am (4 hours prior to your test) 2) You have been given 2 bottles of oral contrast to drink. The solution may taste better if refrigerated, but do NOT add ice or any other liquid to this solution. Shake well before drinking.    Drink 1 bottle of contrast @ 7:00am (2 hours prior to your exam)  Drink 1 bottle of contrast @ 8:00am (1 hour prior to your exam)  You may take any medications as prescribed with a small amount of water except for the following: Metformin, Glucophage, Glucovance, Avandamet, Riomet, Fortamet, Actoplus Met, Janumet, Glumetza or Metaglip. The above medications must be held the day of the exam AND 48 hours after the exam.  The purpose of you drinking the oral contrast is to aid in the visualization of your intestinal tract. The contrast solution may cause some diarrhea. Before your exam is started, you will be given a small amount of fluid to drink. Depending on your individual set of symptoms, you may also receive an intravenous injection of x-ray contrast/dye. Plan on being at [facility] for 30 minutes or long, depending on the type of exam you are having  performed.  If you have any questions regarding your exam or if you need to reschedule, you may call the CT department at [phone] between the hours of 8:00 am and 5:00 pm, Monday-Friday.  ________________________________________________________________________

## 2015-11-07 ENCOUNTER — Ambulatory Visit (INDEPENDENT_AMBULATORY_CARE_PROVIDER_SITE_OTHER)
Admission: RE | Admit: 2015-11-07 | Discharge: 2015-11-07 | Disposition: A | Discharge status: Home / Self Care | Payer: PPO | Source: Ambulatory Visit | Attending: Internal Medicine | Admitting: Internal Medicine

## 2015-11-07 DIAGNOSIS — K449 Diaphragmatic hernia without obstruction or gangrene: Secondary | ICD-10-CM | POA: Diagnosis not present

## 2015-11-07 DIAGNOSIS — K439 Ventral hernia without obstruction or gangrene: Secondary | ICD-10-CM

## 2015-11-07 MED ORDER — IOHEXOL 300 MG/ML  SOLN
100.0000 mL | Freq: Once | INTRAMUSCULAR | Status: AC | PRN
  Administered 2015-11-07: 100 mL via INTRAVENOUS

## 2015-12-11 ENCOUNTER — Encounter: Payer: Self-pay | Admitting: Internal Medicine

## 2015-12-11 ENCOUNTER — Other Ambulatory Visit (INDEPENDENT_AMBULATORY_CARE_PROVIDER_SITE_OTHER): Payer: PPO

## 2015-12-11 ENCOUNTER — Telehealth: Payer: Self-pay | Admitting: Internal Medicine

## 2015-12-11 ENCOUNTER — Ambulatory Visit (INDEPENDENT_AMBULATORY_CARE_PROVIDER_SITE_OTHER): Payer: PPO | Admitting: Internal Medicine

## 2015-12-11 VITALS — BP 108/56 | HR 72 | Ht 72.0 in | Wt 222.0 lb

## 2015-12-11 DIAGNOSIS — K7460 Unspecified cirrhosis of liver: Secondary | ICD-10-CM

## 2015-12-11 DIAGNOSIS — K439 Ventral hernia without obstruction or gangrene: Secondary | ICD-10-CM

## 2015-12-11 DIAGNOSIS — K7581 Nonalcoholic steatohepatitis (NASH): Secondary | ICD-10-CM | POA: Diagnosis not present

## 2015-12-11 DIAGNOSIS — I8500 Esophageal varices without bleeding: Secondary | ICD-10-CM

## 2015-12-11 DIAGNOSIS — R188 Other ascites: Secondary | ICD-10-CM

## 2015-12-11 LAB — BASIC METABOLIC PANEL
BUN: 18 mg/dL (ref 6–23)
CALCIUM: 8.6 mg/dL (ref 8.4–10.5)
CO2: 23 mEq/L (ref 19–32)
CREATININE: 1.11 mg/dL (ref 0.40–1.50)
Chloride: 112 mEq/L (ref 96–112)
GFR: 69.82 mL/min (ref >60.00)
GLUCOSE: 174 mg/dL — AB (ref 70–99)
Potassium: 4.3 mEq/L (ref 3.5–5.1)
Sodium: 141 mEq/L (ref 135–145)

## 2015-12-11 MED ORDER — FUROSEMIDE 40 MG PO TABS: 40.0000 mg | ORAL_TABLET | Freq: Every day | ORAL | Status: DC

## 2015-12-11 MED ORDER — SPIRONOLACTONE 100 MG PO TABS: 100.0000 mg | ORAL_TABLET | Freq: Every day | ORAL | Status: DC

## 2015-12-11 NOTE — Progress Notes (Signed)
HISTORY OF PRESENT ILLNESS:  [name] House is a 70 y.o. male with multiple medical problems who is status post Roux-en-Y gastric bypass surgery 2005. He is followed in this office for NASH cirrhosis with portal hypertension. He also has a history of esophageal varices for which she is on nadolol as primary prophylaxis. Other problems include symptomatic umbilical hernia and adenomatous colon polyps. Last MELD score 13. Last evaluated in the office 11/03/2015 for abdominal ascites and symptomatic umbilical hernia. He was initiated on furosemide and Aldactone with good diuresis. His weight dropped from 227-213. He ran out of diuretics last week and his weight has been creeping up some. Currently 222. His last basic metabolic panel was unremarkable. He did undergo contrast-enhanced CT scan of the abdomen and pelvis 11/07/2015. In addition to hepatic cirrhosis with portal hypertension increased size of complex periumbilical hernia noted. He has seen Dr. Erroll Luna in the past regarding this hernia. Last reviewed office encounter from 2014 in [place] recommended office follow-up. Patient tells me that referral to [facility] for consideration of repair had been made. 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 Diabetes mellitus, type 2 ([place])     resolved with gastric bypass  . Hyperlipemia   . Kidney stones   . Osteoarthritis of hip   . Hypertension   . Benign neoplasm of colon   . Diverticulosis of colon (without mention of hemorrhage)   . Iron deficiency anemia secondary to blood loss (chronic)   . Unspecified sleep apnea     Resolved  . Cirrhosis ([place])   . Liver cyst   . Renal cyst   . Esophageal varices ([place])   . Abdominal hernia   . Ascites   . NASH (nonalcoholic steatohepatitis)   . Splenomegaly     Past Surgical  History  Procedure Laterality Date  . Knee surgery      Bil  . Ankle surgery      Bil  . Gastric bypass  2005  . Total hip arthroplasty  2008    right  . Colonoscopy  2012  . Polypectomy      Social History HOLGER ARLINGHAUS  reports that he has never smoked. He has never used smokeless tobacco. He reports that he does not drink alcohol or use illicit drugs.  family history includes Coronary artery disease in his brother; Diabetes in his mother; Heart attack in his father; Heart disease in his father. There is no history of Colon cancer or Stomach cancer.  Allergies  Allergen Reactions  . Oxycodone Other (See Comments)    Caused him to fall..really sedated       PHYSICAL EXAMINATION: Vital signs: BP 108/56 mmHg  Pulse 72  Ht 6' (1.829 m)  Wt 222 lb (100.699 kg)  BMI 30.10 kg/m2  Constitutional: Pleasant, chronically illl-appearing, no acute distress Psychiatric: alert and oriented x3, cooperative Eyes: extraocular movements intact, anicteric, conjunctiva pink Mouth: oral pharynx moist, no lesions Neck: supple no lymphadenopathy Cardiovascular: heart regular rate and rhythm, no murmur Lungs: clear to auscultation bilaterally Abdomen: soft, obese, nontender, nondistended, no obvious ascites, no peritoneal signs, normal bowel sounds, no organomegaly. Large ventral hernia Extremities: no clubbing or cyanosis. One plus bilateral lower extremity edema bilaterally Skin: no lesions on visible extremities Neuro: No focal deficits. No asterixis.  ASSESSMENT:  #1. NASH cirrhosis with portal hypertension #2. Abdominal ascites responding to diuretics. Has began to reaccumulate fluid off diuretics #3. Esophageal varices. On nadolol for primary prophylaxis #4. Large umbilical hernia. Uncomfortable. Has had previous surgical evaluation #5. Status post Roux-en-Y gastric bypass surgery 2005 #6. History of adenomatous colon polyps. Surveillance up-to-date. Last colonoscopy  2015   PLAN:  #1. Low sodium diet #2. Refill furosemide 40 mg daily and Aldactone 100 mg daily, and resume #3. Daily weights to be monitored as recorded #4. Basic metabolic panel today #5. Surgical reevaluation with Dr. Brantley Stage #6. Routine GI office follow-up 3 months. Sooner if needed

## 2015-12-11 NOTE — Patient Instructions (Addendum)
Your physician has requested that you go to the basement for the following lab work before leaving today:  Bmet   We have sent the following medications to your pharmacy for you to pick up at your convenience:  Aldactone, Lasix  You have been scheduled for an appointment with Dr. Brantley Stage tomorrow, 12/12/2015 at 4:00pm.  If you need to reschedule the phone number is: [phone]

## 2015-12-11 NOTE — Telephone Encounter (Signed)
See result note.  

## 2015-12-12 ENCOUNTER — Other Ambulatory Visit: Payer: Self-pay

## 2015-12-12 ENCOUNTER — Ambulatory Visit: Payer: Self-pay | Admitting: Surgery

## 2015-12-12 DIAGNOSIS — K429 Umbilical hernia without obstruction or gangrene: Secondary | ICD-10-CM | POA: Diagnosis not present

## 2015-12-12 DIAGNOSIS — K7460 Unspecified cirrhosis of liver: Secondary | ICD-10-CM

## 2015-12-12 DIAGNOSIS — K7581 Nonalcoholic steatohepatitis (NASH): Principal | ICD-10-CM

## 2016-01-08 ENCOUNTER — Telehealth: Payer: Self-pay

## 2016-01-08 NOTE — Telephone Encounter (Signed)
-----   Message from Algernon Huxley, RN sent at 12/12/2015  2:42 PM EST ----- Regarding: bmet Pt needs bmet in 4 weeks, order in epic

## 2016-01-08 NOTE — Telephone Encounter (Signed)
Left message for pt to call back  °

## 2016-01-09 NOTE — Telephone Encounter (Signed)
Pt aware.

## 2016-01-11 ENCOUNTER — Other Ambulatory Visit (INDEPENDENT_AMBULATORY_CARE_PROVIDER_SITE_OTHER): Payer: PPO

## 2016-01-11 DIAGNOSIS — K7581 Nonalcoholic steatohepatitis (NASH): Secondary | ICD-10-CM | POA: Diagnosis not present

## 2016-01-11 DIAGNOSIS — K7460 Unspecified cirrhosis of liver: Secondary | ICD-10-CM

## 2016-01-11 LAB — BASIC METABOLIC PANEL
BUN: 21 mg/dL (ref 6–23)
CALCIUM: 9 mg/dL (ref 8.4–10.5)
CHLORIDE: 110 meq/L (ref 96–112)
CO2: 25 mEq/L (ref 19–32)
CREATININE: 1.31 mg/dL (ref 0.40–1.50)
GFR: 57.66 mL/min — AB (ref >60.00)
Glucose, Bld: 200 mg/dL — ABNORMAL HIGH (ref 70–99)
Potassium: 4.7 mEq/L (ref 3.5–5.1)
Sodium: 141 mEq/L (ref 135–145)

## 2016-01-15 ENCOUNTER — Other Ambulatory Visit: Payer: Self-pay

## 2016-01-15 DIAGNOSIS — K7581 Nonalcoholic steatohepatitis (NASH): Secondary | ICD-10-CM

## 2016-01-26 ENCOUNTER — Telehealth: Payer: Self-pay | Admitting: Oncology

## 2016-01-26 ENCOUNTER — Other Ambulatory Visit (HOSPITAL_BASED_OUTPATIENT_CLINIC_OR_DEPARTMENT_OTHER): Payer: PPO

## 2016-01-26 ENCOUNTER — Ambulatory Visit (HOSPITAL_BASED_OUTPATIENT_CLINIC_OR_DEPARTMENT_OTHER): Payer: PPO | Admitting: Oncology

## 2016-01-26 VITALS — BP 117/50 | HR 68 | Temp 98.4°F | Resp 18 | Ht 72.0 in | Wt 238.1 lb

## 2016-01-26 DIAGNOSIS — D6959 Other secondary thrombocytopenia: Secondary | ICD-10-CM

## 2016-01-26 DIAGNOSIS — K7581 Nonalcoholic steatohepatitis (NASH): Secondary | ICD-10-CM

## 2016-01-26 DIAGNOSIS — D509 Iron deficiency anemia, unspecified: Secondary | ICD-10-CM | POA: Diagnosis not present

## 2016-01-26 DIAGNOSIS — D731 Hypersplenism: Secondary | ICD-10-CM

## 2016-01-26 DIAGNOSIS — K766 Portal hypertension: Secondary | ICD-10-CM

## 2016-01-26 LAB — CBC WITH DIFFERENTIAL/PLATELET
BASO%: 0.3 % (ref 0.0–2.0)
Basophils Absolute: 0 10*3/uL (ref 0.0–0.1)
EOS%: 2.5 % (ref 0.0–7.0)
Eosinophils Absolute: 0.1 10*3/uL (ref 0.0–0.5)
HEMATOCRIT: 30.2 % — AB (ref 38.4–49.9)
HGB: 10.4 g/dL — ABNORMAL LOW (ref 13.0–17.1)
LYMPH#: 0.3 10*3/uL — AB (ref 0.9–3.3)
LYMPH%: 8.1 % — AB (ref 14.0–49.0)
MCH: 31.5 pg (ref 27.2–33.4)
MCHC: 34.3 g/dL (ref 32.0–36.0)
MCV: 91.6 fL (ref 79.3–98.0)
MONO#: 0.3 10*3/uL (ref 0.1–0.9)
MONO%: 7.8 % (ref 0.0–14.0)
NEUT%: 81.3 % — ABNORMAL HIGH (ref 39.0–75.0)
NEUTROS ABS: 3.2 10*3/uL (ref 1.5–6.5)
PLATELETS: 62 10*3/uL — AB (ref 140–400)
RBC: 3.3 10*6/uL — AB (ref 4.20–5.82)
RDW: 12.9 % (ref 11.0–14.6)
WBC: 3.9 10*3/uL — AB (ref 4.0–10.3)

## 2016-01-26 LAB — COMPREHENSIVE METABOLIC PANEL
ALBUMIN: 3 g/dL — AB (ref 3.5–5.0)
ALK PHOS: 152 U/L — AB (ref 40–150)
ALT: 18 U/L (ref 0–55)
ANION GAP: 8 meq/L (ref 3–11)
AST: 26 U/L (ref 5–34)
BUN: 14.7 mg/dL (ref 7.0–26.0)
CALCIUM: 8.3 mg/dL — AB (ref 8.4–10.4)
CHLORIDE: 113 meq/L — AB (ref 98–109)
CO2: 19 mEq/L — ABNORMAL LOW (ref 22–29)
Creatinine: 1.2 mg/dL (ref 0.7–1.3)
EGFR: 65 mL/min/{1.73_m2} — AB (ref >90)
Glucose: 146 mg/dl — ABNORMAL HIGH (ref 70–140)
POTASSIUM: 3.8 meq/L (ref 3.5–5.1)
Sodium: 140 mEq/L (ref 136–145)
Total Bilirubin: 1.84 mg/dL — ABNORMAL HIGH (ref 0.20–1.20)
Total Protein: 5.3 g/dL — ABNORMAL LOW (ref 6.4–8.3)

## 2016-01-26 LAB — IRON AND TIBC
%SAT: 16 % — ABNORMAL LOW (ref 20–55)
IRON: 37 ug/dL — AB (ref 42–163)
TIBC: 238 ug/dL (ref 202–409)
UIBC: 200 ug/dL (ref 117–376)

## 2016-01-26 LAB — FERRITIN: Ferritin: 286 ng/ml (ref 22–316)

## 2016-01-26 NOTE — Progress Notes (Signed)
Hematology and Oncology Follow Up Visit  [name] House [id number] 1946/07/16 70 y.o. 01/26/2016 8:47 AM  Principle Diagnosis: This is a 70 year old gentleman with the following diagnoses:  1. Iron deficiency anemia. This is due to a history of gastric bypass operation.  He has been receiving intermittent iron replacement.  2.    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   Interim History: [name] House presents today for a followup visit. Since last visit, he reports worsening fatigue and slight decline in his energy. He is reporting the need for more rest and more naps during the day because of lack of energy. 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 He denied any bleeding episodes such as hematochezia or melena. He denied any hemoptysis. He denied any easy bruisability.   He continues to have recurrent ascites although I have been manageable with diuretics. He denied any recent hospitalization or illnesses. He denied any alteration of mental status.  He has not reported any headaches blurred vision double vision or other mental status. He does not report any fevers, chills or sweats or appetite changes. Has not reported any chest pain shortness of breath or cough or hemoptysis. He has not reported any petechiae or or lymphadenopathy. Has not reported any frequency urgency or hematuria. Rest of his review of systems unremarkable.   Medications: I have reviewed the patient's current medications. Current Outpatient Prescriptions  Medication Sig Dispense Refill  . diphenoxylate-atropine (LOMOTIL) 2.5-0.025 MG per tablet Take 1 tablet by mouth as directed.     . furosemide (LASIX) 40 MG tablet Take 1 tablet (40 mg total) by mouth daily. 30 tablet 6  . lisinopril (PRINIVIL,ZESTRIL) 20 MG tablet Take 20 mg by mouth daily.    . nadolol (CORGARD) 20 MG tablet TAKE 1 TABLET BY MOUTH DAILY 90 tablet 1  . spironolactone (ALDACTONE)  100 MG tablet Take 1 tablet (100 mg total) by mouth daily. 30 tablet 6  . vitamin A 7500 UNIT capsule Take 7,500 Units by mouth daily.    . vitamin E 1000 UNIT capsule Take 1,000 Units by mouth daily.     No current facility-administered medications for this visit.     Allergies:  Allergies  Allergen Reactions  . Oxycodone Other (See Comments)    Caused him to fall..really sedated    Past Medical History, Surgical history, Social history, and Family History were reviewed and updated.   Physical Exam:  Blood pressure 117/50, pulse 68, temperature 98.4 F (36.9 C), temperature source Oral, resp. rate 18, height 6' (1.829 m), weight 238 lb 1.6 oz (108.001 kg), SpO2 100 %. ECOG: 1 General appearance: Chronically ill-appearing gentleman without distress today.  Head: Normocephalic, without obvious abnormality no oral ulcers or lesions. Neck: no adenopathy Lymph nodes: Cervical, supraclavicular, and axillary nodes normal. Heart:regular rate and rhythm, S1, S2 normal, no murmur, click, rub or gallop Lung:chest clear, no wheezing, rales, normal symmetric air entry no dullness to percussion. Abdomin: Soft, slightly distended with good bowel sounds. Ascites noted with shifting dullness. EXT: 1+ edema noted bilaterally.   Lab Results: Lab Results  Component Value Date   WBC 3.9* 01/26/2016   HGB 10.4* 01/26/2016   HCT 30.2* 01/26/2016   MCV 91.6 01/26/2016   PLT 62* 01/26/2016     Chemistry      Component Value Date/Time   NA 141 01/11/2016 1105   NA 140 07/28/2015 0858   K 4.7 01/11/2016 1105  K 4.1 07/28/2015 0858   CL 110 01/11/2016 1105   CL 112* 01/13/2013 0840   CO2 25 01/11/2016 1105   CO2 19* 07/28/2015 0858   BUN 21 01/11/2016 1105   BUN 13.1 07/28/2015 0858   CREATININE 1.31 01/11/2016 1105   CREATININE 1.0 07/28/2015 0858      Component Value Date/Time   CALCIUM 9.0 01/11/2016 1105   CALCIUM 8.6 07/28/2015 0858   ALKPHOS 180* 10/12/2015 1557   ALKPHOS  173* 07/28/2015 0858   AST 45* 10/12/2015 1557   AST 45* 07/28/2015 0858   ALT 32 10/12/2015 1557   ALT 39 07/28/2015 0858   BILITOT 1.5* 10/12/2015 1557   BILITOT 1.89* 07/28/2015 0858       Impression and Plan:  A 70 year old gentleman with the following issues:  1. Thrombocytopenia: This is related to splenomegaly and sequestration related to that. His portal hypertension from liver disease as a cause of his splenomegaly. He has no active bleeding and platelet count appeared adequate. 2.     Iron deficiency anemia. He appears to be more symptomatic today with hemoglobin lower than previous counts. I will check his iron studies and replace with intravenous iron as needed. Other causes of anemia and needs to be contemplated including anemia liver disease related to splenic sequestration, bone marrow suppression and possible vitamin deficiency. Other causes of anemia such as myelodysplasia or erythropoietin deficiency could be also considered. We will continue to monitor him closely and work up these causes of anemia. 3.      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 Appears to be stable and continue to follow with gastroenterology. 3.      Followup: Will be in 3 months.    [facility], MD 4/21/20178:47 AM

## 2016-01-26 NOTE — Telephone Encounter (Signed)
Gave pt appt & avs °

## 2016-02-02 ENCOUNTER — Ambulatory Visit (HOSPITAL_BASED_OUTPATIENT_CLINIC_OR_DEPARTMENT_OTHER): Payer: PPO

## 2016-02-02 VITALS — BP 135/61 | HR 65 | Temp 98.7°F | Resp 19

## 2016-02-02 DIAGNOSIS — D509 Iron deficiency anemia, unspecified: Secondary | ICD-10-CM

## 2016-02-02 MED ORDER — SODIUM CHLORIDE 0.9 % IV SOLN
Freq: Once | INTRAVENOUS | Status: AC
  Administered 2016-02-02: 10:00:00 via INTRAVENOUS

## 2016-02-02 MED ORDER — SODIUM CHLORIDE 0.9 % IV SOLN
510.0000 mg | Freq: Once | INTRAVENOUS | Status: AC
  Administered 2016-02-02: 510 mg via INTRAVENOUS
  Filled 2016-02-02: qty 17

## 2016-02-02 NOTE — Patient Instructions (Signed)

## 2016-02-09 ENCOUNTER — Ambulatory Visit (HOSPITAL_BASED_OUTPATIENT_CLINIC_OR_DEPARTMENT_OTHER): Payer: PPO

## 2016-02-09 VITALS — BP 140/68 | HR 65 | Temp 98.9°F | Resp 18

## 2016-02-09 DIAGNOSIS — D509 Iron deficiency anemia, unspecified: Secondary | ICD-10-CM | POA: Diagnosis not present

## 2016-02-09 MED ORDER — SODIUM CHLORIDE 0.9 % IV SOLN
Freq: Once | INTRAVENOUS | Status: AC
  Administered 2016-02-09: 10:00:00 via INTRAVENOUS

## 2016-02-09 MED ORDER — FERUMOXYTOL INJECTION 510 MG/17 ML
510.0000 mg | Freq: Once | INTRAVENOUS | Status: AC
Start: 2016-02-09 — End: 2016-02-09
  Administered 2016-02-09: 510 mg via INTRAVENOUS
  Filled 2016-02-09: qty 17

## 2016-02-09 NOTE — Patient Instructions (Signed)

## 2016-02-29 ENCOUNTER — Telehealth: Payer: Self-pay

## 2016-02-29 NOTE — Telephone Encounter (Signed)
-----   Message from Algernon Huxley, RN sent at 01/15/2016  4:22 PM EDT ----- Regarding: BMET Pt needs bmet in 6 weeks, order in epic

## 2016-02-29 NOTE — Telephone Encounter (Signed)
Pt aware.

## 2016-03-01 ENCOUNTER — Other Ambulatory Visit (INDEPENDENT_AMBULATORY_CARE_PROVIDER_SITE_OTHER): Payer: PPO

## 2016-03-01 DIAGNOSIS — K7581 Nonalcoholic steatohepatitis (NASH): Secondary | ICD-10-CM | POA: Diagnosis not present

## 2016-03-01 LAB — BASIC METABOLIC PANEL
BUN: 18 mg/dL (ref 6–23)
CHLORIDE: 111 meq/L (ref 96–112)
CO2: 24 mEq/L (ref 19–32)
Calcium: 9.1 mg/dL (ref 8.4–10.5)
Creatinine, Ser: 1.12 mg/dL (ref 0.40–1.50)
GFR: 69.06 mL/min (ref >60.00)
GLUCOSE: 157 mg/dL — AB (ref 70–99)
POTASSIUM: 4 meq/L (ref 3.5–5.1)
Sodium: 142 mEq/L (ref 135–145)

## 2016-03-05 DIAGNOSIS — K529 Noninfective gastroenteritis and colitis, unspecified: Secondary | ICD-10-CM | POA: Diagnosis not present

## 2016-03-05 DIAGNOSIS — F329 Major depressive disorder, single episode, unspecified: Secondary | ICD-10-CM | POA: Diagnosis not present

## 2016-03-05 DIAGNOSIS — E1151 Type 2 diabetes mellitus with diabetic peripheral angiopathy without gangrene: Secondary | ICD-10-CM | POA: Diagnosis not present

## 2016-03-05 DIAGNOSIS — K7460 Unspecified cirrhosis of liver: Secondary | ICD-10-CM | POA: Diagnosis not present

## 2016-03-05 DIAGNOSIS — Z1389 Encounter for screening for other disorder: Secondary | ICD-10-CM | POA: Diagnosis not present

## 2016-03-05 DIAGNOSIS — Z6831 Body mass index (BMI) 31.0-31.9, adult: Secondary | ICD-10-CM | POA: Diagnosis not present

## 2016-03-06 ENCOUNTER — Other Ambulatory Visit: Payer: Self-pay

## 2016-03-06 DIAGNOSIS — K7460 Unspecified cirrhosis of liver: Secondary | ICD-10-CM

## 2016-03-24 ENCOUNTER — Other Ambulatory Visit: Payer: Self-pay | Admitting: Internal Medicine

## 2016-03-28 ENCOUNTER — Ambulatory Visit (INDEPENDENT_AMBULATORY_CARE_PROVIDER_SITE_OTHER): Payer: PPO | Admitting: Internal Medicine

## 2016-03-28 ENCOUNTER — Encounter: Payer: Self-pay | Admitting: Internal Medicine

## 2016-03-28 VITALS — BP 124/60 | HR 60 | Ht 72.0 in | Wt 222.0 lb

## 2016-03-28 DIAGNOSIS — R197 Diarrhea, unspecified: Secondary | ICD-10-CM

## 2016-03-28 DIAGNOSIS — I8500 Esophageal varices without bleeding: Secondary | ICD-10-CM | POA: Diagnosis not present

## 2016-03-28 DIAGNOSIS — R188 Other ascites: Secondary | ICD-10-CM | POA: Diagnosis not present

## 2016-03-28 DIAGNOSIS — K7581 Nonalcoholic steatohepatitis (NASH): Secondary | ICD-10-CM

## 2016-03-28 DIAGNOSIS — K7460 Unspecified cirrhosis of liver: Secondary | ICD-10-CM

## 2016-03-28 DIAGNOSIS — K766 Portal hypertension: Secondary | ICD-10-CM

## 2016-03-28 MED ORDER — NADOLOL 20 MG PO TABS: 20.0000 mg | ORAL_TABLET | Freq: Every day | ORAL | Status: DC

## 2016-03-28 MED ORDER — METRONIDAZOLE 250 MG PO TABS: 250.0000 mg | ORAL_TABLET | Freq: Three times a day (TID) | ORAL | Status: DC

## 2016-03-28 MED ORDER — DIPHENOXYLATE-ATROPINE 2.5-0.025 MG PO TABS: 1.0000 | ORAL_TABLET | Freq: Two times a day (BID) | ORAL | Status: DC

## 2016-03-28 NOTE — Patient Instructions (Signed)
As discussed with Dr. Henrene Pastor:  1. Your physician has requested that you go to the basement for the following lab work IN 4 WEEKS:  CBC, CMP  2. We have sent the following medications to your pharmacy for you to pick up at your convenience:  Flagyl  3. . . We have sent the following medications to your pharmacy for you to pick up at your convenience:  Lomotil  4. Resume your diuretics - Lasix and Aldactone  5.  Low sodium diet  6.  Please follow up in 06/27/16 at 8:30am

## 2016-03-28 NOTE — Progress Notes (Signed)
HISTORY OF PRESENT ILLNESS:  [name] House is a 70 y.o. male multiple medical problems who is status post Roux-en-Y gastric bypass surgery 2005. He is followed in this office for NASH cirrhosis with portal hypertension. Small to moderate esophageal varix for which he is on nadolol. Problems with ascites for which he has been on diuretics intermittently. Other problems include symptomatic umbilical hernia and adenomatous colon polyps. Last MELD score was 13. He was last evaluated 12/11/2015. See that dictation for details. He is followed by the hematology clinic for iron deficiency anemia and receives periodic iron infusion. At the time of his last visit he was instructed to continue low-sodium diet, diuretic therapy, monitor weight, periodic blood work, surgical reevaluation for symptomatic ventral hernia, and GI follow-up in 3 months. He is accompanied today by his wife. She has multiple questions. Multiple issue since his last visit. First, he had an ammonia level measured by his PCP. Reason unclear. Apparently elevated and now on neomycin. Next, he wonders about fluid reaccumulation despite discontinuing diuretic therapy without seeking medical advice. He states that the diuretics make him urinate too much. He does mention that his weights at home have been stable or decreasing. He did see the general surgeon regarding his ventral hernia. Apparently the surgeon so that he would not operate on this patient due to his overall health issues. His weight at the time of the last visit was 222 pounds. New complaints today include diarrhea and questions regarding his medications. Patient states that he has had problems with loose stools or diarrhea since his gastric bypass surgery. However, worse over the past 6 months. Not mentioned during his last visit. He states that he has several loose bowel movements per day. Generally not nocturnal. Has Imodium and Lomotil on his medication list, but taking sporadically. No  fevers. No blood. Last colonoscopy October 2015. Last upper endoscopy November 2014. His weight today is exactly the same as his last visit. He continues with fatigue. No other complaints.  REVIEW OF SYSTEMS:  All non-GI ROS negative except for arthritis, fatigue,  Past Medical History  Diagnosis Date  . Diabetes mellitus, type 2 ([place])     resolved with gastric bypass  . Hyperlipemia   . Kidney stones   . Osteoarthritis of hip   . Hypertension   . Benign neoplasm of colon   . Diverticulosis of colon (without mention of hemorrhage)   . Iron deficiency anemia secondary to blood loss (chronic)   . Unspecified sleep apnea     Resolved  . Cirrhosis ([place])   . Liver cyst   . Renal cyst   . Esophageal varices ([place])   . Abdominal hernia   . Ascites   . NASH (nonalcoholic steatohepatitis)   . Splenomegaly     Past Surgical History  Procedure Laterality Date  . Knee surgery      Bil  . Ankle surgery      Bil  . Gastric bypass  2005  . Total hip arthroplasty  2008    right  . Colonoscopy  2012  . Polypectomy      Social History LEVELLE BALSAMO  reports that he has never smoked. He has never used smokeless tobacco. He reports that he does not drink alcohol or use illicit drugs.  family history includes Coronary artery disease in his brother; Diabetes in his mother; Heart attack in his father; Heart disease in his father. There is no history of Colon cancer or Stomach cancer.  Allergies  Allergen Reactions  . Oxycodone Other (See Comments)    Caused him to fall..really sedated       PHYSICAL EXAMINATION: Vital signs: BP 124/60 mmHg  Pulse 60  Ht 6' (1.829 m)  Wt 222 lb (100.699 kg)  BMI 30.10 kg/m2  Constitutional: Chronically ill and somewhat fatigued-appearing, no acute distress Psychiatric: alert and oriented x3, cooperative Eyes: extraocular movements intact, anicteric, conjunctiva pink Mouth: oral pharynx moist, no lesions Neck: supple without thyromegaly Lymph:  no lymphadenopathy Cardiovascular: heart regular rate and rhythm, no murmur Lungs: clear to auscultation bilaterally Abdomen: soft, obese, nontender, nondistended, no obvious or tense ascites, no peritoneal signs, normal bowel sounds, no organomegaly. Large ventral hernia as previous Rectal: Omitted Extremities: no clubbing or cyanosis. 1-2+ pitting lower extremity edema bilaterally Skin: no lesions on visible extremities Neuro: No focal deficits. No asterixis.  ASSESSMENT:  #1. NASH cirrhosis with portal hypertension #2. History of ascites and peripheral edema. No tense ascites today. Patient feels like his abdomen is full. No weight reduction since last visit #3. Esophageal varices. On nadolol for primary prophylaxis #4. History of elevated ammonia level. On neomycin by PCP #5. Diarrhea. Chronic. Worse over the past 6 months. Principal complaint today #6. Large uncomfortable umbilical hernia. Nonoperative candidate for GSU #7. Status post Roux-en-Y gastric bypass surgery 2005 #8. History of adenomatous colon polyps. Surveillance up-to-date   PLAN:  #1. Instructed to take Lomotil one twice daily on a regular basis. Hold for constipation #2. Empiric metronidazole 250 mg 3 times a day 2 weeks for possible bacterial overgrowth or occult infection responsive to metronidazole #3. Resume diuretics #4. Continue low-sodium diet #5. CBC and comprehensive metabolic panel in 4 weeks #6. Continue to monitor weight at home #7. Routine GI office follow-up in 3 months. Contact the office in the interim for questions or problems  40 minutes was spent face-to-face with the patient. Greater than 50% of the time was use for counseling regarding management of his, complicated liver disease and issues with diarrhea. As well answering multiple questions from the patient and his wife to their satisfaction.

## 2016-04-02 DIAGNOSIS — I2510 Atherosclerotic heart disease of native coronary artery without angina pectoris: Secondary | ICD-10-CM | POA: Diagnosis not present

## 2016-04-02 DIAGNOSIS — F329 Major depressive disorder, single episode, unspecified: Secondary | ICD-10-CM | POA: Diagnosis not present

## 2016-04-02 DIAGNOSIS — Z6829 Body mass index (BMI) 29.0-29.9, adult: Secondary | ICD-10-CM | POA: Diagnosis not present

## 2016-04-02 DIAGNOSIS — K7460 Unspecified cirrhosis of liver: Secondary | ICD-10-CM | POA: Diagnosis not present

## 2016-04-02 DIAGNOSIS — K529 Noninfective gastroenteritis and colitis, unspecified: Secondary | ICD-10-CM | POA: Diagnosis not present

## 2016-04-16 DIAGNOSIS — M109 Gout, unspecified: Secondary | ICD-10-CM | POA: Diagnosis not present

## 2016-04-16 DIAGNOSIS — E1151 Type 2 diabetes mellitus with diabetic peripheral angiopathy without gangrene: Secondary | ICD-10-CM | POA: Diagnosis not present

## 2016-04-16 DIAGNOSIS — I10 Essential (primary) hypertension: Secondary | ICD-10-CM | POA: Diagnosis not present

## 2016-04-16 DIAGNOSIS — E784 Other hyperlipidemia: Secondary | ICD-10-CM | POA: Diagnosis not present

## 2016-04-16 DIAGNOSIS — Z125 Encounter for screening for malignant neoplasm of prostate: Secondary | ICD-10-CM | POA: Diagnosis not present

## 2016-04-22 DIAGNOSIS — Z1389 Encounter for screening for other disorder: Secondary | ICD-10-CM | POA: Diagnosis not present

## 2016-04-22 DIAGNOSIS — K7460 Unspecified cirrhosis of liver: Secondary | ICD-10-CM | POA: Diagnosis not present

## 2016-04-22 DIAGNOSIS — K529 Noninfective gastroenteritis and colitis, unspecified: Secondary | ICD-10-CM | POA: Diagnosis not present

## 2016-04-22 DIAGNOSIS — E1151 Type 2 diabetes mellitus with diabetic peripheral angiopathy without gangrene: Secondary | ICD-10-CM | POA: Diagnosis not present

## 2016-04-22 DIAGNOSIS — I2510 Atherosclerotic heart disease of native coronary artery without angina pectoris: Secondary | ICD-10-CM | POA: Diagnosis not present

## 2016-04-22 DIAGNOSIS — Z6826 Body mass index (BMI) 26.0-26.9, adult: Secondary | ICD-10-CM | POA: Diagnosis not present

## 2016-04-22 DIAGNOSIS — M179 Osteoarthritis of knee, unspecified: Secondary | ICD-10-CM | POA: Diagnosis not present

## 2016-04-22 DIAGNOSIS — I7389 Other specified peripheral vascular diseases: Secondary | ICD-10-CM | POA: Diagnosis not present

## 2016-04-22 DIAGNOSIS — R634 Abnormal weight loss: Secondary | ICD-10-CM | POA: Diagnosis not present

## 2016-04-22 DIAGNOSIS — D696 Thrombocytopenia, unspecified: Secondary | ICD-10-CM | POA: Diagnosis not present

## 2016-04-22 DIAGNOSIS — R5383 Other fatigue: Secondary | ICD-10-CM | POA: Diagnosis not present

## 2016-04-22 DIAGNOSIS — Z0000 Encounter for general adult medical examination without abnormal findings: Secondary | ICD-10-CM | POA: Diagnosis not present

## 2016-04-26 ENCOUNTER — Telehealth: Payer: Self-pay | Admitting: Oncology

## 2016-04-26 ENCOUNTER — Other Ambulatory Visit (HOSPITAL_BASED_OUTPATIENT_CLINIC_OR_DEPARTMENT_OTHER): Payer: PPO

## 2016-04-26 ENCOUNTER — Ambulatory Visit (HOSPITAL_BASED_OUTPATIENT_CLINIC_OR_DEPARTMENT_OTHER): Payer: PPO | Admitting: Oncology

## 2016-04-26 VITALS — BP 111/45 | HR 55 | Temp 97.8°F | Resp 18 | Ht 72.0 in | Wt 191.5 lb

## 2016-04-26 DIAGNOSIS — D6959 Other secondary thrombocytopenia: Secondary | ICD-10-CM | POA: Diagnosis not present

## 2016-04-26 DIAGNOSIS — K769 Liver disease, unspecified: Secondary | ICD-10-CM | POA: Diagnosis not present

## 2016-04-26 DIAGNOSIS — D509 Iron deficiency anemia, unspecified: Secondary | ICD-10-CM | POA: Diagnosis not present

## 2016-04-26 DIAGNOSIS — D731 Hypersplenism: Secondary | ICD-10-CM | POA: Diagnosis not present

## 2016-04-26 DIAGNOSIS — D72818 Other decreased white blood cell count: Secondary | ICD-10-CM

## 2016-04-26 LAB — COMPREHENSIVE METABOLIC PANEL
ALBUMIN: 3.8 g/dL (ref 3.5–5.0)
ALK PHOS: 176 U/L — AB (ref 40–150)
ALT: 66 U/L — AB (ref 0–55)
AST: 47 U/L — ABNORMAL HIGH (ref 5–34)
Anion Gap: 9 mEq/L (ref 3–11)
BILIRUBIN TOTAL: 1 mg/dL (ref 0.20–1.20)
BUN: 67.3 mg/dL — ABNORMAL HIGH (ref 7.0–26.0)
CO2: 12 mEq/L — ABNORMAL LOW (ref 22–29)
CREATININE: 3.2 mg/dL — AB (ref 0.7–1.3)
Calcium: 8.5 mg/dL (ref 8.4–10.4)
Chloride: 116 mEq/L — ABNORMAL HIGH (ref 98–109)
EGFR: 19 mL/min/{1.73_m2} — AB (ref >90)
GLUCOSE: 154 mg/dL — AB (ref 70–140)
Potassium: 5.4 mEq/L — ABNORMAL HIGH (ref 3.5–5.1)
SODIUM: 138 meq/L (ref 136–145)
TOTAL PROTEIN: 6.4 g/dL (ref 6.4–8.3)

## 2016-04-26 LAB — CBC WITH DIFFERENTIAL/PLATELET
BASO%: 0.4 % (ref 0.0–2.0)
Basophils Absolute: 0 10*3/uL (ref 0.0–0.1)
EOS ABS: 0.1 10*3/uL (ref 0.0–0.5)
EOS%: 2.7 % (ref 0.0–7.0)
HCT: 35.4 % — ABNORMAL LOW (ref 38.4–49.9)
HEMOGLOBIN: 11.9 g/dL — AB (ref 13.0–17.1)
LYMPH%: 9.1 % — ABNORMAL LOW (ref 14.0–49.0)
MCH: 30.5 pg (ref 27.2–33.4)
MCHC: 33.7 g/dL (ref 32.0–36.0)
MCV: 90.6 fL (ref 79.3–98.0)
MONO#: 0.5 10*3/uL (ref 0.1–0.9)
MONO%: 11.8 % (ref 0.0–14.0)
NEUT%: 76 % — ABNORMAL HIGH (ref 39.0–75.0)
NEUTROS ABS: 3.5 10*3/uL (ref 1.5–6.5)
PLATELETS: 47 10*3/uL — AB (ref 140–400)
RBC: 3.91 10*6/uL — ABNORMAL LOW (ref 4.20–5.82)
RDW: 13.7 % (ref 11.0–14.6)
WBC: 4.6 10*3/uL (ref 4.0–10.3)
lymph#: 0.4 10*3/uL — ABNORMAL LOW (ref 0.9–3.3)

## 2016-04-26 LAB — IRON AND TIBC
%SAT: 52 % (ref 20–55)
Iron: 125 ug/dL (ref 42–163)
TIBC: 243 ug/dL (ref 202–409)
UIBC: 117 ug/dL (ref 117–376)

## 2016-04-26 LAB — FERRITIN: Ferritin: 628 ng/ml — ABNORMAL HIGH (ref 22–316)

## 2016-04-26 LAB — CHCC SMEAR

## 2016-04-26 NOTE — Progress Notes (Signed)
Hematology and Oncology Follow Up Visit  AOUS MODEL [id number] March 30, 1946 70 y.o. 04/26/2016 9:31 AM  Principle Diagnosis: This is a 70 year old gentleman with the following diagnoses:  1. Iron deficiency anemia. This is due to a history of gastric bypass operation.  He has been receiving intermittent iron replacement.  2.    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   Interim History: [name] House presents today for a followup visit. Since last visit, he reports no major changes in his health. He continues to have issues related to his liver disease including ascites and lower extremity edema which has improved dramatically while on diuretics. He denied any bleeding episodes including hemoptysis, hematochezia or melena. He received IV iron infusion in May 2017 and tolerated it well.  His overall health have declined but he remains reasonably active. He continues to bike regularly and drives to his appointments. 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 His overall quality of life has declined but remained reasonable.  He has not reported any headaches blurred vision double vision or other mental status. He does not report any fevers, chills or sweats or appetite changes. Has not reported any chest pain shortness of breath or cough or hemoptysis. He has not reported any petechiae or or lymphadenopathy. Has not reported any frequency urgency or hematuria. Rest of his review of systems unremarkable.   Medications: I have reviewed the patient's current medications. Current Outpatient Prescriptions  Medication Sig Dispense Refill  . diphenoxylate-atropine (LOMOTIL) 2.5-0.025 MG per tablet Take 1 tablet by mouth as directed.     . diphenoxylate-atropine (LOMOTIL) 2.5-0.025 MG tablet Take 1 tablet by mouth 2 (two) times daily. 30 tablet 0  . furosemide (LASIX) 40 MG tablet Take 1 tablet (40 mg total)  by mouth daily. 30 tablet 6  . loperamide (IMODIUM A-D) 2 MG tablet Take 2 mg by mouth 2 (two) times daily as needed for diarrhea or loose stools.    . nadolol (CORGARD) 20 MG tablet Take 1 tablet (20 mg total) by mouth daily. 90 tablet 1  . neomycin (MYCIFRADIN) 500 MG tablet Take 500 mg by mouth 2 (two) times daily.    Marland Kitchen spironolactone (ALDACTONE) 100 MG tablet Take 1 tablet (100 mg total) by mouth daily. 30 tablet 6  . valACYclovir (VALTREX) 1000 MG tablet Take 1,000 mg by mouth 3 (three) times daily.     No current facility-administered medications for this visit.     Allergies:  Allergies  Allergen Reactions  . Oxycodone Other (See Comments)    Caused him to fall..really sedated    Past Medical History, Surgical history, Social history, and Family History were reviewed and updated.   Physical Exam:  Blood pressure 111/45, pulse 55, temperature 97.8 F (36.6 C), temperature source Oral, resp. rate 18, height 6' (1.829 m), weight 191 lb 8 oz (86.864 kg), SpO2 100 %. ECOG: 1 General appearance: Chronically ill-appearing gentleman.  Head: Normocephalic, without obvious abnormality no oral Thrush noted Neck: no adenopathy Lymph nodes: Cervical, supraclavicular, and axillary nodes normal. Heart:regular rate and rhythm, S1, S2 normal, no murmur, click, rub or gallop Lung:chest clear, no wheezing, rales, normal symmetric air entry no dullness to percussion. Abdomin: Soft, slightly distended with good bowel sounds. No ascites or shifting dullness. EXT: 1 no edema noted.   Lab Results: Lab Results  Component Value Date   WBC 4.6 04/26/2016   HGB  11.9* 04/26/2016   HCT 35.4* 04/26/2016   MCV 90.6 04/26/2016   PLT 47* 04/26/2016     Chemistry      Component Value Date/Time   NA 142 03/01/2016 1030   NA 140 01/26/2016 0811   K 4.0 03/01/2016 1030   K 3.8 01/26/2016 0811   CL 111 03/01/2016 1030   CL 112* 01/13/2013 0840   CO2 24 03/01/2016 1030   CO2 19* 01/26/2016 0811    BUN 18 03/01/2016 1030   BUN 14.7 01/26/2016 0811   CREATININE 1.12 03/01/2016 1030   CREATININE 1.2 01/26/2016 0811      Component Value Date/Time   CALCIUM 9.1 03/01/2016 1030   CALCIUM 8.3* 01/26/2016 0811   ALKPHOS 152* 01/26/2016 0811   ALKPHOS 180* 10/12/2015 1557   AST 26 01/26/2016 0811   AST 45* 10/12/2015 1557   ALT 18 01/26/2016 0811   ALT 32 10/12/2015 1557   BILITOT 1.84* 01/26/2016 0811   BILITOT 1.5* 10/12/2015 1557       Impression and Plan:  A 70 year old gentleman with the following issues:  1. Thrombocytopenia: This is related to splenomegaly and sequestration related to that. This is related to his liver disease and cirrhosis. No bleeding noted at this time and his platelet counts remain stable. 2.     Iron deficiency anemia. He is status post IV iron infusion in May 2017 with his hemoglobin improved at this time. We will continue to monitor his iron studies periodically given his history of bypass operation and his chronic blood loss related to his cirrhosis of the liver. We'll supplement him with intravenous iron periodically. 3.      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 Appears to be stable and continue to follow with gastroenterology. 3.      Followup: Will be in 3 months.    [facility], MD 7/21/20179:31 AM

## 2016-04-26 NOTE — Telephone Encounter (Signed)
per pof to sch pt appt-gave pt copy of avs °

## 2016-04-27 LAB — ERYTHROPOIETIN: ERYTHROPOIETIN: 4.6 m[IU]/mL (ref 2.6–18.5)

## 2016-04-27 LAB — VITAMIN B12: Vitamin B12: 360 pg/mL (ref 211–946)

## 2016-05-01 LAB — MULTIPLE MYELOMA PANEL, SERUM
ALBUMIN SERPL ELPH-MCNC: 3.4 g/dL (ref 2.9–4.4)
ALPHA2 GLOB SERPL ELPH-MCNC: 0.6 g/dL (ref 0.4–1.0)
Albumin/Glob SerPl: 1.4 (ref 0.7–1.7)
Alpha 1: 0.2 g/dL (ref 0.0–0.4)
B-GLOBULIN SERPL ELPH-MCNC: 0.8 g/dL (ref 0.7–1.3)
GAMMA GLOB SERPL ELPH-MCNC: 1 g/dL (ref 0.4–1.8)
Globulin, Total: 2.6 g/dL (ref 2.2–3.9)
IgA, Qn, Serum: 181 mg/dL (ref 61–437)
IgG, Qn, Serum: 901 mg/dL (ref 700–1600)
IgM, Qn, Serum: 26 mg/dL (ref 20–172)
TOTAL PROTEIN: 6 g/dL (ref 6.0–8.5)

## 2016-05-03 ENCOUNTER — Telehealth: Payer: Self-pay | Admitting: Internal Medicine

## 2016-05-03 MED ORDER — RIFAXIMIN 550 MG PO TABS: 550.0000 mg | ORAL_TABLET | Freq: Two times a day (BID) | ORAL | 3 refills | Status: DC

## 2016-05-03 NOTE — Telephone Encounter (Signed)
Since he is having diarrhea, additional lactulose would not be helpful. Prescribe Xifaxan 550 mg twice a day. However, if he worsens at all, she should taken to the emergency room for evaluation. If not, keep appointment on Monday. Thanks

## 2016-05-03 NOTE — Telephone Encounter (Signed)
Wife notified of recommendations I sent rx to [facility].  Xifaxan requires prior auth.  I initiated it through cover my meds.  Will take 24 hours for review .   Wife understands to take him to the ED for worsening symptoms.

## 2016-05-03 NOTE — Telephone Encounter (Signed)
Wife reports that patient has lost 27 lbs in the last month.  She reports that he is listless or stares off into space.  He has almost constant diarrhea.  He will come in and see Ellouise Newer, PA on 05/07/16 at 9:45.  Dr. Henrene Pastor should he come for labs prior?   He is on neomycin for his previous elevated ammonia levels.

## 2016-05-07 ENCOUNTER — Other Ambulatory Visit: Payer: Self-pay

## 2016-05-07 ENCOUNTER — Other Ambulatory Visit (INDEPENDENT_AMBULATORY_CARE_PROVIDER_SITE_OTHER): Payer: PPO

## 2016-05-07 ENCOUNTER — Encounter: Payer: Self-pay | Admitting: Physician Assistant

## 2016-05-07 ENCOUNTER — Ambulatory Visit (INDEPENDENT_AMBULATORY_CARE_PROVIDER_SITE_OTHER): Payer: PPO | Admitting: Physician Assistant

## 2016-05-07 VITALS — BP 112/64 | HR 64 | Ht 71.0 in | Wt 180.6 lb

## 2016-05-07 DIAGNOSIS — Z9884 Bariatric surgery status: Secondary | ICD-10-CM | POA: Diagnosis not present

## 2016-05-07 DIAGNOSIS — K7581 Nonalcoholic steatohepatitis (NASH): Secondary | ICD-10-CM

## 2016-05-07 DIAGNOSIS — R197 Diarrhea, unspecified: Secondary | ICD-10-CM | POA: Diagnosis not present

## 2016-05-07 DIAGNOSIS — I8510 Secondary esophageal varices without bleeding: Secondary | ICD-10-CM

## 2016-05-07 DIAGNOSIS — Z87898 Personal history of other specified conditions: Secondary | ICD-10-CM

## 2016-05-07 DIAGNOSIS — K7460 Unspecified cirrhosis of liver: Secondary | ICD-10-CM

## 2016-05-07 DIAGNOSIS — R634 Abnormal weight loss: Secondary | ICD-10-CM

## 2016-05-07 LAB — BASIC METABOLIC PANEL
BUN: 59 mg/dL — ABNORMAL HIGH (ref 6–23)
CALCIUM: 9.3 mg/dL (ref 8.4–10.5)
CHLORIDE: 111 meq/L (ref 96–112)
CO2: 17 meq/L — AB (ref 19–32)
CREATININE: 3.04 mg/dL — AB (ref 0.40–1.50)
GFR: 21.8 mL/min — ABNORMAL LOW (ref >60.00)
GLUCOSE: 205 mg/dL — AB (ref 70–99)
Potassium: 5.7 mEq/L — ABNORMAL HIGH (ref 3.5–5.1)
Sodium: 135 mEq/L (ref 135–145)

## 2016-05-07 NOTE — Progress Notes (Signed)
Reviewed. Agree with initial assessment and plans

## 2016-05-07 NOTE — Progress Notes (Signed)
Chief Complaint: Diarrhea and weight loss  HPI:  [name] House is a  70 year old Caucasian male, with past medical history significant for status post Roux-en-Y gastric bypass surgery in 2005, [name] cirrhosis with portal hypertension, small to moderate esophageal varices, ascites and symptomatic umbilical hernia as well as adenomatous colon polyps, who presents to clinic today with a complaint of continued diarrhea and a 40 pound weight loss in the past month and a half.  Independent review of patient's last colonoscopy report and images from October 2015 shows signs of portal hypertension colopathy, polyp and diverticulosis. Review of EGD from November 2014 with signs of varices.  Patient did have recent labs completed on 04/26/2016 which are concerning for a creatinine of 3.2 and an increase in liver enzymes compared to 3 months ago, see full report below.  Today, the patient presents to clinic accompanied by his wife. He tells me that since he saw Dr. Henrene Pastor in June, he has continued his Lomotil increasing it to 3 times a day, but he has had no change in his multiple bowel movements. The patient tells me that when he wakes up he will have 3-4 bowel movements, the first 2 are firm and the next 2 or liquidy, occasionally he will have another loose stool throughout the day. This is been occurring for the past 7 months. This is unchanged after 2 weeks of metronidazole 250 mg 3 times a day prescribed at his last office visit with Dr. Henrene Pastor. His wife is mostly concerned as the patient has lost 40 pounds in seen being seen in our clinic a month and a half ago. Weight at time of office visit on 03/28/2016 was 222 pounds and the patient is recorded at 180 pounds today. His wife also expresses that the patient seems somewhat listless" stares a lot with his mouth open", and falls asleep at least 5 times a day in his chair. This is very abnormal. The patient continues to complain of a lower abdominal discomfort in  his bilateral lower quadrants, this is unchanged from previous. Patient also complains of a decrease in appetite over the past 4 months.  Patient tells me he is currently using his Lasix 40 mg once daily and spironolactone 100 mg once daily. He was also started on neomycin 500 mg twice a day by his primary care physician after Doreene Nest was not approved by his insurance.  Patient's social history is positive for his wife being recently diagnosed with pancreatic cancer and he is worried about this.  Patient denies fever, chills, blood in the stool, melena, change in diet, recent nausea, vomiting, heartburn, reflux or symptoms that awaken him at night.  Past Medical History:  Diagnosis Date  . Abdominal hernia   . Ascites   . Benign neoplasm of colon   . Cirrhosis ([place])   . Diabetes mellitus, type 2 ([place])    resolved with gastric bypass  . Diverticulosis of colon (without mention of hemorrhage)   . Esophageal varices ([place])   . Hyperlipemia   . Hypertension   . Iron deficiency anemia secondary to blood loss (chronic)   . Kidney stones   . Liver cyst   . NASH (nonalcoholic steatohepatitis)   . Osteoarthritis of hip   . Renal cyst   . Splenomegaly   . Unspecified sleep apnea    Resolved    Past Surgical History:  Procedure Laterality Date  . ANKLE SURGERY     Bil  . COLONOSCOPY  2012  . GASTRIC  BYPASS  2005  . KNEE SURGERY     Bil  . POLYPECTOMY    . TOTAL HIP ARTHROPLASTY  2008   right    Current Outpatient Prescriptions  Medication Sig Dispense Refill  . diphenoxylate-atropine (LOMOTIL) 2.5-0.025 MG tablet Take 1 tablet by mouth 2 (two) times daily. (Patient taking differently: Take 1 tablet by mouth 3 (three) times daily. ) 30 tablet 0  . furosemide (LASIX) 40 MG tablet Take 1 tablet (40 mg total) by mouth daily. 30 tablet 6  . nadolol (CORGARD) 20 MG tablet Take 1 tablet (20 mg total) by mouth daily. 90 tablet 1  . neomycin (MYCIFRADIN) 500 MG tablet Take 500 mg by mouth  2 (two) times daily.    Marland Kitchen spironolactone (ALDACTONE) 100 MG tablet Take 1 tablet (100 mg total) by mouth daily. 30 tablet 6   No current facility-administered medications for this visit.     Allergies as of 05/07/2016 - Review Complete 05/07/2016  Allergen Reaction Noted  . Oxycodone Other (See Comments) 07/05/2013    Family History  Problem Relation Age of Onset  . Heart attack Father     deceased due to MI  . Heart disease Father   . Diabetes Mother     deceased due to complications of DM  . Coronary artery disease Brother   . Colon cancer Neg Hx   . Stomach cancer Neg Hx   . Liver disease Neg Hx     Social History   Social History  . Marital status: Married    Spouse name: N/A  . Number of children: 1  . Years of education: N/A   Occupational History  . retired   . sports official    Social History Main Topics  . Smoking status: Never Smoker  . Smokeless tobacco: Never Used  . Alcohol use No     Comment: none  . Drug use: No  . Sexual activity: Not on file   Other Topics Concern  . Not on file   Social History Narrative  . No narrative on file    Review of Systems:    Constitutional: Positive for a 40 pound weight loss and fatigue no fever or chills HEENT: Eyes: No change in vision               Ears, Nose, Throat:  No change in hearing Skin: No rash or itching Cardiovascular: No chest pain, chest pressure, palpitations or edema Respiratory: No SOB or cough Gastrointestinal: See HPI and otherwise negative Genitourinary: No dysuria or change in urinary frequency Neurological: Positive for dizziness when standing No headache or syncope Musculoskeletal: No new muscle or joint pain Hematologic: No bleeding or bruising  Psychiatric: Positive for recent depression   Physical Exam:  Vital signs: BP 112/64   Pulse 64   Ht 5\' 11"  (1.803 m)   Wt 180 lb 9.6 oz (81.9 kg)   BMI 25.19 kg/m   General:   Weak-appearing Caucasian male appears to be in NAD,  Well developed, alert and cooperative Head:  Normocephalic and atraumatic. Eyes:   PEERL, EOMI. No icterus. Conjunctiva pink. Ears:  Normal auditory acuity. Neck:  Supple Throat: Oral cavity and pharynx without inflammation, swelling or lesion. Lungs: Respirations even and unlabored. Lungs clear to auscultation bilaterally.   No wheezes, crackles, or rhonchi.  Heart: Normal S1, S2. No MRG. Regular rate and rhythm. No peripheral edema, cyanosis or pallor.  Abdomen:  Soft, nondistended, mild TTP and right lower quadrant No  rebound or guarding. Normal bowel sounds. No appreciable masses or hepatomegaly. Umbilical hernia Rectal:  Not performed.  Msk:  Symmetrical without gross deformities. Peripheral pulses intact.  Extremities:  Without edema, no deformity or joint abnormality.  Neurologic:  Alert and  oriented x4;  grossly normal neurologically. No asterixis is Skin:   Dry and intact without significant lesions or rashes. Psychiatric: Oriented to person, place and time. Demonstrates good judgement and reason without abnormal affect or behaviors.  RELEVANT LABS AND IMAGING: CBC    Component Value Date/Time   WBC 4.6 04/26/2016 0902   WBC 3.5 (L) 10/12/2015 1557   RBC 3.91 (L) 04/26/2016 0902   RBC 4.16 (L) 10/12/2015 1557   HGB 11.9 (L) 04/26/2016 0902   HCT 35.4 (L) 04/26/2016 0902   PLT 47 (L) 04/26/2016 0902   MCV 90.6 04/26/2016 0902   MCH 30.5 04/26/2016 0902   MCH 30.9 02/14/2015 0447   MCHC 33.7 04/26/2016 0902   MCHC 33.8 10/12/2015 1557   RDW 13.7 04/26/2016 0902   LYMPHSABS 0.4 (L) 04/26/2016 0902   MONOABS 0.5 04/26/2016 0902   EOSABS 0.1 04/26/2016 0902   BASOSABS 0.0 04/26/2016 0902    CMP     Component Value Date/Time   NA 138 04/26/2016 0902   K 5.4 (H) 04/26/2016 0902   CL 111 03/01/2016 1030   CL 112 (H) 01/13/2013 0840   CO2 12 (L) 04/26/2016 0902   GLUCOSE 154 (H) 04/26/2016 0902   GLUCOSE 152 (H) 01/13/2013 0840   BUN 67.3 (H) 04/26/2016 0902    CREATININE 3.2 (HH) 04/26/2016 0902   CALCIUM 8.5 04/26/2016 0902   PROT 6.0 04/26/2016 0902   PROT 6.4 04/26/2016 0902   ALBUMIN 3.8 04/26/2016 0902   AST 47 (H) 04/26/2016 0902   ALT 66 (H) 04/26/2016 0902   ALKPHOS 176 (H) 04/26/2016 0902   BILITOT 1.00 04/26/2016 0902   GFRNONAA >60 02/14/2015 0447   GFRAA >60 02/14/2015 0447    Assessment: 1. Diarrhea: No change in diarrhea symptoms over the past month and a half since starting Lomotil 3 times a day and taking 2 weeks of metronidazole as prescribed by Dr. Henrene Pastor at last office visit; consider infectious cause versus functional versus IBS versus relation to gastric bypass  2. Status post Roux-en-Y gastric bypass surgery 2005 3. Esophageal varices: On nadolol for prophylaxis, patient's last EGD was in 2014, repeat will need to be considered in the near future 4. History of ascites and peripheral edema: No ascites today, patient has lost 40 pounds since last office visit 5. [name] cirrhosis with portal hypertension 6. Weight loss: A total of 40 pounds in the past month and a half, likely related decrease in appetite as well as diuretics and diarrhea  Plan: 1. Will recheck BMP today and will recheck again in 1 week 2. Ordered further stool studies including stool culture, ova and parasites, C. difficile and GI intestinal panel 3. Instructed patient to decrease diuretics, he should now be taking Lasix 20 mg once a day and spironolactone 50 mg once daily (this is a decrease from Lasix 40 mg once daily and spironolactone 100 mg daily) 4. Repeat EGD will need to be considered when acute issues have been resolved 5. Discussed above plan and recommendations with Dr. Ardis Hughs in clinic today, explained to the family that Dr. Henrene Pastor will also receive notes and recommendations from today's visit, they were instructed to call the office with any concerns or increasing/worsening symptoms, we also discussed that  they could proceed to the ER if they felt  necessary.  A total of 45 minutes was spent in consultation and coordination of care for this patient today  Ellouise Newer, [facility] 05/07/2016, 10:36 AM  Cc: Leanna Battles, MD

## 2016-05-07 NOTE — Patient Instructions (Addendum)
Your physician has requested that you go to the basement for the lab work before leaving today. Stool studies  Come back in a week and have more bloodwork drawn (BMET).   Change your lasix to 20mg  daily.    Change your spironolactone to 50mg  daily.   Follow up with Dr Henrene Pastor 06/27/16 at 8:30AM.   Please call back and set up an appointment with Ellouise Newer PA-C for 2-3 weeks from now.   I appreciate the opportunity to care for you.

## 2016-05-08 ENCOUNTER — Other Ambulatory Visit: Payer: Self-pay

## 2016-05-08 DIAGNOSIS — K7460 Unspecified cirrhosis of liver: Secondary | ICD-10-CM

## 2016-05-09 ENCOUNTER — Telehealth: Payer: Self-pay | Admitting: Internal Medicine

## 2016-05-09 NOTE — Telephone Encounter (Signed)
Spoke with Mrs. Cimini. We reviewed each medication. She requests a new print out of the list which will be mailed to her. She states they do not use the My Chart account.

## 2016-05-10 ENCOUNTER — Other Ambulatory Visit: Payer: PPO

## 2016-05-10 DIAGNOSIS — R197 Diarrhea, unspecified: Secondary | ICD-10-CM | POA: Diagnosis not present

## 2016-05-11 LAB — CLOSTRIDIUM DIFFICILE BY PCR: CDIFFPCR: NOT DETECTED

## 2016-05-13 LAB — GASTROINTESTINAL PATHOGEN PANEL PCR
C. DIFFICILE TOX A/B, PCR: NOT DETECTED
CAMPYLOBACTER, PCR: NOT DETECTED
Cryptosporidium, PCR: NOT DETECTED
E COLI (ETEC) LT/ST, PCR: NOT DETECTED
E COLI 0157, PCR: NOT DETECTED
E coli (STEC) stx1/stx2, PCR: NOT DETECTED
GIARDIA LAMBLIA, PCR: NOT DETECTED
Norovirus, PCR: NOT DETECTED
Rotavirus A, PCR: NOT DETECTED
Salmonella, PCR: NOT DETECTED
Shigella, PCR: NOT DETECTED

## 2016-05-14 ENCOUNTER — Telehealth: Payer: Self-pay

## 2016-05-14 ENCOUNTER — Other Ambulatory Visit (INDEPENDENT_AMBULATORY_CARE_PROVIDER_SITE_OTHER): Payer: PPO

## 2016-05-14 ENCOUNTER — Other Ambulatory Visit: Payer: Self-pay

## 2016-05-14 DIAGNOSIS — R197 Diarrhea, unspecified: Secondary | ICD-10-CM

## 2016-05-14 DIAGNOSIS — K7460 Unspecified cirrhosis of liver: Secondary | ICD-10-CM | POA: Diagnosis not present

## 2016-05-14 LAB — BASIC METABOLIC PANEL
BUN: 47 mg/dL — AB (ref 6–23)
CHLORIDE: 113 meq/L — AB (ref 96–112)
CO2: 16 mEq/L — ABNORMAL LOW (ref 19–32)
CREATININE: 2.69 mg/dL — AB (ref 0.40–1.50)
Calcium: 8.5 mg/dL (ref 8.4–10.5)
GFR: 25.11 mL/min — ABNORMAL LOW (ref >60.00)
GLUCOSE: 270 mg/dL — AB (ref 70–99)
POTASSIUM: 4.5 meq/L (ref 3.5–5.1)
Sodium: 137 mEq/L (ref 135–145)

## 2016-05-14 LAB — STOOL CULTURE

## 2016-05-14 NOTE — Telephone Encounter (Signed)
-----   Message from Marlon Pel, RN sent at 05/14/2016 10:19 AM EDT ----- Regarding: FW: status? Will you please look into this  Thanks    Sheri  ----- Message ----- From: Levin Erp, PA Sent: 05/14/2016   8:50 AM To: Marlon Pel, RN Subject: status?                                        Can you follow up on status of this order?  Thx-JLL ----- Message ----- From: SYSTEM Sent: 05/12/2016  12:05 AM To: Levin Erp, PA

## 2016-05-14 NOTE — Telephone Encounter (Signed)
Spoke with patient and reminded him to come get another BMET this week and to pick up a stool container for the O & P test.  He verbalized understanding.

## 2016-05-15 LAB — OVA AND PARASITE EXAMINATION: OP: NONE SEEN

## 2016-06-13 DIAGNOSIS — Z6825 Body mass index (BMI) 25.0-25.9, adult: Secondary | ICD-10-CM | POA: Diagnosis not present

## 2016-06-13 DIAGNOSIS — D696 Thrombocytopenia, unspecified: Secondary | ICD-10-CM | POA: Diagnosis not present

## 2016-06-13 DIAGNOSIS — I10 Essential (primary) hypertension: Secondary | ICD-10-CM | POA: Diagnosis not present

## 2016-06-13 DIAGNOSIS — K529 Noninfective gastroenteritis and colitis, unspecified: Secondary | ICD-10-CM | POA: Diagnosis not present

## 2016-06-13 DIAGNOSIS — I2510 Atherosclerotic heart disease of native coronary artery without angina pectoris: Secondary | ICD-10-CM | POA: Diagnosis not present

## 2016-06-13 DIAGNOSIS — E1151 Type 2 diabetes mellitus with diabetic peripheral angiopathy without gangrene: Secondary | ICD-10-CM | POA: Diagnosis not present

## 2016-06-13 DIAGNOSIS — K7460 Unspecified cirrhosis of liver: Secondary | ICD-10-CM | POA: Diagnosis not present

## 2016-06-13 DIAGNOSIS — R5383 Other fatigue: Secondary | ICD-10-CM | POA: Diagnosis not present

## 2016-06-19 ENCOUNTER — Telehealth: Payer: Self-pay

## 2016-06-19 NOTE — Telephone Encounter (Signed)
Received call from [place], GI Navigator from the cancer center. She was calling to let Dr. Henrene Pastor know that the pts wife is concerned about the pt. States pt is more confused and aggitated over the past couple of weeks. States he has also become argumentative. She wanted to let Dr. Henrene Pastor know before he sees pt at his OV that is scheduled for 06/27/16. Ms. Stika is recovering from [place] surgery and getting ready to start chemotherapy. Difficult time for family right now. Dr. Henrene Pastor notified.

## 2016-06-19 NOTE — Telephone Encounter (Signed)
Please see what he is taking, if anything, for hepatic encephalopathy such as lactulose or Xifaxan. We may need to start or adjust. However, If confusion is significant, he should be thoroughly evaluated at the hospital.

## 2016-06-20 ENCOUNTER — Telehealth: Payer: Self-pay | Admitting: Internal Medicine

## 2016-06-20 ENCOUNTER — Other Ambulatory Visit: Payer: Self-pay

## 2016-06-20 MED ORDER — RIFAXIMIN 550 MG PO TABS: 550.0000 mg | ORAL_TABLET | Freq: Two times a day (BID) | ORAL | 0 refills | Status: DC

## 2016-06-20 MED ORDER — LACTULOSE ENCEPHALOPATHY 10 GM/15ML PO SOLN: 20.0000 g | Freq: Two times a day (BID) | ORAL | 0 refills | Status: DC

## 2016-06-20 NOTE — Telephone Encounter (Signed)
Pleased start Xifaxan 550 mg twice daily and lactulose 30 mL twice daily to achieve 3-5 bowel movements

## 2016-06-20 NOTE — Telephone Encounter (Signed)
Pt is not aware that his wife had Merceda Elks RN call. This RN placed call to pt and told pt we needed to go over his medication list prior to his OV. Pt is not taking any lactulose or xifaxan at this time. Pt is aware of appt 06/27/16@8 :30am.

## 2016-06-20 NOTE — Telephone Encounter (Signed)
Spoke with pts wife and she is aware. Script sent in for lactulose. [facility]. Wife knows to expect a call from [facility].

## 2016-06-20 NOTE — Telephone Encounter (Signed)
Patient's wife wanted clarification on how much Lactulose to give patient.  She was unclear how to measure 58ml's he is to take twice a day.  I told her to go to the pharmacy and get the pharmacist to direct her to a pediatric plastic measuring spoon or cup.  She asked if he should continue taking all his medications.  I told her that unless someone told him to stop, then yes.  She agreed.

## 2016-06-21 ENCOUNTER — Telehealth: Payer: Self-pay | Admitting: Internal Medicine

## 2016-06-21 NOTE — Telephone Encounter (Signed)
Noted  

## 2016-06-25 DIAGNOSIS — D698 Other specified hemorrhagic conditions: Secondary | ICD-10-CM | POA: Diagnosis not present

## 2016-06-25 DIAGNOSIS — Z6825 Body mass index (BMI) 25.0-25.9, adult: Secondary | ICD-10-CM | POA: Diagnosis not present

## 2016-06-25 DIAGNOSIS — E1151 Type 2 diabetes mellitus with diabetic peripheral angiopathy without gangrene: Secondary | ICD-10-CM | POA: Diagnosis not present

## 2016-06-25 DIAGNOSIS — K7460 Unspecified cirrhosis of liver: Secondary | ICD-10-CM | POA: Diagnosis not present

## 2016-06-25 DIAGNOSIS — R5383 Other fatigue: Secondary | ICD-10-CM | POA: Diagnosis not present

## 2016-06-25 DIAGNOSIS — R634 Abnormal weight loss: Secondary | ICD-10-CM | POA: Diagnosis not present

## 2016-06-25 DIAGNOSIS — N179 Acute kidney failure, unspecified: Secondary | ICD-10-CM | POA: Diagnosis not present

## 2016-06-25 DIAGNOSIS — I10 Essential (primary) hypertension: Secondary | ICD-10-CM | POA: Diagnosis not present

## 2016-06-25 DIAGNOSIS — I2510 Atherosclerotic heart disease of native coronary artery without angina pectoris: Secondary | ICD-10-CM | POA: Diagnosis not present

## 2016-06-25 DIAGNOSIS — Z23 Encounter for immunization: Secondary | ICD-10-CM | POA: Diagnosis not present

## 2016-06-27 ENCOUNTER — Ambulatory Visit (INDEPENDENT_AMBULATORY_CARE_PROVIDER_SITE_OTHER): Payer: PPO | Admitting: Internal Medicine

## 2016-06-27 ENCOUNTER — Encounter: Payer: Self-pay | Admitting: Internal Medicine

## 2016-06-27 VITALS — BP 110/58 | HR 60 | Ht 71.5 in | Wt 185.4 lb

## 2016-06-27 DIAGNOSIS — K7581 Nonalcoholic steatohepatitis (NASH): Secondary | ICD-10-CM

## 2016-06-27 DIAGNOSIS — K7290 Hepatic failure, unspecified without coma: Secondary | ICD-10-CM

## 2016-06-27 DIAGNOSIS — K7682 Hepatic encephalopathy: Secondary | ICD-10-CM

## 2016-06-27 DIAGNOSIS — Z87898 Personal history of other specified conditions: Secondary | ICD-10-CM | POA: Diagnosis not present

## 2016-06-27 DIAGNOSIS — K7460 Unspecified cirrhosis of liver: Secondary | ICD-10-CM

## 2016-06-27 DIAGNOSIS — R197 Diarrhea, unspecified: Secondary | ICD-10-CM | POA: Diagnosis not present

## 2016-06-27 NOTE — Patient Instructions (Signed)
Your physician has requested that you go to the basement for lab work in one month.  Please follow up with Dr. Henrene Pastor on 09/26/2016 at 10:45am.

## 2016-06-27 NOTE — Progress Notes (Signed)
HISTORY OF PRESENT ILLNESS:  [name] House is a 70 y.o. male with multiple medical problems who is status post Roux-en-Y gastric bypass surgery 2005. He has a history of adenomatous colon polyps but is followed in this office principally for NASH related cirrhosis complicated by portal hypertension with intermittent ascites and hepatic encephalopathy. I last saw the patient 03/28/2016. Dictation he was last evaluated by GI physician assistant 05/07/2016. Since I saw him last his wife was diagnosed with pancreatic cancer and underwent surgery. Patient himself had been having problems with diarrhea and symptoms of mild encephalopathy. He presents today for routine follow-up. He did undergo blood work at his primary care physician's office 2 days ago. I have requested that lab work. I have received that lab work. He has chronic renal insufficiency. This is stable with BUN 43 and creatinine 2.5. Electrolytes look fine including sodium 141 and potassium 4.8. His hemoglobin is 9.5 and platelets 72. Patient reports that since starting back on lactulose his energy levels and thought pattern have improved tremendously. Currently taking 30 mL twice daily. He describes 4 loose bowel movements in the morning. No bowel movements thereafter. He does take 2 Lomotil during the day. He is on nadolol for variceal prophylaxis. Dr. Sharlett Iles put him on neomycin 1 g twice daily. His diuretics include Lasix 40 mg daily and Aldactone 100 mg daily he denies issues with fluid. His abdominal discomfort related to large ventral hernia is stable and unchanged. No new complaints REVIEW OF SYSTEMS:  All non-GI ROS negative except for Depression   Past Medical History:  Diagnosis Date  . Abdominal hernia   . Ascites   . Benign neoplasm of colon   . Cirrhosis ([place])   . Diabetes mellitus, type 2 ([place])    resolved with gastric bypass  . Diverticulosis of colon (without mention of hemorrhage)   . Esophageal varices ([place])   .  Hyperlipemia   . Hypertension   . Iron deficiency anemia secondary to blood loss (chronic)   . Kidney stones   . Liver cyst   . NASH (nonalcoholic steatohepatitis)   . Osteoarthritis of hip   . Renal cyst   . Splenomegaly   . Unspecified sleep apnea    Resolved    Past Surgical History:  Procedure Laterality Date  . ANKLE SURGERY     Bil  . COLONOSCOPY  2012  . GASTRIC BYPASS  2005  . KNEE SURGERY     Bil  . POLYPECTOMY    . TOTAL HIP ARTHROPLASTY  2008   right    Social History [name]  reports that he has never smoked. He has never used smokeless tobacco. He reports that he does not drink alcohol or use drugs.  family history includes Coronary artery disease in his brother; Diabetes in his mother; Heart attack in his father; Heart disease in his father.  Allergies  Allergen Reactions  . Oxycodone Other (See Comments)    Caused him to fall..really sedated       PHYSICAL EXAMINATION: Vital signs: BP (!) 110/58 (BP Location: Left Arm, Patient Position: Sitting, Cuff Size: Normal)   Pulse 60   Ht 5' 11.5" (1.816 m)   Wt 185 lb 6 oz (84.1 kg)   BMI 25.49 kg/m   Constitutional: Chronically ill-appearing, no acute distress Head: Bilateral temporal wasting  Psychiatric: alert and oriented x3, cooperative Eyes: extraocular movements intact, anicteric, conjunctiva pink Mouth: oral pharynx moist, no lesions Neck: supple without thyromegaly Lymph:  no  lymphadenopathy Cardiovascular: heart regular rate and rhythm, no murmur Lungs: clear to auscultation bilaterally Abdomen: soft, nontender, nondistended, no obvious ascites, no peritoneal signs, normal bowel sounds, no organomegaly. Large ventral hernia Rectal:Omitted  Extremities: no clubbing or cyanosis. One to 2+ pitting  lower extremity edema bilaterally Skin: no lesions on visible extremities Neuro: No focal deficits. No asterixis.    ASSESSMENT:  #1. NASH cirrhosis with portal hypertension. Stable #2.  History of ascites and peripheral edema. No obvious ascites today. Weight stable. Mild peripheral edema #3. Esophageal varices. On nadolol for primary prophylaxis #4. History of mild hepatic encephalopathy. No evidence of such currently. On lactulose and neomycin #5. Chronic diarrhea. Stable #6. Large uncomfortable umbilical hernia. Nonoperative candidate per [facility] #7. Status post Roux-en-Y gastric bypass surgery 2005 #8. History of adenomatous colon polyps #9. Chronic renal insufficiency  PLAN:  #1. Continue current diuretics without change #2. Continue medications for hepatic encephalopathy without change #3. Continue beta blocker without change #4. Continue antidiarrheal as needed #5. Basic metabolic panel in 4 weeks #6. Routine office follow-up in 3 months #. Ongoing general medical care with Dr. Philip Aspen  25 minutes was spent face-to-face with the patient. Greater than 50% of the time was used for counseling and coordinating care regarding his chronic liver disease and multiple complications thereof

## 2016-07-19 DIAGNOSIS — N179 Acute kidney failure, unspecified: Secondary | ICD-10-CM | POA: Diagnosis not present

## 2016-07-19 DIAGNOSIS — E1151 Type 2 diabetes mellitus with diabetic peripheral angiopathy without gangrene: Secondary | ICD-10-CM | POA: Diagnosis not present

## 2016-07-19 DIAGNOSIS — R634 Abnormal weight loss: Secondary | ICD-10-CM | POA: Diagnosis not present

## 2016-07-19 DIAGNOSIS — Z6830 Body mass index (BMI) 30.0-30.9, adult: Secondary | ICD-10-CM | POA: Diagnosis not present

## 2016-07-19 DIAGNOSIS — D696 Thrombocytopenia, unspecified: Secondary | ICD-10-CM | POA: Diagnosis not present

## 2016-07-19 DIAGNOSIS — R5383 Other fatigue: Secondary | ICD-10-CM | POA: Diagnosis not present

## 2016-07-24 ENCOUNTER — Telehealth: Payer: Self-pay | Admitting: Internal Medicine

## 2016-07-24 NOTE — Telephone Encounter (Signed)
Spoke with patient and reviewed his last office note which did not reference Neomycin at all.  Dr. Blanch Media instructions were to continue all his current medications.  At patient's request we reveiwed his medications so he was clear on what he was supposed to take.  Patient has an office visit in December and I encouraged him to mention it at that time if still had questions.  Patient agreed.

## 2016-08-02 ENCOUNTER — Ambulatory Visit: Payer: PPO | Admitting: Oncology

## 2016-08-02 ENCOUNTER — Other Ambulatory Visit: Payer: PPO

## 2016-08-08 DIAGNOSIS — I10 Essential (primary) hypertension: Secondary | ICD-10-CM | POA: Diagnosis not present

## 2016-08-23 DIAGNOSIS — R634 Abnormal weight loss: Secondary | ICD-10-CM | POA: Diagnosis not present

## 2016-08-23 DIAGNOSIS — E1151 Type 2 diabetes mellitus with diabetic peripheral angiopathy without gangrene: Secondary | ICD-10-CM | POA: Diagnosis not present

## 2016-08-23 DIAGNOSIS — R5383 Other fatigue: Secondary | ICD-10-CM | POA: Diagnosis not present

## 2016-08-23 DIAGNOSIS — K429 Umbilical hernia without obstruction or gangrene: Secondary | ICD-10-CM | POA: Diagnosis not present

## 2016-08-23 DIAGNOSIS — K529 Noninfective gastroenteritis and colitis, unspecified: Secondary | ICD-10-CM | POA: Diagnosis not present

## 2016-08-23 DIAGNOSIS — Z6827 Body mass index (BMI) 27.0-27.9, adult: Secondary | ICD-10-CM | POA: Diagnosis not present

## 2016-08-23 DIAGNOSIS — D696 Thrombocytopenia, unspecified: Secondary | ICD-10-CM | POA: Diagnosis not present

## 2016-08-23 DIAGNOSIS — K7460 Unspecified cirrhosis of liver: Secondary | ICD-10-CM | POA: Diagnosis not present

## 2016-09-16 DIAGNOSIS — E1151 Type 2 diabetes mellitus with diabetic peripheral angiopathy without gangrene: Secondary | ICD-10-CM | POA: Diagnosis not present

## 2016-09-16 DIAGNOSIS — K7460 Unspecified cirrhosis of liver: Secondary | ICD-10-CM | POA: Diagnosis not present

## 2016-09-16 DIAGNOSIS — R5383 Other fatigue: Secondary | ICD-10-CM | POA: Diagnosis not present

## 2016-09-16 DIAGNOSIS — K529 Noninfective gastroenteritis and colitis, unspecified: Secondary | ICD-10-CM | POA: Diagnosis not present

## 2016-09-16 DIAGNOSIS — Z6827 Body mass index (BMI) 27.0-27.9, adult: Secondary | ICD-10-CM | POA: Diagnosis not present

## 2016-09-26 ENCOUNTER — Ambulatory Visit: Payer: PPO | Admitting: Internal Medicine

## 2016-10-17 DIAGNOSIS — R5383 Other fatigue: Secondary | ICD-10-CM | POA: Diagnosis not present

## 2016-10-17 DIAGNOSIS — F329 Major depressive disorder, single episode, unspecified: Secondary | ICD-10-CM | POA: Diagnosis not present

## 2016-10-17 DIAGNOSIS — Z6828 Body mass index (BMI) 28.0-28.9, adult: Secondary | ICD-10-CM | POA: Diagnosis not present

## 2016-10-17 DIAGNOSIS — G4719 Other hypersomnia: Secondary | ICD-10-CM | POA: Diagnosis not present

## 2016-10-17 DIAGNOSIS — I2510 Atherosclerotic heart disease of native coronary artery without angina pectoris: Secondary | ICD-10-CM | POA: Diagnosis not present

## 2016-10-17 DIAGNOSIS — Z1389 Encounter for screening for other disorder: Secondary | ICD-10-CM | POA: Diagnosis not present

## 2016-10-17 DIAGNOSIS — K7460 Unspecified cirrhosis of liver: Secondary | ICD-10-CM | POA: Diagnosis not present

## 2016-10-17 DIAGNOSIS — G4710 Hypersomnia, unspecified: Secondary | ICD-10-CM | POA: Diagnosis not present

## 2016-11-13 ENCOUNTER — Encounter (HOSPITAL_COMMUNITY): Payer: Self-pay

## 2016-11-13 ENCOUNTER — Ambulatory Visit: Payer: PPO | Admitting: Internal Medicine

## 2016-11-13 ENCOUNTER — Inpatient Hospital Stay (HOSPITAL_COMMUNITY)
Admission: EM | Admit: 2016-11-13 | Discharge: 2016-11-18 | DRG: 690 | Disposition: A | Discharge status: Skilled Nursing Facility | Payer: PPO | Attending: Family Medicine | Admitting: Family Medicine

## 2016-11-13 ENCOUNTER — Emergency Department (HOSPITAL_COMMUNITY): Payer: PPO

## 2016-11-13 DIAGNOSIS — N184 Chronic kidney disease, stage 4 (severe): Secondary | ICD-10-CM | POA: Diagnosis present

## 2016-11-13 DIAGNOSIS — Z9884 Bariatric surgery status: Secondary | ICD-10-CM

## 2016-11-13 DIAGNOSIS — Z833 Family history of diabetes mellitus: Secondary | ICD-10-CM

## 2016-11-13 DIAGNOSIS — K7581 Nonalcoholic steatohepatitis (NASH): Secondary | ICD-10-CM | POA: Diagnosis present

## 2016-11-13 DIAGNOSIS — D126 Benign neoplasm of colon, unspecified: Secondary | ICD-10-CM | POA: Diagnosis present

## 2016-11-13 DIAGNOSIS — R278 Other lack of coordination: Secondary | ICD-10-CM | POA: Diagnosis not present

## 2016-11-13 DIAGNOSIS — Z8249 Family history of ischemic heart disease and other diseases of the circulatory system: Secondary | ICD-10-CM

## 2016-11-13 DIAGNOSIS — E119 Type 2 diabetes mellitus without complications: Secondary | ICD-10-CM | POA: Diagnosis not present

## 2016-11-13 DIAGNOSIS — Z885 Allergy status to narcotic agent status: Secondary | ICD-10-CM

## 2016-11-13 DIAGNOSIS — R2689 Other abnormalities of gait and mobility: Secondary | ICD-10-CM | POA: Diagnosis not present

## 2016-11-13 DIAGNOSIS — I10 Essential (primary) hypertension: Secondary | ICD-10-CM | POA: Diagnosis not present

## 2016-11-13 DIAGNOSIS — R509 Fever, unspecified: Secondary | ICD-10-CM | POA: Diagnosis present

## 2016-11-13 DIAGNOSIS — Z96641 Presence of right artificial hip joint: Secondary | ICD-10-CM | POA: Diagnosis present

## 2016-11-13 DIAGNOSIS — D500 Iron deficiency anemia secondary to blood loss (chronic): Secondary | ICD-10-CM | POA: Diagnosis not present

## 2016-11-13 DIAGNOSIS — K7290 Hepatic failure, unspecified without coma: Secondary | ICD-10-CM | POA: Diagnosis not present

## 2016-11-13 DIAGNOSIS — N3000 Acute cystitis without hematuria: Principal | ICD-10-CM | POA: Diagnosis present

## 2016-11-13 DIAGNOSIS — I959 Hypotension, unspecified: Secondary | ICD-10-CM | POA: Diagnosis not present

## 2016-11-13 DIAGNOSIS — N390 Urinary tract infection, site not specified: Secondary | ICD-10-CM | POA: Diagnosis present

## 2016-11-13 DIAGNOSIS — E1122 Type 2 diabetes mellitus with diabetic chronic kidney disease: Secondary | ICD-10-CM | POA: Diagnosis present

## 2016-11-13 DIAGNOSIS — E872 Acidosis, unspecified: Secondary | ICD-10-CM | POA: Diagnosis present

## 2016-11-13 DIAGNOSIS — R0602 Shortness of breath: Secondary | ICD-10-CM | POA: Diagnosis not present

## 2016-11-13 DIAGNOSIS — I8500 Esophageal varices without bleeding: Secondary | ICD-10-CM | POA: Diagnosis not present

## 2016-11-13 DIAGNOSIS — K7460 Unspecified cirrhosis of liver: Secondary | ICD-10-CM | POA: Diagnosis not present

## 2016-11-13 DIAGNOSIS — E861 Hypovolemia: Secondary | ICD-10-CM | POA: Diagnosis not present

## 2016-11-13 DIAGNOSIS — I129 Hypertensive chronic kidney disease with stage 1 through stage 4 chronic kidney disease, or unspecified chronic kidney disease: Secondary | ICD-10-CM | POA: Diagnosis not present

## 2016-11-13 DIAGNOSIS — E860 Dehydration: Secondary | ICD-10-CM | POA: Diagnosis present

## 2016-11-13 DIAGNOSIS — R269 Unspecified abnormalities of gait and mobility: Secondary | ICD-10-CM | POA: Diagnosis not present

## 2016-11-13 DIAGNOSIS — K5730 Diverticulosis of large intestine without perforation or abscess without bleeding: Secondary | ICD-10-CM | POA: Diagnosis present

## 2016-11-13 DIAGNOSIS — K766 Portal hypertension: Secondary | ICD-10-CM | POA: Diagnosis present

## 2016-11-13 DIAGNOSIS — B961 Klebsiella pneumoniae [K. pneumoniae] as the cause of diseases classified elsewhere: Secondary | ICD-10-CM | POA: Diagnosis present

## 2016-11-13 DIAGNOSIS — E785 Hyperlipidemia, unspecified: Secondary | ICD-10-CM | POA: Diagnosis not present

## 2016-11-13 DIAGNOSIS — D696 Thrombocytopenia, unspecified: Secondary | ICD-10-CM | POA: Diagnosis not present

## 2016-11-13 DIAGNOSIS — R188 Other ascites: Secondary | ICD-10-CM | POA: Diagnosis present

## 2016-11-13 DIAGNOSIS — R531 Weakness: Secondary | ICD-10-CM | POA: Diagnosis not present

## 2016-11-13 DIAGNOSIS — N179 Acute kidney failure, unspecified: Secondary | ICD-10-CM | POA: Diagnosis not present

## 2016-11-13 DIAGNOSIS — R7881 Bacteremia: Secondary | ICD-10-CM | POA: Diagnosis not present

## 2016-11-13 DIAGNOSIS — G4730 Sleep apnea, unspecified: Secondary | ICD-10-CM | POA: Diagnosis not present

## 2016-11-13 DIAGNOSIS — A498 Other bacterial infections of unspecified site: Secondary | ICD-10-CM

## 2016-11-13 LAB — CBC WITH DIFFERENTIAL/PLATELET
BASOS PCT: 0 %
Basophils Absolute: 0 10*3/uL (ref 0.0–0.1)
EOS ABS: 0 10*3/uL (ref 0.0–0.7)
EOS PCT: 0 %
HCT: 30.7 % — ABNORMAL LOW (ref 39.0–52.0)
Hemoglobin: 10.8 g/dL — ABNORMAL LOW (ref 13.0–17.0)
LYMPHS ABS: 0.1 10*3/uL — AB (ref 0.7–4.0)
Lymphocytes Relative: 2 %
MCH: 31.2 pg (ref 26.0–34.0)
MCHC: 35.2 g/dL (ref 30.0–36.0)
MCV: 88.7 fL (ref 78.0–100.0)
MONOS PCT: 1 %
Monocytes Absolute: 0.1 10*3/uL (ref 0.1–1.0)
Neutro Abs: 8 10*3/uL — ABNORMAL HIGH (ref 1.7–7.7)
Neutrophils Relative %: 97 %
PLATELETS: 36 10*3/uL — AB (ref 150–400)
RBC: 3.46 MIL/uL — ABNORMAL LOW (ref 4.22–5.81)
RDW: 14.7 % (ref 11.5–15.5)
WBC: 8.2 10*3/uL (ref 4.0–10.5)

## 2016-11-13 LAB — COMPREHENSIVE METABOLIC PANEL
ALBUMIN: 3 g/dL — AB (ref 3.5–5.0)
ALT: <5 U/L — ABNORMAL LOW (ref 17–63)
ANION GAP: 12 (ref 5–15)
AST: 118 U/L — ABNORMAL HIGH (ref 15–41)
Alkaline Phosphatase: 175 U/L — ABNORMAL HIGH (ref 38–126)
BILIRUBIN TOTAL: 3.5 mg/dL — AB (ref 0.3–1.2)
BUN: 36 mg/dL — ABNORMAL HIGH (ref 6–20)
CHLORIDE: 114 mmol/L — AB (ref 101–111)
CO2: 13 mmol/L — AB (ref 22–32)
Calcium: 8.2 mg/dL — ABNORMAL LOW (ref 8.9–10.3)
Creatinine, Ser: 2.45 mg/dL — ABNORMAL HIGH (ref 0.61–1.24)
GFR calc Af Amer: 29 mL/min — ABNORMAL LOW (ref >60)
GFR calc non Af Amer: 25 mL/min — ABNORMAL LOW (ref >60)
GLUCOSE: 123 mg/dL — AB (ref 65–99)
POTASSIUM: 4.6 mmol/L (ref 3.5–5.1)
SODIUM: 139 mmol/L (ref 135–145)
TOTAL PROTEIN: 5.3 g/dL — AB (ref 6.5–8.1)

## 2016-11-13 LAB — I-STAT CHEM 8, ED
BUN: 30 mg/dL — ABNORMAL HIGH (ref 6–20)
CALCIUM ION: 1.09 mmol/L — AB (ref 1.15–1.40)
Chloride: 113 mmol/L — ABNORMAL HIGH (ref 101–111)
Creatinine, Ser: 2.5 mg/dL — ABNORMAL HIGH (ref 0.61–1.24)
Glucose, Bld: 117 mg/dL — ABNORMAL HIGH (ref 65–99)
HEMATOCRIT: 28 % — AB (ref 39.0–52.0)
HEMOGLOBIN: 9.5 g/dL — AB (ref 13.0–17.0)
Potassium: 4.6 mmol/L (ref 3.5–5.1)
SODIUM: 141 mmol/L (ref 135–145)
TCO2: 13 mmol/L (ref 0–100)

## 2016-11-13 LAB — URINALYSIS, ROUTINE W REFLEX MICROSCOPIC
Bilirubin Urine: NEGATIVE
GLUCOSE, UA: NEGATIVE mg/dL
Ketones, ur: NEGATIVE mg/dL
NITRITE: POSITIVE — AB
PROTEIN: NEGATIVE mg/dL
Specific Gravity, Urine: 1.011 (ref 1.005–1.030)
pH: 5 (ref 5.0–8.0)

## 2016-11-13 LAB — INFLUENZA PANEL BY PCR (TYPE A & B)
Influenza A By PCR: NEGATIVE
Influenza B By PCR: NEGATIVE

## 2016-11-13 LAB — AMMONIA: Ammonia: 49 umol/L — ABNORMAL HIGH (ref 9–35)

## 2016-11-13 MED ORDER — DEXTROSE 5 % IV SOLN
1.0000 g | Freq: Once | INTRAVENOUS | Status: AC
  Administered 2016-11-13: 1 g via INTRAVENOUS
  Filled 2016-11-13: qty 10

## 2016-11-13 MED ORDER — SODIUM BICARBONATE 650 MG PO TABS
650.0000 mg | ORAL_TABLET | Freq: Two times a day (BID) | ORAL | Status: DC
  Administered 2016-11-13 – 2016-11-18 (×10): 650 mg via ORAL
  Filled 2016-11-13 (×10): qty 1

## 2016-11-13 MED ORDER — DIPHENOXYLATE-ATROPINE 2.5-0.025 MG PO TABS
1.0000 | ORAL_TABLET | Freq: Three times a day (TID) | ORAL | Status: DC
  Administered 2016-11-14 (×2): 1 via ORAL
  Filled 2016-11-13 (×3): qty 1

## 2016-11-13 MED ORDER — LACTULOSE 10 GM/15ML PO SOLN
20.0000 g | Freq: Two times a day (BID) | ORAL | Status: DC
  Administered 2016-11-13 – 2016-11-18 (×9): 20 g via ORAL
  Filled 2016-11-13 (×10): qty 30

## 2016-11-13 MED ORDER — NADOLOL 20 MG PO TABS
20.0000 mg | ORAL_TABLET | Freq: Every day | ORAL | Status: DC
  Filled 2016-11-13: qty 1

## 2016-11-13 MED ORDER — LACTULOSE ENCEPHALOPATHY 10 GM/15ML PO SOLN: 20.0000 g | Freq: Two times a day (BID) | ORAL | Status: DC

## 2016-11-13 MED ORDER — NEOMYCIN SULFATE 500 MG PO TABS: 500.0000 mg | ORAL_TABLET | Freq: Two times a day (BID) | ORAL | Status: DC

## 2016-11-13 MED ORDER — SODIUM CHLORIDE 0.9 % IV BOLUS (SEPSIS)
1000.0000 mL | Freq: Once | INTRAVENOUS | Status: AC
  Administered 2016-11-13: 1000 mL via INTRAVENOUS

## 2016-11-13 NOTE — H&P (Signed)
History and Physical    [name] House [id number] DOB: May 16, 1946 DOA: 11/13/2016   PCP: [name] Lopes, MD Chief Complaint:  Chief Complaint  Patient presents with  . Fever  . Diarrhea    HPI: [name] House is a 71 y.o. male with medical history significant of NASH cirrhosis, gastric bypass, chronic iron def anemia, chronic thrombocytopenia, hepatic encephalopathy.  Patient presents to the ED today with c/o fever and chills and generalized weakness.  Symptoms onset yesterday.  Symptoms persistent.  No abd pain, vomiting, dysuria, or diarrhea worse than normal with lactulose.  ED Course: UA suggestive of UTI.  Patient also has creat of 2.5 (has been running 2.5-3 since July of last year, prior to that was 1.1), NAG metabolic acidosis that appears to be chronically worsening since July of last year.  Review of Systems: As per HPI otherwise 10 point review of systems negative.    Past Medical History:  Diagnosis Date  . Abdominal hernia   . Ascites   . Benign neoplasm of colon   . Cirrhosis ([place])   . Diabetes mellitus, type 2 ([place])    resolved with gastric bypass  . Diverticulosis of colon (without mention of hemorrhage)   . Esophageal varices ([place])   . Hyperlipemia   . Hypertension   . Iron deficiency anemia secondary to blood loss (chronic)   . Kidney stones   . Liver cyst   . NASH (nonalcoholic steatohepatitis)   . Osteoarthritis of hip   . Renal cyst   . Splenomegaly   . Unspecified sleep apnea    Resolved    Past Surgical History:  Procedure Laterality Date  . ANKLE SURGERY     Bil  . COLONOSCOPY  2012  . GASTRIC BYPASS  2005  . KNEE SURGERY     Bil  . POLYPECTOMY    . TOTAL HIP ARTHROPLASTY  2008   right     reports that he has never smoked. He has never used smokeless tobacco. He reports that he does not drink alcohol or use drugs.  Allergies  Allergen Reactions  . Oxycodone Other (See Comments)    Caused him to fall..really sedated    Family  History  Problem Relation Age of Onset  . Heart attack Father     deceased due to MI  . Heart disease Father   . Diabetes Mother     deceased due to complications of DM  . Coronary artery disease Brother   . Colon cancer Neg Hx   . Stomach cancer Neg Hx   . Liver disease Neg Hx       Prior to Admission medications   Medication Sig Start Date End Date Taking? Authorizing Provider  diphenoxylate-atropine (LOMOTIL) 2.5-0.025 MG tablet Take 1 tablet by mouth 2 (two) times daily. Patient taking differently: Take 1 tablet by mouth 3 (three) times daily.  03/28/16  Yes [name] Shipper, MD  furosemide (LASIX) 40 MG tablet Take 1 tablet (40 mg total) by mouth daily. Patient taking differently: Take 20 mg by mouth daily.  12/11/15  Yes [name] Shipper, MD  lactulose, encephalopathy, (CHRONULAC) 10 GM/15ML SOLN Take 30 mLs (20 g total) by mouth 2 (two) times daily. 06/20/16  Yes [name] Shipper, MD  nadolol (CORGARD) 20 MG tablet Take 1 tablet (20 mg total) by mouth daily. 03/28/16  Yes [name] Shipper, MD  neomycin (MYCIFRADIN) 500 MG tablet Take 500 mg by mouth 2 (two) times daily.   Yes  Historical Provider, MD  spironolactone (ALDACTONE) 100 MG tablet Take 1 tablet (100 mg total) by mouth daily. Patient taking differently: Take 25 mg by mouth daily.  12/11/15  Yes [name] Shipper, MD    Physical Exam: Vitals:   11/13/16 1657 11/13/16 1900 11/13/16 1911 11/13/16 2130  BP:  (!) 109/53 (!) 109/53 97/57  Pulse:  79 79 79  Resp:   15 20  Temp: 101.5 F (38.6 C)     TempSrc: Rectal     SpO2:  100% 100% 93%  Weight: 83.9 kg (185 lb)     Height: 6\' 1"  (1.854 m)         Constitutional: NAD, calm, comfortable Eyes: PERRL, lids and conjunctivae normal ENMT: Mucous membranes are moist. Posterior pharynx clear of any exudate or lesions.Normal dentition.  Neck: normal, supple, no masses, no thyromegaly Respiratory: clear to auscultation bilaterally, no wheezing, no crackles. Normal respiratory effort. No  accessory muscle use.  Cardiovascular: Regular rate and rhythm, no murmurs / rubs / gallops. No extremity edema. 2+ pedal pulses. No carotid bruits.  Abdomen: no tenderness, no masses palpated. No hepatosplenomegaly. Bowel sounds positive.  Musculoskeletal: no clubbing / cyanosis. No joint deformity upper and lower extremities. Good ROM, no contractures. Normal muscle tone.  Skin: no rashes, lesions, ulcers. No induration Neurologic: CN 2-12 grossly intact. Sensation intact, DTR normal. Strength 5/5 in all 4.  Psychiatric: Normal judgment and insight. Alert and oriented x 3. Normal mood.    Labs on Admission: I have personally reviewed following labs and imaging studies  CBC:  Recent Labs Lab 11/13/16 1704 11/13/16 1714  WBC 8.2  --   NEUTROABS 8.0*  --   HGB 10.8* 9.5*  HCT 30.7* 28.0*  MCV 88.7  --   PLT 36*  --    Basic Metabolic Panel:  Recent Labs Lab 11/13/16 1704 11/13/16 1714  NA 139 141  K 4.6 4.6  CL 114* 113*  CO2 13*  --   GLUCOSE 123* 117*  BUN 36* 30*  CREATININE 2.45* 2.50*  CALCIUM 8.2*  --    GFR: Estimated Creatinine Clearance: 31.5 mL/min (by C-G formula based on SCr of 2.5 mg/dL (H)). Liver Function Tests:  Recent Labs Lab 11/13/16 1704  AST 118*  ALT <5*  ALKPHOS 175*  BILITOT 3.5*  PROT 5.3*  ALBUMIN 3.0*   No results for input(s): LIPASE, AMYLASE in the last 168 hours. No results for input(s): AMMONIA in the last 168 hours. Coagulation Profile: No results for input(s): INR, PROTIME in the last 168 hours. Cardiac Enzymes: No results for input(s): CKTOTAL, CKMB, CKMBINDEX, TROPONINI in the last 168 hours. BNP (last 3 results) No results for input(s): PROBNP in the last 8760 hours. HbA1C: No results for input(s): HGBA1C in the last 72 hours. CBG: No results for input(s): GLUCAP in the last 168 hours. Lipid Profile: No results for input(s): CHOL, HDL, LDLCALC, TRIG, CHOLHDL, LDLDIRECT in the last 72 hours. Thyroid Function  Tests: No results for input(s): TSH, T4TOTAL, FREET4, T3FREE, THYROIDAB in the last 72 hours. Anemia Panel: No results for input(s): VITAMINB12, FOLATE, FERRITIN, TIBC, IRON, RETICCTPCT in the last 72 hours. Urine analysis:    Component Value Date/Time   COLORURINE AMBER (A) 11/13/2016 1939   APPEARANCEUR HAZY (A) 11/13/2016 1939   LABSPEC 1.011 11/13/2016 1939   PHURINE 5.0 11/13/2016 1939   GLUCOSEU NEGATIVE 11/13/2016 1939   HGBUR LARGE (A) 11/13/2016 1939   BILIRUBINUR NEGATIVE 11/13/2016 [place] NEGATIVE 11/13/2016  [place] 11/13/2016 1939   UROBILINOGEN 1.0 02/14/2015 0058   NITRITE POSITIVE (A) 11/13/2016 1939   LEUKOCYTESUR SMALL (A) 11/13/2016 1939   Sepsis Labs: @LABRCNTIP (procalcitonin:4,lacticidven:4) )No results found for this or any previous visit (from the past 240 hour(s)).   Radiological Exams on Admission: Dg Chest 2 View  Result Date: 11/13/2016 CLINICAL DATA:  Fever and shortness of breath EXAM: CHEST  2 VIEW COMPARISON:  03/10/2008 FINDINGS: The cardio pericardial silhouette is enlarged. There is pulmonary vascular congestion without overt pulmonary edema. Prominent skin folds overlie the lungs bilaterally. The visualized bony structures of the thorax are intact. IMPRESSION: Low volume film with cardiomegaly and vascular congestion. Electronically Signed   By: Misty Stanley M.D.   On: 11/13/2016 17:46    EKG: Independently reviewed.  Assessment/Plan Principal Problem:   Lower urinary tract infectious disease Active Problems:   Fever   Diabetes mellitus type 2, controlled (HCC)   Thrombocytopenia (HCC)   Metabolic acidosis, normal anion gap (NAG)   Liver cirrhosis secondary to NASH (HCC)   CKD (chronic kidney disease) stage 4, GFR 15-29 ml/min (HCC)    1. Fever, likely secondary to UTI, although SBP not entirely excluded - 1. Rocephin 2. Urine culture pending 3. Platelets are 30 so EDP doesn't want to do tap 4. Regardless  rocephin would be drug of choice for SBP empiric coverage as well (although duration of treatment may differ between this and UTI). 5. Will "hydrate" patient by holding his lasix / spironolactone, watch for fluid overload 6. 1L IVF in ED 2. CKD stage 4 - looks like developed in July of last year 1. Consult nephrology in AM 2. Stopping neomycin that he was taking for chronic hepatic insufficiency 3. NAG metabolic acidosis - presumably secondary to #2 1. Will start patient on PO bicarb BID 2. Nephrology to adjust dose 4. Cirrhosis - 1. Check ammonia 2. Continue lactulose 3. Watch for worsening hepatic encephalopathy with stoppage of neomycin 5. DM2 - not needing meds since gastric bypass 6. Thrombocytopenia - chronic, likely due to cirrhosis   DVT prophylaxis: SCDs Code Status: Full Family Communication: No family in room Consults called: None Admission status: Admit to inpatient   Etta Quill DO [facility] Pager [phone] from 7PM-7AM  If 7AM-7PM, please contact the day physician for the patient [email or web address] Password [email or web address]  11/13/2016, 9:33 PM

## 2016-11-13 NOTE — ED Notes (Signed)
ED Provider at bedside. 

## 2016-11-13 NOTE — ED Provider Notes (Signed)
[facility] DEPT Provider Note   CSN: [id number] Arrival date & time: 11/13/16  1601     History   Chief Complaint Chief Complaint  Patient presents with  . Fever  . Diarrhea    HPI [name] House is a 71 y.o. male.  71 yo M with a chief complaints of fevers and chills. The started yesterday. Patient is also felt very weak. Denies cough denies congestion denies abdominal pain vomiting or dysuria. Patient has had some loose stools. He is chronically on lactulose for his liver failure. He feels that he is having about the same amount as normal.   The history is provided by the patient.  Illness  This is a new problem. The current episode started yesterday. The problem occurs constantly. The problem has not changed since onset.Pertinent negatives include no chest pain, no abdominal pain, no headaches and no shortness of breath. Nothing aggravates the symptoms. Nothing relieves the symptoms. He has tried nothing for the symptoms. The treatment provided no relief.    Past Medical History:  Diagnosis Date  . Abdominal hernia   . Ascites   . Benign neoplasm of colon   . Cirrhosis ([place])   . Diabetes mellitus, type 2 ([place])    resolved with gastric bypass  . Diverticulosis of colon (without mention of hemorrhage)   . Esophageal varices ([place])   . Hyperlipemia   . Hypertension   . Iron deficiency anemia secondary to blood loss (chronic)   . Kidney stones   . Liver cyst   . NASH (nonalcoholic steatohepatitis)   . Osteoarthritis of hip   . Renal cyst   . Splenomegaly   . Unspecified sleep apnea    Resolved    Patient Active Problem List   Diagnosis Date Noted  . Metabolic acidosis, normal anion gap (NAG) 11/13/2016  . Liver cirrhosis secondary to NASH ([place]) 11/13/2016  . CKD (chronic kidney disease) stage 4, GFR 15-29 ml/min (HCC) 11/13/2016  . Fever 02/14/2015  . Generalized weakness 02/14/2015  . Lower urinary tract infectious disease 02/14/2015  . Hyperglycemia  02/14/2015  . Diabetes mellitus type 2, controlled ([place]) 02/14/2015  . Thrombocytopenia ([place]) 02/14/2015  . Weakness 02/14/2015  . Urinary tract infectious disease   . Coronary artery disease 06/07/2011  . S/P gastric bypass 06/07/2011  . COLONIC POLYPS 11/20/2007  . DIABETES MELLITUS, TYPE II, CONTROLLED 11/20/2007  . DYSLIPIDEMIA 11/20/2007  . Iron deficiency anemia 11/20/2007  . Essential hypertension 11/20/2007  . INTERNAL HEMORRHOIDS 11/20/2007  . DIVERTICULOSIS, COLON 11/20/2007  . GENERALIZED OSTEOARTHROSIS UNSPECIFIED SITE 11/20/2007  . SLEEP APNEA 11/20/2007  . RENAL CALCULUS, HX OF 11/20/2007    Past Surgical History:  Procedure Laterality Date  . ANKLE SURGERY     Bil  . COLONOSCOPY  2012  . GASTRIC BYPASS  2005  . KNEE SURGERY     Bil  . POLYPECTOMY    . TOTAL HIP ARTHROPLASTY  2008   right       Home Medications    Prior to Admission medications   Medication Sig Start Date End Date Taking? Authorizing Provider  diphenoxylate-atropine (LOMOTIL) 2.5-0.025 MG tablet Take 1 tablet by mouth 2 (two) times daily. Patient taking differently: Take 1 tablet by mouth 3 (three) times daily.  03/28/16  Yes Irene Shipper, MD  furosemide (LASIX) 40 MG tablet Take 1 tablet (40 mg total) by mouth daily. Patient taking differently: Take 20 mg by mouth daily.  12/11/15  Yes Irene Shipper, MD  lactulose, encephalopathy, (CHRONULAC) 10 GM/15ML SOLN Take 30 mLs (20 g total) by mouth 2 (two) times daily. 06/20/16  Yes Irene Shipper, MD  nadolol (CORGARD) 20 MG tablet Take 1 tablet (20 mg total) by mouth daily. 03/28/16  Yes Irene Shipper, MD  neomycin (MYCIFRADIN) 500 MG tablet Take 500 mg by mouth 2 (two) times daily.   Yes Historical Provider, MD  spironolactone (ALDACTONE) 100 MG tablet Take 1 tablet (100 mg total) by mouth daily. Patient taking differently: Take 25 mg by mouth daily.  12/11/15  Yes Irene Shipper, MD    Family History Family History  Problem Relation Age of Onset  .  Heart attack Father     deceased due to MI  . Heart disease Father   . Diabetes Mother     deceased due to complications of DM  . Coronary artery disease Brother   . Colon cancer Neg Hx   . Stomach cancer Neg Hx   . Liver disease Neg Hx     Social History Social History  Substance Use Topics  . Smoking status: Never Smoker  . Smokeless tobacco: Never Used  . Alcohol use No     Comment: none     Allergies   Oxycodone   Review of Systems Review of Systems  Constitutional: Positive for chills and fever.  HENT: Negative for congestion and facial swelling.   Eyes: Negative for discharge and visual disturbance.  Respiratory: Negative for shortness of breath.   Cardiovascular: Positive for leg swelling (not more than baseline). Negative for chest pain and palpitations.  Gastrointestinal: Positive for diarrhea (not more than his baseline). Negative for abdominal pain, nausea and vomiting.  Musculoskeletal: Positive for myalgias. Negative for arthralgias.  Skin: Negative for color change and rash.  Neurological: Negative for tremors, syncope and headaches.  Psychiatric/Behavioral: Negative for confusion and dysphoric mood.     Physical Exam Updated Vital Signs BP (!) 103/47 (BP Location: Right Arm)   Pulse 74   Temp 97.4 F (36.3 C) (Oral)   Resp 20   Ht 6\' 4"  (1.93 m)   Wt 202 lb 9.6 oz (91.9 kg)   SpO2 100%   BMI 24.66 kg/m   Physical Exam  Constitutional: He is oriented to person, place, and time. He appears well-developed and well-nourished.  Hot to touch   HENT:  Head: Normocephalic and atraumatic.  Eyes: EOM are normal. Pupils are equal, round, and reactive to light.  Neck: Normal range of motion. Neck supple. No JVD present.  Cardiovascular: Normal rate and regular rhythm.  Exam reveals no gallop and no friction rub.   No murmur heard. Pulmonary/Chest: No respiratory distress. He has no wheezes.  Abdominal: He exhibits no distension and no mass. There is  no tenderness. There is no rebound and no guarding.  Musculoskeletal: Normal range of motion.  Neurological: He is alert and oriented to person, place, and time.  Skin: No rash noted. No pallor.  Psychiatric: He has a normal mood and affect. His behavior is normal.  Nursing note and vitals reviewed.    ED Treatments / Results  Labs (all labs ordered are listed, but only abnormal results are displayed) Labs Reviewed  CBC WITH DIFFERENTIAL/PLATELET - Abnormal; Notable for the following:       Result Value   RBC 3.46 (*)    Hemoglobin 10.8 (*)    HCT 30.7 (*)    Platelets 36 (*)    Neutro Abs 8.0 (*)  Lymphs Abs 0.1 (*)    All other components within normal limits  COMPREHENSIVE METABOLIC PANEL - Abnormal; Notable for the following:    Chloride 114 (*)    CO2 13 (*)    Glucose, Bld 123 (*)    BUN 36 (*)    Creatinine, Ser 2.45 (*)    Calcium 8.2 (*)    Total Protein 5.3 (*)    Albumin 3.0 (*)    AST 118 (*)    ALT <5 (*)    Alkaline Phosphatase 175 (*)    Total Bilirubin 3.5 (*)    GFR calc non Af Amer 25 (*)    GFR calc Af Amer 29 (*)    All other components within normal limits  URINALYSIS, ROUTINE W REFLEX MICROSCOPIC - Abnormal; Notable for the following:    Color, Urine AMBER (*)    APPearance HAZY (*)    Hgb urine dipstick LARGE (*)    Nitrite POSITIVE (*)    Leukocytes, UA SMALL (*)    Bacteria, UA MANY (*)    Squamous Epithelial / LPF 0-5 (*)    All other components within normal limits  AMMONIA - Abnormal; Notable for the following:    Ammonia 49 (*)    All other components within normal limits  I-STAT CHEM 8, ED - Abnormal; Notable for the following:    Chloride 113 (*)    BUN 30 (*)    Creatinine, Ser 2.50 (*)    Glucose, Bld 117 (*)    Calcium, Ion 1.09 (*)    Hemoglobin 9.5 (*)    HCT 28.0 (*)    All other components within normal limits  URINE CULTURE  CULTURE, BLOOD (ROUTINE X 2)  CULTURE, BLOOD (ROUTINE X 2)  INFLUENZA PANEL BY PCR (TYPE A  & B)  CBC  COMPREHENSIVE METABOLIC PANEL    EKG  EKG Interpretation None       Radiology Dg Chest 2 View  Result Date: 11/13/2016 CLINICAL DATA:  Fever and shortness of breath EXAM: CHEST  2 VIEW COMPARISON:  03/10/2008 FINDINGS: The cardio pericardial silhouette is enlarged. There is pulmonary vascular congestion without overt pulmonary edema. Prominent skin folds overlie the lungs bilaterally. The visualized bony structures of the thorax are intact. IMPRESSION: Low volume film with cardiomegaly and vascular congestion. Electronically Signed   By: Misty Stanley M.D.   On: 11/13/2016 17:46    Procedures Procedures (including critical care time)  Medications Ordered in ED Medications  diphenoxylate-atropine (LOMOTIL) 2.5-0.025 MG per tablet 1 tablet (1 tablet Oral Not Given 11/13/16 2321)  lactulose (CHRONULAC) 10 GM/15ML solution 20 g (20 g Oral Given 11/13/16 2157)  sodium bicarbonate tablet 650 mg (650 mg Oral Given 11/13/16 2158)  nadolol (CORGARD) tablet 20 mg (not administered)  sodium chloride 0.9 % bolus 1,000 mL (0 mLs Intravenous Stopped 11/13/16 1926)  cefTRIAXone (ROCEPHIN) 1 g in dextrose 5 % 50 mL IVPB (0 g Intravenous Stopped 11/13/16 1926)     Initial Impression / Assessment and Plan / ED Course  I have reviewed the triage vital signs and the nursing notes.  Pertinent labs & imaging results that were available during my care of the patient were reviewed by me and considered in my medical decision making (see chart for details).     71 yo M With a chief complaint of subjective fevers and chills. Denies any other infectious complaint. Patient has a history of liver failure with ascites. Found to have UTI, patient feeling  terrible with fever/chills, suspect pyelo.  Will discuss for admission.  The patients results and plan were reviewed and discussed.   Any x-rays performed were independently reviewed by myself.   Differential diagnosis were considered with the presenting  HPI.  Medications  diphenoxylate-atropine (LOMOTIL) 2.5-0.025 MG per tablet 1 tablet (1 tablet Oral Not Given 11/13/16 2321)  lactulose (CHRONULAC) 10 GM/15ML solution 20 g (20 g Oral Given 11/13/16 2157)  sodium bicarbonate tablet 650 mg (650 mg Oral Given 11/13/16 2158)  nadolol (CORGARD) tablet 20 mg (not administered)  sodium chloride 0.9 % bolus 1,000 mL (0 mLs Intravenous Stopped 11/13/16 1926)  cefTRIAXone (ROCEPHIN) 1 g in dextrose 5 % 50 mL IVPB (0 g Intravenous Stopped 11/13/16 1926)    Vitals:   11/13/16 2000 11/13/16 2100 11/13/16 2130 11/13/16 2320  BP: 104/55 108/55 97/57 (!) 103/47  Pulse: 77 84 78 74  Resp:   20 20  Temp:   98.6 F (37 C) 97.4 F (36.3 C)  TempSrc:   Oral Oral  SpO2: 98% 98% 94% 100%  Weight:    202 lb 9.6 oz (91.9 kg)  Height:    6\' 4"  (1.93 m)    Final diagnoses:  Acute cystitis without hematuria    Admission/ observation were discussed with the admitting physician, patient and/or family and they are comfortable with the plan.      Final Clinical Impressions(s) / ED Diagnoses   Final diagnoses:  Acute cystitis without hematuria    New Prescriptions Current Discharge Medication List       Deno Etienne, DO 11/14/16 0002

## 2016-11-13 NOTE — ED Notes (Signed)
PT GIVEN A URINAL FOR A SAMPLE.

## 2016-11-13 NOTE — ED Notes (Signed)
PT GIVEN WATER FOR PO CHALLENGE, AND TOLERATED IT WELL.

## 2016-11-14 DIAGNOSIS — R509 Fever, unspecified: Secondary | ICD-10-CM

## 2016-11-14 DIAGNOSIS — B961 Klebsiella pneumoniae [K. pneumoniae] as the cause of diseases classified elsewhere: Secondary | ICD-10-CM

## 2016-11-14 DIAGNOSIS — N184 Chronic kidney disease, stage 4 (severe): Secondary | ICD-10-CM

## 2016-11-14 DIAGNOSIS — E872 Acidosis: Secondary | ICD-10-CM

## 2016-11-14 DIAGNOSIS — I959 Hypotension, unspecified: Secondary | ICD-10-CM

## 2016-11-14 DIAGNOSIS — D696 Thrombocytopenia, unspecified: Secondary | ICD-10-CM

## 2016-11-14 DIAGNOSIS — R7881 Bacteremia: Secondary | ICD-10-CM

## 2016-11-14 DIAGNOSIS — E119 Type 2 diabetes mellitus without complications: Secondary | ICD-10-CM

## 2016-11-14 DIAGNOSIS — N390 Urinary tract infection, site not specified: Secondary | ICD-10-CM

## 2016-11-14 DIAGNOSIS — K7460 Unspecified cirrhosis of liver: Secondary | ICD-10-CM

## 2016-11-14 DIAGNOSIS — N3000 Acute cystitis without hematuria: Principal | ICD-10-CM

## 2016-11-14 DIAGNOSIS — K7581 Nonalcoholic steatohepatitis (NASH): Secondary | ICD-10-CM

## 2016-11-14 DIAGNOSIS — N179 Acute kidney failure, unspecified: Secondary | ICD-10-CM

## 2016-11-14 LAB — BLOOD CULTURE ID PANEL (REFLEXED)
ACINETOBACTER BAUMANNII: NOT DETECTED
Acinetobacter baumannii: NOT DETECTED
CANDIDA ALBICANS: NOT DETECTED
CANDIDA ALBICANS: NOT DETECTED
CANDIDA GLABRATA: NOT DETECTED
CANDIDA GLABRATA: NOT DETECTED
CANDIDA KRUSEI: NOT DETECTED
CANDIDA PARAPSILOSIS: NOT DETECTED
CANDIDA TROPICALIS: NOT DETECTED
CANDIDA TROPICALIS: NOT DETECTED
Candida krusei: NOT DETECTED
Candida parapsilosis: NOT DETECTED
Carbapenem resistance: NOT DETECTED
Carbapenem resistance: NOT DETECTED
ENTEROBACTER CLOACAE COMPLEX: NOT DETECTED
ENTEROBACTER CLOACAE COMPLEX: NOT DETECTED
ENTEROBACTERIACEAE SPECIES: DETECTED — AB
ENTEROCOCCUS SPECIES: NOT DETECTED
ESCHERICHIA COLI: NOT DETECTED
ESCHERICHIA COLI: NOT DETECTED
Enterobacteriaceae species: DETECTED — AB
Enterococcus species: NOT DETECTED
HAEMOPHILUS INFLUENZAE: NOT DETECTED
Haemophilus influenzae: NOT DETECTED
KLEBSIELLA OXYTOCA: DETECTED — AB
KLEBSIELLA PNEUMONIAE: NOT DETECTED
Klebsiella oxytoca: DETECTED — AB
Klebsiella pneumoniae: NOT DETECTED
LISTERIA MONOCYTOGENES: NOT DETECTED
Listeria monocytogenes: NOT DETECTED
NEISSERIA MENINGITIDIS: NOT DETECTED
Neisseria meningitidis: NOT DETECTED
PROTEUS SPECIES: NOT DETECTED
PROTEUS SPECIES: NOT DETECTED
Pseudomonas aeruginosa: NOT DETECTED
Pseudomonas aeruginosa: NOT DETECTED
STAPHYLOCOCCUS SPECIES: NOT DETECTED
STAPHYLOCOCCUS SPECIES: NOT DETECTED
STREPTOCOCCUS AGALACTIAE: NOT DETECTED
STREPTOCOCCUS PNEUMONIAE: NOT DETECTED
STREPTOCOCCUS SPECIES: NOT DETECTED
Serratia marcescens: NOT DETECTED
Serratia marcescens: NOT DETECTED
Staphylococcus aureus (BCID): NOT DETECTED
Staphylococcus aureus (BCID): NOT DETECTED
Streptococcus agalactiae: NOT DETECTED
Streptococcus pneumoniae: NOT DETECTED
Streptococcus pyogenes: NOT DETECTED
Streptococcus pyogenes: NOT DETECTED
Streptococcus species: NOT DETECTED

## 2016-11-14 LAB — CBC
HEMATOCRIT: 26.4 % — AB (ref 39.0–52.0)
Hemoglobin: 9.4 g/dL — ABNORMAL LOW (ref 13.0–17.0)
MCH: 31.8 pg (ref 26.0–34.0)
MCHC: 35.6 g/dL (ref 30.0–36.0)
MCV: 89.2 fL (ref 78.0–100.0)
Platelets: 27 10*3/uL — CL (ref 150–400)
RBC: 2.96 MIL/uL — AB (ref 4.22–5.81)
RDW: 14.9 % (ref 11.5–15.5)
WBC: 12 10*3/uL — ABNORMAL HIGH (ref 4.0–10.5)

## 2016-11-14 LAB — MRSA PCR SCREENING: MRSA by PCR: NEGATIVE

## 2016-11-14 LAB — COMPREHENSIVE METABOLIC PANEL
ALK PHOS: 106 U/L (ref 38–126)
ALT: 55 U/L (ref 17–63)
AST: 92 U/L — ABNORMAL HIGH (ref 15–41)
Albumin: 2.6 g/dL — ABNORMAL LOW (ref 3.5–5.0)
Anion gap: 12 (ref 5–15)
BILIRUBIN TOTAL: 1.7 mg/dL — AB (ref 0.3–1.2)
BUN: 43 mg/dL — ABNORMAL HIGH (ref 6–20)
CALCIUM: 7.6 mg/dL — AB (ref 8.9–10.3)
CO2: 13 mmol/L — AB (ref 22–32)
CREATININE: 2.46 mg/dL — AB (ref 0.61–1.24)
Chloride: 113 mmol/L — ABNORMAL HIGH (ref 101–111)
GFR, EST AFRICAN AMERICAN: 29 mL/min — AB (ref >60)
GFR, EST NON AFRICAN AMERICAN: 25 mL/min — AB (ref >60)
Glucose, Bld: 120 mg/dL — ABNORMAL HIGH (ref 65–99)
Potassium: 4.9 mmol/L (ref 3.5–5.1)
Sodium: 138 mmol/L (ref 135–145)
TOTAL PROTEIN: 4.6 g/dL — AB (ref 6.5–8.1)

## 2016-11-14 MED ORDER — SODIUM CHLORIDE 0.9 % IV BOLUS (SEPSIS)
500.0000 mL | Freq: Once | INTRAVENOUS | Status: AC
  Administered 2016-11-14: 500 mL via INTRAVENOUS

## 2016-11-14 MED ORDER — DEXTROSE 5 % IV SOLN
2.0000 g | INTRAVENOUS | Status: DC
  Administered 2016-11-14 – 2016-11-16 (×3): 2 g via INTRAVENOUS
  Filled 2016-11-14 (×3): qty 2

## 2016-11-14 NOTE — Progress Notes (Signed)
PHARMACY - PHYSICIAN COMMUNICATION CRITICAL VALUE ALERT - BLOOD CULTURE IDENTIFICATION (BCID)  Results for orders placed or performed during the hospital encounter of 11/13/16  Blood Culture ID Panel (Reflexed) (Collected: 11/13/2016  5:16 PM)  Result Value Ref Range   Enterococcus species NOT DETECTED NOT DETECTED   Listeria monocytogenes NOT DETECTED NOT DETECTED   Staphylococcus species DETECTED (A) NOT DETECTED   Staphylococcus aureus NOT DETECTED NOT DETECTED   Methicillin resistance DETECTED (A) NOT DETECTED   Streptococcus species NOT DETECTED NOT DETECTED   Streptococcus agalactiae NOT DETECTED NOT DETECTED   Streptococcus pneumoniae NOT DETECTED NOT DETECTED   Streptococcus pyogenes NOT DETECTED NOT DETECTED   Acinetobacter baumannii NOT DETECTED NOT DETECTED   Enterobacteriaceae species NOT DETECTED NOT DETECTED   Enterobacter cloacae complex NOT DETECTED NOT DETECTED   Escherichia coli NOT DETECTED NOT DETECTED   Klebsiella oxytoca NOT DETECTED NOT DETECTED   Klebsiella pneumoniae NOT DETECTED NOT DETECTED   Proteus species NOT DETECTED NOT DETECTED   Serratia marcescens NOT DETECTED NOT DETECTED   Haemophilus influenzae NOT DETECTED NOT DETECTED   Neisseria meningitidis NOT DETECTED NOT DETECTED   Pseudomonas aeruginosa NOT DETECTED NOT DETECTED   Candida albicans NOT DETECTED NOT DETECTED   Candida glabrata NOT DETECTED NOT DETECTED   Candida krusei NOT DETECTED NOT DETECTED   Candida parapsilosis NOT DETECTED NOT DETECTED   Candida tropicalis NOT DETECTED NOT DETECTED    Name of physician (or Provider) Contacted: Dr. Teryl Lucy  Changes to prescribed antibiotics required: 1/2 [place] with CNS-- suspects contamination, will not treat  Lynelle Doctor 11/14/2016  4:27 PM

## 2016-11-14 NOTE — Progress Notes (Signed)
PROGRESS NOTE    [name] House  [id number] DOB: 26-Mar-1946 DOA: 11/13/2016 PCP: [name], MD   Brief Narrative: [name] House is a 71 y.o. male with medical history significant of NASH cirrhosis, gastric bypass, chronic iron def anemia, chronic thrombocytopenia, hepatic encephalopathy.   Assessment & Plan:   Principal Problem:   Lower urinary tract infectious disease Active Problems:   Fever   Diabetes mellitus type 2, controlled (HCC)   Thrombocytopenia (HCC)   Metabolic acidosis, normal anion gap (NAG)   Liver cirrhosis secondary to NASH (HCC)   CKD (chronic kidney disease) stage 4, GFR 15-29 ml/min (HCC)   Fever Likely secondary to UTI vs SBP. Paracentesis not performed secondary to thrombocytopenia.  -continue ceftriaxone -urine culture pending  Hypotension New overnight. This adds to concern for SBP. He has had multiple fluid boluses with minimal response -discontinue betablocker -transfer to stepdown for increased vitals monitoring  CKD stage IV Does not follow with nephrology as an outpatient. Creatinine trended down slightly -continue bicarb -nephrology recommendations  Non-anion gap metabolic acidosis -continue bicarb -nephrology recommendations  Cirrhosis Stable. Neomycin discontinued on admission. Ammonia slightly elevated. -continue lactulose  Diabetes mellitus, type 2 Stable. Diet controlled -add sliding scale if blood sugar increases to >150  Thrombocytopenia Chronic. Slightly worsened. No bleeding. -Daily CBC  Essential hypertension -hold beta blocker in setting of hypotension   DVT prophylaxis: SCDs Code Status: Full code Family Communication: None at bedside Disposition Plan: Discharge in several days.   Consultants:   Nephrology  Procedures:   None  Antimicrobials:   Ceftriaxone (2/7>>    Subjective: Patient reports feeling "bad" with low energy. No dyspnea or chest pain. No abdominal pain. Having bowel  movements with last occurring yesterday.  Objective: Vitals:   11/14/16 0707 11/14/16 0800 11/14/16 0930 11/14/16 0937  BP: (!) 87/54 (!) 92/52 (!) 77/48 (!) 80/42  Pulse:      Resp:      Temp:      TempSrc:      SpO2:      Weight:      Height:        Intake/Output Summary (Last 24 hours) at 11/14/16 0939 Last data filed at 11/13/16 1926  Gross per 24 hour  Intake             1050 ml  Output                0 ml  Net             1050 ml   Filed Weights   11/13/16 1657 11/13/16 2320  Weight: 83.9 kg (185 lb) 91.9 kg (202 lb 9.6 oz)    Examination:  General exam: Appears calm and chronically ill Respiratory system: Clear to auscultation. Respiratory effort normal. Cardiovascular system: S1 & S2 heard, RRR. No murmurs, rubs, gallops or clicks. Gastrointestinal system: Abdomen is distended, soft and nontender. Increased bowel sounds heard. Central nervous system: Alert and oriented. No focal neurological deficits. Extremities: No edema. No calf tenderness Skin: No cyanosis. No rashes Psychiatry: Judgement and insight appear normal. Mood & affect depressed and flat.     Data Reviewed: I have personally reviewed following labs and imaging studies  CBC:  Recent Labs Lab 11/13/16 1704 11/13/16 1714 11/14/16 0425  WBC 8.2  --  12.0*  NEUTROABS 8.0*  --   --   HGB 10.8* 9.5* 9.4*  HCT 30.7* 28.0* 26.4*  MCV 88.7  --  89.2  PLT 36*  --  27*   Basic Metabolic Panel:  Recent Labs Lab 11/13/16 1704 11/13/16 1714 11/14/16 0425  NA 139 141 138  K 4.6 4.6 4.9  CL 114* 113* 113*  CO2 13*  --  13*  GLUCOSE 123* 117* 120*  BUN 36* 30* 43*  CREATININE 2.45* 2.50* 2.46*  CALCIUM 8.2*  --  7.6*   GFR: Estimated Creatinine Clearance: 34.8 mL/min (by C-G formula based on SCr of 2.46 mg/dL (H)). Liver Function Tests:  Recent Labs Lab 11/13/16 1704 11/14/16 0425  AST 118* 92*  ALT <5* 55  ALKPHOS 175* 106  BILITOT 3.5* 1.7*  PROT 5.3* 4.6*  ALBUMIN 3.0* 2.6*     No results for input(s): LIPASE, AMYLASE in the last 168 hours.  Recent Labs Lab 11/13/16 2220  AMMONIA 49*   Coagulation Profile: No results for input(s): INR, PROTIME in the last 168 hours. Cardiac Enzymes: No results for input(s): CKTOTAL, CKMB, CKMBINDEX, TROPONINI in the last 168 hours. BNP (last 3 results) No results for input(s): PROBNP in the last 8760 hours. HbA1C: No results for input(s): HGBA1C in the last 72 hours. CBG: No results for input(s): GLUCAP in the last 168 hours. Lipid Profile: No results for input(s): CHOL, HDL, LDLCALC, TRIG, CHOLHDL, LDLDIRECT in the last 72 hours. Thyroid Function Tests: No results for input(s): TSH, T4TOTAL, FREET4, T3FREE, THYROIDAB in the last 72 hours. Anemia Panel: No results for input(s): VITAMINB12, FOLATE, FERRITIN, TIBC, IRON, RETICCTPCT in the last 72 hours. Sepsis Labs: No results for input(s): PROCALCITON, LATICACIDVEN in the last 168 hours.  Recent Results (from the past 240 hour(s))  Blood culture (routine x 2)     Status: None (Preliminary result)   Collection Time: 11/13/16  5:04 PM  Result Value Ref Range Status   Specimen Description BLOOD LEFT ANTECUBITAL  Final   Special Requests BOTTLES DRAWN AEROBIC AND ANAEROBIC 5 CC EA  Final   Culture  Setup Time   Final    Organism ID to follow ANAEROBIC BOTTLE ONLY Performed at [facility], [address] [place], [place] [address]    Culture PENDING  Incomplete   Report Status PENDING  Incomplete      Radiology Studies: Dg Chest 2 View  Result Date: 11/13/2016 CLINICAL DATA:  Fever and shortness of breath EXAM: CHEST  2 VIEW COMPARISON:  03/10/2008 FINDINGS: The cardio pericardial silhouette is enlarged. There is pulmonary vascular congestion without overt pulmonary edema. Prominent skin folds overlie the lungs bilaterally. The visualized bony structures of the thorax are intact. IMPRESSION: Low volume film with cardiomegaly and vascular congestion.  Electronically Signed   By: Misty Stanley M.D.   On: 11/13/2016 17:46      Scheduled Meds: . diphenoxylate-atropine  1 tablet Oral TID  . lactulose  20 g Oral BID  . nadolol  20 mg Oral Daily  . sodium bicarbonate  650 mg Oral BID   Continuous Infusions:   LOS: 1 day     Cordelia Poche [facility] 11/14/2016, 9:39 AM Pager: [phone]  If 7PM-7AM, please contact night-coverage [email or web address] Password [facility] 11/14/2016, 9:39 AM

## 2016-11-14 NOTE — Consult Note (Signed)
Renal Service Consult Note [facility]  [name] House 11/14/2016 [name] House D Requesting Physician:  Dr [name] House  Reason for Consult:  Renal failure HPI: The patient is a 71 y.o. year-old with history of HTN, HL, DM2 and cirrhosis due to NASH, came to ED yesterday with marked fatigue, loose stools and fevers and chills. UA showed possible UTI, admitted for suspected pyelonephritis.  Hx of low plts.  Admitted and started on empiric abx w Rocephin. Give IVF"s and diuretics held.  Known hx of CKD.  Neomycin stopped, on since last year.  Takes lactulose for hep encephalopathy prevention. No diabetic meds since gastric bypass surgery.  Pt is a vague historian.  Not sure if he has a kidney doctor or not.  Thought he had the "flu" that's why he came to ED.    Home meds > lomotil, lasix 20/d, lactulose, Corgard 20/d, neomycin 500 bid, aldactone 100/ d  UA > many bact, amber, large Hb, +nit/ small LE, tntc RBC, neg protein, 6-30 wbc, 0-5 epi CXR 2/7 > CM, vasc congestion WBC 12k  Hb 9.4  Plt 27k Last abc CT Jan 2017 > hepatic cirrhosis, ^'d large esoph varices/ ++ascites, diffuse body wall edema, splenomegaly, recanalization of periumbilical veins c/w portal HTN   Date   Creat  eGFR  2007- May 2017 1.0- 1.3 April 22, 2016  3.2 May 07, 2016  3.04 May 14, 2016  2.69 Nov 13, 2016  2.45  29 Feb 8   2.46  29  ROS  denies CP  no joint pain   no HA  no blurry vision  no rash  no diarrhea  no nausea/ vomiting  no dysuria  no difficulty voiding  no change in urine color    Past Medical History  Past Medical History:  Diagnosis Date  . Abdominal hernia   . Ascites   . Benign neoplasm of colon   . Cirrhosis ([place])   . Diabetes mellitus, type 2 ([place])    resolved with gastric bypass  . Diverticulosis of colon (without mention of hemorrhage)   . Esophageal varices ([place])   . Hyperlipemia   . Hypertension   . Iron deficiency anemia secondary to blood loss (chronic)   . Kidney  stones   . Liver cyst   . NASH (nonalcoholic steatohepatitis)   . Osteoarthritis of hip   . Renal cyst   . Splenomegaly   . Unspecified sleep apnea    Resolved   Past Surgical History  Past Surgical History:  Procedure Laterality Date  . ANKLE SURGERY     Bil  . COLONOSCOPY  2012  . GASTRIC BYPASS  2005  . KNEE SURGERY     Bil  . POLYPECTOMY    . TOTAL HIP ARTHROPLASTY  2008   right   Family History  Family History  Problem Relation Age of Onset  . Heart attack Father     deceased due to MI  . Heart disease Father   . Diabetes Mother     deceased due to complications of DM  . Coronary artery disease Brother   . Colon cancer Neg Hx   . Stomach cancer Neg Hx   . Liver disease Neg Hx    Social History  reports that he has never smoked. He has never used smokeless tobacco. He reports that he does not drink alcohol or use drugs. Allergies  Allergies  Allergen Reactions  . Oxycodone Other (See Comments)    Caused him to  fall..really sedated   Home medications Prior to Admission medications   Medication Sig Start Date End Date Taking? Authorizing Provider  diphenoxylate-atropine (LOMOTIL) 2.5-0.025 MG tablet Take 1 tablet by mouth 2 (two) times daily. Patient taking differently: Take 1 tablet by mouth 3 (three) times daily.  03/28/16  Yes [name] Shipper, MD  furosemide (LASIX) 40 MG tablet Take 1 tablet (40 mg total) by mouth daily. Patient taking differently: Take 20 mg by mouth daily.  12/11/15  Yes [name] Shipper, MD  lactulose, encephalopathy, (CHRONULAC) 10 GM/15ML SOLN Take 30 mLs (20 g total) by mouth 2 (two) times daily. 06/20/16  Yes [name] Shipper, MD  nadolol (CORGARD) 20 MG tablet Take 1 tablet (20 mg total) by mouth daily. 03/28/16  Yes [name] Shipper, MD  neomycin (MYCIFRADIN) 500 MG tablet Take 500 mg by mouth 2 (two) times daily.   Yes Historical Provider, MD  spironolactone (ALDACTONE) 100 MG tablet Take 1 tablet (100 mg total) by mouth daily. Patient taking  differently: Take 25 mg by mouth daily.  12/11/15  Yes [name] Shipper, MD   Liver Function Tests  Recent Labs Lab 11/13/16 1704 11/14/16 0425  AST 118* 92*  ALT <5* 55  ALKPHOS 175* 106  BILITOT 3.5* 1.7*  PROT 5.3* 4.6*  ALBUMIN 3.0* 2.6*   No results for input(s): LIPASE, AMYLASE in the last 168 hours. CBC  Recent Labs Lab 11/13/16 1704 11/13/16 1714 11/14/16 0425  WBC 8.2  --  12.0*  NEUTROABS 8.0*  --   --   HGB 10.8* 9.5* 9.4*  HCT 30.7* 28.0* 26.4*  MCV 88.7  --  89.2  PLT 36*  --  27*   Basic Metabolic Panel  Recent Labs Lab 11/13/16 1704 11/13/16 1714 11/14/16 0425  NA 139 141 138  K 4.6 4.6 4.9  CL 114* 113* 113*  CO2 13*  --  13*  GLUCOSE 123* 117* 120*  BUN 36* 30* 43*  CREATININE 2.45* 2.50* 2.46*  CALCIUM 8.2*  --  7.6*   Iron/TIBC/Ferritin/ %Sat    Component Value Date/Time   IRON 125 04/26/2016 0902   TIBC 243 04/26/2016 0902   FERRITIN 628 (H) 04/26/2016 0902   IRONPCTSAT 52 04/26/2016 0902   IRONPCTSAT 26 01/13/2013 0840    Vitals:   11/14/16 1235 11/14/16 1300 11/14/16 1400 11/14/16 1500  BP:  (!) 83/40 (!) 76/44 (!) 88/60  Pulse:  70 71 75  Resp:  17 18 13   Temp: 97.8 F (36.6 C)     TempSrc: Oral     SpO2:  100% 100% 100%  Weight:      Height:       Exam BP's 80's Gen alert, a bit cachectic No rash, cyanosis or gangrene Sclera anicteric, throat clear  No jvd or bruits Chest clear bilat RRR no MRG Abd soft ntnd no mass or ascites +bs , small hernia lower mid abd GU normal male MS no joint effusions or deformity Ext no LE or UE edema / no wounds or ulcers Neuro is alert, Ox 3 , nf    Assessment: 1. UTI - on IV abx 2. CKD stage IV - eGFR 29.  3. Hypotension - doubt sepsis, prob due to cirrhosis physiology and/or over diuresis 4. Met acidosis - started on po NaHCO3, cont at dc.  5. Cirrhosis - due to NASH 6. Vol - looks euvolemic on exam.     Plan - Renal function at baseline, have arranged appt for  renal f/u and  details are in EPIC D/C segment.  Will sign off.  [name] Splinter MD [employer] pager [phone]   11/14/2016, 3:14 PM

## 2016-11-14 NOTE — Significant Event (Signed)
Rapid Response Event Note  Overview: Time Called: 0516 Arrival Time: 0521 Event Type: Hypotension  Initial Focused Assessment: Pt. Admitted with UTI. Became hypotensive. Alert, warm and dry. NSR rate 70's, sats >96 on room air.    Interventions:NS bolus given and repeated. MAP maintained >/= 62  Plan of Care (if not transferred): V/S q 30 minutes and finish 2nd NS bolus of 539ml.   Event Summary: Name of Physician Notified: Ruthell Rummage NP at 0515    at    Outcome: Stayed in room and stabalized  Event End Time: 0615  Pricilla Riffle

## 2016-11-14 NOTE — Progress Notes (Signed)
PHARMACY - PHYSICIAN COMMUNICATION CRITICAL VALUE ALERT - BLOOD CULTURE IDENTIFICATION (BCID)  Results for orders placed or performed during the hospital encounter of 11/13/16  Blood Culture ID Panel (Reflexed) (Collected: 11/13/2016  5:04 PM)  Result Value Ref Range   Enterococcus species NOT DETECTED NOT DETECTED   Listeria monocytogenes NOT DETECTED NOT DETECTED   Staphylococcus species NOT DETECTED NOT DETECTED   Staphylococcus aureus NOT DETECTED NOT DETECTED   Streptococcus species NOT DETECTED NOT DETECTED   Streptococcus agalactiae NOT DETECTED NOT DETECTED   Streptococcus pneumoniae NOT DETECTED NOT DETECTED   Streptococcus pyogenes NOT DETECTED NOT DETECTED   Acinetobacter baumannii NOT DETECTED NOT DETECTED   Enterobacteriaceae species DETECTED (A) NOT DETECTED   Enterobacter cloacae complex NOT DETECTED NOT DETECTED   Escherichia coli NOT DETECTED NOT DETECTED   Klebsiella oxytoca DETECTED (A) NOT DETECTED   Klebsiella pneumoniae NOT DETECTED NOT DETECTED   Proteus species NOT DETECTED NOT DETECTED   Serratia marcescens NOT DETECTED NOT DETECTED   Carbapenem resistance NOT DETECTED NOT DETECTED   Haemophilus influenzae NOT DETECTED NOT DETECTED   Neisseria meningitidis NOT DETECTED NOT DETECTED   Pseudomonas aeruginosa NOT DETECTED NOT DETECTED   Candida albicans NOT DETECTED NOT DETECTED   Candida glabrata NOT DETECTED NOT DETECTED   Candida krusei NOT DETECTED NOT DETECTED   Candida parapsilosis NOT DETECTED NOT DETECTED   Candida tropicalis NOT DETECTED NOT DETECTED    Name of physician (or Provider) Contacted: Dr. Lonny Prude  Changes to prescribed antibiotics required: start Ceftriaxone 2g IV q24h  Hershal Coria 11/14/2016  11:39 AM

## 2016-11-14 NOTE — Progress Notes (Signed)
Date:  November 14, 2016 Chart reviewed for concurrent status and case management needs. Will continue to follow patient progress. Discharge Planning: following for needs Expected discharge date: [id number] Rhonda Davis, BSN, [place], [place]

## 2016-11-15 LAB — BLOOD CULTURE ID PANEL (REFLEXED)
ACINETOBACTER BAUMANNII: NOT DETECTED
CANDIDA PARAPSILOSIS: NOT DETECTED
CANDIDA TROPICALIS: NOT DETECTED
Candida albicans: NOT DETECTED
Candida glabrata: NOT DETECTED
Candida krusei: NOT DETECTED
Enterobacter cloacae complex: NOT DETECTED
Enterobacteriaceae species: NOT DETECTED
Enterococcus species: NOT DETECTED
Escherichia coli: NOT DETECTED
HAEMOPHILUS INFLUENZAE: NOT DETECTED
KLEBSIELLA OXYTOCA: NOT DETECTED
KLEBSIELLA PNEUMONIAE: NOT DETECTED
Listeria monocytogenes: NOT DETECTED
METHICILLIN RESISTANCE: DETECTED — AB
Neisseria meningitidis: NOT DETECTED
PSEUDOMONAS AERUGINOSA: NOT DETECTED
Proteus species: NOT DETECTED
SERRATIA MARCESCENS: NOT DETECTED
STAPHYLOCOCCUS AUREUS BCID: NOT DETECTED
STAPHYLOCOCCUS SPECIES: DETECTED — AB
Streptococcus agalactiae: NOT DETECTED
Streptococcus pneumoniae: NOT DETECTED
Streptococcus pyogenes: NOT DETECTED
Streptococcus species: NOT DETECTED

## 2016-11-15 LAB — CBC
HEMATOCRIT: 24.6 % — AB (ref 39.0–52.0)
Hemoglobin: 9 g/dL — ABNORMAL LOW (ref 13.0–17.0)
MCH: 32.1 pg (ref 26.0–34.0)
MCHC: 36.6 g/dL — AB (ref 30.0–36.0)
MCV: 87.9 fL (ref 78.0–100.0)
Platelets: 24 10*3/uL — CL (ref 150–400)
RBC: 2.8 MIL/uL — ABNORMAL LOW (ref 4.22–5.81)
RDW: 15 % (ref 11.5–15.5)
WBC: 8.7 10*3/uL (ref 4.0–10.5)

## 2016-11-15 LAB — BASIC METABOLIC PANEL
Anion gap: 7 (ref 5–15)
BUN: 54 mg/dL — AB (ref 6–20)
CO2: 17 mmol/L — ABNORMAL LOW (ref 22–32)
Calcium: 7.3 mg/dL — ABNORMAL LOW (ref 8.9–10.3)
Chloride: 111 mmol/L (ref 101–111)
Creatinine, Ser: 2.07 mg/dL — ABNORMAL HIGH (ref 0.61–1.24)
GFR calc Af Amer: 36 mL/min — ABNORMAL LOW (ref >60)
GFR, EST NON AFRICAN AMERICAN: 31 mL/min — AB (ref >60)
GLUCOSE: 117 mg/dL — AB (ref 65–99)
POTASSIUM: 4.2 mmol/L (ref 3.5–5.1)
Sodium: 135 mmol/L (ref 135–145)

## 2016-11-15 MED ORDER — SODIUM CHLORIDE 0.9 % IV SOLN
INTRAVENOUS | Status: DC
  Administered 2016-11-15 – 2016-11-18 (×4): via INTRAVENOUS

## 2016-11-15 NOTE — Progress Notes (Signed)
PROGRESS NOTE    [name] House  [id number] DOB: 05/27/1946 DOA: 11/13/2016 PCP: [name], MD   Brief Narrative: [name] House is a 71 y.o. male with medical history significant of NASH cirrhosis, gastric bypass, chronic iron def anemia, chronic thrombocytopenia, hepatic encephalopathy. Here with UTI vs SBP and hypotension.   Assessment & Plan:   Principal Problem:   Lower urinary tract infectious disease Active Problems:   Fever   Diabetes mellitus type 2, controlled (HCC)   Thrombocytopenia (HCC)   Metabolic acidosis, normal anion gap (NAG)   Liver cirrhosis secondary to NASH (HCC)   CKD (chronic kidney disease) stage 4, GFR 15-29 ml/min (HCC)   Bacteremia secondary to klebsiella infection UTI -continue ceftriaxone -urine culture pending  Hypotension New overnight. This adds to concern for SBP. He has had multiple fluid boluses with minimal response -discontinue betablocker -transfer to stepdown for increased vitals monitoring  CKD stage IV Does not follow with nephrology as an outpatient. Creatinine trended down -continue bicarb -nephrology recommendations: outpatient follow-up  Non-anion gap metabolic acidosis -continue bicarb -nephrology recommendations: outpatient follow-up  Cirrhosis Stable. Neomycin discontinued on admission. Ammonia slightly elevated. -continue lactulose  Diabetes mellitus, type 2 Stable. Diet controlled -add sliding scale if blood sugar increases to >150  Thrombocytopenia Chronic. Slightly worsened. No bleeding. -Daily CBC  Essential hypertension -hold beta blocker in setting of hypotension   DVT prophylaxis: SCDs Code Status: Full code Family Communication: None at bedside Disposition Plan: Discharge in several days.   Consultants:   Nephrology  Procedures:   None  Antimicrobials:   Ceftriaxone (2/7>>    Subjective: Feeling better today. No chest pain, dyspnea or abdominal pain. No  fevers.  Objective: Vitals:   11/15/16 0400 11/15/16 0715 11/15/16 0755 11/15/16 0800  BP: (!) 99/43 (!) 91/40 (!) 111/48 103/60  Pulse: 64 63 79 77  Resp: 15 13 15 20   Temp:    97.9 F (36.6 C)  TempSrc:    Oral  SpO2: 97% 97% 100% 99%  Weight:      Height:        Intake/Output Summary (Last 24 hours) at 11/15/16 0916 Last data filed at 11/15/16 0800  Gross per 24 hour  Intake           768.75 ml  Output              975 ml  Net          -206.25 ml   Filed Weights   11/13/16 1657 11/13/16 2320  Weight: 83.9 kg (185 lb) 91.9 kg (202 lb 9.6 oz)    Examination:  General exam: Appears calm and chronically ill Respiratory system: Clear to auscultation. Respiratory effort normal. Cardiovascular system: S1 & S2 heard, RRR. No murmurs, rubs, gallops or clicks. Gastrointestinal system: Abdomen is distended, soft and nontender. Normal bowel sounds heard. Central nervous system: Alert and oriented. No focal neurological deficits. Extremities: No edema. No calf tenderness Skin: No cyanosis. No rashes Psychiatry: Judgement and insight appear normal. Mood & affect depressed and flat.     Data Reviewed: I have personally reviewed following labs and imaging studies  CBC:  Recent Labs Lab 11/13/16 1704 11/13/16 1714 11/14/16 0425 11/15/16 0338  WBC 8.2  --  12.0* 8.7  NEUTROABS 8.0*  --   --   --   HGB 10.8* 9.5* 9.4* 9.0*  HCT 30.7* 28.0* 26.4* 24.6*  MCV 88.7  --  89.2 87.9  PLT 36*  --  27* 24*  Basic Metabolic Panel:  Recent Labs Lab 11/13/16 1704 11/13/16 1714 11/14/16 0425 11/15/16 0338  NA 139 141 138 135  K 4.6 4.6 4.9 4.2  CL 114* 113* 113* 111  CO2 13*  --  13* 17*  GLUCOSE 123* 117* 120* 117*  BUN 36* 30* 43* 54*  CREATININE 2.45* 2.50* 2.46* 2.07*  CALCIUM 8.2*  --  7.6* 7.3*   GFR: Estimated Creatinine Clearance: 41.3 mL/min (by C-G formula based on SCr of 2.07 mg/dL (H)). Liver Function Tests:  Recent Labs Lab 11/13/16 1704  11/14/16 0425  AST 118* 92*  ALT <5* 55  ALKPHOS 175* 106  BILITOT 3.5* 1.7*  PROT 5.3* 4.6*  ALBUMIN 3.0* 2.6*   No results for input(s): LIPASE, AMYLASE in the last 168 hours.  Recent Labs Lab 11/13/16 2220  AMMONIA 49*   Coagulation Profile: No results for input(s): INR, PROTIME in the last 168 hours. Cardiac Enzymes: No results for input(s): CKTOTAL, CKMB, CKMBINDEX, TROPONINI in the last 168 hours. BNP (last 3 results) No results for input(s): PROBNP in the last 8760 hours. HbA1C: No results for input(s): HGBA1C in the last 72 hours. CBG: No results for input(s): GLUCAP in the last 168 hours. Lipid Profile: No results for input(s): CHOL, HDL, LDLCALC, TRIG, CHOLHDL, LDLDIRECT in the last 72 hours. Thyroid Function Tests: No results for input(s): TSH, T4TOTAL, FREET4, T3FREE, THYROIDAB in the last 72 hours. Anemia Panel: No results for input(s): VITAMINB12, FOLATE, FERRITIN, TIBC, IRON, RETICCTPCT in the last 72 hours. Sepsis Labs: No results for input(s): PROCALCITON, LATICACIDVEN in the last 168 hours.  Recent Results (from the past 240 hour(s))  Blood culture (routine x 2)     Status: None (Preliminary result)   Collection Time: 11/13/16  5:04 PM  Result Value Ref Range Status   Specimen Description BLOOD LEFT ANTECUBITAL  Final   Special Requests BOTTLES DRAWN AEROBIC AND ANAEROBIC 5 CC EA  Final   Culture  Setup Time   Final    GRAM NEGATIVE RODS IN BOTH AEROBIC AND ANAEROBIC BOTTLES CRITICAL RESULT CALLED TO, READ BACK BY AND VERIFIED WITH: EBurman Foster.D. 11:10 11/14/16 ([email or web address]) Performed at [facility], [place] [address], [place], [place] [address]    Culture GRAM NEGATIVE RODS  Final   Report Status PENDING  Incomplete  Blood Culture ID Panel (Reflexed)     Status: Abnormal   Collection Time: 11/13/16  5:04 PM  Result Value Ref Range Status   Enterococcus species NOT DETECTED NOT DETECTED Final   Listeria monocytogenes NOT DETECTED NOT DETECTED  Final   Staphylococcus species NOT DETECTED NOT DETECTED Final   Staphylococcus aureus NOT DETECTED NOT DETECTED Final   Streptococcus species NOT DETECTED NOT DETECTED Final   Streptococcus agalactiae NOT DETECTED NOT DETECTED Final   Streptococcus pneumoniae NOT DETECTED NOT DETECTED Final   Streptococcus pyogenes NOT DETECTED NOT DETECTED Final   Acinetobacter baumannii NOT DETECTED NOT DETECTED Final   Enterobacteriaceae species DETECTED (A) NOT DETECTED Final    Comment: Enterobacteriaceae represent a large family of gram-negative bacteria, not a single organism. CRITICAL RESULT CALLED TO, READ BACK BY AND VERIFIED WITH: EBurman Foster.D. 11:10 11/14/16 ([email or web address])    Enterobacter cloacae complex NOT DETECTED NOT DETECTED Final   Escherichia coli NOT DETECTED NOT DETECTED Final   Klebsiella oxytoca DETECTED (A) NOT DETECTED Final    Comment: CRITICAL RESULT CALLED TO, READ BACK BY AND VERIFIED WITH: EBurman Foster.D. 11:10 11/14/16 ([email or web address])    Klebsiella  pneumoniae NOT DETECTED NOT DETECTED Final   Proteus species NOT DETECTED NOT DETECTED Final   Serratia marcescens NOT DETECTED NOT DETECTED Final   Carbapenem resistance NOT DETECTED NOT DETECTED Final   Haemophilus influenzae NOT DETECTED NOT DETECTED Final   Neisseria meningitidis NOT DETECTED NOT DETECTED Final   Pseudomonas aeruginosa NOT DETECTED NOT DETECTED Final   Candida albicans NOT DETECTED NOT DETECTED Final   Candida glabrata NOT DETECTED NOT DETECTED Final   Candida krusei NOT DETECTED NOT DETECTED Final   Candida parapsilosis NOT DETECTED NOT DETECTED Final   Candida tropicalis NOT DETECTED NOT DETECTED Final    Comment: Performed at [facility], [place] [address], [place], [place] [address]  Blood culture (routine x 2)     Status: None (Preliminary result)   Collection Time: 11/13/16  5:16 PM  Result Value Ref Range Status   Specimen Description BLOOD RIGHT ANTECUBITAL  Final   Special Requests BOTTLES  DRAWN AEROBIC AND ANAEROBIC 5 CC EACH  Final   Culture  Setup Time   Final    GRAM POSITIVE COCCI IN CLUSTERS ANAEROBIC BOTTLE ONLY CRITICAL RESULT CALLED TO, READ BACK BY AND VERIFIED WITH: A. Pham Pharm.D. 16:20 11/14/16 ([email or web address]) GRAM NEGATIVE RODS AEROBIC BOTTLE ONLY CRITICAL RESULT CALLED TO, READ BACK BY AND VERIFIED WITH: A [facility] PHARMD 1908 11/14/16 A BROWNING Performed at [facility], [address], [place], [place] [address]    Culture GRAM POSITIVE COCCI GRAM NEGATIVE RODS   Final   Report Status PENDING  Incomplete  Blood Culture ID Panel (Reflexed)     Status: Abnormal   Collection Time: 11/13/16  5:16 PM  Result Value Ref Range Status   Enterococcus species NOT DETECTED NOT DETECTED Final   Listeria monocytogenes NOT DETECTED NOT DETECTED Final   Staphylococcus species NOT DETECTED NOT DETECTED Corrected    Comment: CORRECTED ON 02/08 AT 1928: PREVIOUSLY REPORTED AS DETECTED Methicillin (oxacillin) resistant coagulase negative staphylococcus. Possible blood culture contaminant (unless isolated from more than one blood culture draw or clinical case suggests  pathogenicity). No antibiotic treatment is indicated for blood culture contaminants. CRITICAL RESULT CALLED TO, READ BACK BY AND VERIFIED WITH: A. Pham Pharm.D. 16:20 11/14/16 ([email or web address])    Staphylococcus aureus NOT DETECTED NOT DETECTED Final   Streptococcus species NOT DETECTED NOT DETECTED Final   Streptococcus agalactiae NOT DETECTED NOT DETECTED Final   Streptococcus pneumoniae NOT DETECTED NOT DETECTED Final   Streptococcus pyogenes NOT DETECTED NOT DETECTED Final   Acinetobacter baumannii NOT DETECTED NOT DETECTED Final   Enterobacteriaceae species DETECTED (A) NOT DETECTED Corrected    Comment: Enterobacteriaceae represent a large family of gram-negative bacteria, not a single organism. CRITICAL RESULT CALLED TO, READ BACK BY AND VERIFIED WITH: A PHAM PHARMD 1908 11/14/16 A BROWNING CORRECTED ON 02/08 AT 1928:  PREVIOUSLY REPORTED AS NOT DETECTED    Enterobacter cloacae complex NOT DETECTED NOT DETECTED Final   Escherichia coli NOT DETECTED NOT DETECTED Final   Klebsiella oxytoca DETECTED (A) NOT DETECTED Corrected    Comment: CRITICAL RESULT CALLED TO, READ BACK BY AND VERIFIED WITH: A PHAM PHARMD 1908 11/14/16 A BROWNING CORRECTED ON 02/08 AT 1928: PREVIOUSLY REPORTED AS NOT DETECTED    Klebsiella pneumoniae NOT DETECTED NOT DETECTED Final   Proteus species NOT DETECTED NOT DETECTED Final   Serratia marcescens NOT DETECTED NOT DETECTED Final   Carbapenem resistance NOT DETECTED NOT DETECTED Corrected   Haemophilus influenzae NOT DETECTED NOT DETECTED Final   Neisseria meningitidis  NOT DETECTED NOT DETECTED Final   Pseudomonas aeruginosa NOT DETECTED NOT DETECTED Final   Candida albicans NOT DETECTED NOT DETECTED Final   Candida glabrata NOT DETECTED NOT DETECTED Final   Candida krusei NOT DETECTED NOT DETECTED Final   Candida parapsilosis NOT DETECTED NOT DETECTED Final   Candida tropicalis NOT DETECTED NOT DETECTED Final    Comment: Performed at [facility], [place] [address], [place], [place] [address]  Urine culture     Status: Abnormal (Preliminary result)   Collection Time: 11/13/16  7:39 PM  Result Value Ref Range Status   Specimen Description URINE, RANDOM  Final   Special Requests NONE  Final   Culture >=100,000 COLONIES/mL GRAM NEGATIVE RODS (A)  Final   Report Status PENDING  Incomplete  MRSA PCR Screening     Status: None   Collection Time: 11/14/16 10:47 AM  Result Value Ref Range Status   MRSA by PCR NEGATIVE NEGATIVE Final    Comment:        The GeneXpert MRSA Assay (FDA approved for NASAL specimens only), is one component of a comprehensive MRSA colonization surveillance program. It is not intended to diagnose MRSA infection nor to guide or monitor treatment for MRSA infections.       Radiology Studies: Dg Chest 2 View  Result Date: 11/13/2016 CLINICAL  DATA:  Fever and shortness of breath EXAM: CHEST  2 VIEW COMPARISON:  03/10/2008 FINDINGS: The cardio pericardial silhouette is enlarged. There is pulmonary vascular congestion without overt pulmonary edema. Prominent skin folds overlie the lungs bilaterally. The visualized bony structures of the thorax are intact. IMPRESSION: Low volume film with cardiomegaly and vascular congestion. Electronically Signed   By: Misty Stanley M.D.   On: 11/13/2016 17:46      Scheduled Meds: . cefTRIAXone (ROCEPHIN)  IV  2 g Intravenous Q24H  . lactulose  20 g Oral BID  . sodium bicarbonate  650 mg Oral BID   Continuous Infusions: . sodium chloride 75 mL/hr at 11/15/16 0745     LOS: 2 days     Cordelia Poche [facility] 11/15/2016, 9:16 AM Pager: [phone]  If 7PM-7AM, please contact night-coverage [email or web address] Password [email or web address] 11/15/2016, 9:16 AM

## 2016-11-16 LAB — CBC
HCT: 24.6 % — ABNORMAL LOW (ref 39.0–52.0)
Hemoglobin: 8.9 g/dL — ABNORMAL LOW (ref 13.0–17.0)
MCH: 31 pg (ref 26.0–34.0)
MCHC: 36.2 g/dL — ABNORMAL HIGH (ref 30.0–36.0)
MCV: 85.7 fL (ref 78.0–100.0)
Platelets: 30 10*3/uL — ABNORMAL LOW (ref 150–400)
RBC: 2.87 MIL/uL — ABNORMAL LOW (ref 4.22–5.81)
RDW: 14.9 % (ref 11.5–15.5)
WBC: 5.5 10*3/uL (ref 4.0–10.5)

## 2016-11-16 LAB — URINE CULTURE

## 2016-11-16 LAB — GLUCOSE, CAPILLARY: GLUCOSE-CAPILLARY: 119 mg/dL — AB (ref 65–99)

## 2016-11-16 LAB — CULTURE, BLOOD (ROUTINE X 2)

## 2016-11-16 LAB — BASIC METABOLIC PANEL
ANION GAP: 4 — AB (ref 5–15)
BUN: 48 mg/dL — ABNORMAL HIGH (ref 6–20)
CALCIUM: 7.2 mg/dL — AB (ref 8.9–10.3)
CO2: 17 mmol/L — ABNORMAL LOW (ref 22–32)
CREATININE: 1.75 mg/dL — AB (ref 0.61–1.24)
Chloride: 115 mmol/L — ABNORMAL HIGH (ref 101–111)
GFR, EST AFRICAN AMERICAN: 44 mL/min — AB (ref >60)
GFR, EST NON AFRICAN AMERICAN: 38 mL/min — AB (ref >60)
Glucose, Bld: 126 mg/dL — ABNORMAL HIGH (ref 65–99)
Potassium: 3.8 mmol/L (ref 3.5–5.1)
SODIUM: 136 mmol/L (ref 135–145)

## 2016-11-16 MED ORDER — SODIUM CHLORIDE 0.9 % IV BOLUS (SEPSIS)
500.0000 mL | Freq: Once | INTRAVENOUS | Status: DC
Start: 2016-11-16 — End: 2016-11-16

## 2016-11-16 MED ORDER — CEPHALEXIN 500 MG PO CAPS
500.0000 mg | ORAL_CAPSULE | Freq: Two times a day (BID) | ORAL | Status: DC
  Administered 2016-11-17 – 2016-11-18 (×3): 500 mg via ORAL
  Filled 2016-11-16 (×3): qty 1

## 2016-11-16 NOTE — Evaluation (Signed)
Physical Therapy Evaluation Patient Details Name: [name] House MRN: [id number] DOB: [birth year] Today's Date: 11/16/2016   History of Present Illness  [name] House is a 71 y.o. male with medical history significant of NASH cirrhosis, gastric bypass, chronic iron def anemia, chronic thrombocytopenia, hepatic encephalopathy. Here with UTI vs SBP and hypotension.  Clinical Impression  Pt admitted with above diagnosis. Pt currently with functional limitations due to the deficits listed below (see PT Problem List). * Pt will benefit from skilled PT to increase their independence and safety with mobility to allow discharge to the venue listed below.   Pt with limited mobility today d/t fatigue/deconditioning; recommend SNF if pt is agreeable--he is the primary caregiver for his wife who is currently undergoing tx for pancreatic CA--so unsure if he will agree; Pt would not be able to go home and care for his wife in his current state; They do not have any other home support; will continue to follow in acute setting;  VSS this PT session --BP 110/54     Follow Up Recommendations SNF;Home health PT (vs --depending on progress)    Equipment Recommendations  Other (comment) (TBD)    Recommendations for Other Services       Precautions / Restrictions Precautions Precautions: Fall Restrictions Weight Bearing Restrictions: No      Mobility  Bed Mobility Overal bed mobility: Needs Assistance Bed Mobility: Supine to Sit     Supine to sit: Min guard     General bed mobility comments: min/guard to elevate trunk, heavy reliance on rail, incr time, effortful transition  Transfers Overall transfer level: Needs assistance Equipment used: Rolling walker (2 wheeled) Transfers: Sit to/from [facility] Sit to Stand: Min assist Stand pivot transfers: Min assist       General transfer comment: bed<>BSC<>chair, assist to rise and stabilize, cues for stand pivot transfer safety;  initial stand pivot without RW, pt more unsteady, improved with RW/UE support  Ambulation/Gait                Stairs            Wheelchair Mobility    Modified Rankin (Stroke Patients Only)       Balance Overall balance assessment: Needs assistance Sitting-balance support: Feet supported;No upper extremity supported Sitting balance-Leahy Scale: Fair     Standing balance support: Single extremity supported;During functional activity Standing balance-Leahy Scale: Poor                               Pertinent Vitals/Pain Pain Assessment: No/denies pain    Home Living Family/patient expects to be discharged to:: Private residence Living Arrangements: Spouse/significant other Available Help at Discharge: Other (Comment) (no support for pt) Type of Home: House Home Access: Stairs to enter   [employer] of Steps: 1 Home Layout: One level Home Equipment: Cane - single point;Walker - standard Additional Comments: cane belongs to pt wife    Prior Function Level of Independence: Independent         Comments: pt has been caring for his wife who has pancreatic CA, curently undergoing chemo--he does meals/household mgmt, etc     Hand Dominance        Extremity/Trunk Assessment   Upper Extremity Assessment Upper Extremity Assessment: Generalized weakness    Lower Extremity Assessment Lower Extremity Assessment: Generalized weakness       Communication   Communication: No difficulties  Cognition Arousal/Alertness: Awake/alert Behavior During  Therapy: Flat affect;WFL for tasks assessed/performed Overall Cognitive Status: Within Functional Limits for tasks assessed Area of Impairment: Following commands       Following Commands: Follows one step commands with increased time            General Comments      Exercises     Assessment/Plan    PT Assessment Patient needs continued PT services  PT Problem List Decreased  strength;Decreased activity tolerance;Decreased balance;Decreased mobility;Decreased knowledge of use of DME          PT Treatment Interventions DME instruction;Gait training;Functional mobility training;Therapeutic activities;Therapeutic exercise;Patient/family education    PT Goals (Current goals can be found in the Care Plan section)  Acute Rehab PT Goals Patient Stated Goal: none stated PT Goal Formulation: With patient Time For Goal Achievement: 11/30/16 Potential to Achieve Goals: Good    Frequency Min 3X/week   Barriers to discharge        Co-evaluation               End of Session Equipment Utilized During Treatment: Gait belt Activity Tolerance: Patient limited by fatigue Patient left: in chair;with call bell/phone within reach;with chair alarm set Nurse Communication: Mobility status         Time: [id number] PT Time Calculation (min) (ACUTE ONLY): 25 min   Charges:   PT Evaluation $PT Eval Moderate Complexity: 1 Procedure PT Treatments $Therapeutic Activity: 8-22 mins   PT G Codes:        [name] House 08-Dec-2016, 3:53 PM

## 2016-11-16 NOTE — Progress Notes (Signed)
PROGRESS NOTE    [name] House  [id number] DOB: Jul 29, 1946 DOA: 11/13/2016 PCP: [name], MD   Brief Narrative: [name] House is a 71 y.o. male with medical history significant of NASH cirrhosis, gastric bypass, chronic iron def anemia, chronic thrombocytopenia, hepatic encephalopathy. Here with UTI vs SBP and hypotension.   Assessment & Plan:   Principal Problem:   Lower urinary tract infectious disease Active Problems:   Fever   Diabetes mellitus type 2, controlled (HCC)   Thrombocytopenia (HCC)   Metabolic acidosis, normal anion gap (NAG)   Liver cirrhosis secondary to NASH (HCC)   CKD (chronic kidney disease) stage 4, GFR 15-29 ml/min (HCC)   Bacteremia secondary to klebsiella infection UTI -switch to Keflex -discontinue ceftriaxone -PT eval  Hypotension Appears to be improved -discontinue betablocker -orthostatic vitals  CKD stage IV Does not follow with nephrology as an outpatient. Creatinine trended down -continue bicarb -nephrology recommendations: outpatient follow-up  Non-anion gap metabolic acidosis -continue bicarb -nephrology recommendations: outpatient follow-up  Cirrhosis Stable. Neomycin discontinued on admission. Ammonia slightly elevated. -continue lactulose  Diabetes mellitus, type 2 Stable. Diet controlled -add sliding scale if blood sugar increases to >150  Thrombocytopenia Chronic. Stabilized  Essential hypertension -hold beta blocker in setting of hypotension   DVT prophylaxis: SCDs Code Status: Full code Family Communication: None at bedside Disposition Plan: Discharge in several days. Keep in stepdown secondary to hyoptension   Consultants:   Nephrology  Procedures:   None  Antimicrobials:   Ceftriaxone (2/7>>    Subjective: Continues to feel better. No abdominal pain.  Objective: Vitals:   11/16/16 0200 11/16/16 0300 11/16/16 0317 11/16/16 0400  BP: (!) 94/48   (!) 89/52  Pulse: 66 69  68  Resp:  14 16  16   Temp:   97.5 F (36.4 C)   TempSrc:   Axillary   SpO2: 98% 98%  98%  Weight:      Height:        Intake/Output Summary (Last 24 hours) at 11/16/16 0731 Last data filed at 11/16/16 0400  Gross per 24 hour  Intake          1618.75 ml  Output              750 ml  Net           868.75 ml   Filed Weights   11/13/16 1657 11/13/16 2320  Weight: 83.9 kg (185 lb) 91.9 kg (202 lb 9.6 oz)    Examination:  General exam: Appears calm and chronically ill Respiratory system: Clear to auscultation. Respiratory effort normal. Cardiovascular system: S1 & S2 heard, RRR. No murmurs, rubs, gallops or clicks. Gastrointestinal system: Abdomen is distended, soft and nontender. Normal bowel sounds heard. Central nervous system: Alert and oriented. No focal neurological deficits. Extremities: No edema. No calf tenderness Skin: No cyanosis. No rashes Psychiatry: Judgement and insight appear normal. Mood & affect depressed and flat.     Data Reviewed: I have personally reviewed following labs and imaging studies  CBC:  Recent Labs Lab 11/13/16 1704 11/13/16 1714 11/14/16 0425 11/15/16 0338 11/16/16 0324  WBC 8.2  --  12.0* 8.7 5.5  NEUTROABS 8.0*  --   --   --   --   HGB 10.8* 9.5* 9.4* 9.0* 8.9*  HCT 30.7* 28.0* 26.4* 24.6* 24.6*  MCV 88.7  --  89.2 87.9 85.7  PLT 36*  --  27* 24* 30*   Basic Metabolic Panel:  Recent Labs Lab 11/13/16 1704  11/13/16 1714 11/14/16 0425 11/15/16 0338 11/16/16 0324  NA 139 141 138 135 136  K 4.6 4.6 4.9 4.2 3.8  CL 114* 113* 113* 111 115*  CO2 13*  --  13* 17* 17*  GLUCOSE 123* 117* 120* 117* 126*  BUN 36* 30* 43* 54* 48*  CREATININE 2.45* 2.50* 2.46* 2.07* 1.75*  CALCIUM 8.2*  --  7.6* 7.3* 7.2*   GFR: Estimated Creatinine Clearance: 48.9 mL/min (by C-G formula based on SCr of 1.75 mg/dL (H)). Liver Function Tests:  Recent Labs Lab 11/13/16 1704 11/14/16 0425  AST 118* 92*  ALT <5* 55  ALKPHOS 175* 106  BILITOT 3.5* 1.7*    PROT 5.3* 4.6*  ALBUMIN 3.0* 2.6*   No results for input(s): LIPASE, AMYLASE in the last 168 hours.  Recent Labs Lab 11/13/16 2220  AMMONIA 49*   Coagulation Profile: No results for input(s): INR, PROTIME in the last 168 hours. Cardiac Enzymes: No results for input(s): CKTOTAL, CKMB, CKMBINDEX, TROPONINI in the last 168 hours. BNP (last 3 results) No results for input(s): PROBNP in the last 8760 hours. HbA1C: No results for input(s): HGBA1C in the last 72 hours. CBG: No results for input(s): GLUCAP in the last 168 hours. Lipid Profile: No results for input(s): CHOL, HDL, LDLCALC, TRIG, CHOLHDL, LDLDIRECT in the last 72 hours. Thyroid Function Tests: No results for input(s): TSH, T4TOTAL, FREET4, T3FREE, THYROIDAB in the last 72 hours. Anemia Panel: No results for input(s): VITAMINB12, FOLATE, FERRITIN, TIBC, IRON, RETICCTPCT in the last 72 hours. Sepsis Labs: No results for input(s): PROCALCITON, LATICACIDVEN in the last 168 hours.  Recent Results (from the past 240 hour(s))  Blood culture (routine x 2)     Status: Abnormal (Preliminary result)   Collection Time: 11/13/16  5:04 PM  Result Value Ref Range Status   Specimen Description BLOOD LEFT ANTECUBITAL  Final   Special Requests BOTTLES DRAWN AEROBIC AND ANAEROBIC 5 CC EA  Final   Culture  Setup Time   Final    GRAM NEGATIVE RODS IN BOTH AEROBIC AND ANAEROBIC BOTTLES CRITICAL RESULT CALLED TO, READ BACK BY AND VERIFIED WITH: EBurman Foster.D. 11:10 11/14/16 ([email or web address])    Culture (A)  Final    KLEBSIELLA OXYTOCA SUSCEPTIBILITIES TO FOLLOW Performed at [facility], [place] [address], [place], [place] [address]    Report Status PENDING  Incomplete  Blood Culture ID Panel (Reflexed)     Status: Abnormal   Collection Time: 11/13/16  5:04 PM  Result Value Ref Range Status   Enterococcus species NOT DETECTED NOT DETECTED Final   Listeria monocytogenes NOT DETECTED NOT DETECTED Final   Staphylococcus species NOT  DETECTED NOT DETECTED Final   Staphylococcus aureus NOT DETECTED NOT DETECTED Final   Streptococcus species NOT DETECTED NOT DETECTED Final   Streptococcus agalactiae NOT DETECTED NOT DETECTED Final   Streptococcus pneumoniae NOT DETECTED NOT DETECTED Final   Streptococcus pyogenes NOT DETECTED NOT DETECTED Final   Acinetobacter baumannii NOT DETECTED NOT DETECTED Final   Enterobacteriaceae species DETECTED (A) NOT DETECTED Final    Comment: Enterobacteriaceae represent a large family of gram-negative bacteria, not a single organism. CRITICAL RESULT CALLED TO, READ BACK BY AND VERIFIED WITH: EBurman Foster.D. 11:10 11/14/16 (wilsonm)    Enterobacter cloacae complex NOT DETECTED NOT DETECTED Final   Escherichia coli NOT DETECTED NOT DETECTED Final   Klebsiella oxytoca DETECTED (A) NOT DETECTED Final    Comment: CRITICAL RESULT CALLED TO, READ BACK BY AND VERIFIED WITH: E.  Burman Foster.D. 11:10 11/14/16 (wilsonm)    Klebsiella pneumoniae NOT DETECTED NOT DETECTED Final   Proteus species NOT DETECTED NOT DETECTED Final   Serratia marcescens NOT DETECTED NOT DETECTED Final   Carbapenem resistance NOT DETECTED NOT DETECTED Final   Haemophilus influenzae NOT DETECTED NOT DETECTED Final   Neisseria meningitidis NOT DETECTED NOT DETECTED Final   Pseudomonas aeruginosa NOT DETECTED NOT DETECTED Final   Candida albicans NOT DETECTED NOT DETECTED Final   Candida glabrata NOT DETECTED NOT DETECTED Final   Candida krusei NOT DETECTED NOT DETECTED Final   Candida parapsilosis NOT DETECTED NOT DETECTED Final   Candida tropicalis NOT DETECTED NOT DETECTED Final    Comment: Performed at [facility], [place] [address] [place], [place] [address]  Blood culture (routine x 2)     Status: Abnormal (Preliminary result)   Collection Time: 11/13/16  5:16 PM  Result Value Ref Range Status   Specimen Description BLOOD RIGHT ANTECUBITAL  Final   Special Requests BOTTLES DRAWN AEROBIC AND ANAEROBIC 5 CC  EACH  Final   Culture  Setup Time   Final    GRAM POSITIVE COCCI IN CLUSTERS ANAEROBIC BOTTLE ONLY CRITICAL RESULT CALLED TO, READ BACK BY AND VERIFIED WITH: A. Pham Pharm.D. 16:20 11/14/16 (wilsonm) GRAM NEGATIVE RODS AEROBIC BOTTLE ONLY CRITICAL RESULT CALLED TO, READ BACK BY AND VERIFIED WITH: A [facility] PHARMD 1908 11/14/16 A BROWNING Performed at [facility], [address] [place], [place] [address]    Culture (A)  Final    STAPHYLOCOCCUS SPECIES (COAGULASE NEGATIVE) THE SIGNIFICANCE OF ISOLATING THIS ORGANISM FROM A SINGLE SET OF BLOOD CULTURES WHEN MULTIPLE SETS ARE DRAWN IS UNCERTAIN. PLEASE NOTIFY THE MICROBIOLOGY DEPARTMENT WITHIN ONE WEEK IF SPECIATION AND SENSITIVITIES ARE REQUIRED. KLEBSIELLA OXYTOCA    Report Status PENDING  Incomplete  Blood Culture ID Panel (Reflexed)     Status: Abnormal   Collection Time: 11/13/16  5:16 PM  Result Value Ref Range Status   Enterococcus species NOT DETECTED NOT DETECTED Final   Listeria monocytogenes NOT DETECTED NOT DETECTED Final   Staphylococcus species NOT DETECTED NOT DETECTED Corrected    Comment: CORRECTED ON 02/08 AT 1928: PREVIOUSLY REPORTED AS DETECTED Methicillin (oxacillin) resistant coagulase negative staphylococcus. Possible blood culture contaminant (unless isolated from more than one blood culture draw or clinical case suggests  pathogenicity). No antibiotic treatment is indicated for blood culture contaminants. CRITICAL RESULT CALLED TO, READ BACK BY AND VERIFIED WITH: A. Pham Pharm.D. 16:20 11/14/16 (wilsonm)    Staphylococcus aureus NOT DETECTED NOT DETECTED Final   Streptococcus species NOT DETECTED NOT DETECTED Final   Streptococcus agalactiae NOT DETECTED NOT DETECTED Final   Streptococcus pneumoniae NOT DETECTED NOT DETECTED Final   Streptococcus pyogenes NOT DETECTED NOT DETECTED Final   Acinetobacter baumannii NOT DETECTED NOT DETECTED Final   Enterobacteriaceae species DETECTED (A) NOT DETECTED Corrected     Comment: Enterobacteriaceae represent a large family of gram-negative bacteria, not a single organism. CRITICAL RESULT CALLED TO, READ BACK BY AND VERIFIED WITH: A PHAM PHARMD 1908 11/14/16 A BROWNING CORRECTED ON 02/08 AT 1928: PREVIOUSLY REPORTED AS NOT DETECTED    Enterobacter cloacae complex NOT DETECTED NOT DETECTED Final   Escherichia coli NOT DETECTED NOT DETECTED Final   Klebsiella oxytoca DETECTED (A) NOT DETECTED Corrected    Comment: CRITICAL RESULT CALLED TO, READ BACK BY AND VERIFIED WITH: A PHAM PHARMD 1908 11/14/16 A BROWNING CORRECTED ON 02/08 AT 1928: PREVIOUSLY REPORTED AS NOT DETECTED  Klebsiella pneumoniae NOT DETECTED NOT DETECTED Final   Proteus species NOT DETECTED NOT DETECTED Final   Serratia marcescens NOT DETECTED NOT DETECTED Final   Carbapenem resistance NOT DETECTED NOT DETECTED Corrected   Haemophilus influenzae NOT DETECTED NOT DETECTED Final   Neisseria meningitidis NOT DETECTED NOT DETECTED Final   Pseudomonas aeruginosa NOT DETECTED NOT DETECTED Final   Candida albicans NOT DETECTED NOT DETECTED Final   Candida glabrata NOT DETECTED NOT DETECTED Final   Candida krusei NOT DETECTED NOT DETECTED Final   Candida parapsilosis NOT DETECTED NOT DETECTED Final   Candida tropicalis NOT DETECTED NOT DETECTED Final    Comment: Performed at [facility], [place] [address], [place], [place] [address]  Blood Culture ID Panel (Reflexed)     Status: Abnormal   Collection Time: 11/13/16  5:16 PM  Result Value Ref Range Status   Enterococcus species NOT DETECTED NOT DETECTED Final   Listeria monocytogenes NOT DETECTED NOT DETECTED Final   Staphylococcus species DETECTED (A) NOT DETECTED Final    Comment: Methicillin (oxacillin) resistant coagulase negative staphylococcus. Possible blood culture contaminant (unless isolated from more than one blood culture draw or clinical case suggests pathogenicity). No antibiotic treatment is indicated for blood  culture  contaminants. CRITICAL RESULT CALLED TO, READ BACK BY AND VERIFIED WITH: A PHAM,PHARMD AT 1620 11/14/16 BY M WILSON    Staphylococcus aureus NOT DETECTED NOT DETECTED Final   Methicillin resistance DETECTED (A) NOT DETECTED Final    Comment: CRITICAL RESULT CALLED TO, READ BACK BY AND VERIFIED WITH: A PHAM,PHARMD AT 1620 11/14/16 BY M WILSON THIS IS A CORRECTED REPORT. SEE [id number] FOR PREVIOUSLY REPORTED RESULT.    Streptococcus species NOT DETECTED NOT DETECTED Final   Streptococcus agalactiae NOT DETECTED NOT DETECTED Final   Streptococcus pneumoniae NOT DETECTED NOT DETECTED Final   Streptococcus pyogenes NOT DETECTED NOT DETECTED Final   Acinetobacter baumannii NOT DETECTED NOT DETECTED Final   Enterobacteriaceae species NOT DETECTED NOT DETECTED Final   Enterobacter cloacae complex NOT DETECTED NOT DETECTED Final   Escherichia coli NOT DETECTED NOT DETECTED Final   Klebsiella oxytoca NOT DETECTED NOT DETECTED Final   Klebsiella pneumoniae NOT DETECTED NOT DETECTED Final   Proteus species NOT DETECTED NOT DETECTED Final   Serratia marcescens NOT DETECTED NOT DETECTED Final   Haemophilus influenzae NOT DETECTED NOT DETECTED Final   Neisseria meningitidis NOT DETECTED NOT DETECTED Final   Pseudomonas aeruginosa NOT DETECTED NOT DETECTED Final   Candida albicans NOT DETECTED NOT DETECTED Final   Candida glabrata NOT DETECTED NOT DETECTED Final   Candida krusei NOT DETECTED NOT DETECTED Final   Candida parapsilosis NOT DETECTED NOT DETECTED Final   Candida tropicalis NOT DETECTED NOT DETECTED Final    Comment: Performed at [facility], [place] [address], [place], [place] [address]  Urine culture     Status: Abnormal (Preliminary result)   Collection Time: 11/13/16  7:39 PM  Result Value Ref Range Status   Specimen Description URINE, RANDOM  Final   Special Requests NONE  Final   Culture (A)  Final    >=100,000 COLONIES/mL KLEBSIELLA OXYTOCA SUSCEPTIBILITIES TO FOLLOW Performed  at [facility], [place] [address], [place],  [address]    Report Status PENDING  Incomplete  MRSA PCR Screening     Status: None   Collection Time: 11/14/16 10:47 AM  Result Value Ref Range Status   MRSA by PCR NEGATIVE NEGATIVE Final    Comment:  The GeneXpert MRSA Assay (FDA approved for NASAL specimens only), is one component of a comprehensive MRSA colonization surveillance program. It is not intended to diagnose MRSA infection nor to guide or monitor treatment for MRSA infections.       Radiology Studies: No results found.    Scheduled Meds: . cefTRIAXone (ROCEPHIN)  IV  2 g Intravenous Q24H  . lactulose  20 g Oral BID  . sodium bicarbonate  650 mg Oral BID   Continuous Infusions: . sodium chloride 75 mL/hr at 11/16/16 0400     LOS: 3 days     Cordelia Poche [facility] 11/16/2016, 7:31 AM Pager: [phone]  If 7PM-7AM, please contact night-coverage [email or web address] Password [facility] 11/16/2016, 7:31 AM

## 2016-11-16 NOTE — Care Management Important Message (Signed)
Important Message  Patient Details  Name: AMAREON THOMPKINS MRN: [id number] Date of Birth: 17-Nov-1945   Medicare Important Message Given:  Yes (signed copy/sent to scan)    Erenest Rasher, RN 11/16/2016, 4:24 PM

## 2016-11-16 NOTE — Care Management Note (Signed)
Case Management Note  Patient Details  Name: [name] House MRN: [id number] Date of Birth: 14-Dec-1945  Subjective/Objective:   UTI, hypotension                 Action/Plan: Discharge Planning: NCM spoke to pt and offered choice for [facility]. Pt states his wife had [facility] in the past. Contacted [facility] for [facility] PT referral. Contacted attending for [facility] PT orders with F2F. Pt declining SNF rehab. Contacted [facility] DME rep and RW and 3n1 bedside commode delivered to room. Has a friend that can pick him up post dc.   PCP Leanna Battles MD    Expected Discharge Date:              Expected Discharge Plan:  [place]  In-House Referral:  NA  Discharge planning Services  CM Consult  Post Acute Care Choice:  Home Health Choice offered to:  Patient  DME Arranged:  3-N-1, [place] rolling DME Agency:  [place]:  PT [place]:  [place]  Status of Service:  Completed, signed off  If discussed at [place] of Stay Meetings, dates discussed:    Additional Comments:  Erenest Rasher, RN 11/16/2016, 4:16 PM

## 2016-11-17 DIAGNOSIS — A498 Other bacterial infections of unspecified site: Secondary | ICD-10-CM

## 2016-11-17 DIAGNOSIS — N184 Chronic kidney disease, stage 4 (severe): Secondary | ICD-10-CM

## 2016-11-17 DIAGNOSIS — R7881 Bacteremia: Secondary | ICD-10-CM

## 2016-11-17 DIAGNOSIS — N179 Acute kidney failure, unspecified: Secondary | ICD-10-CM

## 2016-11-17 LAB — BASIC METABOLIC PANEL
ANION GAP: 6 (ref 5–15)
BUN: 38 mg/dL — ABNORMAL HIGH (ref 6–20)
CHLORIDE: 114 mmol/L — AB (ref 101–111)
CO2: 17 mmol/L — AB (ref 22–32)
Calcium: 7.5 mg/dL — ABNORMAL LOW (ref 8.9–10.3)
Creatinine, Ser: 1.56 mg/dL — ABNORMAL HIGH (ref 0.61–1.24)
GFR calc Af Amer: 51 mL/min — ABNORMAL LOW (ref >60)
GFR calc non Af Amer: 44 mL/min — ABNORMAL LOW (ref >60)
GLUCOSE: 108 mg/dL — AB (ref 65–99)
POTASSIUM: 3.9 mmol/L (ref 3.5–5.1)
Sodium: 137 mmol/L (ref 135–145)

## 2016-11-17 MED ORDER — CEPHALEXIN 500 MG PO CAPS: 500.0000 mg | ORAL_CAPSULE | Freq: Two times a day (BID) | ORAL | 0 refills | Status: DC

## 2016-11-17 MED ORDER — SODIUM BICARBONATE 650 MG PO TABS: 650.0000 mg | ORAL_TABLET | Freq: Two times a day (BID) | ORAL | 0 refills | Status: DC

## 2016-11-17 NOTE — NC FL2 (Signed)
[place] LEVEL OF CARE SCREENING TOOL     IDENTIFICATION  Patient Name: [name] House Birthdate: 04/28/1946 Sex: male Admission Date (Current Location): 11/13/2016  [facility] and [place] Number:  Herbalist and Address:  [facility],  [place], [place]      Provider Number: [id number]  Attending Physician Name and Address:  Mariel Aloe, MD  Relative Name and Phone Number:       Current Level of Care: Hospital Recommended Level of Care: [place] Prior Approval Number:    Date Approved/Denied:   PASRR Number: [id number]  Discharge Plan: SNF    Current Diagnoses: Patient Active Problem List   Diagnosis Date Noted  . Metabolic acidosis, normal anion gap (NAG) 11/13/2016  . Liver cirrhosis secondary to NASH ([place]) 11/13/2016  . CKD (chronic kidney disease) stage 4, GFR 15-29 ml/min (HCC) 11/13/2016  . Fever 02/14/2015  . Generalized weakness 02/14/2015  . Lower urinary tract infectious disease 02/14/2015  . Hyperglycemia 02/14/2015  . Diabetes mellitus type 2, controlled ([place]) 02/14/2015  . Thrombocytopenia ([place]) 02/14/2015  . Weakness 02/14/2015  . Urinary tract infectious disease   . Coronary artery disease 06/07/2011  . S/P gastric bypass 06/07/2011  . COLONIC POLYPS 11/20/2007  . DIABETES MELLITUS, TYPE II, CONTROLLED 11/20/2007  . DYSLIPIDEMIA 11/20/2007  . Iron deficiency anemia 11/20/2007  . Essential hypertension 11/20/2007  . INTERNAL HEMORRHOIDS 11/20/2007  . DIVERTICULOSIS, COLON 11/20/2007  . GENERALIZED OSTEOARTHROSIS UNSPECIFIED SITE 11/20/2007  . SLEEP APNEA 11/20/2007  . RENAL CALCULUS, HX OF 11/20/2007    Orientation RESPIRATION BLADDER Height & Weight     Self, Time, Situation, Place  Normal Continent Weight: 202 lb 9.6 oz (91.9 kg) Height:  6\' 4"  (193 cm)  BEHAVIORAL SYMPTOMS/MOOD NEUROLOGICAL BOWEL NUTRITION STATUS      Incontinent Diet (2 gram sodium diet/ cardiac)   AMBULATORY STATUS COMMUNICATION OF NEEDS Skin   Extensive Assist Verbally Normal                       Personal Care Assistance Level of Assistance  Bathing, Dressing Bathing Assistance: Maximum assistance   Dressing Assistance: Maximum assistance     Functional Limitations Info             SPECIAL CARE FACTORS FREQUENCY  PT (By licensed PT), OT (By licensed OT)     PT Frequency: 5/wk OT Frequency: 5/wk            Contractures      Additional Factors Info  Code Status               Current Medications (11/17/2016):  This is the current hospital active medication list Current Facility-Administered Medications  Medication Dose Route Frequency Provider Last Rate Last Dose  . 0.9 %  sodium chloride infusion   Intravenous Continuous Mariel Aloe, MD 75 mL/hr at 11/17/16 1300    . cephALEXin (KEFLEX) capsule 500 mg  500 mg Oral Q12H Mariel Aloe, MD   500 mg at 11/17/16 1000  . lactulose (CHRONULAC) 10 GM/15ML solution 20 g  20 g Oral BID Deno Etienne, DO   20 g at 11/17/16 1000  . sodium bicarbonate tablet 650 mg  650 mg Oral BID Etta Quill, DO   650 mg at 11/17/16 1000     Discharge Medications: Please see discharge summary for a list of discharge medications.  Relevant Imaging Results:  Relevant Lab Results:  Additional Information SS#: [id number]  Jorge Ny, LCSW

## 2016-11-17 NOTE — Discharge Summary (Signed)
Physician Discharge Summary  ROGERICK SCHWENT [id number] DOB: [birth year] DOA: 11/13/2016  PCP: [name], MD  Admit date: 11/13/2016 Discharge date: 11/17/2016  Admitted From: Home Disposition: Home  Recommendations for Outpatient Follow-up:  1. Follow up with PCP in 1 week 2. Please obtain CBC to recheck platelets 3. Please obtain BMP to recheck creatinine and renally adjust Keflex as necessary. 4. Nephrology follow-up for CKD 5. Needs follow-up MRI to assess liver and abdominal lesions (patient declined evaluation while admitted) 6. Diuretics held on discharge secondary to hypotension. This will need reevaluation for potential restart.  Home Health: Home health physical therapy Equipment/Devices: 3-in-1, rolling walker  Discharge Condition: Guarded CODE STATUS: Full code Diet recommendation: Heart healthy diet   Brief/Interim Summary:  Admission HPI written by Jennette Kettle, DO   HPI: [name] House is a 71 y.o. male with medical history significant of NASH cirrhosis, gastric bypass, chronic iron def anemia, chronic thrombocytopenia, hepatic encephalopathy.  Patient presents to the ED today with c/o fever and chills and generalized weakness.  Symptoms onset yesterday.  Symptoms persistent.  No abd pain, vomiting, dysuria, or diarrhea worse than normal with lactulose.  ED Course: UA suggestive of UTI.  Patient also has creat of 2.5 (has been running 2.5-3 since July of last year, prior to that was 1.1), NAG metabolic acidosis that appears to be chronically worsening since July of last year.    Hospital course:  Bacteremia secondary to klebsiella infection UTI secondary to klebsiella Patient started on Ceftriaxone empirically. Urine and blood culture resulted positive for klebsiella oxytoca. Sensitivities obtained and patient transitioned to Keflex after telephone discussion with infectious disease. Will treat for a total 10 day course. PT evaluated patient and  recommended SNF. Patient opted for home health with DME.  Hypotension Likely secondary to hypovolemia secondary to dehydration and diuretic therapy. Patient given fluids. Diuretics held. Orthostatic vitals negative. Diuretics and nadolol held on discharge. Follow-up as outpatient.  Acute kidney injury on CKD stage IV Does not follow with nephrology as an outpatient. Likely secondary to dehydration. Creatinine trended down with fluids. Nephrology consulted and will see as an outpatient for CKD  Non-anion gap metabolic acidosis Secondary to chronic kidney disease. Started on bicarbonate and will continue on discharge.   Cirrhosis Stable. Neomycin discontinued on admission. Ammonia slightly elevated. Lactulose continued.  Diabetes mellitus, type 2 Stable. Diet controlled  Thrombocytopenia Chronic. Initially declined but then stabilized. Likely acutely dropped secondary to acute infection.  Essential hypertension Held nadolol in setting of hypotension. Discontinued on discharge.  Liver/abdominal wall lesions CT scan significant for concerning lesions. MRI recommended but patient declined. No clinical evidence of incarceration or strangulation. Will need follow-up as an outpatient.   Discharge Diagnoses:  Principal Problem:   Lower urinary tract infectious disease Active Problems:   Fever   Diabetes mellitus type 2, controlled (HCC)   Thrombocytopenia (HCC)   Metabolic acidosis, normal anion gap (NAG)   Liver cirrhosis secondary to NASH (HCC)   CKD (chronic kidney disease) stage 4, GFR 15-29 ml/min (HCC)    Discharge Instructions   Allergies as of 11/17/2016      Reactions   Oxycodone Other (See Comments)   Caused him to fall..really sedated      Medication List    STOP taking these medications   diphenoxylate-atropine 2.5-0.025 MG tablet Commonly known as:  LOMOTIL   furosemide 40 MG tablet Commonly known as:  LASIX   nadolol 20 MG tablet Commonly known  as:  CORGARD  neomycin 500 MG tablet Commonly known as:  MYCIFRADIN   spironolactone 100 MG tablet Commonly known as:  ALDACTONE     TAKE these medications   cephALEXin 500 MG capsule Commonly known as:  KEFLEX Take 1 capsule (500 mg total) by mouth 2 (two) times daily.   lactulose (encephalopathy) 10 GM/15ML Soln Commonly known as:  CHRONULAC Take 30 mLs (20 g total) by mouth 2 (two) times daily.   sodium bicarbonate 650 MG tablet Take 1 tablet (650 mg total) by mouth 2 (two) times daily.            Durable Medical Equipment        Start     Ordered   11/16/16 1600  For home use only DME 3 n 1  Once     11/16/16 1601   11/16/16 1600  For home use only DME Walker rolling  Once    Question Answer Comment  Patient needs a walker to treat with the following condition UTI (urinary tract infection)   Patient needs a walker to treat with the following condition Hypotension      11/16/16 1601     Follow-up Information    PATEL,JAY K., MD Follow up on 12/09/2016.   Specialty:  Nephrology Why:  Follow up appt with kidney doctor on Monday March 5th at 10:45 am.   Contact information: [place] [address] [phone]        [facility] Follow up.   Why:  Home Health Physical Therapy Contact information: [place] [address] [phone]        [name], MD. Schedule an appointment as soon as possible for a visit in 2 day(s).   Specialty:  Internal Medicine Why:  [place] lab work Contact information: [place] [address] [phone]          Allergies  Allergen Reactions  . Oxycodone Other (See Comments)    Caused him to fall..really sedated    Consultations:  Nephrology  Infectious disease (Telephone)   Procedures/Studies: Dg Chest 2 View  Result Date: 11/13/2016 CLINICAL DATA:  Fever and shortness of breath EXAM: CHEST  2 VIEW COMPARISON:  03/10/2008 FINDINGS:  The cardio pericardial silhouette is enlarged. There is pulmonary vascular congestion without overt pulmonary edema. Prominent skin folds overlie the lungs bilaterally. The visualized bony structures of the thorax are intact. IMPRESSION: Low volume film with cardiomegaly and vascular congestion. Electronically Signed   By: Misty Stanley M.D.   On: 11/13/2016 17:46       Subjective: Patient reports no nausea, vomiting or chest pain. No abdominal pain.  Discharge Exam: Vitals:   11/17/16 1200 11/17/16 1300  BP: (!) 114/57   Pulse: 80 91  Resp: 16 (!) 23  Temp:     Vitals:   11/17/16 1000 11/17/16 1100 11/17/16 1200 11/17/16 1300  BP: 125/70  (!) 114/57   Pulse: 79 79 80 91  Resp: 18 16 16  (!) 23  Temp:      TempSrc:      SpO2: 97% 97% 97% 98%  Weight:      Height:        General exam: Appears calm and chronically ill Respiratory system: Clear to auscultation. Respiratory effort normal. Cardiovascular system: S1 & S2 heard, RRR. No murmurs, rubs, gallops or clicks. Gastrointestinal system: Abdomen is distended, soft and nontender. Normal bowel sounds heard. Central nervous system: Alert and oriented. No focal neurological deficits. Extremities:  No edema. No calf tenderness Skin: No cyanosis. No rashes Psychiatry: Judgement and insight appear normal. Mood & affect depressed and flat.   The results of significant diagnostics from this hospitalization (including imaging, microbiology, ancillary and laboratory) are listed below for reference.     Microbiology: Recent Results (from the past 240 hour(s))  Blood culture (routine x 2)     Status: Abnormal   Collection Time: 11/13/16  5:04 PM  Result Value Ref Range Status   Specimen Description BLOOD LEFT ANTECUBITAL  Final   Special Requests BOTTLES DRAWN AEROBIC AND ANAEROBIC 5 CC EA  Final   Culture  Setup Time   Final    GRAM NEGATIVE RODS IN BOTH AEROBIC AND ANAEROBIC BOTTLES CRITICAL RESULT CALLED TO, READ BACK BY AND  VERIFIED WITH: EBurman Foster.D. 11:10 11/14/16 ([email or web address]) Performed at [facility], [place] [address], [place], [place] [address]    Culture KLEBSIELLA OXYTOCA (A)  Final   Report Status 11/16/2016 FINAL  Final   Organism ID, Bacteria KLEBSIELLA OXYTOCA  Final      Susceptibility   Klebsiella oxytoca - MIC*    AMPICILLIN RESISTANT Resistant     CEFAZOLIN 8 SENSITIVE Sensitive     CEFEPIME <=1 SENSITIVE Sensitive     CEFTAZIDIME <=1 SENSITIVE Sensitive     CEFTRIAXONE <=1 SENSITIVE Sensitive     CIPROFLOXACIN <=0.25 SENSITIVE Sensitive     GENTAMICIN <=1 SENSITIVE Sensitive     IMIPENEM <=0.25 SENSITIVE Sensitive     TRIMETH/SULFA <=20 SENSITIVE Sensitive     AMPICILLIN/SULBACTAM 4 SENSITIVE Sensitive     PIP/TAZO <=4 SENSITIVE Sensitive     Extended ESBL NEGATIVE Sensitive     * KLEBSIELLA OXYTOCA  Blood Culture ID Panel (Reflexed)     Status: Abnormal   Collection Time: 11/13/16  5:04 PM  Result Value Ref Range Status   Enterococcus species NOT DETECTED NOT DETECTED Final   Listeria monocytogenes NOT DETECTED NOT DETECTED Final   Staphylococcus species NOT DETECTED NOT DETECTED Final   Staphylococcus aureus NOT DETECTED NOT DETECTED Final   Streptococcus species NOT DETECTED NOT DETECTED Final   Streptococcus agalactiae NOT DETECTED NOT DETECTED Final   Streptococcus pneumoniae NOT DETECTED NOT DETECTED Final   Streptococcus pyogenes NOT DETECTED NOT DETECTED Final   Acinetobacter baumannii NOT DETECTED NOT DETECTED Final   Enterobacteriaceae species DETECTED (A) NOT DETECTED Final    Comment: Enterobacteriaceae represent a large family of gram-negative bacteria, not a single organism. CRITICAL RESULT CALLED TO, READ BACK BY AND VERIFIED WITH: EBurman Foster.D. 11:10 11/14/16 (wilsonm)    Enterobacter cloacae complex NOT DETECTED NOT DETECTED Final   Escherichia coli NOT DETECTED NOT DETECTED Final   Klebsiella oxytoca DETECTED (A) NOT DETECTED Final    Comment: CRITICAL  RESULT CALLED TO, READ BACK BY AND VERIFIED WITH: EBurman Foster.D. 11:10 11/14/16 (wilsonm)    Klebsiella pneumoniae NOT DETECTED NOT DETECTED Final   Proteus species NOT DETECTED NOT DETECTED Final   Serratia marcescens NOT DETECTED NOT DETECTED Final   Carbapenem resistance NOT DETECTED NOT DETECTED Final   Haemophilus influenzae NOT DETECTED NOT DETECTED Final   Neisseria meningitidis NOT DETECTED NOT DETECTED Final   Pseudomonas aeruginosa NOT DETECTED NOT DETECTED Final   Candida albicans NOT DETECTED NOT DETECTED Final   Candida glabrata NOT DETECTED NOT DETECTED Final   Candida krusei NOT DETECTED NOT DETECTED Final   Candida parapsilosis NOT DETECTED NOT DETECTED Final   Candida tropicalis NOT DETECTED NOT DETECTED  Final    Comment: Performed at [facility], [place] [address], [place], [place] [address]  Blood culture (routine x 2)     Status: Abnormal   Collection Time: 11/13/16  5:16 PM  Result Value Ref Range Status   Specimen Description BLOOD RIGHT ANTECUBITAL  Final   Special Requests BOTTLES DRAWN AEROBIC AND ANAEROBIC 5 CC EACH  Final   Culture  Setup Time   Final    GRAM POSITIVE COCCI IN CLUSTERS ANAEROBIC BOTTLE ONLY CRITICAL RESULT CALLED TO, READ BACK BY AND VERIFIED WITH: A. Pham Pharm.D. 16:20 11/14/16 (wilsonm) GRAM NEGATIVE RODS AEROBIC BOTTLE ONLY CRITICAL RESULT CALLED TO, READ BACK BY AND VERIFIED WITH: A PHAM PHARMD 1908 11/14/16 A BROWNING    Culture (A)  Final    STAPHYLOCOCCUS SPECIES (COAGULASE NEGATIVE) THE SIGNIFICANCE OF ISOLATING THIS ORGANISM FROM A SINGLE SET OF BLOOD CULTURES WHEN MULTIPLE SETS ARE DRAWN IS UNCERTAIN. PLEASE NOTIFY THE MICROBIOLOGY DEPARTMENT WITHIN ONE WEEK IF SPECIATION AND SENSITIVITIES ARE REQUIRED. KLEBSIELLA OXYTOCA SUSCEPTIBILITIES PERFORMED ON PREVIOUS CULTURE WITHIN THE LAST 5 DAYS. Performed at [facility], [place] [address] [place], [place] [address]    Report Status 11/16/2016 FINAL  Final  Blood Culture  ID Panel (Reflexed)     Status: Abnormal   Collection Time: 11/13/16  5:16 PM  Result Value Ref Range Status   Enterococcus species NOT DETECTED NOT DETECTED Final   Listeria monocytogenes NOT DETECTED NOT DETECTED Final   Staphylococcus species NOT DETECTED NOT DETECTED Corrected    Comment: CORRECTED ON 02/08 AT 1928: PREVIOUSLY REPORTED AS DETECTED Methicillin (oxacillin) resistant coagulase negative staphylococcus. Possible blood culture contaminant (unless isolated from more than one blood culture draw or clinical case suggests  pathogenicity). No antibiotic treatment is indicated for blood culture contaminants. CRITICAL RESULT CALLED TO, READ BACK BY AND VERIFIED WITH: A. Pham Pharm.D. 16:20 11/14/16 (wilsonm)    Staphylococcus aureus NOT DETECTED NOT DETECTED Final   Streptococcus species NOT DETECTED NOT DETECTED Final   Streptococcus agalactiae NOT DETECTED NOT DETECTED Final   Streptococcus pneumoniae NOT DETECTED NOT DETECTED Final   Streptococcus pyogenes NOT DETECTED NOT DETECTED Final   Acinetobacter baumannii NOT DETECTED NOT DETECTED Final   Enterobacteriaceae species DETECTED (A) NOT DETECTED Corrected    Comment: Enterobacteriaceae represent a large family of gram-negative bacteria, not a single organism. CRITICAL RESULT CALLED TO, READ BACK BY AND VERIFIED WITH: A PHAM PHARMD 1908 11/14/16 A BROWNING CORRECTED ON 02/08 AT 1928: PREVIOUSLY REPORTED AS NOT DETECTED    Enterobacter cloacae complex NOT DETECTED NOT DETECTED Final   Escherichia coli NOT DETECTED NOT DETECTED Final   Klebsiella oxytoca DETECTED (A) NOT DETECTED Corrected    Comment: CRITICAL RESULT CALLED TO, READ BACK BY AND VERIFIED WITH: A PHAM PHARMD 1908 11/14/16 A BROWNING CORRECTED ON 02/08 AT 1928: PREVIOUSLY REPORTED AS NOT DETECTED    Klebsiella pneumoniae NOT DETECTED NOT DETECTED Final   Proteus species NOT DETECTED NOT DETECTED Final   Serratia marcescens NOT DETECTED NOT DETECTED Final   Carbapenem  resistance NOT DETECTED NOT DETECTED Corrected   Haemophilus influenzae NOT DETECTED NOT DETECTED Final   Neisseria meningitidis NOT DETECTED NOT DETECTED Final   Pseudomonas aeruginosa NOT DETECTED NOT DETECTED Final   Candida albicans NOT DETECTED NOT DETECTED Final   Candida glabrata NOT DETECTED NOT DETECTED Final   Candida krusei NOT DETECTED NOT DETECTED Final   Candida parapsilosis NOT DETECTED NOT DETECTED Final   Candida tropicalis NOT DETECTED NOT  DETECTED Final    Comment: Performed at [facility], [place] [address], [place], [place] [address]  Blood Culture ID Panel (Reflexed)     Status: Abnormal   Collection Time: 11/13/16  5:16 PM  Result Value Ref Range Status   Enterococcus species NOT DETECTED NOT DETECTED Final   Listeria monocytogenes NOT DETECTED NOT DETECTED Final   Staphylococcus species DETECTED (A) NOT DETECTED Final    Comment: Methicillin (oxacillin) resistant coagulase negative staphylococcus. Possible blood culture contaminant (unless isolated from more than one blood culture draw or clinical case suggests pathogenicity). No antibiotic treatment is indicated for blood  culture contaminants. CRITICAL RESULT CALLED TO, READ BACK BY AND VERIFIED WITH: A PHAM,PHARMD AT 1620 11/14/16 BY M WILSON    Staphylococcus aureus NOT DETECTED NOT DETECTED Final   Methicillin resistance DETECTED (A) NOT DETECTED Final    Comment: CRITICAL RESULT CALLED TO, READ BACK BY AND VERIFIED WITH: A PHAM,PHARMD AT 1620 11/14/16 BY M WILSON THIS IS A CORRECTED REPORT. SEE [id number] FOR PREVIOUSLY REPORTED RESULT.    Streptococcus species NOT DETECTED NOT DETECTED Final   Streptococcus agalactiae NOT DETECTED NOT DETECTED Final   Streptococcus pneumoniae NOT DETECTED NOT DETECTED Final   Streptococcus pyogenes NOT DETECTED NOT DETECTED Final   Acinetobacter baumannii NOT DETECTED NOT DETECTED Final   Enterobacteriaceae species NOT DETECTED NOT DETECTED Final   Enterobacter cloacae  complex NOT DETECTED NOT DETECTED Final   Escherichia coli NOT DETECTED NOT DETECTED Final   Klebsiella oxytoca NOT DETECTED NOT DETECTED Final   Klebsiella pneumoniae NOT DETECTED NOT DETECTED Final   Proteus species NOT DETECTED NOT DETECTED Final   Serratia marcescens NOT DETECTED NOT DETECTED Final   Haemophilus influenzae NOT DETECTED NOT DETECTED Final   Neisseria meningitidis NOT DETECTED NOT DETECTED Final   Pseudomonas aeruginosa NOT DETECTED NOT DETECTED Final   Candida albicans NOT DETECTED NOT DETECTED Final   Candida glabrata NOT DETECTED NOT DETECTED Final   Candida krusei NOT DETECTED NOT DETECTED Final   Candida parapsilosis NOT DETECTED NOT DETECTED Final   Candida tropicalis NOT DETECTED NOT DETECTED Final    Comment: Performed at [facility], [place] [address] [place], [place] [address]  Urine culture     Status: Abnormal   Collection Time: 11/13/16  7:39 PM  Result Value Ref Range Status   Specimen Description URINE, RANDOM  Final   Special Requests NONE  Final   Culture >=100,000 COLONIES/mL KLEBSIELLA OXYTOCA (A)  Final   Report Status 11/16/2016 FINAL  Final   Organism ID, Bacteria KLEBSIELLA OXYTOCA (A)  Final      Susceptibility   Klebsiella oxytoca - MIC*    AMPICILLIN RESISTANT Resistant     CEFAZOLIN 8 SENSITIVE Sensitive     CEFTRIAXONE <=1 SENSITIVE Sensitive     CIPROFLOXACIN <=0.25 SENSITIVE Sensitive     GENTAMICIN <=1 SENSITIVE Sensitive     IMIPENEM <=0.25 SENSITIVE Sensitive     NITROFURANTOIN 32 SENSITIVE Sensitive     TRIMETH/SULFA <=20 SENSITIVE Sensitive     AMPICILLIN/SULBACTAM 8 SENSITIVE Sensitive     PIP/TAZO <=4 SENSITIVE Sensitive     Extended ESBL NEGATIVE Sensitive     * >=100,000 COLONIES/mL KLEBSIELLA OXYTOCA  MRSA PCR Screening     Status: None   Collection Time: 11/14/16 10:47 AM  Result Value Ref Range Status   MRSA by PCR NEGATIVE NEGATIVE Final    Comment:        The GeneXpert MRSA Assay (FDA  approved for NASAL  specimens only), is one component of a comprehensive MRSA colonization surveillance program. It is not intended to diagnose MRSA infection nor to guide or monitor treatment for MRSA infections.      Labs: BNP (last 3 results) No results for input(s): BNP in the last 8760 hours. Basic Metabolic Panel:  Recent Labs Lab 11/13/16 1704 11/13/16 1714 11/14/16 0425 11/15/16 0338 11/16/16 0324 11/17/16 0809  NA 139 141 138 135 136 137  K 4.6 4.6 4.9 4.2 3.8 3.9  CL 114* 113* 113* 111 115* 114*  CO2 13*  --  13* 17* 17* 17*  GLUCOSE 123* 117* 120* 117* 126* 108*  BUN 36* 30* 43* 54* 48* 38*  CREATININE 2.45* 2.50* 2.46* 2.07* 1.75* 1.56*  CALCIUM 8.2*  --  7.6* 7.3* 7.2* 7.5*   Liver Function Tests:  Recent Labs Lab 11/13/16 1704 11/14/16 0425  AST 118* 92*  ALT <5* 55  ALKPHOS 175* 106  BILITOT 3.5* 1.7*  PROT 5.3* 4.6*  ALBUMIN 3.0* 2.6*   No results for input(s): LIPASE, AMYLASE in the last 168 hours.  Recent Labs Lab 11/13/16 2220  AMMONIA 49*   CBC:  Recent Labs Lab 11/13/16 1704 11/13/16 1714 11/14/16 0425 11/15/16 0338 11/16/16 0324  WBC 8.2  --  12.0* 8.7 5.5  NEUTROABS 8.0*  --   --   --   --   HGB 10.8* 9.5* 9.4* 9.0* 8.9*  HCT 30.7* 28.0* 26.4* 24.6* 24.6*  MCV 88.7  --  89.2 87.9 85.7  PLT 36*  --  27* 24* 30*   Cardiac Enzymes: No results for input(s): CKTOTAL, CKMB, CKMBINDEX, TROPONINI in the last 168 hours. BNP: Invalid input(s): POCBNP CBG:  Recent Labs Lab 11/16/16 1342  GLUCAP 119*   D-Dimer No results for input(s): DDIMER in the last 72 hours. Hgb A1c No results for input(s): HGBA1C in the last 72 hours. Lipid Profile No results for input(s): CHOL, HDL, LDLCALC, TRIG, CHOLHDL, LDLDIRECT in the last 72 hours. Thyroid function studies No results for input(s): TSH, T4TOTAL, T3FREE, THYROIDAB in the last 72 hours.  Invalid input(s): FREET3 Anemia work up No results for input(s): VITAMINB12, FOLATE, FERRITIN, TIBC, IRON,  RETICCTPCT in the last 72 hours. Urinalysis    Component Value Date/Time   COLORURINE AMBER (A) 11/13/2016 1939   APPEARANCEUR HAZY (A) 11/13/2016 1939   LABSPEC 1.011 11/13/2016 1939   PHURINE 5.0 11/13/2016 1939   GLUCOSEU NEGATIVE 11/13/2016 1939   HGBUR LARGE (A) 11/13/2016 1939   BILIRUBINUR NEGATIVE 11/13/2016 1939   KETONESUR NEGATIVE 11/13/2016 1939   PROTEINUR NEGATIVE 11/13/2016 1939   UROBILINOGEN 1.0 02/14/2015 0058   NITRITE POSITIVE (A) 11/13/2016 1939   LEUKOCYTESUR SMALL (A) 11/13/2016 1939   Sepsis Labs Invalid input(s): PROCALCITONIN,  WBC,  LACTICIDVEN Microbiology Recent Results (from the past 240 hour(s))  Blood culture (routine x 2)     Status: Abnormal   Collection Time: 11/13/16  5:04 PM  Result Value Ref Range Status   Specimen Description BLOOD LEFT ANTECUBITAL  Final   Special Requests BOTTLES DRAWN AEROBIC AND ANAEROBIC 5 CC EA  Final   Culture  Setup Time   Final    GRAM NEGATIVE RODS IN BOTH AEROBIC AND ANAEROBIC BOTTLES CRITICAL RESULT CALLED TO, READ BACK BY AND VERIFIED WITH: EBurman Foster.D. 11:10 11/14/16 (wilsonm) Performed at [facility], [place] [address], [place], [place] [address]    Culture KLEBSIELLA OXYTOCA (A)  Final   Report Status 11/16/2016  FINAL  Final   Organism ID, Bacteria KLEBSIELLA OXYTOCA  Final      Susceptibility   Klebsiella oxytoca - MIC*    AMPICILLIN RESISTANT Resistant     CEFAZOLIN 8 SENSITIVE Sensitive     CEFEPIME <=1 SENSITIVE Sensitive     CEFTAZIDIME <=1 SENSITIVE Sensitive     CEFTRIAXONE <=1 SENSITIVE Sensitive     CIPROFLOXACIN <=0.25 SENSITIVE Sensitive     GENTAMICIN <=1 SENSITIVE Sensitive     IMIPENEM <=0.25 SENSITIVE Sensitive     TRIMETH/SULFA <=20 SENSITIVE Sensitive     AMPICILLIN/SULBACTAM 4 SENSITIVE Sensitive     PIP/TAZO <=4 SENSITIVE Sensitive     Extended ESBL NEGATIVE Sensitive     * KLEBSIELLA OXYTOCA  Blood Culture ID Panel (Reflexed)     Status: Abnormal   Collection Time:  11/13/16  5:04 PM  Result Value Ref Range Status   Enterococcus species NOT DETECTED NOT DETECTED Final   Listeria monocytogenes NOT DETECTED NOT DETECTED Final   Staphylococcus species NOT DETECTED NOT DETECTED Final   Staphylococcus aureus NOT DETECTED NOT DETECTED Final   Streptococcus species NOT DETECTED NOT DETECTED Final   Streptococcus agalactiae NOT DETECTED NOT DETECTED Final   Streptococcus pneumoniae NOT DETECTED NOT DETECTED Final   Streptococcus pyogenes NOT DETECTED NOT DETECTED Final   Acinetobacter baumannii NOT DETECTED NOT DETECTED Final   Enterobacteriaceae species DETECTED (A) NOT DETECTED Final    Comment: Enterobacteriaceae represent a large family of gram-negative bacteria, not a single organism. CRITICAL RESULT CALLED TO, READ BACK BY AND VERIFIED WITH: EBurman Foster.D. 11:10 11/14/16 (wilsonm)    Enterobacter cloacae complex NOT DETECTED NOT DETECTED Final   Escherichia coli NOT DETECTED NOT DETECTED Final   Klebsiella oxytoca DETECTED (A) NOT DETECTED Final    Comment: CRITICAL RESULT CALLED TO, READ BACK BY AND VERIFIED WITH: EBurman Foster.D. 11:10 11/14/16 (wilsonm)    Klebsiella pneumoniae NOT DETECTED NOT DETECTED Final   Proteus species NOT DETECTED NOT DETECTED Final   Serratia marcescens NOT DETECTED NOT DETECTED Final   Carbapenem resistance NOT DETECTED NOT DETECTED Final   Haemophilus influenzae NOT DETECTED NOT DETECTED Final   Neisseria meningitidis NOT DETECTED NOT DETECTED Final   Pseudomonas aeruginosa NOT DETECTED NOT DETECTED Final   Candida albicans NOT DETECTED NOT DETECTED Final   Candida glabrata NOT DETECTED NOT DETECTED Final   Candida krusei NOT DETECTED NOT DETECTED Final   Candida parapsilosis NOT DETECTED NOT DETECTED Final   Candida tropicalis NOT DETECTED NOT DETECTED Final    Comment: Performed at [facility], [place] [address] [place], [place] [address]  Blood culture (routine x 2)     Status: Abnormal   Collection  Time: 11/13/16  5:16 PM  Result Value Ref Range Status   Specimen Description BLOOD RIGHT ANTECUBITAL  Final   Special Requests BOTTLES DRAWN AEROBIC AND ANAEROBIC 5 CC EACH  Final   Culture  Setup Time   Final    GRAM POSITIVE COCCI IN CLUSTERS ANAEROBIC BOTTLE ONLY CRITICAL RESULT CALLED TO, READ BACK BY AND VERIFIED WITH: A. Pham Pharm.D. 16:20 11/14/16 ([email or web address]) GRAM NEGATIVE RODS AEROBIC BOTTLE ONLY CRITICAL RESULT CALLED TO, READ BACK BY AND VERIFIED WITH: A PHAM PHARMD 1908 11/14/16 A BROWNING    Culture (A)  Final    STAPHYLOCOCCUS SPECIES (COAGULASE NEGATIVE) THE SIGNIFICANCE OF ISOLATING THIS ORGANISM FROM A SINGLE SET OF BLOOD CULTURES WHEN MULTIPLE SETS ARE DRAWN IS UNCERTAIN. PLEASE NOTIFY THE MICROBIOLOGY DEPARTMENT WITHIN ONE WEEK  IF SPECIATION AND SENSITIVITIES ARE REQUIRED. KLEBSIELLA OXYTOCA SUSCEPTIBILITIES PERFORMED ON PREVIOUS CULTURE WITHIN THE LAST 5 DAYS. Performed at [facility], [place] [address], [place], [place] [address]    Report Status 11/16/2016 FINAL  Final  Blood Culture ID Panel (Reflexed)     Status: Abnormal   Collection Time: 11/13/16  5:16 PM  Result Value Ref Range Status   Enterococcus species NOT DETECTED NOT DETECTED Final   Listeria monocytogenes NOT DETECTED NOT DETECTED Final   Staphylococcus species NOT DETECTED NOT DETECTED Corrected    Comment: CORRECTED ON 02/08 AT 1928: PREVIOUSLY REPORTED AS DETECTED Methicillin (oxacillin) resistant coagulase negative staphylococcus. Possible blood culture contaminant (unless isolated from more than one blood culture draw or clinical case suggests  pathogenicity). No antibiotic treatment is indicated for blood culture contaminants. CRITICAL RESULT CALLED TO, READ BACK BY AND VERIFIED WITH: A. Pham Pharm.D. 16:20 11/14/16 ([email or web address])    Staphylococcus aureus NOT DETECTED NOT DETECTED Final   Streptococcus species NOT DETECTED NOT DETECTED Final   Streptococcus agalactiae NOT DETECTED NOT DETECTED Final    Streptococcus pneumoniae NOT DETECTED NOT DETECTED Final   Streptococcus pyogenes NOT DETECTED NOT DETECTED Final   Acinetobacter baumannii NOT DETECTED NOT DETECTED Final   Enterobacteriaceae species DETECTED (A) NOT DETECTED Corrected    Comment: Enterobacteriaceae represent a large family of gram-negative bacteria, not a single organism. CRITICAL RESULT CALLED TO, READ BACK BY AND VERIFIED WITH: A PHAM PHARMD 1908 11/14/16 A BROWNING CORRECTED ON 02/08 AT 1928: PREVIOUSLY REPORTED AS NOT DETECTED    Enterobacter cloacae complex NOT DETECTED NOT DETECTED Final   Escherichia coli NOT DETECTED NOT DETECTED Final   Klebsiella oxytoca DETECTED (A) NOT DETECTED Corrected    Comment: CRITICAL RESULT CALLED TO, READ BACK BY AND VERIFIED WITH: A PHAM PHARMD 1908 11/14/16 A BROWNING CORRECTED ON 02/08 AT 1928: PREVIOUSLY REPORTED AS NOT DETECTED    Klebsiella pneumoniae NOT DETECTED NOT DETECTED Final   Proteus species NOT DETECTED NOT DETECTED Final   Serratia marcescens NOT DETECTED NOT DETECTED Final   Carbapenem resistance NOT DETECTED NOT DETECTED Corrected   Haemophilus influenzae NOT DETECTED NOT DETECTED Final   Neisseria meningitidis NOT DETECTED NOT DETECTED Final   Pseudomonas aeruginosa NOT DETECTED NOT DETECTED Final   Candida albicans NOT DETECTED NOT DETECTED Final   Candida glabrata NOT DETECTED NOT DETECTED Final   Candida krusei NOT DETECTED NOT DETECTED Final   Candida parapsilosis NOT DETECTED NOT DETECTED Final   Candida tropicalis NOT DETECTED NOT DETECTED Final    Comment: Performed at [facility], [place] [address] [place], [place] [address]  Blood Culture ID Panel (Reflexed)     Status: Abnormal   Collection Time: 11/13/16  5:16 PM  Result Value Ref Range Status   Enterococcus species NOT DETECTED NOT DETECTED Final   Listeria monocytogenes NOT DETECTED NOT DETECTED Final   Staphylococcus species DETECTED (A) NOT DETECTED Final    Comment: Methicillin  (oxacillin) resistant coagulase negative staphylococcus. Possible blood culture contaminant (unless isolated from more than one blood culture draw or clinical case suggests pathogenicity). No antibiotic treatment is indicated for blood  culture contaminants. CRITICAL RESULT CALLED TO, READ BACK BY AND VERIFIED WITH: A PHAM,PHARMD AT 1620 11/14/16 BY M WILSON    Staphylococcus aureus NOT DETECTED NOT DETECTED Final   Methicillin resistance DETECTED (A) NOT DETECTED Final    Comment: CRITICAL RESULT CALLED TO, READ BACK BY AND VERIFIED WITH: A PHAM,PHARMD AT 1620 11/14/16 BY M  WILSON THIS IS A CORRECTED REPORT. SEE [id number] FOR PREVIOUSLY REPORTED RESULT.    Streptococcus species NOT DETECTED NOT DETECTED Final   Streptococcus agalactiae NOT DETECTED NOT DETECTED Final   Streptococcus pneumoniae NOT DETECTED NOT DETECTED Final   Streptococcus pyogenes NOT DETECTED NOT DETECTED Final   Acinetobacter baumannii NOT DETECTED NOT DETECTED Final   Enterobacteriaceae species NOT DETECTED NOT DETECTED Final   Enterobacter cloacae complex NOT DETECTED NOT DETECTED Final   Escherichia coli NOT DETECTED NOT DETECTED Final   Klebsiella oxytoca NOT DETECTED NOT DETECTED Final   Klebsiella pneumoniae NOT DETECTED NOT DETECTED Final   Proteus species NOT DETECTED NOT DETECTED Final   Serratia marcescens NOT DETECTED NOT DETECTED Final   Haemophilus influenzae NOT DETECTED NOT DETECTED Final   Neisseria meningitidis NOT DETECTED NOT DETECTED Final   Pseudomonas aeruginosa NOT DETECTED NOT DETECTED Final   Candida albicans NOT DETECTED NOT DETECTED Final   Candida glabrata NOT DETECTED NOT DETECTED Final   Candida krusei NOT DETECTED NOT DETECTED Final   Candida parapsilosis NOT DETECTED NOT DETECTED Final   Candida tropicalis NOT DETECTED NOT DETECTED Final    Comment: Performed at [facility], [place] [address], [place], [place] [address]  Urine culture     Status: Abnormal   Collection Time: 11/13/16   7:39 PM  Result Value Ref Range Status   Specimen Description URINE, RANDOM  Final   Special Requests NONE  Final   Culture >=100,000 COLONIES/mL KLEBSIELLA OXYTOCA (A)  Final   Report Status 11/16/2016 FINAL  Final   Organism ID, Bacteria KLEBSIELLA OXYTOCA (A)  Final      Susceptibility   Klebsiella oxytoca - MIC*    AMPICILLIN RESISTANT Resistant     CEFAZOLIN 8 SENSITIVE Sensitive     CEFTRIAXONE <=1 SENSITIVE Sensitive     CIPROFLOXACIN <=0.25 SENSITIVE Sensitive     GENTAMICIN <=1 SENSITIVE Sensitive     IMIPENEM <=0.25 SENSITIVE Sensitive     NITROFURANTOIN 32 SENSITIVE Sensitive     TRIMETH/SULFA <=20 SENSITIVE Sensitive     AMPICILLIN/SULBACTAM 8 SENSITIVE Sensitive     PIP/TAZO <=4 SENSITIVE Sensitive     Extended ESBL NEGATIVE Sensitive     * >=100,000 COLONIES/mL KLEBSIELLA OXYTOCA  MRSA PCR Screening     Status: None   Collection Time: 11/14/16 10:47 AM  Result Value Ref Range Status   MRSA by PCR NEGATIVE NEGATIVE Final    Comment:        The GeneXpert MRSA Assay (FDA approved for NASAL specimens only), is one component of a comprehensive MRSA colonization surveillance program. It is not intended to diagnose MRSA infection nor to guide or monitor treatment for MRSA infections.      Time coordinating discharge: Over 30 minutes  SIGNED:   Cordelia Poche, MD [facility] 11/17/2016, 2:55 PM Pager [phone]  If 7PM-7AM, please contact night-coverage [email or web address] Password [email or web address]

## 2016-11-17 NOTE — Progress Notes (Signed)
Pt was seen by MD this am, and rec'd orders to be discharged home. Previously pt had refused going to SNF upon discharge, stating he had to go home and take care of his wife. While going over discharge instructions with pt, he stated he did not feel like he could go home and take care of his wife and agreed to go to SNF. Contacted CSW and MD notified.

## 2016-11-17 NOTE — Discharge Instructions (Signed)
[name] House,  Your admitted and found to have a urine and blood infection. Your given antibiotics to help with this infection. Your blood pressure was low in the physical therapist thought that you very weak and recommended skilled nursing facility. Since you have decided to go home, we have ordered for home health physical therapy and while you're here, he had a CT scan that showed a abnormal finding of your liver and your abdomen. He opted to not have this further evaluated during your hospitalization. I highly recommend that you follow-up very closely to have an MRI performed of the abdomen to evaluate this in the near future. Please be to her primary care physician about this. Was pleasure taking care of you.  Sincerely, Cordelia Poche, M.D. [facility]

## 2016-11-17 NOTE — Clinical Social Work Note (Signed)
Clinical Social Work Assessment  Patient Details  Name: [name] House MRN: [id number] Date of Birth: September 13, 1946  Date of referral:  11/17/16               Reason for consult:  Facility Placement                Permission sought to share information with:  Facility Art therapist granted to share information::  Yes, Verbal Permission Granted  Name::        Agency::  SNF  Relationship::     Contact Information:     Housing/Transportation Living arrangements for the past 2 months:  Single Family Home Source of Information:  Patient Patient Interpreter Needed:  None Criminal Activity/Legal Involvement Pertinent to Current Situation/Hospitalization:  No - Comment as needed Significant Relationships:  Spouse Lives with:  Spouse Do you feel safe going back to the place where you live?  No Need for family participation in patient care:  No (Coment)  Care giving concerns:  Pt lives at home with his wife who he cares for ( she has cancer) does not have needed level of physical assistance available at home.   Social Worker assessment / plan:  CSW received call that pt now agreeable to SNF- has been adamant about going home.  Pt now acknowledging home is not a safe DC plan with his current level of impairment.  Employment status:  Retired Nurse, adult PT Recommendations:  [place] / Referral to community resources:  [place]  Patient/Family's Response to care:  Agreeable to SNF stay but hopeful it can be close to home.  Patient/Family's Understanding of and Emotional Response to Diagnosis, Current Treatment, and Prognosis:  Pt seemed somewhat sad at his condition and inability to go home and care for his wife but expressed understanding that he is unable to go home in his current state.  Emotional Assessment Appearance:  Appears stated age Attitude/Demeanor/Rapport:    Affect (typically observed):   Appropriate Orientation:  Oriented to Situation, Oriented to  Time, Oriented to Place, Oriented to Self Alcohol / Substance use:  Not Applicable Psych involvement (Current and /or in the community):  No (Comment)  Discharge Needs  Concerns to be addressed:  Care Coordination Readmission within the last 30 days:  No Current discharge risk:  Physical Impairment Barriers to Discharge:  Continued Medical Work up   Jorge Ny, LCSW 11/17/2016, 3:20 PM

## 2016-11-18 DIAGNOSIS — R531 Weakness: Secondary | ICD-10-CM | POA: Diagnosis not present

## 2016-11-18 DIAGNOSIS — N184 Chronic kidney disease, stage 4 (severe): Secondary | ICD-10-CM | POA: Diagnosis not present

## 2016-11-18 DIAGNOSIS — R278 Other lack of coordination: Secondary | ICD-10-CM | POA: Diagnosis not present

## 2016-11-18 DIAGNOSIS — N390 Urinary tract infection, site not specified: Secondary | ICD-10-CM | POA: Diagnosis not present

## 2016-11-18 DIAGNOSIS — R7881 Bacteremia: Secondary | ICD-10-CM | POA: Diagnosis not present

## 2016-11-18 DIAGNOSIS — R2689 Other abnormalities of gait and mobility: Secondary | ICD-10-CM | POA: Diagnosis not present

## 2016-11-18 DIAGNOSIS — E119 Type 2 diabetes mellitus without complications: Secondary | ICD-10-CM | POA: Diagnosis not present

## 2016-11-18 DIAGNOSIS — K7581 Nonalcoholic steatohepatitis (NASH): Secondary | ICD-10-CM | POA: Diagnosis not present

## 2016-11-18 DIAGNOSIS — I10 Essential (primary) hypertension: Secondary | ICD-10-CM | POA: Diagnosis not present

## 2016-11-18 DIAGNOSIS — B961 Klebsiella pneumoniae [K. pneumoniae] as the cause of diseases classified elsewhere: Secondary | ICD-10-CM | POA: Diagnosis not present

## 2016-11-18 DIAGNOSIS — N3000 Acute cystitis without hematuria: Secondary | ICD-10-CM | POA: Diagnosis not present

## 2016-11-18 LAB — BASIC METABOLIC PANEL
Anion gap: 5 (ref 5–15)
BUN: 31 mg/dL — ABNORMAL HIGH (ref 6–20)
CALCIUM: 7.5 mg/dL — AB (ref 8.9–10.3)
CO2: 18 mmol/L — ABNORMAL LOW (ref 22–32)
CREATININE: 1.38 mg/dL — AB (ref 0.61–1.24)
Chloride: 113 mmol/L — ABNORMAL HIGH (ref 101–111)
GFR calc non Af Amer: 51 mL/min — ABNORMAL LOW (ref >60)
GFR, EST AFRICAN AMERICAN: 59 mL/min — AB (ref >60)
Glucose, Bld: 130 mg/dL — ABNORMAL HIGH (ref 65–99)
Potassium: 4 mmol/L (ref 3.5–5.1)
SODIUM: 136 mmol/L (ref 135–145)

## 2016-11-18 MED ORDER — CEPHALEXIN 500 MG PO CAPS: 500.0000 mg | ORAL_CAPSULE | Freq: Four times a day (QID) | ORAL | Status: AC

## 2016-11-18 NOTE — Clinical Social Work Placement (Signed)
   CLINICAL SOCIAL WORK PLACEMENT  NOTE  Date:  11/18/2016  Patient Details  Name: [name] House MRN: [id number] Date of Birth: 08/04/1946  Clinical Social Work is seeking post-discharge placement for this patient at the [place] level of care (*CSW will initial, date and re-position this form in  chart as items are completed):  Yes   Patient/family provided with [facility] Work Department's list of facilities offering this level of care within the geographic area requested by the patient (or if unable, by the patient's family).  Yes   Patient/family informed of their freedom to choose among providers that offer the needed level of care, that participate in Medicare, Medicaid or managed care program needed by the patient, have an available bed and are willing to accept the patient.  Yes   Patient/family informed of [name]'s ownership interest in [facility] and [facility], as well as of the fact that they are under no obligation to receive care at these facilities.  PASRR submitted to EDS on 11/17/16     PASRR number received on 11/17/16     Existing PASRR number confirmed on       FL2 transmitted to all facilities in geographic area requested by pt/family on 11/17/16     FL2 transmitted to all facilities within larger geographic area on       Patient informed that his/her managed care company has contracts with or will negotiate with certain facilities, including the following:        Yes   Patient/family informed of bed offers received.  Patient [facility] bed [facility]     Physician recommends and patient chooses bed at      Patient to be transferred to [facility] on 11/18/16.  Patient to be transferred to facility by [facility]     Patient family notified on 11/18/16 of transfer.  Name of family member notified:  SPOUSE     PHYSICIAN Please sign FL2     Additional Comment: Pt / spouse are in  agreement with d/c to [facility] today. Health Team Adv insurance provided authorization for sNF. [facility] transport required. Pt / spouse are aware out of pocket costs may be associated with [facility] transport. Medical Necessity form completed. No scripts printed. D/C summary sent to SNF for review. # for report provided to nsg.   _______________________________________________ Luretha Rued, [place]  [phone] 11/18/2016, 1:42 PM

## 2016-11-18 NOTE — Progress Notes (Signed)
CSW assisting with d/c planning. PN reviewed. [facility] contacted and is able to admit pt today pending Health Team Adv approval. Insurance contacted and is reviewing request this am and will call CSW with decision shortly. CSW will continue to follow to assist with d/c planning needs.  Werner Lean LCSW [phone]

## 2016-11-18 NOTE — Progress Notes (Signed)
Full report has been called Dawn the nurse at [facility] including last set of vital signs. Per social work the family including the wife Santiago Glad, has been updated about the room number and status of the patient. The patient is alert and oriented and all vital signs are within normal limits. The patient is not in any acute distress or pain. Full report will also be given at bedside to [facility] and discharge paperwork will be reviewed with the patient. Will continue to follow up as needed.

## 2016-11-18 NOTE — Discharge Summary (Addendum)
Physician Discharge Summary  [name] House [id number] DOB: 09-06-1946 DOA: 11/13/2016  PCP: [name], MD  Admit date: 11/13/2016 Discharge date: 11/18/2016  Admitted From: Home Disposition: SNF  Recommendations for Outpatient Follow-up:  1. Follow up with PCP in 1 week 2. Please obtain CBC to recheck platelets Please obtain BMP to recheck creatinine and renally adjust Keflex as necessary. 3. Nephrology follow-up for CKD 4. Needs follow-up MRI to assess liver and abdominal lesions (patient declined evaluation while admitted) 5. Diuretics held on discharge secondary to hypotension. This will need reevaluation for potential restart.  Home Health: Home health physical therapy Equipment/Devices: 3-in-1, rolling walker  Discharge Condition: Guarded CODE STATUS: Full code Diet recommendation: Heart healthy diet   Brief/Interim Summary:  Admission HPI written by Jennette Kettle, DO   HPI: [name] House is a 71 y.o. male with medical history significant of NASH cirrhosis, gastric bypass, chronic iron def anemia, chronic thrombocytopenia, hepatic encephalopathy.  Patient presents to the ED today with c/o fever and chills and generalized weakness.  Symptoms onset yesterday.  Symptoms persistent.  No abd pain, vomiting, dysuria, or diarrhea worse than normal with lactulose.  ED Course: UA suggestive of UTI.  Patient also has creat of 2.5 (has been running 2.5-3 since July of last year, prior to that was 1.1), NAG metabolic acidosis that appears to be chronically worsening since July of last year.    Hospital course:  Bacteremia secondary to klebsiella infection UTI secondary to klebsiella Patient started on Ceftriaxone empirically. Urine and blood culture resulted positive for klebsiella oxytoca. Sensitivities obtained and patient transitioned to Keflex after telephone discussion with infectious disease. Will treat for a total 10 day course. PT evaluated patient and recommended  SNF. Patient opted for home health with DME but changed his mind and is now okay with SNF.  Hypotension Likely secondary to hypovolemia secondary to dehydration and diuretic therapy. Patient given fluids. Diuretics held. Orthostatic vitals negative. Diuretics and nadolol held on discharge. Follow-up as outpatient.  Acute kidney injury on CKD stage IV Does not follow with nephrology as an outpatient. Likely secondary to dehydration. Creatinine trended down with fluids. Nephrology consulted and will see as an outpatient for CKD  Non-anion gap metabolic acidosis Secondary to chronic kidney disease. Started on bicarbonate and will continue on discharge.   Cirrhosis Stable. Neomycin discontinued on admission. Ammonia slightly elevated. Lactulose continued.  Diabetes mellitus, type 2 Stable. Diet controlled  Thrombocytopenia Chronic. Initially declined but then stabilized. Likely acutely dropped secondary to acute infection.  Essential hypertension Held nadolol in setting of hypotension. Discontinued on discharge.  Liver/abdominal wall lesions CT scan significant for concerning lesions. MRI recommended but patient declined. No clinical evidence of incarceration or strangulation. Will need follow-up as an outpatient.   Discharge Diagnoses:  Principal Problem:   Lower urinary tract infectious disease Active Problems:   Fever   Diabetes mellitus type 2, controlled (HCC)   Thrombocytopenia (HCC)   Metabolic acidosis, normal anion gap (NAG)   Liver cirrhosis secondary to NASH (HCC)   CKD (chronic kidney disease) stage 4, GFR 15-29 ml/min (HCC)   Acute renal failure superimposed on stage 4 chronic kidney disease ([place])   Bacteremia   Klebsiella infection    Discharge Instructions   Allergies as of 11/18/2016      Reactions   Oxycodone Other (See Comments)   Caused him to fall..really sedated      Medication List    STOP taking these medications   diphenoxylate-atropine  2.5-0.025 MG  tablet Commonly known as:  LOMOTIL   furosemide 40 MG tablet Commonly known as:  LASIX   nadolol 20 MG tablet Commonly known as:  CORGARD   neomycin 500 MG tablet Commonly known as:  MYCIFRADIN   spironolactone 100 MG tablet Commonly known as:  ALDACTONE     TAKE these medications   cephALEXin 500 MG capsule Commonly known as:  KEFLEX Take 1 capsule (500 mg total) by mouth 4 (four) times daily.   lactulose (encephalopathy) 10 GM/15ML Soln Commonly known as:  CHRONULAC Take 30 mLs (20 g total) by mouth 2 (two) times daily.   sodium bicarbonate 650 MG tablet Take 1 tablet (650 mg total) by mouth 2 (two) times daily.            Durable Medical Equipment        Start     Ordered   11/16/16 1600  For home use only DME 3 n 1  Once     11/16/16 1601   11/16/16 1600  For home use only DME Walker rolling  Once    Question Answer Comment  Patient needs a walker to treat with the following condition UTI (urinary tract infection)   Patient needs a walker to treat with the following condition Hypotension      11/16/16 1601     Follow-up Information    PATEL,JAY K., MD Follow up on 12/09/2016.   Specialty:  Nephrology Why:  Follow up appt with kidney doctor on Monday March 5th at 10:45 am.   Contact information: [place] [address] [phone]        [facility] Follow up.   Why:  Home Health Physical Therapy Contact information: [place] [address] [phone]        [name], MD. Schedule an appointment as soon as possible for a visit in 2 day(s).   Specialty:  Internal Medicine Why:  [place] lab work Contact information: [place] [address] [phone]          Allergies  Allergen Reactions  . Oxycodone Other (See Comments)    Caused him to fall..really sedated    Consultations:  Nephrology  Infectious disease  (Telephone)   Procedures/Studies: Dg Chest 2 View  Result Date: 11/13/2016 CLINICAL DATA:  Fever and shortness of breath EXAM: CHEST  2 VIEW COMPARISON:  03/10/2008 FINDINGS: The cardio pericardial silhouette is enlarged. There is pulmonary vascular congestion without overt pulmonary edema. Prominent skin folds overlie the lungs bilaterally. The visualized bony structures of the thorax are intact. IMPRESSION: Low volume film with cardiomegaly and vascular congestion. Electronically Signed   By: Misty Stanley M.D.   On: 11/13/2016 17:46      Subjective: Patient reports no nausea, vomiting or chest pain. No abdominal pain.  Discharge Exam: Vitals:   11/18/16 0900 11/18/16 1000  BP:  (!) 132/59  Pulse: 88 85  Resp: (!) 21 18  Temp:     Vitals:   11/18/16 0700 11/18/16 0800 11/18/16 0900 11/18/16 1000  BP:  (!) 123/58  (!) 132/59  Pulse: 79 80 88 85  Resp: 18 (!) 21 (!) 21 18  Temp:  98.6 F (37 C)    TempSrc:  Oral    SpO2: 100% 100%  96%  Weight:      Height:        General exam: Appears calm and chronically ill Respiratory system: Clear to auscultation. Respiratory effort normal. Cardiovascular system:  S1 & S2 heard, RRR. No murmurs, rubs, gallops or clicks. Gastrointestinal system: Abdomen is distended, soft and nontender. Normal bowel sounds heard. Central nervous system: Alert and oriented. No focal neurological deficits. Extremities: No edema. No calf tenderness Skin: No cyanosis. No rashes Psychiatry: Judgement and insight appear normal. Mood & affect depressed and flat.   The results of significant diagnostics from this hospitalization (including imaging, microbiology, ancillary and laboratory) are listed below for reference.     Microbiology: Recent Results (from the past 240 hour(s))  Blood culture (routine x 2)     Status: Abnormal   Collection Time: 11/13/16  5:04 PM  Result Value Ref Range Status   Specimen Description BLOOD LEFT ANTECUBITAL  Final    Special Requests BOTTLES DRAWN AEROBIC AND ANAEROBIC 5 CC EA  Final   Culture  Setup Time   Final    GRAM NEGATIVE RODS IN BOTH AEROBIC AND ANAEROBIC BOTTLES CRITICAL RESULT CALLED TO, READ BACK BY AND VERIFIED WITH: EBurman Foster.D. 11:10 11/14/16 ([email or web address]) Performed at [facility], [place] [address], [place], [place] [address]    Culture KLEBSIELLA OXYTOCA (A)  Final   Report Status 11/16/2016 FINAL  Final   Organism ID, Bacteria KLEBSIELLA OXYTOCA  Final      Susceptibility   Klebsiella oxytoca - MIC*    AMPICILLIN RESISTANT Resistant     CEFAZOLIN 8 SENSITIVE Sensitive     CEFEPIME <=1 SENSITIVE Sensitive     CEFTAZIDIME <=1 SENSITIVE Sensitive     CEFTRIAXONE <=1 SENSITIVE Sensitive     CIPROFLOXACIN <=0.25 SENSITIVE Sensitive     GENTAMICIN <=1 SENSITIVE Sensitive     IMIPENEM <=0.25 SENSITIVE Sensitive     TRIMETH/SULFA <=20 SENSITIVE Sensitive     AMPICILLIN/SULBACTAM 4 SENSITIVE Sensitive     PIP/TAZO <=4 SENSITIVE Sensitive     Extended ESBL NEGATIVE Sensitive     * KLEBSIELLA OXYTOCA  Blood Culture ID Panel (Reflexed)     Status: Abnormal   Collection Time: 11/13/16  5:04 PM  Result Value Ref Range Status   Enterococcus species NOT DETECTED NOT DETECTED Final   Listeria monocytogenes NOT DETECTED NOT DETECTED Final   Staphylococcus species NOT DETECTED NOT DETECTED Final   Staphylococcus aureus NOT DETECTED NOT DETECTED Final   Streptococcus species NOT DETECTED NOT DETECTED Final   Streptococcus agalactiae NOT DETECTED NOT DETECTED Final   Streptococcus pneumoniae NOT DETECTED NOT DETECTED Final   Streptococcus pyogenes NOT DETECTED NOT DETECTED Final   Acinetobacter baumannii NOT DETECTED NOT DETECTED Final   Enterobacteriaceae species DETECTED (A) NOT DETECTED Final    Comment: Enterobacteriaceae represent a large family of gram-negative bacteria, not a single organism. CRITICAL RESULT CALLED TO, READ BACK BY AND VERIFIED WITH: EBurman Foster.D. 11:10  11/14/16 (wilsonm)    Enterobacter cloacae complex NOT DETECTED NOT DETECTED Final   Escherichia coli NOT DETECTED NOT DETECTED Final   Klebsiella oxytoca DETECTED (A) NOT DETECTED Final    Comment: CRITICAL RESULT CALLED TO, READ BACK BY AND VERIFIED WITH: EBurman Foster.D. 11:10 11/14/16 (wilsonm)    Klebsiella pneumoniae NOT DETECTED NOT DETECTED Final   Proteus species NOT DETECTED NOT DETECTED Final   Serratia marcescens NOT DETECTED NOT DETECTED Final   Carbapenem resistance NOT DETECTED NOT DETECTED Final   Haemophilus influenzae NOT DETECTED NOT DETECTED Final   Neisseria meningitidis NOT DETECTED NOT DETECTED Final   Pseudomonas aeruginosa NOT DETECTED NOT DETECTED Final   Candida albicans NOT DETECTED NOT DETECTED Final  Candida glabrata NOT DETECTED NOT DETECTED Final   Candida krusei NOT DETECTED NOT DETECTED Final   Candida parapsilosis NOT DETECTED NOT DETECTED Final   Candida tropicalis NOT DETECTED NOT DETECTED Final    Comment: Performed at [facility], [place] [address], [place], [place] [address]  Blood culture (routine x 2)     Status: Abnormal   Collection Time: 11/13/16  5:16 PM  Result Value Ref Range Status   Specimen Description BLOOD RIGHT ANTECUBITAL  Final   Special Requests BOTTLES DRAWN AEROBIC AND ANAEROBIC 5 CC EACH  Final   Culture  Setup Time   Final    GRAM POSITIVE COCCI IN CLUSTERS ANAEROBIC BOTTLE ONLY CRITICAL RESULT CALLED TO, READ BACK BY AND VERIFIED WITH: A. Pham Pharm.D. 16:20 11/14/16 ([email or web address]) GRAM NEGATIVE RODS AEROBIC BOTTLE ONLY CRITICAL RESULT CALLED TO, READ BACK BY AND VERIFIED WITH: A PHAM PHARMD 1908 11/14/16 A BROWNING    Culture (A)  Final    STAPHYLOCOCCUS SPECIES (COAGULASE NEGATIVE) THE SIGNIFICANCE OF ISOLATING THIS ORGANISM FROM A SINGLE SET OF BLOOD CULTURES WHEN MULTIPLE SETS ARE DRAWN IS UNCERTAIN. PLEASE NOTIFY THE MICROBIOLOGY DEPARTMENT WITHIN ONE WEEK IF SPECIATION AND SENSITIVITIES ARE REQUIRED. KLEBSIELLA  OXYTOCA SUSCEPTIBILITIES PERFORMED ON PREVIOUS CULTURE WITHIN THE LAST 5 DAYS. Performed at [facility], [place] [address], [place], [place] [address]    Report Status 11/16/2016 FINAL  Final  Blood Culture ID Panel (Reflexed)     Status: Abnormal   Collection Time: 11/13/16  5:16 PM  Result Value Ref Range Status   Enterococcus species NOT DETECTED NOT DETECTED Final   Listeria monocytogenes NOT DETECTED NOT DETECTED Final   Staphylococcus species NOT DETECTED NOT DETECTED Corrected    Comment: CORRECTED ON 02/08 AT 1928: PREVIOUSLY REPORTED AS DETECTED Methicillin (oxacillin) resistant coagulase negative staphylococcus. Possible blood culture contaminant (unless isolated from more than one blood culture draw or clinical case suggests  pathogenicity). No antibiotic treatment is indicated for blood culture contaminants. CRITICAL RESULT CALLED TO, READ BACK BY AND VERIFIED WITH: A. Pham Pharm.D. 16:20 11/14/16 ([email or web address])    Staphylococcus aureus NOT DETECTED NOT DETECTED Final   Streptococcus species NOT DETECTED NOT DETECTED Final   Streptococcus agalactiae NOT DETECTED NOT DETECTED Final   Streptococcus pneumoniae NOT DETECTED NOT DETECTED Final   Streptococcus pyogenes NOT DETECTED NOT DETECTED Final   Acinetobacter baumannii NOT DETECTED NOT DETECTED Final   Enterobacteriaceae species DETECTED (A) NOT DETECTED Corrected    Comment: Enterobacteriaceae represent a large family of gram-negative bacteria, not a single organism. CRITICAL RESULT CALLED TO, READ BACK BY AND VERIFIED WITH: A PHAM PHARMD 1908 11/14/16 A BROWNING CORRECTED ON 02/08 AT 1928: PREVIOUSLY REPORTED AS NOT DETECTED    Enterobacter cloacae complex NOT DETECTED NOT DETECTED Final   Escherichia coli NOT DETECTED NOT DETECTED Final   Klebsiella oxytoca DETECTED (A) NOT DETECTED Corrected    Comment: CRITICAL RESULT CALLED TO, READ BACK BY AND VERIFIED WITH: A PHAM PHARMD 1908 11/14/16 A BROWNING CORRECTED ON 02/08 AT  1928: PREVIOUSLY REPORTED AS NOT DETECTED    Klebsiella pneumoniae NOT DETECTED NOT DETECTED Final   Proteus species NOT DETECTED NOT DETECTED Final   Serratia marcescens NOT DETECTED NOT DETECTED Final   Carbapenem resistance NOT DETECTED NOT DETECTED Corrected   Haemophilus influenzae NOT DETECTED NOT DETECTED Final   Neisseria meningitidis NOT DETECTED NOT DETECTED Final   Pseudomonas aeruginosa NOT DETECTED NOT DETECTED Final   Candida albicans NOT DETECTED NOT DETECTED Final  Candida glabrata NOT DETECTED NOT DETECTED Final   Candida krusei NOT DETECTED NOT DETECTED Final   Candida parapsilosis NOT DETECTED NOT DETECTED Final   Candida tropicalis NOT DETECTED NOT DETECTED Final    Comment: Performed at [facility], [place] [address], [place], [place] [address]  Blood Culture ID Panel (Reflexed)     Status: Abnormal   Collection Time: 11/13/16  5:16 PM  Result Value Ref Range Status   Enterococcus species NOT DETECTED NOT DETECTED Final   Listeria monocytogenes NOT DETECTED NOT DETECTED Final   Staphylococcus species DETECTED (A) NOT DETECTED Final    Comment: Methicillin (oxacillin) resistant coagulase negative staphylococcus. Possible blood culture contaminant (unless isolated from more than one blood culture draw or clinical case suggests pathogenicity). No antibiotic treatment is indicated for blood  culture contaminants. CRITICAL RESULT CALLED TO, READ BACK BY AND VERIFIED WITH: A PHAM,PHARMD AT 1620 11/14/16 BY M WILSON    Staphylococcus aureus NOT DETECTED NOT DETECTED Final   Methicillin resistance DETECTED (A) NOT DETECTED Final    Comment: CRITICAL RESULT CALLED TO, READ BACK BY AND VERIFIED WITH: A PHAM,PHARMD AT 1620 11/14/16 BY M WILSON THIS IS A CORRECTED REPORT. SEE [id number] FOR PREVIOUSLY REPORTED RESULT.    Streptococcus species NOT DETECTED NOT DETECTED Final   Streptococcus agalactiae NOT DETECTED NOT DETECTED Final   Streptococcus pneumoniae NOT DETECTED NOT  DETECTED Final   Streptococcus pyogenes NOT DETECTED NOT DETECTED Final   Acinetobacter baumannii NOT DETECTED NOT DETECTED Final   Enterobacteriaceae species NOT DETECTED NOT DETECTED Final   Enterobacter cloacae complex NOT DETECTED NOT DETECTED Final   Escherichia coli NOT DETECTED NOT DETECTED Final   Klebsiella oxytoca NOT DETECTED NOT DETECTED Final   Klebsiella pneumoniae NOT DETECTED NOT DETECTED Final   Proteus species NOT DETECTED NOT DETECTED Final   Serratia marcescens NOT DETECTED NOT DETECTED Final   Haemophilus influenzae NOT DETECTED NOT DETECTED Final   Neisseria meningitidis NOT DETECTED NOT DETECTED Final   Pseudomonas aeruginosa NOT DETECTED NOT DETECTED Final   Candida albicans NOT DETECTED NOT DETECTED Final   Candida glabrata NOT DETECTED NOT DETECTED Final   Candida krusei NOT DETECTED NOT DETECTED Final   Candida parapsilosis NOT DETECTED NOT DETECTED Final   Candida tropicalis NOT DETECTED NOT DETECTED Final    Comment: Performed at [facility], [place] [address] [place], [place] [address]  Urine culture     Status: Abnormal   Collection Time: 11/13/16  7:39 PM  Result Value Ref Range Status   Specimen Description URINE, RANDOM  Final   Special Requests NONE  Final   Culture >=100,000 COLONIES/mL KLEBSIELLA OXYTOCA (A)  Final   Report Status 11/16/2016 FINAL  Final   Organism ID, Bacteria KLEBSIELLA OXYTOCA (A)  Final      Susceptibility   Klebsiella oxytoca - MIC*    AMPICILLIN RESISTANT Resistant     CEFAZOLIN 8 SENSITIVE Sensitive     CEFTRIAXONE <=1 SENSITIVE Sensitive     CIPROFLOXACIN <=0.25 SENSITIVE Sensitive     GENTAMICIN <=1 SENSITIVE Sensitive     IMIPENEM <=0.25 SENSITIVE Sensitive     NITROFURANTOIN 32 SENSITIVE Sensitive     TRIMETH/SULFA <=20 SENSITIVE Sensitive     AMPICILLIN/SULBACTAM 8 SENSITIVE Sensitive     PIP/TAZO <=4 SENSITIVE Sensitive     Extended ESBL NEGATIVE Sensitive     * >=100,000 COLONIES/mL KLEBSIELLA OXYTOCA   MRSA PCR Screening     Status: None   Collection Time:  11/14/16 10:47 AM  Result Value Ref Range Status   MRSA by PCR NEGATIVE NEGATIVE Final    Comment:        The GeneXpert MRSA Assay (FDA approved for NASAL specimens only), is one component of a comprehensive MRSA colonization surveillance program. It is not intended to diagnose MRSA infection nor to guide or monitor treatment for MRSA infections.      Labs: BNP (last 3 results) No results for input(s): BNP in the last 8760 hours. Basic Metabolic Panel:  Recent Labs Lab 11/14/16 0425 11/15/16 0338 11/16/16 0324 11/17/16 0809 11/18/16 0751  NA 138 135 136 137 136  K 4.9 4.2 3.8 3.9 4.0  CL 113* 111 115* 114* 113*  CO2 13* 17* 17* 17* 18*  GLUCOSE 120* 117* 126* 108* 130*  BUN 43* 54* 48* 38* 31*  CREATININE 2.46* 2.07* 1.75* 1.56* 1.38*  CALCIUM 7.6* 7.3* 7.2* 7.5* 7.5*   Liver Function Tests:  Recent Labs Lab 11/13/16 1704 11/14/16 0425  AST 118* 92*  ALT <5* 55  ALKPHOS 175* 106  BILITOT 3.5* 1.7*  PROT 5.3* 4.6*  ALBUMIN 3.0* 2.6*   No results for input(s): LIPASE, AMYLASE in the last 168 hours.  Recent Labs Lab 11/13/16 2220  AMMONIA 49*   CBC:  Recent Labs Lab 11/13/16 1704 11/13/16 1714 11/14/16 0425 11/15/16 0338 11/16/16 0324  WBC 8.2  --  12.0* 8.7 5.5  NEUTROABS 8.0*  --   --   --   --   HGB 10.8* 9.5* 9.4* 9.0* 8.9*  HCT 30.7* 28.0* 26.4* 24.6* 24.6*  MCV 88.7  --  89.2 87.9 85.7  PLT 36*  --  27* 24* 30*   Cardiac Enzymes: No results for input(s): CKTOTAL, CKMB, CKMBINDEX, TROPONINI in the last 168 hours. BNP: Invalid input(s): POCBNP CBG:  Recent Labs Lab 11/16/16 1342  GLUCAP 119*   D-Dimer No results for input(s): DDIMER in the last 72 hours. Hgb A1c No results for input(s): HGBA1C in the last 72 hours. Lipid Profile No results for input(s): CHOL, HDL, LDLCALC, TRIG, CHOLHDL, LDLDIRECT in the last 72 hours. Thyroid function studies No results for input(s):  TSH, T4TOTAL, T3FREE, THYROIDAB in the last 72 hours.  Invalid input(s): FREET3 Anemia work up No results for input(s): VITAMINB12, FOLATE, FERRITIN, TIBC, IRON, RETICCTPCT in the last 72 hours. Urinalysis    Component Value Date/Time   COLORURINE AMBER (A) 11/13/2016 1939   APPEARANCEUR HAZY (A) 11/13/2016 1939   LABSPEC 1.011 11/13/2016 1939   PHURINE 5.0 11/13/2016 1939   GLUCOSEU NEGATIVE 11/13/2016 1939   HGBUR LARGE (A) 11/13/2016 1939   BILIRUBINUR NEGATIVE 11/13/2016 1939   KETONESUR NEGATIVE 11/13/2016 1939   PROTEINUR NEGATIVE 11/13/2016 1939   UROBILINOGEN 1.0 02/14/2015 0058   NITRITE POSITIVE (A) 11/13/2016 1939   LEUKOCYTESUR SMALL (A) 11/13/2016 1939   Sepsis Labs Invalid input(s): PROCALCITONIN,  WBC,  LACTICIDVEN Microbiology Recent Results (from the past 240 hour(s))  Blood culture (routine x 2)     Status: Abnormal   Collection Time: 11/13/16  5:04 PM  Result Value Ref Range Status   Specimen Description BLOOD LEFT ANTECUBITAL  Final   Special Requests BOTTLES DRAWN AEROBIC AND ANAEROBIC 5 CC EA  Final   Culture  Setup Time   Final    GRAM NEGATIVE RODS IN BOTH AEROBIC AND ANAEROBIC BOTTLES CRITICAL RESULT CALLED TO, READ BACK BY AND VERIFIED WITH: EBurman Foster.D. 11:10 11/14/16 (wilsonm) Performed at [facility], [place]  [address], [place], [place] [address]    Culture KLEBSIELLA OXYTOCA (A)  Final   Report Status 11/16/2016 FINAL  Final   Organism ID, Bacteria KLEBSIELLA OXYTOCA  Final      Susceptibility   Klebsiella oxytoca - MIC*    AMPICILLIN RESISTANT Resistant     CEFAZOLIN 8 SENSITIVE Sensitive     CEFEPIME <=1 SENSITIVE Sensitive     CEFTAZIDIME <=1 SENSITIVE Sensitive     CEFTRIAXONE <=1 SENSITIVE Sensitive     CIPROFLOXACIN <=0.25 SENSITIVE Sensitive     GENTAMICIN <=1 SENSITIVE Sensitive     IMIPENEM <=0.25 SENSITIVE Sensitive     TRIMETH/SULFA <=20 SENSITIVE Sensitive     AMPICILLIN/SULBACTAM 4 SENSITIVE Sensitive     PIP/TAZO  <=4 SENSITIVE Sensitive     Extended ESBL NEGATIVE Sensitive     * KLEBSIELLA OXYTOCA  Blood Culture ID Panel (Reflexed)     Status: Abnormal   Collection Time: 11/13/16  5:04 PM  Result Value Ref Range Status   Enterococcus species NOT DETECTED NOT DETECTED Final   Listeria monocytogenes NOT DETECTED NOT DETECTED Final   Staphylococcus species NOT DETECTED NOT DETECTED Final   Staphylococcus aureus NOT DETECTED NOT DETECTED Final   Streptococcus species NOT DETECTED NOT DETECTED Final   Streptococcus agalactiae NOT DETECTED NOT DETECTED Final   Streptococcus pneumoniae NOT DETECTED NOT DETECTED Final   Streptococcus pyogenes NOT DETECTED NOT DETECTED Final   Acinetobacter baumannii NOT DETECTED NOT DETECTED Final   Enterobacteriaceae species DETECTED (A) NOT DETECTED Final    Comment: Enterobacteriaceae represent a large family of gram-negative bacteria, not a single organism. CRITICAL RESULT CALLED TO, READ BACK BY AND VERIFIED WITH: EBurman Foster.D. 11:10 11/14/16 (wilsonm)    Enterobacter cloacae complex NOT DETECTED NOT DETECTED Final   Escherichia coli NOT DETECTED NOT DETECTED Final   Klebsiella oxytoca DETECTED (A) NOT DETECTED Final    Comment: CRITICAL RESULT CALLED TO, READ BACK BY AND VERIFIED WITH: EBurman Foster.D. 11:10 11/14/16 (wilsonm)    Klebsiella pneumoniae NOT DETECTED NOT DETECTED Final   Proteus species NOT DETECTED NOT DETECTED Final   Serratia marcescens NOT DETECTED NOT DETECTED Final   Carbapenem resistance NOT DETECTED NOT DETECTED Final   Haemophilus influenzae NOT DETECTED NOT DETECTED Final   Neisseria meningitidis NOT DETECTED NOT DETECTED Final   Pseudomonas aeruginosa NOT DETECTED NOT DETECTED Final   Candida albicans NOT DETECTED NOT DETECTED Final   Candida glabrata NOT DETECTED NOT DETECTED Final   Candida krusei NOT DETECTED NOT DETECTED Final   Candida parapsilosis NOT DETECTED NOT DETECTED Final   Candida tropicalis NOT DETECTED NOT  DETECTED Final    Comment: Performed at [facility], [place] [address] [place], [place] [address]  Blood culture (routine x 2)     Status: Abnormal   Collection Time: 11/13/16  5:16 PM  Result Value Ref Range Status   Specimen Description BLOOD RIGHT ANTECUBITAL  Final   Special Requests BOTTLES DRAWN AEROBIC AND ANAEROBIC 5 CC EACH  Final   Culture  Setup Time   Final    GRAM POSITIVE COCCI IN CLUSTERS ANAEROBIC BOTTLE ONLY CRITICAL RESULT CALLED TO, READ BACK BY AND VERIFIED WITH: A. Pham Pharm.D. 16:20 11/14/16 (wilsonm) GRAM NEGATIVE RODS AEROBIC BOTTLE ONLY CRITICAL RESULT CALLED TO, READ BACK BY AND VERIFIED WITH: A PHAM PHARMD 1908 11/14/16 A BROWNING    Culture (A)  Final    STAPHYLOCOCCUS SPECIES (COAGULASE NEGATIVE) THE SIGNIFICANCE OF ISOLATING THIS ORGANISM FROM A SINGLE SET  OF BLOOD CULTURES WHEN MULTIPLE SETS ARE DRAWN IS UNCERTAIN. PLEASE NOTIFY THE MICROBIOLOGY DEPARTMENT WITHIN ONE WEEK IF SPECIATION AND SENSITIVITIES ARE REQUIRED. KLEBSIELLA OXYTOCA SUSCEPTIBILITIES PERFORMED ON PREVIOUS CULTURE WITHIN THE LAST 5 DAYS. Performed at [facility], [place] [address], [place], [place] [address]    Report Status 11/16/2016 FINAL  Final  Blood Culture ID Panel (Reflexed)     Status: Abnormal   Collection Time: 11/13/16  5:16 PM  Result Value Ref Range Status   Enterococcus species NOT DETECTED NOT DETECTED Final   Listeria monocytogenes NOT DETECTED NOT DETECTED Final   Staphylococcus species NOT DETECTED NOT DETECTED Corrected    Comment: CORRECTED ON 02/08 AT 1928: PREVIOUSLY REPORTED AS DETECTED Methicillin (oxacillin) resistant coagulase negative staphylococcus. Possible blood culture contaminant (unless isolated from more than one blood culture draw or clinical case suggests  pathogenicity). No antibiotic treatment is indicated for blood culture contaminants. CRITICAL RESULT CALLED TO, READ BACK BY AND VERIFIED WITH: A. Pham Pharm.D. 16:20 11/14/16 ([email or web address])     Staphylococcus aureus NOT DETECTED NOT DETECTED Final   Streptococcus species NOT DETECTED NOT DETECTED Final   Streptococcus agalactiae NOT DETECTED NOT DETECTED Final   Streptococcus pneumoniae NOT DETECTED NOT DETECTED Final   Streptococcus pyogenes NOT DETECTED NOT DETECTED Final   Acinetobacter baumannii NOT DETECTED NOT DETECTED Final   Enterobacteriaceae species DETECTED (A) NOT DETECTED Corrected    Comment: Enterobacteriaceae represent a large family of gram-negative bacteria, not a single organism. CRITICAL RESULT CALLED TO, READ BACK BY AND VERIFIED WITH: A PHAM PHARMD 1908 11/14/16 A BROWNING CORRECTED ON 02/08 AT 1928: PREVIOUSLY REPORTED AS NOT DETECTED    Enterobacter cloacae complex NOT DETECTED NOT DETECTED Final   Escherichia coli NOT DETECTED NOT DETECTED Final   Klebsiella oxytoca DETECTED (A) NOT DETECTED Corrected    Comment: CRITICAL RESULT CALLED TO, READ BACK BY AND VERIFIED WITH: A PHAM PHARMD 1908 11/14/16 A BROWNING CORRECTED ON 02/08 AT 1928: PREVIOUSLY REPORTED AS NOT DETECTED    Klebsiella pneumoniae NOT DETECTED NOT DETECTED Final   Proteus species NOT DETECTED NOT DETECTED Final   Serratia marcescens NOT DETECTED NOT DETECTED Final   Carbapenem resistance NOT DETECTED NOT DETECTED Corrected   Haemophilus influenzae NOT DETECTED NOT DETECTED Final   Neisseria meningitidis NOT DETECTED NOT DETECTED Final   Pseudomonas aeruginosa NOT DETECTED NOT DETECTED Final   Candida albicans NOT DETECTED NOT DETECTED Final   Candida glabrata NOT DETECTED NOT DETECTED Final   Candida krusei NOT DETECTED NOT DETECTED Final   Candida parapsilosis NOT DETECTED NOT DETECTED Final   Candida tropicalis NOT DETECTED NOT DETECTED Final    Comment: Performed at [facility], [place] [address] [place], [place] [address]  Blood Culture ID Panel (Reflexed)     Status: Abnormal   Collection Time: 11/13/16  5:16 PM  Result Value Ref Range Status   Enterococcus species NOT  DETECTED NOT DETECTED Final   Listeria monocytogenes NOT DETECTED NOT DETECTED Final   Staphylococcus species DETECTED (A) NOT DETECTED Final    Comment: Methicillin (oxacillin) resistant coagulase negative staphylococcus. Possible blood culture contaminant (unless isolated from more than one blood culture draw or clinical case suggests pathogenicity). No antibiotic treatment is indicated for blood  culture contaminants. CRITICAL RESULT CALLED TO, READ BACK BY AND VERIFIED WITH: A PHAM,PHARMD AT 1620 11/14/16 BY M WILSON    Staphylococcus aureus NOT DETECTED NOT DETECTED Final   Methicillin resistance DETECTED (A) NOT DETECTED Final  Comment: CRITICAL RESULT CALLED TO, READ BACK BY AND VERIFIED WITH: A PHAM,PHARMD AT 1620 11/14/16 BY M WILSON THIS IS A CORRECTED REPORT. SEE [id number] FOR PREVIOUSLY REPORTED RESULT.    Streptococcus species NOT DETECTED NOT DETECTED Final   Streptococcus agalactiae NOT DETECTED NOT DETECTED Final   Streptococcus pneumoniae NOT DETECTED NOT DETECTED Final   Streptococcus pyogenes NOT DETECTED NOT DETECTED Final   Acinetobacter baumannii NOT DETECTED NOT DETECTED Final   Enterobacteriaceae species NOT DETECTED NOT DETECTED Final   Enterobacter cloacae complex NOT DETECTED NOT DETECTED Final   Escherichia coli NOT DETECTED NOT DETECTED Final   Klebsiella oxytoca NOT DETECTED NOT DETECTED Final   Klebsiella pneumoniae NOT DETECTED NOT DETECTED Final   Proteus species NOT DETECTED NOT DETECTED Final   Serratia marcescens NOT DETECTED NOT DETECTED Final   Haemophilus influenzae NOT DETECTED NOT DETECTED Final   Neisseria meningitidis NOT DETECTED NOT DETECTED Final   Pseudomonas aeruginosa NOT DETECTED NOT DETECTED Final   Candida albicans NOT DETECTED NOT DETECTED Final   Candida glabrata NOT DETECTED NOT DETECTED Final   Candida krusei NOT DETECTED NOT DETECTED Final   Candida parapsilosis NOT DETECTED NOT DETECTED Final   Candida tropicalis NOT DETECTED NOT  DETECTED Final    Comment: Performed at [facility], [place] [address] [place], [place] [address]  Urine culture     Status: Abnormal   Collection Time: 11/13/16  7:39 PM  Result Value Ref Range Status   Specimen Description URINE, RANDOM  Final   Special Requests NONE  Final   Culture >=100,000 COLONIES/mL KLEBSIELLA OXYTOCA (A)  Final   Report Status 11/16/2016 FINAL  Final   Organism ID, Bacteria KLEBSIELLA OXYTOCA (A)  Final      Susceptibility   Klebsiella oxytoca - MIC*    AMPICILLIN RESISTANT Resistant     CEFAZOLIN 8 SENSITIVE Sensitive     CEFTRIAXONE <=1 SENSITIVE Sensitive     CIPROFLOXACIN <=0.25 SENSITIVE Sensitive     GENTAMICIN <=1 SENSITIVE Sensitive     IMIPENEM <=0.25 SENSITIVE Sensitive     NITROFURANTOIN 32 SENSITIVE Sensitive     TRIMETH/SULFA <=20 SENSITIVE Sensitive     AMPICILLIN/SULBACTAM 8 SENSITIVE Sensitive     PIP/TAZO <=4 SENSITIVE Sensitive     Extended ESBL NEGATIVE Sensitive     * >=100,000 COLONIES/mL KLEBSIELLA OXYTOCA  MRSA PCR Screening     Status: None   Collection Time: 11/14/16 10:47 AM  Result Value Ref Range Status   MRSA by PCR NEGATIVE NEGATIVE Final    Comment:        The GeneXpert MRSA Assay (FDA approved for NASAL specimens only), is one component of a comprehensive MRSA colonization surveillance program. It is not intended to diagnose MRSA infection nor to guide or monitor treatment for MRSA infections.      Time coordinating discharge: Over 30 minutes  SIGNED:   Cordelia Poche, MD [facility] 11/18/2016, 10:46 AM Pager [phone]  If 7PM-7AM, please contact night-coverage [email or web address] Password [email or web address]

## 2016-11-18 NOTE — Progress Notes (Signed)
Front wheel walker and bedside commode were found in patient room and per social work are supposed to be returned. Case management notified and will send someone to pick up items. Paper work still with items and will be given back with equipment. Patient aware that he doe snot need this equipment at this time. Will continue to follow up if needed.

## 2016-11-18 NOTE — Progress Notes (Signed)
NUTRITION NOTE  Consult received this AM for assessment of nutrition requirements and status and for poor PO intakes. Per RN and Financial controller, pt to d/c to nursing home today. Reviewed chart and noted that d/c order and d/c summary are in place.  RD will see pt 2/13 if pt is unable to d/c today.     Jarome Matin, MS, RD, LDN, [facility] Inpatient Clinical Dietitian Pager # [phone] After hours/weekend pager # [phone]

## 2016-11-19 DIAGNOSIS — K7581 Nonalcoholic steatohepatitis (NASH): Secondary | ICD-10-CM | POA: Diagnosis not present

## 2016-11-19 DIAGNOSIS — N390 Urinary tract infection, site not specified: Secondary | ICD-10-CM | POA: Diagnosis not present

## 2016-11-19 DIAGNOSIS — I10 Essential (primary) hypertension: Secondary | ICD-10-CM | POA: Diagnosis not present

## 2016-11-19 DIAGNOSIS — N189 Chronic kidney disease, unspecified: Secondary | ICD-10-CM | POA: Diagnosis not present

## 2016-11-19 DIAGNOSIS — N179 Acute kidney failure, unspecified: Secondary | ICD-10-CM | POA: Diagnosis not present

## 2016-11-20 DIAGNOSIS — K7581 Nonalcoholic steatohepatitis (NASH): Secondary | ICD-10-CM | POA: Diagnosis not present

## 2016-11-20 DIAGNOSIS — N3090 Cystitis, unspecified without hematuria: Secondary | ICD-10-CM | POA: Diagnosis not present

## 2016-11-20 DIAGNOSIS — B961 Klebsiella pneumoniae [K. pneumoniae] as the cause of diseases classified elsewhere: Secondary | ICD-10-CM | POA: Diagnosis not present

## 2016-11-20 DIAGNOSIS — R7881 Bacteremia: Secondary | ICD-10-CM | POA: Diagnosis not present

## 2016-11-20 DIAGNOSIS — G9340 Encephalopathy, unspecified: Secondary | ICD-10-CM | POA: Diagnosis not present

## 2016-11-20 DIAGNOSIS — N183 Chronic kidney disease, stage 3 (moderate): Secondary | ICD-10-CM | POA: Diagnosis not present

## 2016-11-20 DIAGNOSIS — D649 Anemia, unspecified: Secondary | ICD-10-CM | POA: Diagnosis not present

## 2016-11-26 ENCOUNTER — Other Ambulatory Visit: Payer: Self-pay | Admitting: *Deleted

## 2016-11-26 DIAGNOSIS — K7581 Nonalcoholic steatohepatitis (NASH): Secondary | ICD-10-CM | POA: Diagnosis not present

## 2016-11-26 DIAGNOSIS — E119 Type 2 diabetes mellitus without complications: Secondary | ICD-10-CM | POA: Diagnosis not present

## 2016-11-26 DIAGNOSIS — I10 Essential (primary) hypertension: Secondary | ICD-10-CM | POA: Diagnosis not present

## 2016-11-26 DIAGNOSIS — N390 Urinary tract infection, site not specified: Secondary | ICD-10-CM | POA: Diagnosis not present

## 2016-11-26 NOTE — Patient Outreach (Signed)
[facility]) Care Management  11/26/2016  [name] House 09/16/1946 [id number]   Met with patient at bedside of facility.  Patient has hx of NASH, DM and new kidney issue.  Patient reports that he does not think he will at facility but another week or so, he does not know if he will need home care.  Patient reports his wife has a lot of health issues at this time and is undergoing chemo.  He states that he wants to get stronger to be able to go home to help her more.  Patient reports he was told he had to go to kidney appointment but said he already sees his primary care MD and a gastroenterologist for his liver. He states he is not sure why he has to see someone else.   RNCM educated patient on importance of keeping appointment as kidney function is very important and he needs to follow up on findings from recent inpatient stay.   Appt: Dr. Elmarie Shiley 12/09/16 10:45 am [address] [place], [place] gave patient date and time of appointment as he said he did not get any paperwork. He states he can get a ride from a friend if he is still at facility, [facility] reminded that facility could provide transportation if needed.    RNCM reviewed [facility] program services, gave patient a brochure.  Patient feels that his wife may have had [facility] program in the past.   Plan to sign off but will follow up as indicated, patient agrees to contact RNCM or [facility] care management for future needs. Patient given brochure and RNCM contact  Unable to meet with facility SW as they were out of facility.  Royetta Crochet. Laymond Purser, RN, BSN, [place] [phone]) Business Cell  [phone]) Toll Free Office

## 2016-12-04 DIAGNOSIS — N189 Chronic kidney disease, unspecified: Secondary | ICD-10-CM | POA: Diagnosis not present

## 2016-12-04 DIAGNOSIS — K7581 Nonalcoholic steatohepatitis (NASH): Secondary | ICD-10-CM | POA: Diagnosis not present

## 2016-12-04 DIAGNOSIS — E119 Type 2 diabetes mellitus without complications: Secondary | ICD-10-CM | POA: Diagnosis not present

## 2016-12-04 DIAGNOSIS — N390 Urinary tract infection, site not specified: Secondary | ICD-10-CM | POA: Diagnosis not present

## 2016-12-04 DIAGNOSIS — N179 Acute kidney failure, unspecified: Secondary | ICD-10-CM | POA: Diagnosis not present

## 2016-12-09 DIAGNOSIS — D631 Anemia in chronic kidney disease: Secondary | ICD-10-CM | POA: Diagnosis not present

## 2016-12-09 DIAGNOSIS — K7581 Nonalcoholic steatohepatitis (NASH): Secondary | ICD-10-CM | POA: Diagnosis not present

## 2016-12-09 DIAGNOSIS — N2581 Secondary hyperparathyroidism of renal origin: Secondary | ICD-10-CM | POA: Diagnosis not present

## 2016-12-09 DIAGNOSIS — E669 Obesity, unspecified: Secondary | ICD-10-CM | POA: Diagnosis not present

## 2016-12-09 DIAGNOSIS — N183 Chronic kidney disease, stage 3 (moderate): Secondary | ICD-10-CM | POA: Diagnosis not present

## 2016-12-12 DIAGNOSIS — R5383 Other fatigue: Secondary | ICD-10-CM | POA: Diagnosis not present

## 2016-12-12 DIAGNOSIS — N179 Acute kidney failure, unspecified: Secondary | ICD-10-CM | POA: Diagnosis not present

## 2016-12-12 DIAGNOSIS — G4719 Other hypersomnia: Secondary | ICD-10-CM | POA: Diagnosis not present

## 2016-12-12 DIAGNOSIS — E1151 Type 2 diabetes mellitus with diabetic peripheral angiopathy without gangrene: Secondary | ICD-10-CM | POA: Diagnosis not present

## 2016-12-12 DIAGNOSIS — I2510 Atherosclerotic heart disease of native coronary artery without angina pectoris: Secondary | ICD-10-CM | POA: Diagnosis not present

## 2016-12-12 DIAGNOSIS — M109 Gout, unspecified: Secondary | ICD-10-CM | POA: Diagnosis not present

## 2016-12-12 DIAGNOSIS — D696 Thrombocytopenia, unspecified: Secondary | ICD-10-CM | POA: Diagnosis not present

## 2016-12-12 DIAGNOSIS — R188 Other ascites: Secondary | ICD-10-CM | POA: Diagnosis not present

## 2016-12-12 DIAGNOSIS — K7460 Unspecified cirrhosis of liver: Secondary | ICD-10-CM | POA: Diagnosis not present

## 2016-12-12 DIAGNOSIS — G4733 Obstructive sleep apnea (adult) (pediatric): Secondary | ICD-10-CM | POA: Diagnosis not present

## 2016-12-12 DIAGNOSIS — Z6830 Body mass index (BMI) 30.0-30.9, adult: Secondary | ICD-10-CM | POA: Diagnosis not present

## 2016-12-17 ENCOUNTER — Other Ambulatory Visit: Payer: Self-pay | Admitting: Nephrology

## 2016-12-17 DIAGNOSIS — N183 Chronic kidney disease, stage 3 unspecified: Secondary | ICD-10-CM

## 2016-12-20 ENCOUNTER — Observation Stay: Payer: PPO

## 2016-12-20 ENCOUNTER — Observation Stay (HOSPITAL_COMMUNITY): Payer: PPO

## 2016-12-20 ENCOUNTER — Inpatient Hospital Stay (HOSPITAL_COMMUNITY)
Admission: EM | Admit: 2016-12-20 | Discharge: 2016-12-24 | DRG: 441 | Disposition: A | Discharge status: Home Health | Payer: PPO | Attending: Internal Medicine | Admitting: Internal Medicine

## 2016-12-20 ENCOUNTER — Encounter (HOSPITAL_COMMUNITY): Payer: Self-pay | Admitting: Emergency Medicine

## 2016-12-20 DIAGNOSIS — G4730 Sleep apnea, unspecified: Secondary | ICD-10-CM | POA: Diagnosis not present

## 2016-12-20 DIAGNOSIS — K769 Liver disease, unspecified: Secondary | ICD-10-CM | POA: Diagnosis not present

## 2016-12-20 DIAGNOSIS — N179 Acute kidney failure, unspecified: Secondary | ICD-10-CM | POA: Diagnosis present

## 2016-12-20 DIAGNOSIS — N289 Disorder of kidney and ureter, unspecified: Secondary | ICD-10-CM | POA: Diagnosis not present

## 2016-12-20 DIAGNOSIS — I8500 Esophageal varices without bleeding: Secondary | ICD-10-CM | POA: Diagnosis not present

## 2016-12-20 DIAGNOSIS — N5089 Other specified disorders of the male genital organs: Secondary | ICD-10-CM | POA: Diagnosis present

## 2016-12-20 DIAGNOSIS — K7290 Hepatic failure, unspecified without coma: Secondary | ICD-10-CM

## 2016-12-20 DIAGNOSIS — Z885 Allergy status to narcotic agent status: Secondary | ICD-10-CM

## 2016-12-20 DIAGNOSIS — M47816 Spondylosis without myelopathy or radiculopathy, lumbar region: Secondary | ICD-10-CM | POA: Diagnosis not present

## 2016-12-20 DIAGNOSIS — R161 Splenomegaly, not elsewhere classified: Secondary | ICD-10-CM | POA: Diagnosis present

## 2016-12-20 DIAGNOSIS — K7200 Acute and subacute hepatic failure without coma: Secondary | ICD-10-CM | POA: Diagnosis present

## 2016-12-20 DIAGNOSIS — D6959 Other secondary thrombocytopenia: Secondary | ICD-10-CM | POA: Diagnosis present

## 2016-12-20 DIAGNOSIS — E785 Hyperlipidemia, unspecified: Secondary | ICD-10-CM | POA: Diagnosis not present

## 2016-12-20 DIAGNOSIS — D500 Iron deficiency anemia secondary to blood loss (chronic): Secondary | ICD-10-CM | POA: Diagnosis present

## 2016-12-20 DIAGNOSIS — Z86010 Personal history of colonic polyps: Secondary | ICD-10-CM

## 2016-12-20 DIAGNOSIS — Z87442 Personal history of urinary calculi: Secondary | ICD-10-CM | POA: Diagnosis not present

## 2016-12-20 DIAGNOSIS — R188 Other ascites: Secondary | ICD-10-CM | POA: Diagnosis not present

## 2016-12-20 DIAGNOSIS — K766 Portal hypertension: Secondary | ICD-10-CM | POA: Diagnosis present

## 2016-12-20 DIAGNOSIS — R531 Weakness: Secondary | ICD-10-CM

## 2016-12-20 DIAGNOSIS — N4889 Other specified disorders of penis: Secondary | ICD-10-CM | POA: Diagnosis not present

## 2016-12-20 DIAGNOSIS — Z833 Family history of diabetes mellitus: Secondary | ICD-10-CM

## 2016-12-20 DIAGNOSIS — D509 Iron deficiency anemia, unspecified: Secondary | ICD-10-CM | POA: Diagnosis present

## 2016-12-20 DIAGNOSIS — I129 Hypertensive chronic kidney disease with stage 1 through stage 4 chronic kidney disease, or unspecified chronic kidney disease: Secondary | ICD-10-CM | POA: Diagnosis present

## 2016-12-20 DIAGNOSIS — Z9884 Bariatric surgery status: Secondary | ICD-10-CM | POA: Diagnosis not present

## 2016-12-20 DIAGNOSIS — K5730 Diverticulosis of large intestine without perforation or abscess without bleeding: Secondary | ICD-10-CM | POA: Diagnosis present

## 2016-12-20 DIAGNOSIS — N184 Chronic kidney disease, stage 4 (severe): Secondary | ICD-10-CM | POA: Diagnosis present

## 2016-12-20 DIAGNOSIS — Z96641 Presence of right artificial hip joint: Secondary | ICD-10-CM | POA: Diagnosis not present

## 2016-12-20 DIAGNOSIS — K7581 Nonalcoholic steatohepatitis (NASH): Principal | ICD-10-CM | POA: Diagnosis present

## 2016-12-20 DIAGNOSIS — R601 Generalized edema: Secondary | ICD-10-CM | POA: Diagnosis not present

## 2016-12-20 DIAGNOSIS — W19XXXA Unspecified fall, initial encounter: Secondary | ICD-10-CM

## 2016-12-20 DIAGNOSIS — Z8249 Family history of ischemic heart disease and other diseases of the circulatory system: Secondary | ICD-10-CM | POA: Diagnosis not present

## 2016-12-20 DIAGNOSIS — R404 Transient alteration of awareness: Secondary | ICD-10-CM | POA: Diagnosis not present

## 2016-12-20 DIAGNOSIS — K429 Umbilical hernia without obstruction or gangrene: Secondary | ICD-10-CM | POA: Diagnosis present

## 2016-12-20 DIAGNOSIS — K7460 Unspecified cirrhosis of liver: Secondary | ICD-10-CM | POA: Diagnosis not present

## 2016-12-20 DIAGNOSIS — E1122 Type 2 diabetes mellitus with diabetic chronic kidney disease: Secondary | ICD-10-CM | POA: Diagnosis present

## 2016-12-20 DIAGNOSIS — J90 Pleural effusion, not elsewhere classified: Secondary | ICD-10-CM | POA: Diagnosis not present

## 2016-12-20 DIAGNOSIS — S299XXA Unspecified injury of thorax, initial encounter: Secondary | ICD-10-CM | POA: Diagnosis not present

## 2016-12-20 DIAGNOSIS — K7682 Hepatic encephalopathy: Secondary | ICD-10-CM

## 2016-12-20 HISTORY — DX: Lymphocytopenia: D72.810

## 2016-12-20 LAB — CBC WITH DIFFERENTIAL/PLATELET
BASOS ABS: 0 10*3/uL (ref 0.0–0.1)
BASOS PCT: 0 %
EOS ABS: 0 10*3/uL (ref 0.0–0.7)
EOS PCT: 1 %
HEMATOCRIT: 30.3 % — AB (ref 39.0–52.0)
Hemoglobin: 10.7 g/dL — ABNORMAL LOW (ref 13.0–17.0)
Lymphocytes Relative: 5 %
Lymphs Abs: 0.2 10*3/uL — ABNORMAL LOW (ref 0.7–4.0)
MCH: 31.2 pg (ref 26.0–34.0)
MCHC: 35.3 g/dL (ref 30.0–36.0)
MCV: 88.3 fL (ref 78.0–100.0)
MONO ABS: 0.2 10*3/uL (ref 0.1–1.0)
MONOS PCT: 5 %
Neutro Abs: 3.4 10*3/uL (ref 1.7–7.7)
Neutrophils Relative %: 89 %
PLATELETS: 38 10*3/uL — AB (ref 150–400)
RBC: 3.43 MIL/uL — ABNORMAL LOW (ref 4.22–5.81)
RDW: 14.5 % (ref 11.5–15.5)
WBC: 3.8 10*3/uL — ABNORMAL LOW (ref 4.0–10.5)

## 2016-12-20 LAB — BODY FLUID CELL COUNT WITH DIFFERENTIAL
EOS FL: 0 %
Lymphs, Fluid: 8 %
Monocyte-Macrophage-Serous Fluid: 92 % — ABNORMAL HIGH (ref 50–90)
NEUTROPHIL FLUID: 0 % (ref 0–25)
WBC FLUID: 22 uL (ref 0–1000)

## 2016-12-20 LAB — URINALYSIS, ROUTINE W REFLEX MICROSCOPIC
Bilirubin Urine: NEGATIVE
Glucose, UA: NEGATIVE mg/dL
Hgb urine dipstick: NEGATIVE
KETONES UR: 5 mg/dL — AB
LEUKOCYTES UA: NEGATIVE
NITRITE: NEGATIVE
PH: 7 (ref 5.0–8.0)
Protein, ur: NEGATIVE mg/dL
Specific Gravity, Urine: 1.01 (ref 1.005–1.030)

## 2016-12-20 LAB — GRAM STAIN

## 2016-12-20 LAB — CBC
HEMATOCRIT: 28.1 % — AB (ref 39.0–52.0)
Hemoglobin: 9.7 g/dL — ABNORMAL LOW (ref 13.0–17.0)
MCH: 29.9 pg (ref 26.0–34.0)
MCHC: 34.5 g/dL (ref 30.0–36.0)
MCV: 86.7 fL (ref 78.0–100.0)
Platelets: 40 10*3/uL — ABNORMAL LOW (ref 150–400)
RBC: 3.24 MIL/uL — ABNORMAL LOW (ref 4.22–5.81)
RDW: 14.4 % (ref 11.5–15.5)
WBC: 3 10*3/uL — ABNORMAL LOW (ref 4.0–10.5)

## 2016-12-20 LAB — BASIC METABOLIC PANEL
Anion gap: 7 (ref 5–15)
BUN: 25 mg/dL — ABNORMAL HIGH (ref 6–20)
CHLORIDE: 110 mmol/L (ref 101–111)
CO2: 24 mmol/L (ref 22–32)
Calcium: 8.5 mg/dL — ABNORMAL LOW (ref 8.9–10.3)
Creatinine, Ser: 1.96 mg/dL — ABNORMAL HIGH (ref 0.61–1.24)
GFR calc Af Amer: 38 mL/min — ABNORMAL LOW (ref >60)
GFR calc non Af Amer: 33 mL/min — ABNORMAL LOW (ref >60)
Glucose, Bld: 166 mg/dL — ABNORMAL HIGH (ref 65–99)
POTASSIUM: 4 mmol/L (ref 3.5–5.1)
SODIUM: 141 mmol/L (ref 135–145)

## 2016-12-20 LAB — GLUCOSE, PLEURAL OR PERITONEAL FLUID: GLUCOSE FL: 159 mg/dL

## 2016-12-20 LAB — ALBUMIN, PLEURAL OR PERITONEAL FLUID: Albumin, Fluid: <1 g/dL

## 2016-12-20 LAB — COMPREHENSIVE METABOLIC PANEL
ALBUMIN: 3.6 g/dL (ref 3.5–5.0)
ALT: 27 U/L (ref 17–63)
ANION GAP: 10 (ref 5–15)
AST: 47 U/L — ABNORMAL HIGH (ref 15–41)
Alkaline Phosphatase: 163 U/L — ABNORMAL HIGH (ref 38–126)
BILIRUBIN TOTAL: 2.4 mg/dL — AB (ref 0.3–1.2)
BUN: 25 mg/dL — ABNORMAL HIGH (ref 6–20)
CHLORIDE: 108 mmol/L (ref 101–111)
CO2: 22 mmol/L (ref 22–32)
Calcium: 8.4 mg/dL — ABNORMAL LOW (ref 8.9–10.3)
Creatinine, Ser: 1.89 mg/dL — ABNORMAL HIGH (ref 0.61–1.24)
GFR calc Af Amer: 40 mL/min — ABNORMAL LOW (ref >60)
GFR calc non Af Amer: 35 mL/min — ABNORMAL LOW (ref >60)
GLUCOSE: 184 mg/dL — AB (ref 65–99)
POTASSIUM: 3.9 mmol/L (ref 3.5–5.1)
SODIUM: 140 mmol/L (ref 135–145)
TOTAL PROTEIN: 5.8 g/dL — AB (ref 6.5–8.1)

## 2016-12-20 LAB — TYPE AND SCREEN
ABO/RH(D): O POS
ANTIBODY SCREEN: NEGATIVE

## 2016-12-20 LAB — POC OCCULT BLOOD, ED: Fecal Occult Bld: POSITIVE — AB

## 2016-12-20 LAB — APTT: APTT: 27 s (ref 24–36)

## 2016-12-20 LAB — AMMONIA: Ammonia: 64 umol/L — ABNORMAL HIGH (ref 9–35)

## 2016-12-20 LAB — LIPASE, BLOOD: Lipase: 56 U/L — ABNORMAL HIGH (ref 11–51)

## 2016-12-20 LAB — SODIUM, URINE, RANDOM: Sodium, Ur: 107 mmol/L

## 2016-12-20 LAB — PROTEIN, PLEURAL OR PERITONEAL FLUID

## 2016-12-20 LAB — PROTIME-INR
INR: 1.41
PROTHROMBIN TIME: 17.4 s — AB (ref 11.4–15.2)

## 2016-12-20 LAB — TROPONIN I: Troponin I: 0.04 ng/mL (ref <0.03)

## 2016-12-20 MED ORDER — SODIUM BICARBONATE 650 MG PO TABS
650.0000 mg | ORAL_TABLET | Freq: Two times a day (BID) | ORAL | Status: DC
  Administered 2016-12-20: 650 mg via ORAL
  Filled 2016-12-20: qty 1

## 2016-12-20 MED ORDER — FUROSEMIDE 40 MG PO TABS
80.0000 mg | ORAL_TABLET | Freq: Every day | ORAL | Status: DC
  Administered 2016-12-20: 80 mg via ORAL
  Filled 2016-12-20: qty 2

## 2016-12-20 MED ORDER — DEXTROSE 5 % IV SOLN
2.0000 g | INTRAVENOUS | Status: DC
  Administered 2016-12-20: 2 g via INTRAVENOUS
  Filled 2016-12-20: qty 2

## 2016-12-20 MED ORDER — ACETAMINOPHEN 325 MG PO TABS: 650.0000 mg | ORAL_TABLET | Freq: Four times a day (QID) | ORAL | Status: DC | PRN

## 2016-12-20 MED ORDER — ALBUMIN HUMAN 25 % IV SOLN
50.0000 g | Freq: Four times a day (QID) | INTRAVENOUS | Status: AC
Start: 2016-12-20 — End: 2016-12-21
  Administered 2016-12-20 – 2016-12-21 (×4): 50 g via INTRAVENOUS
  Filled 2016-12-20 (×5): qty 200

## 2016-12-20 MED ORDER — ALBUMIN HUMAN 25 % IV SOLN
25.0000 g | Freq: Four times a day (QID) | INTRAVENOUS | Status: DC
  Filled 2016-12-20: qty 100

## 2016-12-20 MED ORDER — SPIRONOLACTONE 25 MG PO TABS
25.0000 mg | ORAL_TABLET | Freq: Every day | ORAL | Status: DC
  Administered 2016-12-20: 25 mg via ORAL
  Filled 2016-12-20: qty 1

## 2016-12-20 MED ORDER — ACETAMINOPHEN 650 MG RE SUPP: 650.0000 mg | Freq: Four times a day (QID) | RECTAL | Status: DC | PRN

## 2016-12-20 MED ORDER — LACTULOSE 10 GM/15ML PO SOLN
30.0000 g | Freq: Two times a day (BID) | ORAL | Status: DC
  Administered 2016-12-20 – 2016-12-24 (×9): 30 g via ORAL
  Filled 2016-12-20 (×10): qty 45

## 2016-12-20 MED ORDER — PANTOPRAZOLE SODIUM 40 MG IV SOLR
40.0000 mg | INTRAVENOUS | Status: DC
  Administered 2016-12-20 – 2016-12-23 (×4): 40 mg via INTRAVENOUS
  Filled 2016-12-20 (×4): qty 40

## 2016-12-20 MED ORDER — OCTREOTIDE LOAD VIA INFUSION
50.0000 ug | Freq: Once | INTRAVENOUS | Status: AC
  Administered 2016-12-20: 50 ug via INTRAVENOUS
  Filled 2016-12-20: qty 25

## 2016-12-20 MED ORDER — NEOMYCIN SULFATE 500 MG PO TABS
1000.0000 mg | ORAL_TABLET | Freq: Two times a day (BID) | ORAL | Status: DC
Start: 2016-12-20 — End: 2016-12-20
  Administered 2016-12-20: 1000 mg via ORAL
  Filled 2016-12-20 (×2): qty 2

## 2016-12-20 MED ORDER — ONDANSETRON HCL 4 MG PO TABS: 4.0000 mg | ORAL_TABLET | Freq: Four times a day (QID) | ORAL | Status: DC | PRN

## 2016-12-20 MED ORDER — SODIUM CHLORIDE 0.9 % IV SOLN
50.0000 ug/h | INTRAVENOUS | Status: DC
  Administered 2016-12-20: 50 ug/h via INTRAVENOUS
  Filled 2016-12-20 (×2): qty 1

## 2016-12-20 MED ORDER — ONDANSETRON HCL 4 MG/2ML IJ SOLN: 4.0000 mg | Freq: Four times a day (QID) | INTRAMUSCULAR | Status: DC | PRN

## 2016-12-20 NOTE — Evaluation (Signed)
Physical Therapy Evaluation Patient Details Name: [name] House MRN: [id number] DOB: 03/10/1946 Today's Date: 12/20/2016   History of Present Illness  [name] House is a 71 y.o. male with medical history significant of NASH cirrhosis, gastric bypass, chronic iron def anemia, chronic thrombocytopenia, hepatic encephalopathy. Had a fall at home 12/20/16 and EMT called as he could not get up.  In the ER on exam patient was found to have large ascites with scrotal edema and lower extremity edema. Stool for occult blood was positive but patient has not complained of any active bleeding  Clinical Impression  The patient  Appears very weak. Able to ambulate in room with RW. Scheduled for paracentesis today. Pt admitted with above diagnosis. Pt currently with functional limitations due to the deficits listed below (see PT Problem List).  Pt will benefit from skilled PT to increase their independence and safety with mobility to allow discharge to the venue listed below.    patient reports only support is of neighbor and wife has pancreatic cancer.      Follow Up Recommendations Home health PT;Supervision/Assistance - 24 hour    Equipment Recommendations  Rolling walker with 5" wheels    Recommendations for Other Services       Precautions / Restrictions Precautions Precautions: Fall      Mobility  Bed Mobility Overal bed mobility: Needs Assistance Bed Mobility: Supine to Sit;Sit to Supine     Supine to sit: Supervision Sit to supine: Supervision      Transfers Overall transfer level: Needs assistance Equipment used: Rolling walker (2 wheeled) Transfers: Sit to/from Stand Sit to Stand: Min assist         General transfer comment: cues for safety  Ambulation/Gait Ambulation/Gait assistance: Min assist Ambulation Distance (Feet): 20 Feet Assistive device: Rolling walker (2 wheeled) Gait Pattern/deviations: Step-through pattern        Stairs            Wheelchair  Mobility    Modified Rankin (Stroke Patients Only)       Balance Overall balance assessment: Needs assistance;History of Falls Sitting-balance support: Feet supported;No upper extremity supported Sitting balance-Leahy Scale: Good     Standing balance support: Bilateral upper extremity supported;During functional activity Standing balance-Leahy Scale: Fair                               Pertinent Vitals/Pain Pain Assessment: No/denies pain    Home Living Family/patient expects to be discharged to:: Private residence   Available Help at Discharge: Other (Comment);Friend(s);Family Type of Home: House Home Access: Stairs to enter   [employer] of Steps: 1 Home Layout: One level Home Equipment: Cane - single point;Walker - standard Additional Comments: wife has pancreatic cancer per patient    Prior Function Level of Independence: Independent         Comments: pt has been caring for his wife who has pancreatic CA, curently undergoing chemo--he does meals/household mgmt, etc- taken from another encounter.     Hand Dominance        Extremity/Trunk Assessment   Upper Extremity Assessment Upper Extremity Assessment: Generalized weakness    Lower Extremity Assessment Lower Extremity Assessment: Generalized weakness    Cervical / Trunk Assessment Cervical / Trunk Assessment: Kyphotic  Communication      Cognition Arousal/Alertness: Awake/alert Behavior During Therapy: WFL for tasks assessed/performed;Flat affect Overall Cognitive Status: Within Functional Limits for tasks assessed  General Comments      Exercises     Assessment/Plan    PT Assessment Patient needs continued PT services  PT Problem List Decreased strength;Decreased activity tolerance;Decreased balance;Decreased mobility;Decreased knowledge of precautions;Decreased safety awareness;Decreased knowledge of use of DME;Pain       PT  Treatment Interventions DME instruction;Gait training;Functional mobility training;Therapeutic activities;Therapeutic exercise;Patient/family education    PT Goals (Current goals can be found in the Care Plan section)  Acute Rehab PT Goals Patient Stated Goal: to get better  PT Goal Formulation: With patient Time For Goal Achievement: 01/03/17 Potential to Achieve Goals: Good    Frequency Min 3X/week   Barriers to discharge Decreased caregiver support      Co-evaluation               End of Session   Activity Tolerance: Patient limited by fatigue;Patient limited by pain Patient left: in bed;with call bell/phone within reach;with bed alarm set Nurse Communication: Mobility status PT Visit Diagnosis: Unsteadiness on feet (R26.81);History of falling (Z91.81)    Functional Assessment Tool Used: Clinical judgement;AM-PAC 6 Clicks Basic Mobility Functional Limitation: Mobility: Walking and moving around Mobility: Walking and Moving Around Current Status (K9355): At least 20 percent but less than 40 percent impaired, limited or restricted Mobility: Walking and Moving Around Goal Status [phone]): 0 percent impaired, limited or restricted    Time: 1595-3967 PT Time Calculation (min) (ACUTE ONLY): 10 min   Charges:   PT Evaluation $PT Eval Low Complexity: 1 Procedure     PT G Codes:   PT G-Codes **NOT FOR INPATIENT CLASS** Functional Assessment Tool Used: Clinical judgement;AM-PAC 6 Clicks Basic Mobility Functional Limitation: Mobility: Walking and moving around Mobility: Walking and Moving Around Current Status (S8979): At least 20 percent but less than 40 percent impaired, limited or restricted Mobility: Walking and Moving Around Goal Status [phone]): 0 percent impaired, limited or restricted     Claretha Cooper 12/20/2016, 1:44 PM Tresa Endo PT [phone]

## 2016-12-20 NOTE — ED Provider Notes (Signed)
[facility] DEPT Provider Note: Georgena Spurling, MD, FACEP  CSN: [id number] MRN: [id number] ARRIVAL: 12/20/16 at [place]: WA18/WA18  By signing my name below, I, Oleh Genin, attest that this documentation has been prepared under the direction and in the presence of Shanon Rosser, MD. Electronically Signed: Oleh Genin, Scribe. 12/20/16. 2:01 AM.  CHIEF COMPLAINT  Weakness   HISTORY OF PRESENT ILLNESS  [name] House is a 71 y.o. male with history of NASH cirrhosis, prior gastric bypass, chronic iron-deficiency anemia, and hepatic encephalopathy who presents to the ED for evaluation of generalized weakness. This patient states that for the last 2 weeks he has felt generally weak which worsened tonight. According to EMS the patient was found on the floor, unable to get up due to severe weakness; he was there approximately 1 hour. He denies any chest pain or abdominal pain. No nausea vomiting or diarrhea at home. He did have one episode of vomiting on the ambulance on the way to the hospital. He states he has been eating and drinking.  Past Medical History:  Diagnosis Date  . Abdominal hernia   . Ascites   . Benign neoplasm of colon   . Cirrhosis ([place])   . Diabetes mellitus, type 2 ([place])    resolved with gastric bypass  . Diverticulosis of colon (without mention of hemorrhage)   . Esophageal varices ([place])   . Hyperlipemia   . Hypertension   . Iron deficiency anemia secondary to blood loss (chronic)   . Kidney stones   . Liver cyst   . Lymphopenia   . NASH (nonalcoholic steatohepatitis)   . Osteoarthritis of hip   . Renal cyst   . Splenomegaly   . Unspecified sleep apnea    Resolved    Past Surgical History:  Procedure Laterality Date  . ANKLE SURGERY     [place]  . COLONOSCOPY  2012  . GASTRIC BYPASS  2005  . KNEE SURGERY     [place]il  . POLYPECTOMY    . TOTAL HIP ARTHROPLASTY  2008   right    Family History  Problem Relation Age of Onset  . Heart attack Father      deceased due to MI  . Heart disease Father   . Diabetes Mother     deceased due to complications of DM  . Coronary artery disease Brother   . Colon cancer Neg Hx   . Stomach cancer Neg Hx   . Liver disease Neg Hx     Social History  Substance Use Topics  . Smoking status: Never Smoker  . Smokeless tobacco: Never Used  . Alcohol use No     Comment: none    Prior to Admission medications   Medication Sig Start Date End Date Taking? Authorizing Provider  diphenoxylate-atropine (LOMOTIL) 2.5-0.025 MG tablet Take 1 tablet by mouth 4 (four) times daily as needed for diarrhea or loose stools.   Yes Historical Provider, MD  furosemide (LASIX) 40 MG tablet Take 80 mg by mouth daily.   Yes Historical Provider, MD  lactulose, encephalopathy, (CHRONULAC) 10 GM/15ML SOLN Take 30 mLs (20 g total) by mouth 2 (two) times daily. 06/20/16  Yes Irene Shipper, MD  neomycin (MYCIFRADIN) 500 MG tablet Take 1,000 mg by mouth 2 (two) times daily.   Yes Historical Provider, MD  sodium bicarbonate 650 MG tablet Take 1 tablet (650 mg total) by mouth 2 (two) times daily. 11/17/16  Yes Mariel Aloe, MD  spironolactone (ALDACTONE) 25  MG tablet Take 25 mg by mouth daily.   Yes Historical Provider, MD    Allergies Oxycodone   REVIEW OF SYSTEMS  Negative except as noted here or in the History of Present Illness.   PHYSICAL EXAMINATION  Initial Vital Signs Blood pressure 135/66, pulse 93, temperature 97.5 F (36.4 C), temperature source Oral, resp. rate 19, height 6' (1.829 m), weight 230 lb (104.3 kg), SpO2 97 %.  Examination General: Well-developed, well-nourished male in no acute distress; appearance consistent with age of record HENT: normocephalic; atraumatic Eyes: pupils equal, round and reactive to light; extraocular muscles intact; pale conjunctivae Neck: supple Heart: regular rate and rhythm Lungs: clear to auscultation bilaterally Abdomen: soft; ascites present; nontender; bowel sounds  present Rectal: Per nursing staff, rectal exam yielded yellow stool that was heme-positive Genitourinary: Severe edema of the penis and scrotum.  Extremities: No deformity; full range of motion; pulses normal; 2+ pitting edema to the bilateral lower extremities.  Neurologic: Awake, alert and oriented; motor function intact in all extremities and symmetric; no facial droop Skin: Warm and dry Psychiatric: Flat affect  RESULTS  Summary of this visit's results, reviewed by myself:   EKG Interpretation  Date/Time:  Friday December 20 2016 02:02:55 EDT Ventricular Rate:  89 PR Interval:    QRS Duration: 137 QT Interval:  419 QTC Calculation: 510 R Axis:   -49 Text Interpretation:  Normal sinus rhythm Nonspecific IVCD with LAD Rate is faster Confirmed by Esaiah Wanless  MD, Jenny Reichmann ([id number]) on 12/20/2016 2:24:06 AM      Laboratory Studies: Results for orders placed or performed during the hospital encounter of 12/20/16 (from the past 24 hour(s))  POC occult blood, ED RN will collect     Status: Abnormal   Collection Time: 12/20/16  1:24 AM  Result Value Ref Range   Fecal Occult Bld POSITIVE (A) NEGATIVE  Type and screen [place]     Status: None   Collection Time: 12/20/16  1:36 AM  Result Value Ref Range   ABO/RH(D) O POS    Antibody Screen NEG    Sample Expiration 12/23/2016   CBC with Differential     Status: Abnormal   Collection Time: 12/20/16  1:37 AM  Result Value Ref Range   WBC 3.8 (L) 4.0 - 10.5 K/uL   RBC 3.43 (L) 4.22 - 5.81 MIL/uL   Hemoglobin 10.7 (L) 13.0 - 17.0 g/dL   HCT 30.3 (L) 39.0 - 52.0 %   MCV 88.3 78.0 - 100.0 fL   MCH 31.2 26.0 - 34.0 pg   MCHC 35.3 30.0 - 36.0 g/dL   RDW 14.5 11.5 - 15.5 %   Platelets 38 (L) 150 - 400 K/uL   Neutrophils Relative % 89 %   Neutro Abs 3.4 1.7 - 7.7 K/uL   Lymphocytes Relative 5 %   Lymphs Abs 0.2 (L) 0.7 - 4.0 K/uL   Monocytes Relative 5 %   Monocytes Absolute 0.2 0.1 - 1.0 K/uL   Eosinophils Relative 1 %    Eosinophils Absolute 0.0 0.0 - 0.7 K/uL   Basophils Relative 0 %   Basophils Absolute 0.0 0.0 - 0.1 K/uL  Comprehensive metabolic panel     Status: Abnormal   Collection Time: 12/20/16  1:37 AM  Result Value Ref Range   Sodium 140 135 - 145 mmol/L   Potassium 3.9 3.5 - 5.1 mmol/L   Chloride 108 101 - 111 mmol/L   CO2 22 22 - 32 mmol/L   Glucose,  Bld 184 (H) 65 - 99 mg/dL   BUN 25 (H) 6 - 20 mg/dL   Creatinine, Ser 1.89 (H) 0.61 - 1.24 mg/dL   Calcium 8.4 (L) 8.9 - 10.3 mg/dL   Total Protein 5.8 (L) 6.5 - 8.1 g/dL   Albumin 3.6 3.5 - 5.0 g/dL   AST 47 (H) 15 - 41 U/L   ALT 27 17 - 63 U/L   Alkaline Phosphatase 163 (H) 38 - 126 U/L   Total Bilirubin 2.4 (H) 0.3 - 1.2 mg/dL   GFR calc non Af Amer 35 (L) >60 mL/min   GFR calc Af Amer 40 (L) >60 mL/min   Anion gap 10 5 - 15  Lipase, blood     Status: Abnormal   Collection Time: 12/20/16  1:37 AM  Result Value Ref Range   Lipase 56 (H) 11 - 51 U/L  APTT     Status: None   Collection Time: 12/20/16  1:53 AM  Result Value Ref Range   aPTT 27 24 - 36 seconds  Protime-INR     Status: Abnormal   Collection Time: 12/20/16  1:53 AM  Result Value Ref Range   Prothrombin Time 17.4 (H) 11.4 - 15.2 seconds   INR 1.41   Ammonia     Status: Abnormal   Collection Time: 12/20/16  2:00 AM  Result Value Ref Range   Ammonia 64 (H) 9 - 35 umol/L  Urinalysis, Routine w reflex microscopic     Status: Abnormal   Collection Time: 12/20/16  2:41 AM  Result Value Ref Range   Color, Urine YELLOW YELLOW   APPearance CLEAR CLEAR   Specific Gravity, Urine 1.010 1.005 - 1.030   pH 7.0 5.0 - 8.0   Glucose, UA NEGATIVE NEGATIVE mg/dL   Hgb urine dipstick NEGATIVE NEGATIVE   Bilirubin Urine NEGATIVE NEGATIVE   Ketones, ur 5 (A) NEGATIVE mg/dL   Protein, ur NEGATIVE NEGATIVE mg/dL   Nitrite NEGATIVE NEGATIVE   Leukocytes, UA NEGATIVE NEGATIVE   Imaging Studies: No results found.  ED COURSE  Nursing notes and initial vitals signs, including pulse  oximetry, reviewed.  Vitals:   12/20/16 0200 12/20/16 0230 12/20/16 0232 12/20/16 0300  BP: 130/63 124/71 124/71 111/89  Pulse: 89 85 99 82  Resp: 16 15 17 19   Temp:      TempSrc:      SpO2: 100% 96% 96% 100%  Weight:      Height:        PROCEDURES    ED DIAGNOSES     ICD-9-CM ICD-10-CM   1. Generalized weakness 780.79 R53.1   2. Poorly controlled ascites V49.89 R18.8     I personally performed the services described in this documentation, which was scribed in my presence. The recorded information has been reviewed and is accurate.    Shanon Rosser, MD 12/20/16 [phone]

## 2016-12-20 NOTE — ED Notes (Signed)
Bed: ZF58 Expected date: 12/20/16 Expected time: 12:47 AM Means of arrival: Ambulance Comments: EMS 71 yo fall

## 2016-12-20 NOTE — Progress Notes (Signed)
PHARMACY NOTE -  ANTIBIOTIC RENAL DOSE ADJUSTMENT   Request received for Pharmacy to assist with antibiotic renal dose adjustment.  Patient has been initiated on ceftriaxone 2gm iv q24hr for Intra-abdominal infection. SCr 1.89, estimated CrCl ~45 ml/min Current dosage is appropriate and need for further dosage adjustment appears unlikely at present. Will sign off at this time.  Please reconsult if a change in clinical status warrants re-evaluation of dosage.

## 2016-12-20 NOTE — Procedures (Signed)
Ultrasound-guided diagnostic and therapeutic paracentesis performed yielding 10.1 liters of clear yellow colored fluid. No immediate complications.  Gustabo Gordillo E 3:56 PM 12/20/2016

## 2016-12-20 NOTE — Consult Note (Signed)
Consultation  Referring Provider:  Dr. Waldron Labs    Primary Care Physician:  [name], MD Primary Gastroenterologist:   Dr. Henrene Pastor      Reason for Consultation:  Abnormal CT liver, NASH, Occult + stool , Ascites          HPI:   [name] House is a 71 y.o. 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   Today, the patient tells me that he was walking to the bathroom last night and is "leg gave way" and he fell onto the floor. After the fall patient he was unable to get up and his wife called EMS and he was brought to the ER.  He tells me that over the past 6 weeks he has noticed that he has been getting weaker, unable to get back up from using the toilet, finding it harder to stand from sitting, etc.     He also tells me that about a mos ago his PCP "changed my diuretics", since that time he has noticed that "my penis has gotten larger/swollen". He tells me it was "so big I had to sit to use the bathroom". He tells me that already since admission this is "much better".  He denies any increase in abdominal distension and tells me he has chronic leg swelling for which he wears support hose. None of this has worsened per patient.   Patient denies any other complaints and tells me he has been taking all of his medications as prescribed with no change in diet.  He denies SOB, dizziness, abdominal pain, heartburn, reflux, constipation or diarrhea.  ED Course: In the ER on exam patient was found to have large ascites with scrotal edema and lower extremity edema. Stool for occult blood was positive but patient has not complained of any active bleeding. Hemoglobin appears to be at baseline with creatinine mildly  elevated from baseline. EKG was showing normal sinus rhythm with x-ray of the pelvis and chest x-ray was unremarkable.   Previous GI history: 06/27/16-office visit, Dr. Perry:in office for routine follow-up for [name], had been having diarrhea and symptoms of mild encephalopathy, lactulose was helping him 30 mL's twice a day he described 4 loose bowel movements in the morning he took 2 Lomotil during the day he was on Lasix 40 and Aldactone 100 daily;plan: no change to current meds 08/02/14-colonoscopy, Dr. Henrene Pastor: Impression:single polyp measuring 8 mm in size was found in the ascending colon, moderate diverticulosis in the sigmoid colon, portal hypertensive colopathy noted;recommendations:follow-up in 5 years if medically fit 08/30/13-EGD, Dr. Henrene Pastor: Impression:1 column grade 2 varix without stigmata, normal pouch and jejunum post gastric bypass  Past Medical History:  Diagnosis Date  . Abdominal hernia   . Ascites   . Benign neoplasm of colon   . Cirrhosis ([place])   . Diabetes mellitus, type 2 ([place])    resolved with gastric bypass  . Diverticulosis of colon (without mention of hemorrhage)   . Esophageal varices ([place])   . Hyperlipemia   . Hypertension   . Iron deficiency anemia secondary to blood loss (chronic)   . Kidney stones   . Liver cyst   . Lymphopenia   . NASH (nonalcoholic steatohepatitis)   .  Osteoarthritis of hip   . Renal cyst   . Splenomegaly   . Unspecified sleep apnea    Resolved    Past Surgical History:  Procedure Laterality Date  . ANKLE SURGERY     Bil  . COLONOSCOPY  2012  . GASTRIC BYPASS  2005  . KNEE SURGERY     Bil  . POLYPECTOMY    . TOTAL HIP ARTHROPLASTY  2008   right    Family History  Problem Relation Age of Onset  . Heart attack Father     deceased due to MI  . Heart disease Father   . Diabetes Mother     deceased due to complications of DM  . Coronary artery disease Brother   . Colon cancer Neg Hx   . Stomach cancer Neg Hx   . Liver  disease Neg Hx     Social History  Substance Use Topics  . Smoking status: Never Smoker  . Smokeless tobacco: Never Used  . Alcohol use No     Comment: none    Prior to Admission medications   Medication Sig Start Date End Date Taking? Authorizing Provider  diphenoxylate-atropine (LOMOTIL) 2.5-0.025 MG tablet Take 1 tablet by mouth 4 (four) times daily as needed for diarrhea or loose stools.   Yes Historical Provider, MD  furosemide (LASIX) 40 MG tablet Take 80 mg by mouth daily.   Yes Historical Provider, MD  lactulose, encephalopathy, (CHRONULAC) 10 GM/15ML SOLN Take 30 mLs (20 g total) by mouth 2 (two) times daily. 06/20/16  Yes Irene Shipper, MD  neomycin (MYCIFRADIN) 500 MG tablet Take 1,000 mg by mouth 2 (two) times daily.   Yes Historical Provider, MD  sodium bicarbonate 650 MG tablet Take 1 tablet (650 mg total) by mouth 2 (two) times daily. 11/17/16  Yes Mariel Aloe, MD  spironolactone (ALDACTONE) 25 MG tablet Take 25 mg by mouth daily.   Yes Historical Provider, MD    Current Facility-Administered Medications  Medication Dose Route Frequency Provider Last Rate Last Dose  . acetaminophen (TYLENOL) tablet 650 mg  650 mg Oral Q6H PRN Rise Patience, MD       Or  . acetaminophen (TYLENOL) suppository 650 mg  650 mg Rectal Q6H PRN Rise Patience, MD      . cefTRIAXone (ROCEPHIN) 2 g in dextrose 5 % 50 mL IVPB  2 g Intravenous Q24H Rise Patience, MD   2 g at 12/20/16 0645  . furosemide (LASIX) tablet 80 mg  80 mg Oral Daily Rise Patience, MD   80 mg at 12/20/16 1051  . lactulose (CHRONULAC) 10 GM/15ML solution 30 g  30 g Oral BID Rise Patience, MD   30 g at 12/20/16 1051  . neomycin (MYCIFRADIN) tablet 1,000 mg  1,000 mg Oral BID Rise Patience, MD   1,000 mg at 12/20/16 1053  . octreotide (SANDOSTATIN) 500 mcg in sodium chloride 0.9 % 250 mL (2 mcg/mL) infusion  50 mcg/hr Intravenous Continuous Rise Patience, MD 25 mL/hr at 12/20/16 0645 50  mcg/hr at 12/20/16 0645  . ondansetron (ZOFRAN) tablet 4 mg  4 mg Oral Q6H PRN Rise Patience, MD       Or  . ondansetron [facility]) injection 4 mg  4 mg Intravenous Q6H PRN Rise Patience, MD      . pantoprazole (PROTONIX) injection 40 mg  40 mg Intravenous Q24H Rise Patience, MD   40 mg at 12/20/16 0645  .  sodium bicarbonate tablet 650 mg  650 mg Oral BID Rise Patience, MD   650 mg at 12/20/16 1051  . spironolactone (ALDACTONE) tablet 25 mg  25 mg Oral Daily Rise Patience, MD   25 mg at 12/20/16 1051    Allergies as of 12/20/2016 - Review Complete 12/20/2016  Allergen Reaction Noted  . Oxycodone Other (See Comments) 07/05/2013     Review of Systems:    Constitutional: Positive for weakness No weight loss, fever or chills Skin: No rash  Cardiovascular: No chest pain Respiratory: No SOB  Gastrointestinal: See HPI and otherwise negative Genitourinary: No dysuria or change in urinary frequency Neurological: No headache, dizziness or syncope Musculoskeletal: No new muscle or joint pain Hematologic: No bleeding Psychiatric: No history of depression or anxiety   Physical Exam:  Vital signs in last 24 hours: Temp:  [97.5 F (36.4 C)-97.9 F (36.6 C)] 97.9 F (36.6 C) (03/16 0910) Pulse Rate:  [76-99] 76 (03/16 0910) Resp:  [15-19] 19 (03/16 0910) BP: (106-135)/(44-89) 111/62 (03/16 0910) SpO2:  [94 %-100 %] 100 % (03/16 0910) Weight:  [230 lb (104.3 kg)] 230 lb (104.3 kg) (03/16 0425) Last BM Date: 12/19/16 General:   Pleasant Caucasian male appears to be in NAD, Well developed, Well nourished, alert and cooperative Head:  Normocephalic and atraumatic. Eyes:   PEERL, EOMI. No icterus. Conjunctiva pink. Ears:  Normal auditory acuity. Neck:  Supple Throat: Oral cavity and pharynx without inflammation, swelling or lesion.  Lungs: Respirations even and unlabored. Lungs clear to auscultation bilaterally.   No wheezes, crackles, or rhonchi.  Heart: Normal  S1, S2. No MRG. Regular rate and rhythm. No peripheral edema, cyanosis or pallor.  Abdomen:  Soft, mild distension, no fluid wave appreciated nontender. No rebound or guarding. Normal bowel sounds. No appreciable masses or hepatomegaly. Rectal:  Not performed.  Msk:  Symmetrical without gross deformities.  Extremities:  Without edema, no deformity or joint abnormality. Neurologic:  Alert and  oriented x4;  grossly normal neurologically.  Skin:   Dry and intact without significant lesions or rashes. Psychiatric: Demonstrates good judgement and reason without abnormal affect or behaviors.   LAB RESULTS:  Recent Labs  12/20/16 0137 12/20/16 0536  WBC 3.8* 3.0*  HGB 10.7* 9.7*  HCT 30.3* 28.1*  PLT 38* 40*   BMET  Recent Labs  12/20/16 0137 12/20/16 0541  NA 140 141  K 3.9 4.0  CL 108 110  CO2 22 24  GLUCOSE 184* 166*  BUN 25* 25*  CREATININE 1.89* 1.96*  CALCIUM 8.4* 8.5*   LFT  Recent Labs  12/20/16 0137  PROT 5.8*  ALBUMIN 3.6  AST 47*  ALT 27  ALKPHOS 163*  BILITOT 2.4*   PT/INR  Recent Labs  12/20/16 0153  LABPROT 17.4*  INR 1.41    STUDIES: Dg Pelvis Portable  Result Date: 12/20/2016 CLINICAL DATA:  Fever off and on for 1 week. Found on the floor this morning and unable to get of. EXAM: PORTABLE PELVIS 1-2 VIEWS COMPARISON:  CT abdomen and pelvis 11/07/2015 FINDINGS: Postoperative right hip arthroplasty. Degenerative changes in the lower lumbar spine and left hip. No acute fracture or dislocation of the pelvis. SI joints and symphysis pubis are not displaced. IMPRESSION: No acute bony abnormalities. Degenerative changes in the lower lumbar spine and left hip. Right hip arthroplasty. Electronically Signed   By: Lucienne Capers M.D.   On: 12/20/2016 04:01   Dg Chest Port 1 View  Result Date: 12/20/2016 CLINICAL  DATA:  Fever off and on for 1 week.  Fall. EXAM: PORTABLE CHEST 1 VIEW COMPARISON:  11/13/2016 FINDINGS: Shallow inspiration. Heart size and  pulmonary vascularity are normal. No focal airspace disease or consolidation in the lungs. No pneumothorax. No blunting of costophrenic angles. Calcified and tortuous aorta. IMPRESSION: Shallow inspiration.  No evidence of active pulmonary disease. Electronically Signed   By: Lucienne Capers M.D.   On: 12/20/2016 04:02   CT ABDOMEN AND PELVIS WITH CONTRAST 10/2015 images viewed Gatha Mayer, MD, [facility]  TECHNIQUE: Multidetector CT imaging of the abdomen and pelvis was performed using the standard protocol following bolus administration of intravenous contrast.  CONTRAST:  173mL OMNIPAQUE IOHEXOL 300 MG/ML  SOLN  COMPARISON:  07/02/2013  FINDINGS: Lower chest: Large esophageal varices of increased since previous study.  Hepatobiliary: Hepatic cirrhosis is again demonstrated. Recanalization of periumbilical veins again seen. Cysts in both right and left hepatic lobes remain stable. A 16 mm hypervascular focus is seen in segment 4A of the left hepatic lobe on image 15/series 2 which was not seen previously and is suspicious for a small hepatocellular carcinoma. Portal veins remain patent. Prior cholecystectomy again noted. No evidence of biliary ductal dilatation.  Pancreas: No mass, inflammatory changes, or other significant abnormality.  Spleen: Splenomegaly shows no significant change, consistent with portal venous hypertension. No splenic masses identified.  Adrenals/Urinary Tract: No masses identified. No evidence of hydronephrosis. Small bilateral renal calculi again seen as well as simple appearing right renal cysts.  Stomach/Bowel: Previous gastric bypass surgery again noted. No evidence of bowel obstruction. Mild to moderate ascites is increased since previous study as well as diffuse body wall edema.  Vascular/Lymphatic: No pathologically enlarged lymph nodes. No evidence of abdominal aortic aneurysm.  Reproductive: No mass or other significant  abnormality.  Other: Right hip prosthesis causes significant beam hardening artifact through the inferior pelvis. Small left inguinal hernia containing only fat is increased in size since previous study.  Heterogeneous mass is seen in the anterior abdominal wall subcutaneous tissues in the periumbilical region. This measures 4.9 x 6.8 cm compared to 3.7 x 5.0 cm previously, and has a more complex appearance. Differential diagnosis includes enlarging soft tissue mass or increasing complex periumbilical hernia possibly due to incarceration/ischemia. No evidence herniated bowel loops.  Musculoskeletal: Right hip prosthesis results in significant beam hardening artifact through the inferior pelvis.  IMPRESSION: Hepatic cirrhosis with increased large esophageal varices and increased ascites and diffuse body wall edema compared to previous study.  Stable splenomegaly and recanalization of periumbilical veins, also consistent with portal venous hypertension.  1.6 cm hypervascular nodule In segment 4A of left hepatic lobe. Small hepatocellular carcinoma cannot be excluded. Recommend abdomen MRI without and with contrast for further characterization.  Increased size of 7 cm complex mass in the periumbilical region. Differential diagnosis includes enlarging soft tissue mass or increased complex periumbilical hernia possibly due to incarceration/ ischemia. No evidence of herniated bowel loops. This could also be further assessed by abdomen MRI without and with contrast.  Bilateral nonobstructive nephrolithiasis. New left inguinal hernia containing only fat.   Electronically Signed   By: Earle Gell M.D.   On: 11/07/2015 10:22   PREVIOUS ENDOSCOPIES:            See HPI (more in chart review)   Impression / Plan:   Impression: 1. NASH: paracentesis has been ordered by the hospitalist and patient placed on empiric antibiotics until then 2. Positive Hemoccult: patient  is currently on octreotide  and Protonix per hospitalist due to his history of varices in 2014 3. Weakness:consider relation to above 4. Anemia:consider relation to above 5. CK D stage III 6. Chronic thrombocytopenia: from cirrhosis 7. Abnormal CT: From Jan 2017 above-? If need follow up with MRI, etc  Plan: 1. Agree with attempting paracentesis-though question if there is enough fluid for analysis 2. Question whether antibiotics are necessary as patient with no increase in WBC count and no abdominal pain, no change in abdominal distension 3. Continue Protonix 4. Do not believe patient needs Octreotide, will leave to Dr. Carlean Purl 5. Will discuss CT with Dr. Carlean Purl, this was from Jan 2017 and appears to be acknowledged by Dr. Henrene Pastor with no recs after that time, will rediscuss with Dr. Chipper Oman would likely benefit from further imaging 6. Continue current diuretic dosing, though this will likely need to be adjusted prior to discharge 7. Please await any further recommendations from Dr. Carlean Purl  Thank you for your kind consultation, we will continue to follow.  Lavone Nian Lemmon  12/20/2016, 11:14 AM Pager #: [phone]     [facility] Attending   I have taken an interval history, reviewed the chart and examined the patient. I agree with the Advanced Practitioner's note, impression and recommendations.     Complicated patient with ascites, NASH cirrhosis, and prior imaging showing small liver lesion ? [place] - also w/ CKD. Heme + no GI hemorrhage  I have dced octreotide as not having variceal hemorrhage it seems. Ascites does not show SBP - stop ceftriaxone. DC diuretics and dc neomycin in CKD w/ hopes renewing diuretics but would not restart neomycin due to nephrotoxicity. Not encephalopathic at this time - consider Xifaxan if necessary. Is on lactulose.  Schedule MR liver w/ w/o contrast to evaluate liver lesion ? [facility]  Will f/u - Dr. Benson Norway covering tomorrow and Elisha Ponder, MD, [facility] [phone] (pager) [phone] after 5 PM, weekends and holidays  12/20/2016 5:56 PM

## 2016-12-20 NOTE — Progress Notes (Signed)
CRITICAL VALUE ALERT  Critical value received:  Troponin 0.04  Date of notification:  12/20/2016  Time of notification: 0655  Critical value read back: yes  Nurse who received alert: Petra Kuba, RN  MD notified (1st page):  Hal Hope  Time of first page:  [phone]  MD notified (2nd page):  Time of second page:  Responding MD:    Time MD responded:

## 2016-12-20 NOTE — Progress Notes (Signed)
PROGRESS NOTE                                                                                                                                                                                                             Patient Demographics:    [name] House, is a 71 y.o. male, DOB - [birth year], HDQ:[id number]  Admit date - 12/20/2016   Admitting Physician Rise Patience, MD  Outpatient Primary MD for the patient is [name], MD  LOS - 0  Outpatient Specialists: GI Dr Henrene Pastor  Chief Complaint  Patient presents with  . Weakness       Brief Narrative   71 y.o. male with history of nonalcoholic liver cirrhosis, chronic anemia and thrombocytopenia with history of esophageal varices was brought to the ER after patient had a fall at home and was found weak to ambulate   Subjective:    [name] House today has, No headache, No chest pain, No abdominal pain - No Nausea, No Cough - SOB.    Assessment  & Plan :    Principal Problem:   Ascites Active Problems:   Iron deficiency anemia   Sleep apnea   S/P gastric bypass   Weakness   Liver cirrhosis secondary to NASH (HCC)   CKD (chronic kidney disease) stage 4, GFR 15-29 ml/min (HCC)  Liver cirrhosis secondary to [facility] - Patient presents with worsening lower extremity/scrotal edema, most likely related to decompensated liver failure. - Paracentesis pending, meanwhile continue with empiric antibiotic coverage - We'll start on lactulose given elevated ammonia level - Opinion with lactulose and Aldactone - Continue with neomycin  Hemoccult-positive stool - No evidence of significant bleeding, glycohemoglobin at baseline, patient with known history of varices, continue empirically on octreotide pending further GI recommendation  AKI on CK D stage III - Continueto monitor closely on current dose Lasix, follow on urine sodium, recheck BMP in a.m.  Chronic thrombocytopenia - Secondary to liver  cirrhosis  Weakness and debilitation - Consult PT  Abnormal CT finding in January 2017    Code Status : Full  Family Communication  : none at bedside  Disposition Plan  : pending further workup  Consults  :  GI  Procedures  : None  DVT Prophylaxis  :   SCDs   Lab Results  Component Value Date   PLT  40 (L) 12/20/2016    Antibiotics  :    Anti-infectives    Start     Dose/Rate Route Frequency Ordered Stop   12/20/16 1000  neomycin (MYCIFRADIN) tablet 1,000 mg     1,000 mg Oral 2 times daily 12/20/16 0511     12/20/16 0600  cefTRIAXone (ROCEPHIN) 2 g in dextrose 5 % 50 mL IVPB     2 g 100 mL/hr over 30 Minutes Intravenous Every 24 hours 12/20/16 0524          Objective:   Vitals:   12/20/16 0330 12/20/16 0425 12/20/16 0910 12/20/16 1151  BP:  130/67 111/62 126/62  Pulse: 79 80 76 75  Resp: 17 16 19 19   Temp:  97.8 F (36.6 C) 97.9 F (36.6 C) 97.9 F (36.6 C)  TempSrc:  Oral Oral Oral  SpO2: 94% 100% 100% 99%  Weight:  104.3 kg (230 lb)    Height:  6' (1.829 m)      Wt Readings from Last 3 Encounters:  12/20/16 104.3 kg (230 lb)  11/13/16 91.9 kg (202 lb 9.6 oz)  06/27/16 84.1 kg (185 lb 6 oz)     Intake/Output Summary (Last 24 hours) at 12/20/16 1329 Last data filed at 12/20/16 1152  Gross per 24 hour  Intake              110 ml  Output              725 ml  Net             -615 ml     Physical Exam  Awake Alert, Oriented X 3,Frail, chronically ill-appearing Supple Neck,No JVD,  Symmetrical Chest wall movement, Good air movement bilaterally, CTAB RRR,No Gallops,Rubs or new Murmurs, No Parasternal Heave +ve B.Sounds, Abd Soft, some shifting dullness, No tenderness, No rebound - guarding or rigidity. No Cyanosis, Clubbing , +2 edema    Data Review:    CBC  Recent Labs Lab 12/20/16 0137 12/20/16 0536  WBC 3.8* 3.0*  HGB 10.7* 9.7*  HCT 30.3* 28.1*  PLT 38* 40*  MCV 88.3 86.7  MCH 31.2 29.9  MCHC 35.3 34.5  RDW 14.5 14.4    LYMPHSABS 0.2*  --   MONOABS 0.2  --   EOSABS 0.0  --   BASOSABS 0.0  --     Chemistries   Recent Labs Lab 12/20/16 0137 12/20/16 0541  NA 140 141  K 3.9 4.0  CL 108 110  CO2 22 24  GLUCOSE 184* 166*  BUN 25* 25*  CREATININE 1.89* 1.96*  CALCIUM 8.4* 8.5*  AST 47*  --   ALT 27  --   ALKPHOS 163*  --   BILITOT 2.4*  --    ------------------------------------------------------------------------------------------------------------------ No results for input(s): CHOL, HDL, LDLCALC, TRIG, CHOLHDL, LDLDIRECT in the last 72 hours.  Lab Results  Component Value Date   HGBA1C 7.0 (H) 02/14/2015   ------------------------------------------------------------------------------------------------------------------ No results for input(s): TSH, T4TOTAL, T3FREE, THYROIDAB in the last 72 hours.  Invalid input(s): FREET3 ------------------------------------------------------------------------------------------------------------------ No results for input(s): VITAMINB12, FOLATE, FERRITIN, TIBC, IRON, RETICCTPCT in the last 72 hours.  Coagulation profile  Recent Labs Lab 12/20/16 0153  INR 1.41    No results for input(s): DDIMER in the last 72 hours.  Cardiac Enzymes  Recent Labs Lab 12/20/16 0541  TROPONINI 0.04*   ------------------------------------------------------------------------------------------------------------------ No results found for: BNP  Inpatient Medications  Scheduled Meds: . cefTRIAXone (ROCEPHIN)  IV  2 g Intravenous Q24H  .  furosemide  80 mg Oral Daily  . lactulose  30 g Oral BID  . neomycin  1,000 mg Oral BID  . pantoprazole (PROTONIX) IV  40 mg Intravenous Q24H  . sodium bicarbonate  650 mg Oral BID  . spironolactone  25 mg Oral Daily   Continuous Infusions: . octreotide  (SANDOSTATIN)    IV infusion 50 mcg/hr (12/20/16 0645)   PRN Meds:.acetaminophen **OR** acetaminophen, ondansetron **OR** ondansetron (ZOFRAN) IV  Micro Results No  results found for this or any previous visit (from the past 240 hour(s)).  Radiology Reports Dg Pelvis Portable  Result Date: 12/20/2016 CLINICAL DATA:  Fever off and on for 1 week. Found on the floor this morning and unable to get of. EXAM: PORTABLE PELVIS 1-2 VIEWS COMPARISON:  CT abdomen and pelvis 11/07/2015 FINDINGS: Postoperative right hip arthroplasty. Degenerative changes in the lower lumbar spine and left hip. No acute fracture or dislocation of the pelvis. SI joints and symphysis pubis are not displaced. IMPRESSION: No acute bony abnormalities. Degenerative changes in the lower lumbar spine and left hip. Right hip arthroplasty. Electronically Signed   By: Lucienne Capers M.D.   On: 12/20/2016 04:01   Dg Chest Port 1 View  Result Date: 12/20/2016 CLINICAL DATA:  Fever off and on for 1 week.  Fall. EXAM: PORTABLE CHEST 1 VIEW COMPARISON:  11/13/2016 FINDINGS: Shallow inspiration. Heart size and pulmonary vascularity are normal. No focal airspace disease or consolidation in the lungs. No pneumothorax. No blunting of costophrenic angles. Calcified and tortuous aorta. IMPRESSION: Shallow inspiration.  No evidence of active pulmonary disease. Electronically Signed   By: Lucienne Capers M.D.   On: 12/20/2016 04:02     [facility], Inanna Telford M.D on 12/20/2016 at 1:29 PM  Between 7am to 7pm - Pager - [phone]  After 7pm go to [email or web address] - password [facility]  [facility] -  Office  [phone]

## 2016-12-20 NOTE — H&P (Signed)
History and Physical    [name] House ENI:[id number] DOB: 1946/06/22 DOA: 12/20/2016  PCP: [name], MD  Patient coming from: Home.  Chief Complaint: Weakness and fall.  HPI: [name] House is a 71 y.o. male with history of nonalcoholic liver cirrhosis, chronic anemia and thrombocytopenia with history of esophageal varices was brought to the ER after patient had a fall at home and was found weak to ambulate. Patient states last night patient was walking to the bathroom and patient's leg gave way and fell onto the floor. Denies hitting his head almost losing consciousness. After the fall patient was unable to get up. Patient's wife called EMS and patient was brought to the ER. Patient denies any chest pain or shortness of breath nausea vomiting or diarrhea.   ED Course: In the ER on exam patient is found to have large ascites with scrotal edema and lower extremity edema. Stool for occult blood was positive but patient has not complained of any active bleeding. Hemoglobin appears to be at baseline with creatinine mildly elevated from baseline. EKG was showing normal sinus rhythm with x-ray of the pelvis and chest x-ray was unremarkable. Patient is being admitted for further observation for weakness possible GI bleed and also needs further management of ascites.  Review of Systems: As per HPI, rest all negative.   Past Medical History:  Diagnosis Date  . Abdominal hernia   . Ascites   . Benign neoplasm of colon   . Cirrhosis ([place])   . Diabetes mellitus, type 2 ([place])    resolved with gastric bypass  . Diverticulosis of colon (without mention of hemorrhage)   . Esophageal varices ([place])   . Hyperlipemia   . Hypertension   . Iron deficiency anemia secondary to blood loss (chronic)   . Kidney stones   . Liver cyst   . Lymphopenia   . NASH (nonalcoholic steatohepatitis)   . Osteoarthritis of hip   . Renal cyst   . Splenomegaly   . Unspecified sleep apnea    Resolved    Past  Surgical History:  Procedure Laterality Date  . ANKLE SURGERY     Bil  . COLONOSCOPY  2012  . GASTRIC BYPASS  2005  . KNEE SURGERY     Bil  . POLYPECTOMY    . TOTAL HIP ARTHROPLASTY  2008   right     reports that he has never smoked. He has never used smokeless tobacco. He reports that he does not drink alcohol or use drugs.  Allergies  Allergen Reactions  . Oxycodone Other (See Comments)    Reaction:  Caused pt to fall     Family History  Problem Relation Age of Onset  . Heart attack Father     deceased due to MI  . Heart disease Father   . Diabetes Mother     deceased due to complications of DM  . Coronary artery disease Brother   . Colon cancer Neg Hx   . Stomach cancer Neg Hx   . Liver disease Neg Hx     Prior to Admission medications   Medication Sig Start Date End Date Taking? Authorizing Provider  diphenoxylate-atropine (LOMOTIL) 2.5-0.025 MG tablet Take 1 tablet by mouth 4 (four) times daily as needed for diarrhea or loose stools.   Yes Historical Provider, MD  furosemide (LASIX) 40 MG tablet Take 80 mg by mouth daily.   Yes Historical Provider, MD  lactulose, encephalopathy, (CHRONULAC) 10 GM/15ML SOLN Take 30 mLs (  20 g total) by mouth 2 (two) times daily. 06/20/16  Yes Irene Shipper, MD  neomycin (MYCIFRADIN) 500 MG tablet Take 1,000 mg by mouth 2 (two) times daily.   Yes Historical Provider, MD  sodium bicarbonate 650 MG tablet Take 1 tablet (650 mg total) by mouth 2 (two) times daily. 11/17/16  Yes Mariel Aloe, MD  spironolactone (ALDACTONE) 25 MG tablet Take 25 mg by mouth daily.   Yes Historical Provider, MD    Physical Exam: Vitals:   12/20/16 0232 12/20/16 0300 12/20/16 0330 12/20/16 0425  BP: 124/71 111/89  130/67  Pulse: 99 82 79 80  Resp: 17 19 17 16   Temp:    97.8 F (36.6 C)  TempSrc:    Oral  SpO2: 96% 100% 94% 100%  Weight:    104.3 kg (230 lb)  Height:    6' (1.829 m)      Constitutional: Moderately built and nourished. Vitals:    12/20/16 0232 12/20/16 0300 12/20/16 0330 12/20/16 0425  BP: 124/71 111/89  130/67  Pulse: 99 82 79 80  Resp: 17 19 17 16   Temp:    97.8 F (36.6 C)  TempSrc:    Oral  SpO2: 96% 100% 94% 100%  Weight:    104.3 kg (230 lb)  Height:    6' (1.829 m)   Eyes: Anicteric no pallor. ENMT: No discharge from the ears eyes nose or mouth. Neck: No mass felt. No JVD appreciated. Respiratory: No rhonchi or crepitations. Cardiovascular: S1-S2 heard no murmurs appreciated. Abdomen: Distended abdomen no guarding or rigidity. Musculoskeletal: Bilateral lower extremity edema. Skin: Chronic skin changes. Neurologic: Alert awake oriented to time place and person. Moves all extremities. Psychiatric: Appears normal. Normal affect.   Labs on Admission: I have personally reviewed following labs and imaging studies  CBC:  Recent Labs Lab 12/20/16 0137  WBC 3.8*  NEUTROABS 3.4  HGB 10.7*  HCT 30.3*  MCV 88.3  PLT 38*   Basic Metabolic Panel:  Recent Labs Lab 12/20/16 0137  NA 140  K 3.9  CL 108  CO2 22  GLUCOSE 184*  BUN 25*  CREATININE 1.89*  CALCIUM 8.4*   GFR: Estimated Creatinine Clearance: 46.1 mL/min (A) (by C-G formula based on SCr of 1.89 mg/dL (H)). Liver Function Tests:  Recent Labs Lab 12/20/16 0137  AST 47*  ALT 27  ALKPHOS 163*  BILITOT 2.4*  PROT 5.8*  ALBUMIN 3.6    Recent Labs Lab 12/20/16 0137  LIPASE 56*    Recent Labs Lab 12/20/16 0200  AMMONIA 64*   Coagulation Profile:  Recent Labs Lab 12/20/16 0153  INR 1.41   Cardiac Enzymes: No results for input(s): CKTOTAL, CKMB, CKMBINDEX, TROPONINI in the last 168 hours. BNP (last 3 results) No results for input(s): PROBNP in the last 8760 hours. HbA1C: No results for input(s): HGBA1C in the last 72 hours. CBG: No results for input(s): GLUCAP in the last 168 hours. Lipid Profile: No results for input(s): CHOL, HDL, LDLCALC, TRIG, CHOLHDL, LDLDIRECT in the last 72 hours. Thyroid Function  Tests: No results for input(s): TSH, T4TOTAL, FREET4, T3FREE, THYROIDAB in the last 72 hours. Anemia Panel: No results for input(s): VITAMINB12, FOLATE, FERRITIN, TIBC, IRON, RETICCTPCT in the last 72 hours. Urine analysis:    Component Value Date/Time   COLORURINE YELLOW 12/20/2016 0241   APPEARANCEUR CLEAR 12/20/2016 0241   LABSPEC 1.010 12/20/2016 0241   PHURINE 7.0 12/20/2016 0241   GLUCOSEU NEGATIVE 12/20/2016 0241   HGBUR NEGATIVE  12/20/2016 0241   BILIRUBINUR NEGATIVE 12/20/2016 0241   KETONESUR 5 (A) 12/20/2016 0241   PROTEINUR NEGATIVE 12/20/2016 0241   UROBILINOGEN 1.0 02/14/2015 0058   NITRITE NEGATIVE 12/20/2016 0241   LEUKOCYTESUR NEGATIVE 12/20/2016 0241   Sepsis Labs: @LABRCNTIP (procalcitonin:4,lacticidven:4) )No results found for this or any previous visit (from the past 240 hour(s)).   Radiological Exams on Admission: Dg Pelvis Portable  Result Date: 12/20/2016 CLINICAL DATA:  Fever off and on for 1 week. Found on the floor this morning and unable to get of. EXAM: PORTABLE PELVIS 1-2 VIEWS COMPARISON:  CT abdomen and pelvis 11/07/2015 FINDINGS: Postoperative right hip arthroplasty. Degenerative changes in the lower lumbar spine and left hip. No acute fracture or dislocation of the pelvis. SI joints and symphysis pubis are not displaced. IMPRESSION: No acute bony abnormalities. Degenerative changes in the lower lumbar spine and left hip. Right hip arthroplasty. Electronically Signed   By: Lucienne Capers M.D.   On: 12/20/2016 04:01   Dg Chest Port 1 View  Result Date: 12/20/2016 CLINICAL DATA:  Fever off and on for 1 week.  Fall. EXAM: PORTABLE CHEST 1 VIEW COMPARISON:  11/13/2016 FINDINGS: Shallow inspiration. Heart size and pulmonary vascularity are normal. No focal airspace disease or consolidation in the lungs. No pneumothorax. No blunting of costophrenic angles. Calcified and tortuous aorta. IMPRESSION: Shallow inspiration.  No evidence of active pulmonary  disease. Electronically Signed   By: Lucienne Capers M.D.   On: 12/20/2016 04:02    EKG: Independently reviewed. Normal sinus rhythm and low voltage.  Assessment/Plan Principal Problem:   Ascites Active Problems:   Iron deficiency anemia   Sleep apnea   S/P gastric bypass   Weakness   Liver cirrhosis secondary to NASH ([facility])   CKD (chronic kidney disease) stage 4, GFR 15-29 ml/min ([facility])    1. Ascites probably from decompensated cirrhosis - I have ordered ultrasound-guided paracentesis. Patient will be placed on empiric antibiotics until then. Follow paracentesis labs. 2. Possible GI bleed - since patient has had varices per the EGD done in 2014 for now we will keep patient on octreotide Protonix. Follow CBC. Consult gastroenterologist in a.m. 3. Weakness probably from deconditioning - consult physical therapist. 4. Anemia probably from blood loss - follow CBC. 5. Chronic kidney disease stage III with mild worsening of creatinine - I have ordered urine sodium. Repeat basic metabolic panel has been ordered. If creatinine worsens may have to hold diuretics. 6. Chronic thrombocytopenia probably from cirrhosis - follow CBC.   DVT prophylaxis: SCDs.  Code Status: Full code.  Family Communication: Discussed with patient.  Disposition Plan: Home.  Consults called: None.  Admission status: Observation.    Rise Patience MD [facility] Pager [phone].  If 7PM-7AM, please contact night-coverage [email or web address] Password [email or web address]  12/20/2016, 5:11 AM

## 2016-12-20 NOTE — ED Triage Notes (Addendum)
Pt presents to ED via [facility] with c/o generalize weakness x2 days associated with nausea and vomiting. Pt found on the floor,unable to get up. Pt alerts oriented.

## 2016-12-21 ENCOUNTER — Inpatient Hospital Stay (HOSPITAL_COMMUNITY): Payer: PPO

## 2016-12-21 DIAGNOSIS — N184 Chronic kidney disease, stage 4 (severe): Secondary | ICD-10-CM

## 2016-12-21 DIAGNOSIS — R531 Weakness: Secondary | ICD-10-CM

## 2016-12-21 LAB — COMPREHENSIVE METABOLIC PANEL
ALBUMIN: 2.8 g/dL — AB (ref 3.5–5.0)
ALT: 24 U/L (ref 17–63)
ANION GAP: 7 (ref 5–15)
AST: 41 U/L (ref 15–41)
Alkaline Phosphatase: 118 U/L (ref 38–126)
BILIRUBIN TOTAL: 1.7 mg/dL — AB (ref 0.3–1.2)
BUN: 21 mg/dL — ABNORMAL HIGH (ref 6–20)
CO2: 24 mmol/L (ref 22–32)
Calcium: 7.9 mg/dL — ABNORMAL LOW (ref 8.9–10.3)
Chloride: 105 mmol/L (ref 101–111)
Creatinine, Ser: 1.92 mg/dL — ABNORMAL HIGH (ref 0.61–1.24)
GFR calc Af Amer: 39 mL/min — ABNORMAL LOW (ref >60)
GFR calc non Af Amer: 34 mL/min — ABNORMAL LOW (ref >60)
GLUCOSE: 136 mg/dL — AB (ref 65–99)
Potassium: 3.6 mmol/L (ref 3.5–5.1)
SODIUM: 136 mmol/L (ref 135–145)
TOTAL PROTEIN: 4.7 g/dL — AB (ref 6.5–8.1)

## 2016-12-21 LAB — HEMOGLOBIN A1C
HEMOGLOBIN A1C: 5.2 % (ref 4.8–5.6)
Mean Plasma Glucose: 103 mg/dL

## 2016-12-21 MED ORDER — GADOXETATE DISODIUM 0.25 MMOL/ML IV SOLN
5.0000 mL | Freq: Once | INTRAVENOUS | Status: AC | PRN
  Administered 2016-12-21: 5 mL via INTRAVENOUS

## 2016-12-21 NOTE — Progress Notes (Signed)
Subjective: No complaints.    Objective: Vital signs in last 24 hours: Temp:  [97.9 F (36.6 C)-98.6 F (37 C)] 98.6 F (37 C) (03/17 0610) Pulse Rate:  [64-78] 64 (03/17 0610) Resp:  [18-20] 18 (03/17 0610) BP: (99-145)/(53-77) 99/53 (03/17 0610) SpO2:  [97 %-100 %] 97 % (03/17 0610) Weight:  [94 kg (207 lb 3.7 oz)] 94 kg (207 lb 3.7 oz) (03/17 0610) Last BM Date: 12/19/16  Intake/Output from previous day: 03/16 0701 - 03/17 0700 In: 580 [P.O.:180; IV Piggyback:400] Out: 1050 [Urine:1050] Intake/Output this shift: No intake/output data recorded.  General appearance: alert and no distress GI: soft, distended, + ascites  Lab Results:  Recent Labs  12/20/16 0137 12/20/16 0536  WBC 3.8* 3.0*  HGB 10.7* 9.7*  HCT 30.3* 28.1*  PLT 38* 40*   BMET  Recent Labs  12/20/16 0137 12/20/16 0541 12/21/16 0609  NA 140 141 136  K 3.9 4.0 3.6  CL 108 110 105  CO2 22 24 24   GLUCOSE 184* 166* 136*  BUN 25* 25* 21*  CREATININE 1.89* 1.96* 1.92*  CALCIUM 8.4* 8.5* 7.9*   LFT  Recent Labs  12/21/16 0609  PROT 4.7*  ALBUMIN 2.8*  AST 41  ALT 24  ALKPHOS 118  BILITOT 1.7*   PT/INR  Recent Labs  12/20/16 0153  LABPROT 17.4*  INR 1.41   Hepatitis Panel No results for input(s): HEPBSAG, HCVAB, HEPAIGM, HEPBIGM in the last 72 hours. C-Diff No results for input(s): CDIFFTOX in the last 72 hours. Fecal Lactopherrin No results for input(s): FECLLACTOFRN in the last 72 hours.  Studies/Results: Dg Pelvis Portable  Result Date: 12/20/2016 CLINICAL DATA:  Fever off and on for 1 week. Found on the floor this morning and unable to get of. EXAM: PORTABLE PELVIS 1-2 VIEWS COMPARISON:  CT abdomen and pelvis 11/07/2015 FINDINGS: Postoperative right hip arthroplasty. Degenerative changes in the lower lumbar spine and left hip. No acute fracture or dislocation of the pelvis. SI joints and symphysis pubis are not displaced. IMPRESSION: No acute bony abnormalities. Degenerative  changes in the lower lumbar spine and left hip. Right hip arthroplasty. Electronically Signed   By: Lucienne Capers M.D.   On: 12/20/2016 04:01   US Paracentesis  Result Date: 12/20/2016 INDICATION: Patient with a history of nonalcoholic cirrhosis. Abdominal ascites. Request is made for diagnostic and therapeutic paracentesis. EXAM: ULTRASOUND GUIDED DIAGNOSTIC AND THERAPEUTIC PARACENTESIS MEDICATIONS: 1% lidocaine COMPLICATIONS: None immediate. PROCEDURE: Informed written consent was obtained from the patient after a discussion of the risks, benefits and alternatives to treatment. A timeout was performed prior to the initiation of the procedure. Initial ultrasound scanning demonstrates a mA amount of ascites within the left upper abdominal quadrant. The left upper abdomen was prepped and draped in the usual sterile fashion. 1% lidocaine was used for local anesthesia. Following this, a 19 gauge, 7-cm, Yueh catheter was introduced. An ultrasound image was saved for documentation purposes. The paracentesis was performed. The catheter was removed and a dressing was applied. The patient tolerated the procedure well without immediate post procedural complication. FINDINGS: A total of approximately 10.1 L of clear yellow fluid was removed. Samples were sent to the laboratory as requested by the clinical team. IMPRESSION: Successful ultrasound-guided paracentesis yielding 10.1 liters of peritoneal fluid. Read by: Saverio Danker, PA-C Electronically Signed   By: Jerilynn Mages.  Shick M.D.   On: 12/20/2016 16:21   Dg Chest Port 1 View  Result Date: 12/20/2016 CLINICAL DATA:  Fever off and on for  1 week.  Fall. EXAM: PORTABLE CHEST 1 VIEW COMPARISON:  11/13/2016 FINDINGS: Shallow inspiration. Heart size and pulmonary vascularity are normal. No focal airspace disease or consolidation in the lungs. No pneumothorax. No blunting of costophrenic angles. Calcified and tortuous aorta. IMPRESSION: Shallow inspiration.  No evidence of  active pulmonary disease. Electronically Signed   By: Lucienne Capers M.D.   On: 12/20/2016 04:02    Medications:  Scheduled: . albumin human  50 g Intravenous Q6H  . lactulose  30 g Oral BID  . pantoprazole (PROTONIX) IV  40 mg Intravenous Q24H   Continuous:   Assessment/Plan: 1) NASH cirrhosis. 2) Ascites.   He remains stable at this time.  No complaints.  Creatinine is at 1.9 and baseline ranges from 1.3-1.7 recently.  Diuretics are on hold at this time being that he is s/p 10 liter LVP.  He is receiving albumin 50 g q6 hours.  MRI is pending for the liver lesion.  Plan: 1) Follow weight. 2) Follow creatinine. 3) Hold diuretics for now. 4) Continue with the 2 gram sodium diet. 5) Await MRI results.    LOS: 1 day   Ramla Hase D 12/21/2016, 11:50 AM

## 2016-12-21 NOTE — Progress Notes (Signed)
PROGRESS NOTE                                                                                                                                                                                                             Patient Demographics:    [name] House, is a 71 y.o. male, DOB - [birth year], RDE:[id number]  Admit date - 12/20/2016   Admitting Physician Rise Patience, MD  Outpatient Primary MD for the patient is [name], MD  LOS - 1  Outpatient Specialists: GI Dr Henrene Pastor  Chief Complaint  Patient presents with  . Weakness       Brief Narrative   71 y.o. male with history of nonalcoholic liver cirrhosis, chronic anemia and thrombocytopenia with history of esophageal varices , Presents with weakness, workup significant for ascitis, that is post paracentesis with 10 L drained, no SBP.   Subjective:    [name] House today has, No headache, No chest pain, No abdominal pain - No Nausea, No Cough - SOB.    Assessment  & Plan :    Principal Problem:   Ascites Active Problems:   Iron deficiency anemia   Sleep apnea   S/P gastric bypass   Weakness   Liver cirrhosis secondary to NASH ([facility])   CKD (chronic kidney disease) stage 4, GFR 15-29 ml/min ([facility])   Liver lesion  Liver cirrhosis secondary to [name] - Patient presents with worsening lower extremity/scrotal edema, most likely related to decompensated liver failure. - Initially on empiric SBP antibiotic coverage, status post paracentesis 3/16 with similar liters drained, no evidence of SBP, Rocephin stopped - Elevated ammonia level, no encephalopathy, continue with lactulose, Xifaxan if needed, no neomycin given renal failure. - Diuresis has been stopped by GI in the setting of worsening renal function, as well large volume paracentesis which resulted in significant improvement of edema  Hemoccult-positive stool - No evidence of significant bleeding, hemoglobin at baseline, patient with  known history of varices, but no evidence of significant GI bleed, will treat and has been stopped.  AKI on CK D stage III - Continueto monitor closely , Lasix and Aldactone on hold  Chronic thrombocytopenia - Secondary to liver cirrhosis  Weakness and debilitation - Consult PT  Abnormal CT finding in January 2017 showing small liver lesions - MRI liver without with contrast is pending  Code Status : Full  Family Communication  : none at bedside  Disposition Plan  : pending further workup  Consults  :  GI  Procedures  : Paracentesis 3/16 with 10 L drained  DVT Prophylaxis  :   SCDs   Lab Results  Component Value Date   PLT 40 (L) 12/20/2016    Antibiotics  :    Anti-infectives    Start     Dose/Rate Route Frequency Ordered Stop   12/20/16 1000  neomycin (MYCIFRADIN) tablet 1,000 mg  Status:  Discontinued     1,000 mg Oral 2 times daily 12/20/16 0511 12/20/16 1713   12/20/16 0600  cefTRIAXone (ROCEPHIN) 2 g in dextrose 5 % 50 mL IVPB  Status:  Discontinued     2 g 100 mL/hr over 30 Minutes Intravenous Every 24 hours 12/20/16 0524 12/20/16 1752        Objective:   Vitals:   12/20/16 1600 12/20/16 1657 12/20/16 2156 12/21/16 0610  BP: 135/73 128/74 (!) 145/68 (!) 99/53  Pulse:  78 72 64  Resp:  20 19 18   Temp:  98.2 F (36.8 C) 98 F (36.7 C) 98.6 F (37 C)  TempSrc:  Oral Oral Oral  SpO2:  100% 100% 97%  Weight:    94 kg (207 lb 3.7 oz)  Height:        Wt Readings from Last 3 Encounters:  12/21/16 94 kg (207 lb 3.7 oz)  11/13/16 91.9 kg (202 lb 9.6 oz)  06/27/16 84.1 kg (185 lb 6 oz)     Intake/Output Summary (Last 24 hours) at 12/21/16 1116 Last data filed at 12/21/16 0610  Gross per 24 hour  Intake              580 ml  Output              800 ml  Net             -220 ml     Physical Exam  Awake Alert, Oriented X 3,Frail, chronically ill-appearing Supple Neck,No JVD,  Symmetrical Chest wall movement, Good air movement bilaterally,  CTAB RRR,No Gallops,Rubs or new Murmurs, No Parasternal Heave +ve B.Sounds, Abd Soft, Ascites significantly sided, scrotal swelling resolved, No tenderness, No Cyanosis, Clubbing , +1 edema    Data Review:    CBC  Recent Labs Lab 12/20/16 0137 12/20/16 0536  WBC 3.8* 3.0*  HGB 10.7* 9.7*  HCT 30.3* 28.1*  PLT 38* 40*  MCV 88.3 86.7  MCH 31.2 29.9  MCHC 35.3 34.5  RDW 14.5 14.4  LYMPHSABS 0.2*  --   MONOABS 0.2  --   EOSABS 0.0  --   BASOSABS 0.0  --     Chemistries   Recent Labs Lab 12/20/16 0137 12/20/16 0541 12/21/16 0609  NA 140 141 136  K 3.9 4.0 3.6  CL 108 110 105  CO2 22 24 24   GLUCOSE 184* 166* 136*  BUN 25* 25* 21*  CREATININE 1.89* 1.96* 1.92*  CALCIUM 8.4* 8.5* 7.9*  AST 47*  --  41  ALT 27  --  24  ALKPHOS 163*  --  118  BILITOT 2.4*  --  1.7*   ------------------------------------------------------------------------------------------------------------------ No results for input(s): CHOL, HDL, LDLCALC, TRIG, CHOLHDL, LDLDIRECT in the last 72 hours.  Lab Results  Component Value Date   HGBA1C 5.2 12/20/2016   ------------------------------------------------------------------------------------------------------------------ No results for input(s): TSH, T4TOTAL, T3FREE, THYROIDAB in the last 72 hours.  Invalid input(s): FREET3 ------------------------------------------------------------------------------------------------------------------  No results for input(s): VITAMINB12, FOLATE, FERRITIN, TIBC, IRON, RETICCTPCT in the last 72 hours.  Coagulation profile  Recent Labs Lab 12/20/16 0153  INR 1.41    No results for input(s): DDIMER in the last 72 hours.  Cardiac Enzymes  Recent Labs Lab 12/20/16 0541  TROPONINI 0.04*   ------------------------------------------------------------------------------------------------------------------ No results found for: BNP  Inpatient Medications  Scheduled Meds: . albumin human  50 g  Intravenous Q6H  . lactulose  30 g Oral BID  . pantoprazole (PROTONIX) IV  40 mg Intravenous Q24H   Continuous Infusions:  PRN Meds:.acetaminophen **OR** acetaminophen, ondansetron **OR** ondansetron (ZOFRAN) IV  Micro Results Recent Results (from the past 240 hour(s))  Gram stain     Status: None   Collection Time: 12/20/16  4:31 PM  Result Value Ref Range Status   Specimen Description FLUID PERITONEAL  Final   Special Requests NONE  Final   Gram Stain   Final    RARE WBC PRESENT, PREDOMINANTLY MONONUCLEAR NO ORGANISMS SEEN Performed at [facility], [address] [place], [place] [address]    Report Status 12/20/2016 FINAL  Final    Radiology Reports Dg Pelvis Portable  Result Date: 12/20/2016 CLINICAL DATA:  Fever off and on for 1 week. Found on the floor this morning and unable to get of. EXAM: PORTABLE PELVIS 1-2 VIEWS COMPARISON:  CT abdomen and pelvis 11/07/2015 FINDINGS: Postoperative right hip arthroplasty. Degenerative changes in the lower lumbar spine and left hip. No acute fracture or dislocation of the pelvis. SI joints and symphysis pubis are not displaced. IMPRESSION: No acute bony abnormalities. Degenerative changes in the lower lumbar spine and left hip. Right hip arthroplasty. Electronically Signed   By: Lucienne Capers M.D.   On: 12/20/2016 04:01   US Paracentesis  Result Date: 12/20/2016 INDICATION: Patient with a history of nonalcoholic cirrhosis. Abdominal ascites. Request is made for diagnostic and therapeutic paracentesis. EXAM: ULTRASOUND GUIDED DIAGNOSTIC AND THERAPEUTIC PARACENTESIS MEDICATIONS: 1% lidocaine COMPLICATIONS: None immediate. PROCEDURE: Informed written consent was obtained from the patient after a discussion of the risks, benefits and alternatives to treatment. A timeout was performed prior to the initiation of the procedure. Initial ultrasound scanning demonstrates a mA amount of ascites within the left upper abdominal quadrant. The  left upper abdomen was prepped and draped in the usual sterile fashion. 1% lidocaine was used for local anesthesia. Following this, a 19 gauge, 7-cm, Yueh catheter was introduced. An ultrasound image was saved for documentation purposes. The paracentesis was performed. The catheter was removed and a dressing was applied. The patient tolerated the procedure well without immediate post procedural complication. FINDINGS: A total of approximately 10.1 L of clear yellow fluid was removed. Samples were sent to the laboratory as requested by the clinical team. IMPRESSION: Successful ultrasound-guided paracentesis yielding 10.1 liters of peritoneal fluid. Read by: Saverio Danker, PA-C Electronically Signed   By: Jerilynn Mages.  Shick M.D.   On: 12/20/2016 16:21   Dg Chest Port 1 View  Result Date: 12/20/2016 CLINICAL DATA:  Fever off and on for 1 week.  Fall. EXAM: PORTABLE CHEST 1 VIEW COMPARISON:  11/13/2016 FINDINGS: Shallow inspiration. Heart size and pulmonary vascularity are normal. No focal airspace disease or consolidation in the lungs. No pneumothorax. No blunting of costophrenic angles. Calcified and tortuous aorta. IMPRESSION: Shallow inspiration.  No evidence of active pulmonary disease. Electronically Signed   By: Lucienne Capers M.D.   On: 12/20/2016 04:02     Waldron Labs, DAWOOD M.D on 12/21/2016 at  11:16 AM  Between 7am to 7pm - Pager - [phone]  After 7pm go to [email or web address] - password [facility]  [facility] -  Office  [phone]

## 2016-12-22 DIAGNOSIS — D509 Iron deficiency anemia, unspecified: Secondary | ICD-10-CM

## 2016-12-22 LAB — BASIC METABOLIC PANEL
ANION GAP: 5 (ref 5–15)
BUN: 19 mg/dL (ref 6–20)
CO2: 23 mmol/L (ref 22–32)
Calcium: 8 mg/dL — ABNORMAL LOW (ref 8.9–10.3)
Chloride: 111 mmol/L (ref 101–111)
Creatinine, Ser: 1.67 mg/dL — ABNORMAL HIGH (ref 0.61–1.24)
GFR calc Af Amer: 47 mL/min — ABNORMAL LOW (ref >60)
GFR, EST NON AFRICAN AMERICAN: 40 mL/min — AB (ref >60)
GLUCOSE: 117 mg/dL — AB (ref 65–99)
POTASSIUM: 3.4 mmol/L — AB (ref 3.5–5.1)
SODIUM: 139 mmol/L (ref 135–145)

## 2016-12-22 LAB — CBC
HCT: 23.9 % — ABNORMAL LOW (ref 39.0–52.0)
Hemoglobin: 8.3 g/dL — ABNORMAL LOW (ref 13.0–17.0)
MCH: 30.6 pg (ref 26.0–34.0)
MCHC: 34.7 g/dL (ref 30.0–36.0)
MCV: 88.2 fL (ref 78.0–100.0)
PLATELETS: 31 10*3/uL — AB (ref 150–400)
RBC: 2.71 MIL/uL — AB (ref 4.22–5.81)
RDW: 14.2 % (ref 11.5–15.5)
WBC: 1.5 10*3/uL — ABNORMAL LOW (ref 4.0–10.5)

## 2016-12-22 MED ORDER — ZOLPIDEM TARTRATE 5 MG PO TABS
5.0000 mg | ORAL_TABLET | Freq: Once | ORAL | Status: AC
  Administered 2016-12-22: 5 mg via ORAL
  Filled 2016-12-22: qty 1

## 2016-12-22 MED ORDER — GERHARDT'S BUTT CREAM
TOPICAL_CREAM | Freq: Four times a day (QID) | CUTANEOUS | Status: DC | PRN
  Administered 2016-12-23: 1 via TOPICAL
  Filled 2016-12-22: qty 1

## 2016-12-22 NOTE — Progress Notes (Signed)
PROGRESS NOTE                                                                                                                                                                                                             Patient Demographics:    [name] House, is a 71 y.o. male, DOB - 08/24/1946, BWI:[id number]  Admit date - 12/20/2016   Admitting Physician Rise Patience, MD  Outpatient Primary MD for the patient is [name] Lopes, MD  LOS - 2  Outpatient Specialists: GI Dr Henrene Pastor  Chief Complaint  Patient presents with  . Weakness       Brief Narrative   71 y.o. male with history of nonalcoholic liver cirrhosis, chronic anemia and thrombocytopenia with history of esophageal varices , Presents with weakness, workup significant for ascitis, that is post paracentesis with 10 L drained, no SBP.   Subjective:    [name] House today has, No headache, No chest pain, No abdominal pain - No Nausea, No Cough - SOB.    Assessment  & Plan :    Principal Problem:   Ascites Active Problems:   Iron deficiency anemia   Sleep apnea   S/P gastric bypass   Weakness   Liver cirrhosis secondary to NASH (HCC)   CKD (chronic kidney disease) stage 4, GFR 15-29 ml/min (HCC)   Liver lesion  Liver cirrhosis secondary to [name] House - Patient presents with worsening lower extremity/scrotal edema, most likely related to decompensated liver failure. - Initially on empiric SBP antibiotic coverage, status post paracentesis 3/16 with similar liters drained, no evidence of SBP, Rocephin stopped - Elevated ammonia level, no encephalopathy, continue with lactulose, Xifaxan if needed, no neomycin given renal failure. - Renal function is improving, resumed on Sporanox 100 mg oral daily, continue to hold Lasix, follow creatinine - Seems to be reaccumulating, gained 2 kg over the last 24 hours, possible refractory ascites, may need TIPS, GI following.  Hemoccult-positive stool -  No evidence of significant bleeding, hemoglobin at baseline, patient with known history of varices, but no evidence of significant GI bleed, will treat and has been stopped.  AKI on CK D stage III - Continueto monitor closely , Lasix on hold, creatinine is improving, so resumed on Aldactone today  Chronic thrombocytopenia - Secondary to liver cirrhosis  Weakness and debilitation - PT consulted,  Abnormal  CT finding in January 2017 showing small liver lesions - MRI liver negative for any evidence of HCC  Anemia - Secondary to chronic illness and liver failure, Hemoccult-positive stool but no evidence of significant bleeding, continue to monitor him a glycohemoglobin and transfuse as needed   Code Status : Full  Family Communication  : none at bedside  Disposition Plan  : Followed  by PT,may need SNF  Consults  :  GI  Procedures  : Paracentesis 3/16 with 10 L drained  DVT Prophylaxis  :   SCDs , no chemical prophylaxis giving thrombocytopenia  Lab Results  Component Value Date   PLT 31 (L) 12/22/2016    Antibiotics  :    Anti-infectives    Start     Dose/Rate Route Frequency Ordered Stop   12/20/16 1000  neomycin (MYCIFRADIN) tablet 1,000 mg  Status:  Discontinued     1,000 mg Oral 2 times daily 12/20/16 0511 12/20/16 1713   12/20/16 0600  cefTRIAXone (ROCEPHIN) 2 g in dextrose 5 % 50 mL IVPB  Status:  Discontinued     2 g 100 mL/hr over 30 Minutes Intravenous Every 24 hours 12/20/16 0524 12/20/16 1752        Objective:   Vitals:   12/21/16 1500 12/21/16 2100 12/22/16 0515 12/22/16 0518  BP: 120/65 (!) 120/53 (!) 108/52   Pulse: 70 70 66   Resp:  18 18   Temp: 97.5 F (36.4 C) 98.1 F (36.7 C) 99 F (37.2 C)   TempSrc: Oral Oral Oral   SpO2: 99% 100% 95%   Weight:    96.2 kg (212 lb 1.3 oz)  Height:        Wt Readings from Last 3 Encounters:  12/22/16 96.2 kg (212 lb 1.3 oz)  11/13/16 91.9 kg (202 lb 9.6 oz)  06/27/16 84.1 kg (185 lb 6 oz)      Intake/Output Summary (Last 24 hours) at 12/22/16 1137 Last data filed at 12/22/16 0800  Gross per 24 hour  Intake             1500 ml  Output              800 ml  Net              700 ml     Physical Exam  Awake Alert, Oriented X 3,Frail, chronically ill-appearing Supple Neck,No JVD,  Symmetrical Chest wall movement, Good air movement bilaterally, CTAB RRR,No Gallops,Rubs or new Murmurs, No Parasternal Heave +ve B.Sounds, Abd Soft, Ascites significantly sided, scrotal swelling resolved, No tenderness, No Cyanosis, Clubbing , +1 edema    Data Review:    CBC  Recent Labs Lab 12/20/16 0137 12/20/16 0536 12/22/16 0531  WBC 3.8* 3.0* 1.5*  HGB 10.7* 9.7* 8.3*  HCT 30.3* 28.1* 23.9*  PLT 38* 40* 31*  MCV 88.3 86.7 88.2  MCH 31.2 29.9 30.6  MCHC 35.3 34.5 34.7  RDW 14.5 14.4 14.2  LYMPHSABS 0.2*  --   --   MONOABS 0.2  --   --   EOSABS 0.0  --   --   BASOSABS 0.0  --   --     Chemistries   Recent Labs Lab 12/20/16 0137 12/20/16 0541 12/21/16 0609 12/22/16 0531  NA 140 141 136 139  K 3.9 4.0 3.6 3.4*  CL 108 110 105 111  CO2 22 24 24 23   GLUCOSE 184* 166* 136* 117*  BUN 25* 25* 21* 19  CREATININE 1.89*  1.96* 1.92* 1.67*  CALCIUM 8.4* 8.5* 7.9* 8.0*  AST 47*  --  41  --   ALT 27  --  24  --   ALKPHOS 163*  --  118  --   BILITOT 2.4*  --  1.7*  --    ------------------------------------------------------------------------------------------------------------------ No results for input(s): CHOL, HDL, LDLCALC, TRIG, CHOLHDL, LDLDIRECT in the last 72 hours.  Lab Results  Component Value Date   HGBA1C 5.2 12/20/2016   ------------------------------------------------------------------------------------------------------------------ No results for input(s): TSH, T4TOTAL, T3FREE, THYROIDAB in the last 72 hours.  Invalid input(s):  FREET3 ------------------------------------------------------------------------------------------------------------------ No results for input(s): VITAMINB12, FOLATE, FERRITIN, TIBC, IRON, RETICCTPCT in the last 72 hours.  Coagulation profile  Recent Labs Lab 12/20/16 0153  INR 1.41    No results for input(s): DDIMER in the last 72 hours.  Cardiac Enzymes  Recent Labs Lab 12/20/16 0541  TROPONINI 0.04*   ------------------------------------------------------------------------------------------------------------------ No results found for: BNP  Inpatient Medications  Scheduled Meds: . lactulose  30 g Oral BID  . pantoprazole (PROTONIX) IV  40 mg Intravenous Q24H   Continuous Infusions:  PRN Meds:.ondansetron **OR** ondansetron (ZOFRAN) IV  Micro Results Recent Results (from the past 240 hour(s))  Culture, body fluid-bottle     Status: None (Preliminary result)   Collection Time: 12/20/16  4:31 PM  Result Value Ref Range Status   Specimen Description FLUID PERITONEAL  Final   Special Requests BOTTLES DRAWN AEROBIC AND ANAEROBIC 10CC  Final   Culture   Final    NO GROWTH < 24 HOURS Performed at [facility], [place] [address] [place], [place] [address]    Report Status PENDING  Incomplete  Gram stain     Status: None   Collection Time: 12/20/16  4:31 PM  Result Value Ref Range Status   Specimen Description FLUID PERITONEAL  Final   Special Requests NONE  Final   Gram Stain   Final    RARE WBC PRESENT, PREDOMINANTLY MONONUCLEAR NO ORGANISMS SEEN Performed at [facility], [place] [address] [place], [place] [address]    Report Status 12/20/2016 FINAL  Final    Radiology Reports Mr Liver W Wo Contrast  Result Date: 12/21/2016 CLINICAL DATA:  71 year old male with history of NASH cirrhosis. Abnormal CT scan 11/07/2015 demonstrating a hypervascular liver lesion. Followup study. EXAM: MRI ABDOMEN WITHOUT AND WITH CONTRAST TECHNIQUE: Multiplanar  multisequence MR imaging of the abdomen was performed both before and after the administration of intravenous contrast. CONTRAST:  5 mL of Eovist. COMPARISON:  No prior abdominal MRI. CT the abdomen and pelvis 11/07/2015. FINDINGS: Lower chest: Trace bilateral pleural effusions lying dependently. Massive distal esophageal varices. Hepatobiliary: The liver has a shrunken appearance and nodular contour, compatible with advanced cirrhosis. In segment 2 of the liver there is a 1.3 cm T1 hypointense, T2 hyperintense, nonenhancing lesion, compatible with a simple cyst. Similar appearing exophytic lesion extending off the posterior aspect of the right lobe of the liver between segments 6 and 7 is also compatible with a cyst. No other aggressive appearing hepatic lesions are noted. Specifically, no finding to suggest underlying hepatocellular carcinoma. No intra or extrahepatic biliary ductal dilatation. Status post cholecystectomy. Pancreas: No pancreatic mass. No pancreatic ductal dilatation. Pancreatic atrophy. Spleen: Spleen is enlarged measuring 16.6 x 8.7 x 22.9 cm (estimated splenic volume of 1,654 mL). Adrenals/Urinary Tract: Kidneys are incompletely visualized on all pulse sequences and poorly evaluated on today's study. The right kidney demonstrates several T1 hypointense, T2 hyperintense, nonenhancing lesions  measuring up to 7.6 cm in the upper pole, compatible with simple cysts. Bilateral adrenal glands are normal in appearance. Stomach/Bowel: Visualized portions are unremarkable. Vascular/Lymphatic: No aneurysm identified in the abdominal arterial vasculature. Portal vein is markedly dilated measuring up to 26 mm in diameter in the porta hepatis. Numerous portosystemic collateral pathways are noted, including dilated recannulated paraumbilical vein and numerous massively enlarged distal esophageal varices. Other:  Large volume of ascites. Musculoskeletal: No aggressive appearing osseous lesions are noted in the  visualized portions of the skeleton. IMPRESSION: 1. Stigmata of advanced cirrhosis with severe portal hypertension as demonstrated by dilated portal vein, severe splenomegaly and numerous large portosystemic collateral pathways, most notable for massive distal esophageal varices. 2. No hypervascular hepatic lesion noted to suggest hepatocellular carcinoma at this time. 3. Large volume of ascites. 4. Trace bilateral pleural effusions lying dependently. 5. Additional incidental findings, as above. Electronically Signed   By: Vinnie Langton M.D.   On: 12/21/2016 17:34   Dg Pelvis Portable  Result Date: 12/20/2016 CLINICAL DATA:  Fever off and on for 1 week. Found on the floor this morning and unable to get of. EXAM: PORTABLE PELVIS 1-2 VIEWS COMPARISON:  CT abdomen and pelvis 11/07/2015 FINDINGS: Postoperative right hip arthroplasty. Degenerative changes in the lower lumbar spine and left hip. No acute fracture or dislocation of the pelvis. SI joints and symphysis pubis are not displaced. IMPRESSION: No acute bony abnormalities. Degenerative changes in the lower lumbar spine and left hip. Right hip arthroplasty. Electronically Signed   By: Lucienne Capers M.D.   On: 12/20/2016 04:01   US Paracentesis  Result Date: 12/20/2016 INDICATION: Patient with a history of nonalcoholic cirrhosis. Abdominal ascites. Request is made for diagnostic and therapeutic paracentesis. EXAM: ULTRASOUND GUIDED DIAGNOSTIC AND THERAPEUTIC PARACENTESIS MEDICATIONS: 1% lidocaine COMPLICATIONS: None immediate. PROCEDURE: Informed written consent was obtained from the patient after a discussion of the risks, benefits and alternatives to treatment. A timeout was performed prior to the initiation of the procedure. Initial ultrasound scanning demonstrates a mA amount of ascites within the left upper abdominal quadrant. The left upper abdomen was prepped and draped in the usual sterile fashion. 1% lidocaine was used for local anesthesia.  Following this, a 19 gauge, 7-cm, Yueh catheter was introduced. An ultrasound image was saved for documentation purposes. The paracentesis was performed. The catheter was removed and a dressing was applied. The patient tolerated the procedure well without immediate post procedural complication. FINDINGS: A total of approximately 10.1 L of clear yellow fluid was removed. Samples were sent to the laboratory as requested by the clinical team. IMPRESSION: Successful ultrasound-guided paracentesis yielding 10.1 liters of peritoneal fluid. Read by: Saverio Danker, PA-C Electronically Signed   By: Jerilynn Mages.  Shick M.D.   On: 12/20/2016 16:21   Dg Chest Port 1 View  Result Date: 12/20/2016 CLINICAL DATA:  Fever off and on for 1 week.  Fall. EXAM: PORTABLE CHEST 1 VIEW COMPARISON:  11/13/2016 FINDINGS: Shallow inspiration. Heart size and pulmonary vascularity are normal. No focal airspace disease or consolidation in the lungs. No pneumothorax. No blunting of costophrenic angles. Calcified and tortuous aorta. IMPRESSION: Shallow inspiration.  No evidence of active pulmonary disease. Electronically Signed   By: Lucienne Capers M.D.   On: 12/20/2016 04:02     [facility], Fayetta Sorenson M.D on 12/22/2016 at 11:37 AM  Between 7am to 7pm - Pager - [phone]  After 7pm go to [email or web address] - password [facility]  [facility] -  Office  [phone]

## 2016-12-22 NOTE — Progress Notes (Signed)
Subjective: No complaints.  Objective: Vital signs in last 24 hours: Temp:  [97.5 F (36.4 C)-99 F (37.2 C)] 99 F (37.2 C) (03/18 0515) Pulse Rate:  [66-70] 66 (03/18 0515) Resp:  [18] 18 (03/18 0515) BP: (108-120)/(52-65) 108/52 (03/18 0515) SpO2:  [95 %-100 %] 95 % (03/18 0515) Weight:  [96.2 kg (212 lb 1.3 oz)] 96.2 kg (212 lb 1.3 oz) (03/18 0518) Last BM Date: 12/21/16  Intake/Output from previous day: 03/17 0701 - 03/18 0700 In: 780 [P.O.:580; IV Piggyback:200] Out: 800 [Urine:800] Intake/Output this shift: Total I/O In: 720 [P.O.:720] Out: -   General appearance: alert and no distress GI: large ascites, soft, nontender  Lab Results:  Recent Labs  12/20/16 0137 12/20/16 0536 12/22/16 0531  WBC 3.8* 3.0* 1.5*  HGB 10.7* 9.7* 8.3*  HCT 30.3* 28.1* 23.9*  PLT 38* 40* 31*   BMET  Recent Labs  12/20/16 0541 12/21/16 0609 12/22/16 0531  NA 141 136 139  K 4.0 3.6 3.4*  CL 110 105 111  CO2 24 24 23   GLUCOSE 166* 136* 117*  BUN 25* 21* 19  CREATININE 1.96* 1.92* 1.67*  CALCIUM 8.5* 7.9* 8.0*   LFT  Recent Labs  12/21/16 0609  PROT 4.7*  ALBUMIN 2.8*  AST 41  ALT 24  ALKPHOS 118  BILITOT 1.7*   PT/INR  Recent Labs  12/20/16 0153  LABPROT 17.4*  INR 1.41   Hepatitis Panel No results for input(s): HEPBSAG, HCVAB, HEPAIGM, HEPBIGM in the last 72 hours. C-Diff No results for input(s): CDIFFTOX in the last 72 hours. Fecal Lactopherrin No results for input(s): FECLLACTOFRN in the last 72 hours.  Studies/Results: Mr Liver W Wo Contrast  Result Date: 12/21/2016 CLINICAL DATA:  71 year old male with history of NASH cirrhosis. Abnormal CT scan 11/07/2015 demonstrating a hypervascular liver lesion. Followup study. EXAM: MRI ABDOMEN WITHOUT AND WITH CONTRAST TECHNIQUE: Multiplanar multisequence MR imaging of the abdomen was performed both before and after the administration of intravenous contrast. CONTRAST:  5 mL of Eovist. COMPARISON:  No prior  abdominal MRI. CT the abdomen and pelvis 11/07/2015. FINDINGS: Lower chest: Trace bilateral pleural effusions lying dependently. Massive distal esophageal varices. Hepatobiliary: The liver has a shrunken appearance and nodular contour, compatible with advanced cirrhosis. In segment 2 of the liver there is a 1.3 cm T1 hypointense, T2 hyperintense, nonenhancing lesion, compatible with a simple cyst. Similar appearing exophytic lesion extending off the posterior aspect of the right lobe of the liver between segments 6 and 7 is also compatible with a cyst. No other aggressive appearing hepatic lesions are noted. Specifically, no finding to suggest underlying hepatocellular carcinoma. No intra or extrahepatic biliary ductal dilatation. Status post cholecystectomy. Pancreas: No pancreatic mass. No pancreatic ductal dilatation. Pancreatic atrophy. Spleen: Spleen is enlarged measuring 16.6 x 8.7 x 22.9 cm (estimated splenic volume of 1,654 mL). Adrenals/Urinary Tract: Kidneys are incompletely visualized on all pulse sequences and poorly evaluated on today's study. The right kidney demonstrates several T1 hypointense, T2 hyperintense, nonenhancing lesions measuring up to 7.6 cm in the upper pole, compatible with simple cysts. Bilateral adrenal glands are normal in appearance. Stomach/Bowel: Visualized portions are unremarkable. Vascular/Lymphatic: No aneurysm identified in the abdominal arterial vasculature. Portal vein is markedly dilated measuring up to 26 mm in diameter in the porta hepatis. Numerous portosystemic collateral pathways are noted, including dilated recannulated paraumbilical vein and numerous massively enlarged distal esophageal varices. Other:  Large volume of ascites. Musculoskeletal: No aggressive appearing osseous lesions are noted in the visualized  portions of the skeleton. IMPRESSION: 1. Stigmata of advanced cirrhosis with severe portal hypertension as demonstrated by dilated portal vein, severe  splenomegaly and numerous large portosystemic collateral pathways, most notable for massive distal esophageal varices. 2. No hypervascular hepatic lesion noted to suggest hepatocellular carcinoma at this time. 3. Large volume of ascites. 4. Trace bilateral pleural effusions lying dependently. 5. Additional incidental findings, as above. Electronically Signed   By: Vinnie Langton M.D.   On: 12/21/2016 17:34   US Paracentesis  Result Date: 12/20/2016 INDICATION: Patient with a history of nonalcoholic cirrhosis. Abdominal ascites. Request is made for diagnostic and therapeutic paracentesis. EXAM: ULTRASOUND GUIDED DIAGNOSTIC AND THERAPEUTIC PARACENTESIS MEDICATIONS: 1% lidocaine COMPLICATIONS: None immediate. PROCEDURE: Informed written consent was obtained from the patient after a discussion of the risks, benefits and alternatives to treatment. A timeout was performed prior to the initiation of the procedure. Initial ultrasound scanning demonstrates a mA amount of ascites within the left upper abdominal quadrant. The left upper abdomen was prepped and draped in the usual sterile fashion. 1% lidocaine was used for local anesthesia. Following this, a 19 gauge, 7-cm, Yueh catheter was introduced. An ultrasound image was saved for documentation purposes. The paracentesis was performed. The catheter was removed and a dressing was applied. The patient tolerated the procedure well without immediate post procedural complication. FINDINGS: A total of approximately 10.1 L of clear yellow fluid was removed. Samples were sent to the laboratory as requested by the clinical team. IMPRESSION: Successful ultrasound-guided paracentesis yielding 10.1 liters of peritoneal fluid. Read by: Saverio Danker, PA-C Electronically Signed   By: Jerilynn Mages.  Shick M.D.   On: 12/20/2016 16:21    Medications:  Scheduled: . lactulose  30 g Oral BID  . pantoprazole (PROTONIX) IV  40 mg Intravenous Q24H   Continuous:   Assessment/Plan: 1) NASH  cirrhosis. 2) Ascites. 3) AKI.   The MRI was negative for any evidence of HCC.  His creatinine has declined, but over the past 24 hours he has gained 2 kg.  At this time it is difficult to discern if the patient has refractory ascites mandating TIPS.  With the improvement of his creatinine, he can be restarted on spironolactone first.    Plan: 1) Restart Spironolactone 100 mg QD. 2) Follow creatinine. 3) Daily weights. 4) [place] to resume care in AM.  LOS: 2 days   Braidyn Scorsone D 12/22/2016, 9:33 AM

## 2016-12-23 DIAGNOSIS — R601 Generalized edema: Secondary | ICD-10-CM

## 2016-12-23 DIAGNOSIS — R188 Other ascites: Secondary | ICD-10-CM

## 2016-12-23 DIAGNOSIS — K7290 Hepatic failure, unspecified without coma: Secondary | ICD-10-CM

## 2016-12-23 DIAGNOSIS — K7460 Unspecified cirrhosis of liver: Secondary | ICD-10-CM

## 2016-12-23 DIAGNOSIS — K7682 Hepatic encephalopathy: Secondary | ICD-10-CM

## 2016-12-23 DIAGNOSIS — N289 Disorder of kidney and ureter, unspecified: Secondary | ICD-10-CM

## 2016-12-23 DIAGNOSIS — K7581 Nonalcoholic steatohepatitis (NASH): Principal | ICD-10-CM

## 2016-12-23 LAB — CBC WITH DIFFERENTIAL/PLATELET
BASOS PCT: 0 %
Basophils Absolute: 0 10*3/uL (ref 0.0–0.1)
Eosinophils Absolute: 0.1 10*3/uL (ref 0.0–0.7)
Eosinophils Relative: 6 %
HEMATOCRIT: 24.9 % — AB (ref 39.0–52.0)
Hemoglobin: 8.8 g/dL — ABNORMAL LOW (ref 13.0–17.0)
LYMPHS ABS: 0.3 10*3/uL — AB (ref 0.7–4.0)
LYMPHS PCT: 14 %
MCH: 30 pg (ref 26.0–34.0)
MCHC: 35.3 g/dL (ref 30.0–36.0)
MCV: 85 fL (ref 78.0–100.0)
MONOS PCT: 7 %
Monocytes Absolute: 0.1 10*3/uL (ref 0.1–1.0)
Neutro Abs: 1.4 10*3/uL — ABNORMAL LOW (ref 1.7–7.7)
Neutrophils Relative %: 73 %
Platelets: 31 10*3/uL — ABNORMAL LOW (ref 150–400)
RBC: 2.93 MIL/uL — ABNORMAL LOW (ref 4.22–5.81)
RDW: 13.9 % (ref 11.5–15.5)
WBC: 2 10*3/uL — AB (ref 4.0–10.5)

## 2016-12-23 LAB — COMPREHENSIVE METABOLIC PANEL
ALBUMIN: 3.1 g/dL — AB (ref 3.5–5.0)
ALK PHOS: 96 U/L (ref 38–126)
ALT: 20 U/L (ref 17–63)
AST: 31 U/L (ref 15–41)
Anion gap: 5 (ref 5–15)
BILIRUBIN TOTAL: 1.5 mg/dL — AB (ref 0.3–1.2)
BUN: 20 mg/dL (ref 6–20)
CALCIUM: 7.9 mg/dL — AB (ref 8.9–10.3)
CO2: 21 mmol/L — ABNORMAL LOW (ref 22–32)
CREATININE: 1.41 mg/dL — AB (ref 0.61–1.24)
Chloride: 110 mmol/L (ref 101–111)
GFR calc Af Amer: 57 mL/min — ABNORMAL LOW (ref >60)
GFR, EST NON AFRICAN AMERICAN: 49 mL/min — AB (ref >60)
GLUCOSE: 101 mg/dL — AB (ref 65–99)
Potassium: 3.8 mmol/L (ref 3.5–5.1)
Sodium: 136 mmol/L (ref 135–145)
TOTAL PROTEIN: 4.9 g/dL — AB (ref 6.5–8.1)

## 2016-12-23 LAB — GLUCOSE, CAPILLARY: Glucose-Capillary: 96 mg/dL (ref 65–99)

## 2016-12-23 MED ORDER — FUROSEMIDE 20 MG PO TABS
20.0000 mg | ORAL_TABLET | Freq: Every day | ORAL | Status: DC
  Administered 2016-12-23 – 2016-12-24 (×2): 20 mg via ORAL
  Filled 2016-12-23 (×2): qty 1

## 2016-12-23 MED ORDER — SPIRONOLACTONE 25 MG PO TABS
75.0000 mg | ORAL_TABLET | Freq: Once | ORAL | Status: AC
  Administered 2016-12-23: 75 mg via ORAL
  Filled 2016-12-23: qty 3

## 2016-12-23 MED ORDER — SPIRONOLACTONE 25 MG PO TABS
25.0000 mg | ORAL_TABLET | Freq: Every day | ORAL | Status: DC
  Administered 2016-12-23: 25 mg via ORAL
  Filled 2016-12-23: qty 1

## 2016-12-23 MED ORDER — PANTOPRAZOLE SODIUM 40 MG PO TBEC
40.0000 mg | DELAYED_RELEASE_TABLET | Freq: Every day | ORAL | Status: DC
  Administered 2016-12-23 – 2016-12-24 (×2): 40 mg via ORAL
  Filled 2016-12-23 (×2): qty 1

## 2016-12-23 MED ORDER — SPIRONOLACTONE 25 MG PO TABS: 100.0000 mg | ORAL_TABLET | Freq: Every day | ORAL | Status: DC

## 2016-12-23 MED ORDER — SPIRONOLACTONE 25 MG PO TABS
100.0000 mg | ORAL_TABLET | Freq: Every day | ORAL | Status: DC
  Administered 2016-12-24: 100 mg via ORAL
  Filled 2016-12-23: qty 4

## 2016-12-23 MED ORDER — ZOLPIDEM TARTRATE 5 MG PO TABS
5.0000 mg | ORAL_TABLET | Freq: Every evening | ORAL | Status: DC | PRN
Start: 2016-12-23 — End: 2016-12-24
  Administered 2016-12-23: 5 mg via ORAL
  Filled 2016-12-23: qty 1

## 2016-12-23 NOTE — Progress Notes (Signed)
PROGRESS NOTE                                                                                                                                                                                                             Patient Demographics:    [name] House, is a 71 y.o. male, DOB - 1945/11/10, KJZ:[id number]  Admit date - 12/20/2016   Admitting Physician Rise Patience, MD  Outpatient Primary MD for the patient is [name], MD  LOS - 3  Outpatient Specialists: GI Dr Henrene Pastor  Chief Complaint  Patient presents with  . Weakness       Brief Narrative   71 y.o. male with history of nonalcoholic liver cirrhosis, chronic anemia and thrombocytopenia with history of esophageal varices , Presents with weakness, workup significant for ascitis, that is post paracentesis with 10 L drained, no SBP.   Subjective:    [name] House today has, No headache, No chest pain, No abdominal pain - No Nausea, No Cough - SOB.    Assessment  & Plan :    Principal Problem:   Ascites Active Problems:   Iron deficiency anemia   Sleep apnea   S/P gastric bypass   Weakness   Liver cirrhosis secondary to NASH (HCC)   CKD (chronic kidney disease) stage 4, GFR 15-29 ml/min (HCC)   Liver lesion  Liver cirrhosis secondary to [name] - Patient presents with worsening lower extremity/scrotal edema, most likely related to decompensated liver failure. - Initially on empiric SBP antibiotic coverage, status post paracentesis 3/16 with similar liters drained, no evidence of SBP, Rocephin stopped - Elevated ammonia level, no encephalopathy, continue with lactulose, Xifaxan if needed, no neomycin given renal failure. - Renal function is improving, resumed on aldactone 100 mg oral daily, will resume lasix - nutrition cosnult To educate patient about 2 g salt diet  Hemoccult-positive stool - No evidence of significant bleeding, hemoglobin at baseline, patient with known history  of varices, but no evidence of significant GI bleed, octreotide has been stopped.  AKI on CK D stage III - Continueto monitor closely , Lasix on hold, creatinine is improving, so resumed on Aldactone today  Chronic thrombocytopenia - Secondary to liver cirrhosis, no indication for transfusion  Weakness and debilitation - PT consulted,  Abnormal CT finding in January 2017 showing small liver lesions - MRI  liver negative for any evidence of HCC  Anemia - Secondary to chronic illness and liver failure, Hemoccult-positive stool but no evidence of significant bleeding, continue to monitor hemoglobin and transfuse as needed   Code Status : Full  Family Communication  : Discussed with wife via phone 3/18  Disposition Plan  : Followed  by PT,may need SNF  Consults  :  GI  Procedures  : Paracentesis 3/16 with 10 L drained  DVT Prophylaxis  :   SCDs , no chemical prophylaxis giving thrombocytopenia  Lab Results  Component Value Date   PLT 31 (L) 12/23/2016    Antibiotics  :    Anti-infectives    Start     Dose/Rate Route Frequency Ordered Stop   12/20/16 1000  neomycin (MYCIFRADIN) tablet 1,000 mg  Status:  Discontinued     1,000 mg Oral 2 times daily 12/20/16 0511 12/20/16 1713   12/20/16 0600  cefTRIAXone (ROCEPHIN) 2 g in dextrose 5 % 50 mL IVPB  Status:  Discontinued     2 g 100 mL/hr over 30 Minutes Intravenous Every 24 hours 12/20/16 0524 12/20/16 1752        Objective:   Vitals:   12/22/16 1421 12/22/16 2034 12/23/16 0500 12/23/16 0504  BP: (!) 138/55 132/61  (!) 113/56  Pulse: 75 73  70  Resp: 18 20  20   Temp: 98 F (36.7 C) 98.4 F (36.9 C)  98.5 F (36.9 C)  TempSrc: Oral Oral  Oral  SpO2: 99% 98%  95%  Weight:   98 kg (216 lb 0.8 oz)   Height:        Wt Readings from Last 3 Encounters:  12/23/16 98 kg (216 lb 0.8 oz)  11/13/16 91.9 kg (202 lb 9.6 oz)  06/27/16 84.1 kg (185 lb 6 oz)     Intake/Output Summary (Last 24 hours) at 12/23/16 1318 Last  data filed at 12/23/16 0819  Gross per 24 hour  Intake              920 ml  Output                1 ml  Net              919 ml     Physical Exam  Awake Alert, Oriented X 3,Frail, chronically ill-appearing Supple Neck,No JVD,  Symmetrical Chest wall movement, Good air movement bilaterally, CTAB RRR,No Gallops,Rubs or new Murmurs, No Parasternal Heave +ve B.Sounds, Abd Soft, Ascites significantly sided, scrotal swelling resolved, No tenderness, No Cyanosis, Clubbing ,mild pitting  edema    Data Review:    CBC  Recent Labs Lab 12/20/16 0137 12/20/16 0536 12/22/16 0531 12/23/16 0539  WBC 3.8* 3.0* 1.5* 2.0*  HGB 10.7* 9.7* 8.3* 8.8*  HCT 30.3* 28.1* 23.9* 24.9*  PLT 38* 40* 31* 31*  MCV 88.3 86.7 88.2 85.0  MCH 31.2 29.9 30.6 30.0  MCHC 35.3 34.5 34.7 35.3  RDW 14.5 14.4 14.2 13.9  LYMPHSABS 0.2*  --   --  0.3*  MONOABS 0.2  --   --  0.1  EOSABS 0.0  --   --  0.1  BASOSABS 0.0  --   --  0.0    Chemistries   Recent Labs Lab 12/20/16 0137 12/20/16 0541 12/21/16 0609 12/22/16 0531 12/23/16 0539  NA 140 141 136 139 136  K 3.9 4.0 3.6 3.4* 3.8  CL 108 110 105 111 110  CO2 22 24 24 23  21*  GLUCOSE 184* 166* 136* 117* 101*  BUN 25* 25* 21* 19 20  CREATININE 1.89* 1.96* 1.92* 1.67* 1.41*  CALCIUM 8.4* 8.5* 7.9* 8.0* 7.9*  AST 47*  --  41  --  31  ALT 27  --  24  --  20  ALKPHOS 163*  --  118  --  96  BILITOT 2.4*  --  1.7*  --  1.5*   ------------------------------------------------------------------------------------------------------------------ No results for input(s): CHOL, HDL, LDLCALC, TRIG, CHOLHDL, LDLDIRECT in the last 72 hours.  Lab Results  Component Value Date   HGBA1C 5.2 12/20/2016   ------------------------------------------------------------------------------------------------------------------ No results for input(s): TSH, T4TOTAL, T3FREE, THYROIDAB in the last 72 hours.  Invalid input(s):  FREET3 ------------------------------------------------------------------------------------------------------------------ No results for input(s): VITAMINB12, FOLATE, FERRITIN, TIBC, IRON, RETICCTPCT in the last 72 hours.  Coagulation profile  Recent Labs Lab 12/20/16 0153  INR 1.41    No results for input(s): DDIMER in the last 72 hours.  Cardiac Enzymes  Recent Labs Lab 12/20/16 0541  TROPONINI 0.04*   ------------------------------------------------------------------------------------------------------------------ No results found for: BNP  Inpatient Medications  Scheduled Meds: . lactulose  30 g Oral BID  . pantoprazole  40 mg Oral Daily  . spironolactone  100 mg Oral Daily   Continuous Infusions:  PRN Meds:.Gerhardt's butt cream, ondansetron **OR** ondansetron (ZOFRAN) IV  Micro Results Recent Results (from the past 240 hour(s))  Culture, body fluid-bottle     Status: None (Preliminary result)   Collection Time: 12/20/16  4:31 PM  Result Value Ref Range Status   Specimen Description FLUID PERITONEAL  Final   Special Requests BOTTLES DRAWN AEROBIC AND ANAEROBIC 10CC  Final   Culture   Final    NO GROWTH 2 DAYS Performed at [facility], [place] [address] [place], [place] [address]    Report Status PENDING  Incomplete  Gram stain     Status: None   Collection Time: 12/20/16  4:31 PM  Result Value Ref Range Status   Specimen Description FLUID PERITONEAL  Final   Special Requests NONE  Final   Gram Stain   Final    RARE WBC PRESENT, PREDOMINANTLY MONONUCLEAR NO ORGANISMS SEEN Performed at [facility], [place] [address] [place],  [address]    Report Status 12/20/2016 FINAL  Final    Radiology Reports Mr Liver W Wo Contrast  Result Date: 12/21/2016 CLINICAL DATA:  71 year old male with history of NASH cirrhosis. Abnormal CT scan 11/07/2015 demonstrating a hypervascular liver lesion. Followup study. EXAM: MRI ABDOMEN WITHOUT AND WITH  CONTRAST TECHNIQUE: Multiplanar multisequence MR imaging of the abdomen was performed both before and after the administration of intravenous contrast. CONTRAST:  5 mL of Eovist. COMPARISON:  No prior abdominal MRI. CT the abdomen and pelvis 11/07/2015. FINDINGS: Lower chest: Trace bilateral pleural effusions lying dependently. Massive distal esophageal varices. Hepatobiliary: The liver has a shrunken appearance and nodular contour, compatible with advanced cirrhosis. In segment 2 of the liver there is a 1.3 cm T1 hypointense, T2 hyperintense, nonenhancing lesion, compatible with a simple cyst. Similar appearing exophytic lesion extending off the posterior aspect of the right lobe of the liver between segments 6 and 7 is also compatible with a cyst. No other aggressive appearing hepatic lesions are noted. Specifically, no finding to suggest underlying hepatocellular carcinoma. No intra or extrahepatic biliary ductal dilatation. Status post cholecystectomy. Pancreas: No pancreatic mass. No pancreatic ductal dilatation. Pancreatic atrophy. Spleen: Spleen is enlarged measuring 16.6 x 8.7 x 22.9 cm (estimated splenic volume  of 1,654 mL). Adrenals/Urinary Tract: Kidneys are incompletely visualized on all pulse sequences and poorly evaluated on today's study. The right kidney demonstrates several T1 hypointense, T2 hyperintense, nonenhancing lesions measuring up to 7.6 cm in the upper pole, compatible with simple cysts. Bilateral adrenal glands are normal in appearance. Stomach/Bowel: Visualized portions are unremarkable. Vascular/Lymphatic: No aneurysm identified in the abdominal arterial vasculature. Portal vein is markedly dilated measuring up to 26 mm in diameter in the porta hepatis. Numerous portosystemic collateral pathways are noted, including dilated recannulated paraumbilical vein and numerous massively enlarged distal esophageal varices. Other:  Large volume of ascites. Musculoskeletal: No aggressive appearing  osseous lesions are noted in the visualized portions of the skeleton. IMPRESSION: 1. Stigmata of advanced cirrhosis with severe portal hypertension as demonstrated by dilated portal vein, severe splenomegaly and numerous large portosystemic collateral pathways, most notable for massive distal esophageal varices. 2. No hypervascular hepatic lesion noted to suggest hepatocellular carcinoma at this time. 3. Large volume of ascites. 4. Trace bilateral pleural effusions lying dependently. 5. Additional incidental findings, as above. Electronically Signed   By: Vinnie Langton M.D.   On: 12/21/2016 17:34   Dg Pelvis Portable  Result Date: 12/20/2016 CLINICAL DATA:  Fever off and on for 1 week. Found on the floor this morning and unable to get of. EXAM: PORTABLE PELVIS 1-2 VIEWS COMPARISON:  CT abdomen and pelvis 11/07/2015 FINDINGS: Postoperative right hip arthroplasty. Degenerative changes in the lower lumbar spine and left hip. No acute fracture or dislocation of the pelvis. SI joints and symphysis pubis are not displaced. IMPRESSION: No acute bony abnormalities. Degenerative changes in the lower lumbar spine and left hip. Right hip arthroplasty. Electronically Signed   By: Lucienne Capers M.D.   On: 12/20/2016 04:01   US Paracentesis  Result Date: 12/20/2016 INDICATION: Patient with a history of nonalcoholic cirrhosis. Abdominal ascites. Request is made for diagnostic and therapeutic paracentesis. EXAM: ULTRASOUND GUIDED DIAGNOSTIC AND THERAPEUTIC PARACENTESIS MEDICATIONS: 1% lidocaine COMPLICATIONS: None immediate. PROCEDURE: Informed written consent was obtained from the patient after a discussion of the risks, benefits and alternatives to treatment. A timeout was performed prior to the initiation of the procedure. Initial ultrasound scanning demonstrates a mA amount of ascites within the left upper abdominal quadrant. The left upper abdomen was prepped and draped in the usual sterile fashion. 1% lidocaine  was used for local anesthesia. Following this, a 19 gauge, 7-cm, Yueh catheter was introduced. An ultrasound image was saved for documentation purposes. The paracentesis was performed. The catheter was removed and a dressing was applied. The patient tolerated the procedure well without immediate post procedural complication. FINDINGS: A total of approximately 10.1 L of clear yellow fluid was removed. Samples were sent to the laboratory as requested by the clinical team. IMPRESSION: Successful ultrasound-guided paracentesis yielding 10.1 liters of peritoneal fluid. Read by: Saverio Danker, PA-C Electronically Signed   By: Jerilynn Mages.  Shick M.D.   On: 12/20/2016 16:21   Dg Chest Port 1 View  Result Date: 12/20/2016 CLINICAL DATA:  Fever off and on for 1 week.  Fall. EXAM: PORTABLE CHEST 1 VIEW COMPARISON:  11/13/2016 FINDINGS: Shallow inspiration. Heart size and pulmonary vascularity are normal. No focal airspace disease or consolidation in the lungs. No pneumothorax. No blunting of costophrenic angles. Calcified and tortuous aorta. IMPRESSION: Shallow inspiration.  No evidence of active pulmonary disease. Electronically Signed   By: Lucienne Capers M.D.   On: 12/20/2016 04:02     [facility], Jakaylah Schlafer M.D on 12/23/2016 at 1:18 PM  Between 7am to 7pm - Pager - [phone]  After 7pm go to [email or web address] - password [facility]  [facility] -  Office  [phone]

## 2016-12-23 NOTE — Progress Notes (Signed)
[facility] Progress Note  Chief Complaint:   Cirrhosis with ascites  Subjective: Feels okay. No abdominal discomfort.   Objective:  Vital signs in last 24 hours: Temp:  [98 F (36.7 C)-98.5 F (36.9 C)] 98.5 F (36.9 C) (03/19 0504) Pulse Rate:  [70-75] 70 (03/19 0504) Resp:  [18-20] 20 (03/19 0504) BP: (113-138)/(55-61) 113/56 (03/19 0504) SpO2:  [95 %-99 %] 95 % (03/19 0504) Weight:  [216 lb 0.8 oz (98 kg)] 216 lb 0.8 oz (98 kg) (03/19 0500) Last BM Date: 12/22/16 General:   Alert, well-developed, white male in bedside chair eating a hamburger EENT:  Normal hearing, non icteric sclera, conjunctive pink.  Heart:  Regular rate and rhythm, trace BLE edema Pulm: Normal respiratory effort, lungs CTA bilaterally without wheezes or crackles. Abdomen:  Soft, mildly distended, normal bowel sounds, no masses felt. No hepatomegaly.    Neurologic:  Alert and  oriented x4;  grossly normal neurologically.No asterixis Psych:  Alert and cooperative. Normal mood and affect. Skin:  Intake/Output from previous day: 03/18 0701 - 03/19 0700 In: 1640 [P.O.:1640] Out: 1 [Stool:1] Intake/Output this shift: Total I/O In: 240 [P.O.:240] Out: -   Lab Results:  Recent Labs  12/22/16 0531 12/23/16 0539  WBC 1.5* 2.0*  HGB 8.3* 8.8*  HCT 23.9* 24.9*  PLT 31* 31*   BMET  Recent Labs  12/21/16 0609 12/22/16 0531 12/23/16 0539  NA 136 139 136  K 3.6 3.4* 3.8  CL 105 111 110  CO2 24 23 21*  GLUCOSE 136* 117* 101*  BUN 21* 19 20  CREATININE 1.92* 1.67* 1.41*  CALCIUM 7.9* 8.0* 7.9*   LFT  Recent Labs  12/23/16 0539  PROT 4.9*  ALBUMIN 3.1*  AST 31  ALT 20  ALKPHOS 96  BILITOT 1.5*   PT/INR No results for input(s): LABPROT, INR in the last 72 hours. Hepatitis Panel No results for input(s): HEPBSAG, HCVAB, HEPAIGM, HEPBIGM in the last 72 hours.  Mr Liver W Wo Contrast  Result Date: 12/21/2016 CLINICAL DATA:  71 year old male with history of NASH  cirrhosis. Abnormal CT scan 11/07/2015 demonstrating a hypervascular liver lesion. Followup study. EXAM: MRI ABDOMEN WITHOUT AND WITH CONTRAST TECHNIQUE: Multiplanar multisequence MR imaging of the abdomen was performed both before and after the administration of intravenous contrast. CONTRAST:  5 mL of Eovist. COMPARISON:  No prior abdominal MRI. CT the abdomen and pelvis 11/07/2015. FINDINGS: Lower chest: Trace bilateral pleural effusions lying dependently. Massive distal esophageal varices. Hepatobiliary: The liver has a shrunken appearance and nodular contour, compatible with advanced cirrhosis. In segment 2 of the liver there is a 1.3 cm T1 hypointense, T2 hyperintense, nonenhancing lesion, compatible with a simple cyst. Similar appearing exophytic lesion extending off the posterior aspect of the right lobe of the liver between segments 6 and 7 is also compatible with a cyst. No other aggressive appearing hepatic lesions are noted. Specifically, no finding to suggest underlying hepatocellular carcinoma. No intra or extrahepatic biliary ductal dilatation. Status post cholecystectomy. Pancreas: No pancreatic mass. No pancreatic ductal dilatation. Pancreatic atrophy. Spleen: Spleen is enlarged measuring 16.6 x 8.7 x 22.9 cm (estimated splenic volume of 1,654 mL). Adrenals/Urinary Tract: Kidneys are incompletely visualized on all pulse sequences and poorly evaluated on today's study. The right kidney demonstrates several T1 hypointense, T2 hyperintense, nonenhancing lesions measuring up to 7.6 cm in the upper pole, compatible with simple cysts. Bilateral adrenal glands are normal in appearance. Stomach/Bowel: Visualized portions are unremarkable. Vascular/Lymphatic: No aneurysm identified in  the abdominal arterial vasculature. Portal vein is markedly dilated measuring up to 26 mm in diameter in the porta hepatis. Numerous portosystemic collateral pathways are noted, including dilated recannulated paraumbilical vein  and numerous massively enlarged distal esophageal varices. Other:  Large volume of ascites. Musculoskeletal: No aggressive appearing osseous lesions are noted in the visualized portions of the skeleton. IMPRESSION: 1. Stigmata of advanced cirrhosis with severe portal hypertension as demonstrated by dilated portal vein, severe splenomegaly and numerous large portosystemic collateral pathways, most notable for massive distal esophageal varices. 2. No hypervascular hepatic lesion noted to suggest hepatocellular carcinoma at this time. 3. Large volume of ascites. 4. Trace bilateral pleural effusions lying dependently. 5. Additional incidental findings, as above. Electronically Signed   By: Vinnie Langton M.D.   On: 12/21/2016 17:34    Assessment / Plan:  1. 71 yo male with decompensated NASH cirrhosis, s/p paracentesis with removal of 10 liters of fluid. No SBP.  -He has been compliant with diuretics at home but NOT following a low sodium diet. Doesn't know what he can eat on a low sodium diet. Will ask Dietician to see. -restart lasix. Was apparently on 80 mg daily at home but given AKI on CKD will restart at 20mg  / day -am bmet -I+0 -daily weights -Someone has talked with him about a TIPS. He may need one but hasn't been limiting salt intake at home so could try that first  -There was concern about a liver lesion on imaging but f/u MRI negative for [place].   2. AKI superimposed on CKD 3. Diuretics have been on hold. Cr steadingly improving. Cr 1.4 today.  Aldactone restarted yesterday. Will resume lasix today but only 20mg  for now.  He has a + fluid balance of 1000 ml. Weight up 4 pounds since admission.  3. Heme positive stools, anemia. Probably related to portal HTN. Hgb stable, actually over overnight 8.3 >>>8.8.    Principal Problem:   Ascites Active Problems:   Iron deficiency anemia   Sleep apnea   S/P gastric bypass   Weakness   Liver cirrhosis secondary to NASH (HCC)   CKD (chronic  kidney disease) stage 4, GFR 15-29 ml/min (HCC)   Liver lesion    LOS: 3 days   [name] Savoy NP 12/23/2016, 11:55 AM  Pager number [phone]  GI ATTENDING  Interval history data reviewed. Patient personally seen and examined. Patient known to me regarding his liver disease. He is admitted with anasarca and massive ascites. Had been on diuretics at home. Has renal insufficiency. Very advanced liver disease. Also history of hepatic encephalopathy. Chief complaint today is edema of the scrotum and penis. Recommend reintroduction of diuretics as his creatinine is improving (but not normal). May need periodic paracenteses that we would need to watch the volume and provide albumin replacement. We are having dietary see him regarding 2 g sodium diet. Recommend athletic supporter for his swollen genitals. Given his history of hepatic encephalopathy and renal insufficiency this patient is NOT a candidate for TIPS. I discussed this with him. Unfortunately, his prognosis is poor. We will follow  Docia Chuck. Geri Seminole., M.D. [facility]

## 2016-12-23 NOTE — Progress Notes (Signed)
  Physical Therapy Treatment Patient Details Name: [name] House MRN: [id number] DOB: 05/20/1946 Today's Date: 12/23/2016    History of Present Illness [name] House is a 71 y.o. male with medical history significant of NASH cirrhosis, gastric bypass, chronic iron def anemia, chronic thrombocytopenia, hepatic encephalopathy. Had a fall at home 12/20/16 and EMT called as he could not get up.  In the ER on exam patient was found to have large ascites with scrotal edema and lower extremity edema. Stool for occult blood was positive but patient has not complained of any active bleeding.  s/p paracentesis with removal of 10 liters of fluid    PT Comments    Pt mobilizing very well today and tolerated 800 feet with RW.  Pt prefers to d/c home.  Recommend HHPT for home safety evaluation.  Follow Up Recommendations  Home health PT;Supervision - Intermittent     Equipment Recommendations  Rolling walker with 5" wheels    Recommendations for Other Services       Precautions / Restrictions Precautions Precautions: Fall    Mobility  Bed Mobility                  Transfers Overall transfer level: Needs assistance Equipment used: Rolling walker (2 wheeled) Transfers: Sit to/from Stand Sit to Stand: Min guard;Supervision            Ambulation/Gait Ambulation/Gait assistance: Min guard;Supervision Ambulation Distance (Feet): 800 Feet Assistive device: Rolling walker (2 wheeled) Gait Pattern/deviations: Step-through pattern     General Gait Details: variable speeds without LOB or unsteadiness, tolerated distance well   Stairs            Wheelchair Mobility    Modified Rankin (Stroke Patients Only)       Balance                                    Cognition Arousal/Alertness: Awake/alert Behavior During Therapy: Flat affect;WFL for tasks assessed/performed Overall Cognitive Status: Within Functional Limits for tasks assessed                       Exercises      General Comments        Pertinent Vitals/Pain Pain Assessment: No/denies pain    Home Living                      Prior Function            PT Goals (current goals can now be found in the care plan section) Progress towards PT goals: Progressing toward goals    Frequency    Min 3X/week      PT Plan Current plan remains appropriate    Co-evaluation             End of Session   Activity Tolerance: Patient tolerated treatment well Patient left: in chair;with call bell/phone within reach Nurse Communication: Mobility status PT Visit Diagnosis: Difficulty in walking, not elsewhere classified (R26.2);History of falling (Z91.81)     Time: 9937-1696 PT Time Calculation (min) (ACUTE ONLY): 22 min  Charges:  $Gait Training: 8-22 mins                    G Codes:       Khalilah Hoke,KATHrine E 12/23/2016, 2:14 PM Carmelia Bake, PT, DPT 12/23/2016 Pager: [phone]

## 2016-12-23 NOTE — Progress Notes (Signed)
PHARMACIST - PHYSICIAN COMMUNICATION  DR:   Elgergawy  CONCERNING: IV to Oral Route Change Policy  RECOMMENDATION: This patient is receiving Protonix by the intravenous route.  Based on criteria approved by the Pharmacy and Therapeutics Committee, the intravenous medication(s) is/are being converted to the equivalent oral dose form(s).   DESCRIPTION: These criteria include:  The patient is eating (either orally or via tube) and/or has been taking other orally administered medications for a least 24 hours  The patient has no evidence of active gastrointestinal bleeding or impaired GI absorption (gastrectomy, short bowel, patient on TNA or NPO).  If you have questions about this conversion, please contact the Pharmacy Department  []   [phone] )  [facility] []   [phone] )  [facility] []   [phone] )  [facility] []   [phone] )  [facility] [x]   [phone] )  [place], [place], [facility] 12/23/2016 9:28 AM

## 2016-12-23 NOTE — Progress Notes (Signed)
[id number]/WVTVNR Davis,BSN,RN3,CCM/[phone]/spoke with patient is currently using [facility] and would like to continue with this agency.

## 2016-12-24 ENCOUNTER — Other Ambulatory Visit: Payer: Self-pay

## 2016-12-24 ENCOUNTER — Telehealth: Payer: Self-pay

## 2016-12-24 DIAGNOSIS — K7460 Unspecified cirrhosis of liver: Secondary | ICD-10-CM

## 2016-12-24 DIAGNOSIS — R188 Other ascites: Principal | ICD-10-CM

## 2016-12-24 LAB — CBC
HCT: 25.1 % — ABNORMAL LOW (ref 39.0–52.0)
Hemoglobin: 8.9 g/dL — ABNORMAL LOW (ref 13.0–17.0)
MCH: 30.1 pg (ref 26.0–34.0)
MCHC: 35.5 g/dL (ref 30.0–36.0)
MCV: 84.8 fL (ref 78.0–100.0)
PLATELETS: 33 10*3/uL — AB (ref 150–400)
RBC: 2.96 MIL/uL — AB (ref 4.22–5.81)
RDW: 14 % (ref 11.5–15.5)
WBC: 2.1 10*3/uL — ABNORMAL LOW (ref 4.0–10.5)

## 2016-12-24 LAB — BASIC METABOLIC PANEL
Anion gap: 4 — ABNORMAL LOW (ref 5–15)
BUN: 21 mg/dL — AB (ref 6–20)
CHLORIDE: 112 mmol/L — AB (ref 101–111)
CO2: 21 mmol/L — ABNORMAL LOW (ref 22–32)
CREATININE: 1.48 mg/dL — AB (ref 0.61–1.24)
Calcium: 8.1 mg/dL — ABNORMAL LOW (ref 8.9–10.3)
GFR calc Af Amer: 54 mL/min — ABNORMAL LOW (ref >60)
GFR calc non Af Amer: 47 mL/min — ABNORMAL LOW (ref >60)
GLUCOSE: 115 mg/dL — AB (ref 65–99)
Potassium: 3.9 mmol/L (ref 3.5–5.1)
Sodium: 137 mmol/L (ref 135–145)

## 2016-12-24 MED ORDER — LACTULOSE 10 GM/15ML PO SOLN: 30.0000 g | Freq: Two times a day (BID) | ORAL | 0 refills | Status: DC

## 2016-12-24 MED ORDER — SPIRONOLACTONE 100 MG PO TABS: 100.0000 mg | ORAL_TABLET | Freq: Every day | ORAL | 0 refills | Status: DC

## 2016-12-24 MED ORDER — FUROSEMIDE 20 MG PO TABS: 20.0000 mg | ORAL_TABLET | Freq: Every day | ORAL | 1 refills | Status: DC

## 2016-12-24 MED ORDER — LACTULOSE ENCEPHALOPATHY 10 GM/15ML PO SOLN: 30.0000 g | Freq: Two times a day (BID) | ORAL | 3 refills | Status: DC

## 2016-12-24 NOTE — Progress Notes (Signed)
Pt discharging with [name] House, referral given to in house rep.

## 2016-12-24 NOTE — Plan of Care (Signed)
Problem: Food- and Nutrition-Related Knowledge Deficit (NB-1.1) Goal: Nutrition education Formal process to instruct or train a patient/client in a skill or to impart knowledge to help patients/clients voluntarily manage or modify food choices and eating behavior to maintain or improve health. Outcome: Completed/Met Date Met: 12/24/16 Nutrition Education Note  RD consulted for nutrition education regarding a Low Sodium diet.   RD provided "Low Sodium Nutrition Therapy" handout from the Academy of [employer]etics. Reviewed patient's dietary recall. Provided examples on ways to decrease sodium intake in diet. Discouraged intake of processed foods and use of salt shaker. Encouraged fresh fruits and vegetables as well as whole grain sources of carbohydrates to maximize fiber intake. Teach back method used.  Expect fair compliance. Needs family support.  Body mass index is 28.76 kg/m. Pt meets criteria for overweight based on current BMI.  Current diet order is 2GMNA diet, patient is consuming approximately 100% of meals at this time. Labs and medications reviewed. No further nutrition interventions warranted at this time. RD contact information provided. If additional nutrition issues arise, please re-consult RD.  Clayton Bibles, MS, RD, LDN Pager: [phone] After Hours Pager: [phone]

## 2016-12-24 NOTE — Care Management Important Message (Signed)
Important Message  Patient Details  Name: [name] House MRN: [id number] Date of Birth: 1946-08-19   Medicare Important Message Given:  Yes    Kerin Salen 12/24/2016, 10:38 AMImportant Message  Patient Details  Name: [name] House MRN: [id number] Date of Birth: 10-20-1945   Medicare Important Message Given:  Yes    Kerin Salen 12/24/2016, 10:38 AM

## 2016-12-24 NOTE — Discharge Summary (Signed)
[name] House, is a 71 y.o. male  DOB [birth year]  MRN [id number].  Admission date:  12/20/2016  Admitting Physician  Rise Patience, MD  Discharge Date:  12/24/2016   Primary MD  [name], MD  Recommendations for primary care physician for things to follow:  - Please check CBC, BMP during next visit - Patient to follow with Dr. Blanch Media office for blood work on 3/26, and follow-up appointment on 4/11   Admission Diagnosis  Fall [W19.XXXA] Generalized weakness [R53.1] Poorly controlled ascites [R18.8]   Discharge Diagnosis  Fall [W19.XXXA] Generalized weakness [R53.1] Poorly controlled ascites [R18.8]    Principal Problem:   Ascites Active Problems:   Iron deficiency anemia   Sleep apnea   S/P gastric bypass   Weakness   Liver cirrhosis secondary to NASH (HCC)   CKD (chronic kidney disease) stage 4, GFR 15-29 ml/min (HCC)   Liver lesion   Anasarca   Renal insufficiency   Encephalopathy, hepatic (HCC)      Past Medical History:  Diagnosis Date  . Abdominal hernia   . Ascites   . Benign neoplasm of colon   . Cirrhosis ([place])   . Diabetes mellitus, type 2 ([place])    resolved with gastric bypass  . Diverticulosis of colon (without mention of hemorrhage)   . Esophageal varices ([place])   . Hyperlipemia   . Hypertension   . Iron deficiency anemia secondary to blood loss (chronic)   . Kidney stones   . Liver cyst   . Lymphopenia   . NASH (nonalcoholic steatohepatitis)   . Osteoarthritis of hip   . Renal cyst   . Splenomegaly   . Unspecified sleep apnea    Resolved    Past Surgical History:  Procedure Laterality Date  . ANKLE SURGERY     [place]il  . COLONOSCOPY  2012  . GASTRIC BYPASS  2005  . KNEE SURGERY     Bil  . POLYPECTOMY    . TOTAL HIP ARTHROPLASTY  2008   right       History of present illness and  Hospital Course:     Kindly see H&P for history of  present illness and admission details, please review complete Labs, Consult reports and Test reports for all details in brief  HPI  from the history and physical done on the day of admission 12/20/2016 HPI: [name] House is a 71 y.o. male with history of nonalcoholic liver cirrhosis, chronic anemia and thrombocytopenia with history of esophageal varices was brought to the ER after patient had a fall at home and was found weak to ambulate. Patient states last night patient was walking to the bathroom and patient's leg gave way and fell onto the floor. Denies hitting his head almost losing consciousness. After the fall patient was unable to get up. Patient's wife called EMS and patient was brought to the ER. Patient denies any chest pain or shortness of breath nausea vomiting or diarrhea.   ED Course: In the ER on exam patient  is found to have large ascites with scrotal edema and lower extremity edema. Stool for occult blood was positive but patient has not complained of any active bleeding. Hemoglobin appears to be at baseline with creatinine mildly elevated from baseline. EKG was showing normal sinus rhythm with x-ray of the pelvis and chest x-ray was unremarkable. Patient is being admitted for further observation for weakness possible GI bleed and also needs further management of ascites.   Hospital Course  71 y.o.malewith history of nonalcoholic liver cirrhosis, chronic anemia and thrombocytopenia with history of esophageal varices , Presents with weakness, workup significant for ascitis, that is post paracentesis with 10 L drained, no SBP.  Liver cirrhosis secondary to [facility] - Patient presents with worsening lower extremity/scrotal edema, most likely related to decompensated liver failure. - Initially on empiric SBP antibiotic coverage, status post paracentesis 3/16 with similar liters drained, no evidence of SBP, Rocephin stopped - Elevated ammonia level, but no encephalopathy, continue with  lactulose, no stopped  given renal failure. - Renal function is improving, resumed on aldactone 100 mg oral daily, and lasix 20 mg oral daily, to follow and Dr. Darnell Level office on 3/26 for labs, and follow-up appointment on 4/11 - nutrition cosnulted To educate patient about 2 g salt diet  Hemoccult-positive stool - No evidence of significant bleeding, hemoglobin at baseline, patient with known history of varices, but no evidence of significant GI bleed, octreotide has been stopped.  AKI on CK D stage III - Continueto monitor closely , Lasix on hold, creatinine is improving, so resumed on Aldactone today  Chronic thrombocytopenia - Secondary to liver cirrhosis, no indication for transfusion  Weakness and debilitation - PT consulted, started home with home PT  Abnormal CT finding in January 2017 showing small liver lesions - MRI liver negative for any evidence of HCC  Anemia - Secondary to chronic illness and liver failure, Hemoccult-positive stool but no evidence of significant bleeding, continue to monitor hemoglobin and transfuse as needed   Discharge Condition:  Stable   Follow UP  Follow-up Information    Scarlette Shorts, MD Follow up on 01/15/2017.   Specialty:  Gastroenterology Why:  9:30 am on 01/15/2017 as well please call to Dr. Henrene Pastor  office on 3/26 for blood work Contact information: [address] [place] [address] [phone]        [name], MD Follow up.   Specialty:  Internal Medicine Contact information: [address] [place] [address] [phone]             Discharge Instructions  and  Discharge Medications    Discharge Instructions    Discharge instructions    Complete by:  As directed    Follow with Primary MD [name], MD in 7 days   Get CBC, CMP,  checked  by Primary MD next visit.    Activity: As tolerated with Full fall precautions use walker/cane & assistance as needed   Disposition Home     Diet: 2 g sodium  For Heart failure patients - Check your Weight same time everyday, if you gain over 2 pounds, or you develop in leg swelling, experience more shortness of breath or chest pain, call your Primary MD immediately. Follow Cardiac Low Salt Diet and 1.5 lit/day fluid restriction.   On your next visit with your primary care physician please Get Medicines reviewed and adjusted.   Please request your Prim.MD to go over all Hospital Tests and Procedure/Radiological results at the follow up, please get  all Hospital records sent to your Prim MD by signing hospital release before you go home.   If you experience worsening of your admission symptoms, develop shortness of breath, life threatening emergency, suicidal or homicidal thoughts you must seek medical attention immediately by calling 911 or calling your MD immediately  if symptoms less severe.  You Must read complete instructions/literature along with all the possible adverse reactions/side effects for all the Medicines you take and that have been prescribed to you. Take any new Medicines after you have completely understood and accpet all the possible adverse reactions/side effects.   Do not drive, operating heavy machinery, perform activities at heights, swimming or participation in water activities or provide baby sitting services if your were admitted for syncope or siezures until you have seen by Primary MD or a Neurologist and advised to do so again.  Do not drive when taking Pain medications.    Do not take more than prescribed Pain, Sleep and Anxiety Medications  Special Instructions: If you have smoked or chewed Tobacco  in the last 2 yrs please stop smoking, stop any regular Alcohol  and or any Recreational drug use.  Wear Seat belts while driving.   Please note  You were cared for by a hospitalist during your hospital stay. If you have any questions about your discharge medications or the care you received  while you were in the hospital after you are discharged, you can call the unit and asked to speak with the hospitalist on call if the hospitalist that took care of you is not available. Once you are discharged, your primary care physician will handle any further medical issues. Please note that NO REFILLS for any discharge medications will be authorized once you are discharged, as it is imperative that you return to your primary care physician (or establish a relationship with a primary care physician if you do not have one) for your aftercare needs so that they can reassess your need for medications and monitor your lab values.   Increase activity slowly    Complete by:  As directed      Allergies as of 12/24/2016      Reactions   Oxycodone Other (See Comments)   Reaction:  Caused pt to fall       Medication List    STOP taking these medications   neomycin 500 MG tablet Commonly known as:  MYCIFRADIN   sodium bicarbonate 650 MG tablet     TAKE these medications   diphenoxylate-atropine 2.5-0.025 MG tablet Commonly known as:  LOMOTIL Take 1 tablet by mouth 4 (four) times daily as needed for diarrhea or loose stools.   furosemide 20 MG tablet Commonly known as:  LASIX Take 1 tablet (20 mg total) by mouth daily. Start taking on:  12/25/2016 What changed:  medication strength  how much to take   lactulose 10 GM/15ML solution Commonly known as:  CHRONULAC Take 45 mLs (30 g total) by mouth 2 (two) times daily.   spironolactone 100 MG tablet Commonly known as:  ALDACTONE Take 1 tablet (100 mg total) by mouth daily. Start taking on:  12/25/2016 What changed:  medication strength  how much to take         Diet and Activity recommendation: See Discharge Instructions above   Consults obtained -  GI   Major procedures and Radiology Reports - PLEASE review detailed and final reports for all details, in brief -      Mr Liver W Wo  Contrast  Result Date:  12/21/2016 CLINICAL DATA:  71 year old male with history of NASH cirrhosis. Abnormal CT scan 11/07/2015 demonstrating a hypervascular liver lesion. Followup study. EXAM: MRI ABDOMEN WITHOUT AND WITH CONTRAST TECHNIQUE: Multiplanar multisequence MR imaging of the abdomen was performed both before and after the administration of intravenous contrast. CONTRAST:  5 mL of Eovist. COMPARISON:  No prior abdominal MRI. CT the abdomen and pelvis 11/07/2015. FINDINGS: Lower chest: Trace bilateral pleural effusions lying dependently. Massive distal esophageal varices. Hepatobiliary: The liver has a shrunken appearance and nodular contour, compatible with advanced cirrhosis. In segment 2 of the liver there is a 1.3 cm T1 hypointense, T2 hyperintense, nonenhancing lesion, compatible with a simple cyst. Similar appearing exophytic lesion extending off the posterior aspect of the right lobe of the liver between segments 6 and 7 is also compatible with a cyst. No other aggressive appearing hepatic lesions are noted. Specifically, no finding to suggest underlying hepatocellular carcinoma. No intra or extrahepatic biliary ductal dilatation. Status post cholecystectomy. Pancreas: No pancreatic mass. No pancreatic ductal dilatation. Pancreatic atrophy. Spleen: Spleen is enlarged measuring 16.6 x 8.7 x 22.9 cm (estimated splenic volume of 1,654 mL). Adrenals/Urinary Tract: Kidneys are incompletely visualized on all pulse sequences and poorly evaluated on today's study. The right kidney demonstrates several T1 hypointense, T2 hyperintense, nonenhancing lesions measuring up to 7.6 cm in the upper pole, compatible with simple cysts. Bilateral adrenal glands are normal in appearance. Stomach/Bowel: Visualized portions are unremarkable. Vascular/Lymphatic: No aneurysm identified in the abdominal arterial vasculature. Portal vein is markedly dilated measuring up to 26 mm in diameter in the porta hepatis. Numerous portosystemic collateral  pathways are noted, including dilated recannulated paraumbilical vein and numerous massively enlarged distal esophageal varices. Other:  Large volume of ascites. Musculoskeletal: No aggressive appearing osseous lesions are noted in the visualized portions of the skeleton. IMPRESSION: 1. Stigmata of advanced cirrhosis with severe portal hypertension as demonstrated by dilated portal vein, severe splenomegaly and numerous large portosystemic collateral pathways, most notable for massive distal esophageal varices. 2. No hypervascular hepatic lesion noted to suggest hepatocellular carcinoma at this time. 3. Large volume of ascites. 4. Trace bilateral pleural effusions lying dependently. 5. Additional incidental findings, as above. Electronically Signed   By: Vinnie Langton M.D.   On: 12/21/2016 17:34   Dg Pelvis Portable  Result Date: 12/20/2016 CLINICAL DATA:  Fever off and on for 1 week. Found on the floor this morning and unable to get of. EXAM: PORTABLE PELVIS 1-2 VIEWS COMPARISON:  CT abdomen and pelvis 11/07/2015 FINDINGS: Postoperative right hip arthroplasty. Degenerative changes in the lower lumbar spine and left hip. No acute fracture or dislocation of the pelvis. SI joints and symphysis pubis are not displaced. IMPRESSION: No acute bony abnormalities. Degenerative changes in the lower lumbar spine and left hip. Right hip arthroplasty. Electronically Signed   By: Lucienne Capers M.D.   On: 12/20/2016 04:01   US Paracentesis  Result Date: 12/20/2016 INDICATION: Patient with a history of nonalcoholic cirrhosis. Abdominal ascites. Request is made for diagnostic and therapeutic paracentesis. EXAM: ULTRASOUND GUIDED DIAGNOSTIC AND THERAPEUTIC PARACENTESIS MEDICATIONS: 1% lidocaine COMPLICATIONS: None immediate. PROCEDURE: Informed written consent was obtained from the patient after a discussion of the risks, benefits and alternatives to treatment. A timeout was performed prior to the initiation of the  procedure. Initial ultrasound scanning demonstrates a mA amount of ascites within the left upper abdominal quadrant. The left upper abdomen was prepped and draped in the usual sterile fashion. 1% lidocaine was  used for local anesthesia. Following this, a 19 gauge, 7-cm, Yueh catheter was introduced. An ultrasound image was saved for documentation purposes. The paracentesis was performed. The catheter was removed and a dressing was applied. The patient tolerated the procedure well without immediate post procedural complication. FINDINGS: A total of approximately 10.1 L of clear yellow fluid was removed. Samples were sent to the laboratory as requested by the clinical team. IMPRESSION: Successful ultrasound-guided paracentesis yielding 10.1 liters of peritoneal fluid. Read by: Saverio Danker, PA-C Electronically Signed   By: Jerilynn Mages.  Shick M.D.   On: 12/20/2016 16:21   Dg Chest Port 1 View  Result Date: 12/20/2016 CLINICAL DATA:  Fever off and on for 1 week.  Fall. EXAM: PORTABLE CHEST 1 VIEW COMPARISON:  11/13/2016 FINDINGS: Shallow inspiration. Heart size and pulmonary vascularity are normal. No focal airspace disease or consolidation in the lungs. No pneumothorax. No blunting of costophrenic angles. Calcified and tortuous aorta. IMPRESSION: Shallow inspiration.  No evidence of active pulmonary disease. Electronically Signed   By: Lucienne Capers M.D.   On: 12/20/2016 04:02    Micro Results    Recent Results (from the past 240 hour(s))  Culture, body fluid-bottle     Status: None (Preliminary result)   Collection Time: 12/20/16  4:31 PM  Result Value Ref Range Status   Specimen Description FLUID PERITONEAL  Final   Special Requests BOTTLES DRAWN AEROBIC AND ANAEROBIC 10CC  Final   Culture   Final    NO GROWTH 4 DAYS Performed at [facility], [place] [address], [place], [place] [address]    Report Status PENDING  Incomplete  Gram stain     Status: None   Collection Time: 12/20/16  4:31 PM   Result Value Ref Range Status   Specimen Description FLUID PERITONEAL  Final   Special Requests NONE  Final   Gram Stain   Final    RARE WBC PRESENT, PREDOMINANTLY MONONUCLEAR NO ORGANISMS SEEN Performed at [facility], [place] [address] [place],  [address]    Report Status 12/20/2016 FINAL  Final       Today   Subjective:   [name] House today has no headache,no chest or abdominal pain, no nausea, no vomiting, reports appetite has improved . Objective:   Blood pressure 134/61, pulse 81, temperature 97.9 F (36.6 C), temperature source Oral, resp. rate 18, height 6' (1.829 m), weight 96.2 kg (212 lb 1.3 oz), SpO2 100 %.   Intake/Output Summary (Last 24 hours) at 12/24/16 1632 Last data filed at 12/24/16 0600  Gross per 24 hour  Intake                0 ml  Output              625 ml  Net             -625 ml    Exam Awake Alert, Oriented X 3,Frail, chronically ill-appearing Supple Neck,No JVD,  Symmetrical Chest wall movement, Good air movement bilaterally, CTAB RRR,No Gallops,Rubs or new Murmurs, No Parasternal Heave +ve B.Sounds, Abd Soft, Ascites appears to be reaccumulating, scrotal swelling resolved, No tenderness, No Cyanosis, Clubbing ,mild pitting  edema  Data Review   CBC w Diff: Lab Results  Component Value Date   WBC 2.1 (L) 12/24/2016   HGB 8.9 (L) 12/24/2016   HGB 11.9 (L) 04/26/2016   HCT 25.1 (L) 12/24/2016   HCT 35.4 (L) 04/26/2016   PLT 33 (L) 12/24/2016  PLT 47 (L) 04/26/2016   LYMPHOPCT 14 12/23/2016   LYMPHOPCT 9.1 (L) 04/26/2016   MONOPCT 7 12/23/2016   MONOPCT 11.8 04/26/2016   EOSPCT 6 12/23/2016   EOSPCT 2.7 04/26/2016   BASOPCT 0 12/23/2016   BASOPCT 0.4 04/26/2016    CMP: Lab Results  Component Value Date   NA 137 12/24/2016   NA 138 04/26/2016   K 3.9 12/24/2016   K 5.4 (H) 04/26/2016   CL 112 (H) 12/24/2016   CL 112 (H) 01/13/2013   CO2 21 (L) 12/24/2016   CO2 12 (L) 04/26/2016   BUN 21 (H) 12/24/2016   BUN  67.3 (H) 04/26/2016   CREATININE 1.48 (H) 12/24/2016   CREATININE 3.2 (HH) 04/26/2016   PROT 4.9 (L) 12/23/2016   PROT 6.0 04/26/2016   PROT 6.4 04/26/2016   ALBUMIN 3.1 (L) 12/23/2016   ALBUMIN 3.8 04/26/2016   BILITOT 1.5 (H) 12/23/2016   BILITOT 1.00 04/26/2016   ALKPHOS 96 12/23/2016   ALKPHOS 176 (H) 04/26/2016   AST 31 12/23/2016   AST 47 (H) 04/26/2016   ALT 20 12/23/2016   ALT 66 (H) 04/26/2016  .   Total Time in preparing paper work, data evaluation and todays exam - 35 minutes  Zakayla Martinec M.D on 12/24/2016 at [facility]   Office  [phone]

## 2016-12-24 NOTE — Progress Notes (Signed)
[name] House [facility] Progress Note  CC:  Cirrhosis with ascites  Subjective:  Feels ok.  Asking when he can go home.  No abdominal pain.  Main complaint is scrotal and penile edema.  Objective:  Vital signs in last 24 hours: Temp:  [97.6 F (36.4 C)-98.7 F (37.1 C)] 98.7 F (37.1 C) (03/20 0644) Pulse Rate:  [70-78] 70 (03/20 0644) Resp:  [18-20] 18 (03/20 0644) BP: (115-146)/(68-73) 115/73 (03/20 0644) SpO2:  [98 %-100 %] 98 % (03/20 0644) Weight:  [212 lb 1.3 oz (96.2 kg)] 212 lb 1.3 oz (96.2 kg) (03/20 0500) Last BM Date: 12/16/16 General:  Alert, Well-developed, in NAD; sitting in chair at bedside Heart:  Regular rate and rhythm; no murmurs Pulm:  CTAB.  No increased WOB. Abdomen:  Soft, but quite distended.  BS present.  Non-tender.  Large umbilical hernia noted, non-tender.   Extremities:  Trace B/L LE edema. Neurologic:  Alert and oriented x 4;  grossly normal neurologically. Psych:  Alert and cooperative. Normal mood and affect.  Intake/Output from previous day: 03/19 0701 - 03/20 0700 In: 240 [P.O.:240] Out: 625 [Urine:625]  Lab Results:  Recent Labs  12/22/16 0531 12/23/16 0539 12/24/16 0545  WBC 1.5* 2.0* 2.1*  HGB 8.3* 8.8* 8.9*  HCT 23.9* 24.9* 25.1*  PLT 31* 31* 33*   BMET  Recent Labs  12/22/16 0531 12/23/16 0539 12/24/16 0545  NA 139 136 137  K 3.4* 3.8 3.9  CL 111 110 112*  CO2 23 21* 21*  GLUCOSE 117* 101* 115*  BUN 19 20 21*  CREATININE 1.67* 1.41* 1.48*  CALCIUM 8.0* 7.9* 8.1*   LFT  Recent Labs  12/23/16 0539  PROT 4.9*  ALBUMIN 3.1*  AST 31  ALT 20  ALKPHOS 96  BILITOT 1.5*   Assessment / Plan: 1. 71 yo male with decompensated NASH cirrhosis, s/p paracentesis with removal of 10 liters of fluid. No SBP.  Has associated thrombocytopenia with platelets of 33.  -He has been compliant with diuretics at home but NOT following a low sodium diet. Doesn't know what he can eat on a low sodium diet. Will ask Dietician to  see; consult placed. -Lasix restarted at 20 mg on 3/19. Was apparently on 80 mg daily at home but given AKI on CKD it was restarted at 20 mg.  Cr 1.48 today from 1.41 yesterday. -I+0 -daily weights -He is not a candidate for TIPS due to renal issues and history of HE. -May need periodic paracentesis with albumin. -There was concern about a liver lesion on imaging but f/u MRI negative for [place].   2. AKI superimposed on CKD 3. Diuretics have been on hold. Cr steadingly improving. Cr 1.41>>>1.48 today.  3. Heme positive stools, anemia. Probably related to portal HTN and chronic disease respectively. Hgb stable, 8.3 >>>8.8>>>8.9 grams.   **Has OV follow-up with Dr. Henrene Pastor on 4/11 at 9:30 am.  Placed in D/C instructions.  We are ok with discharge today and we will have him get a BMP on 3/26.   LOS: 4 days   [name] House, [name] D.  12/24/2016, 12:16 PM Pager number [phone]  GI ATTENDING  Interval history data reviewed. Patient personally seen and examined. Agree with interval progress note. I spent 45 minutes counseling the patient on management of his liver disease, medications, in follow-up. I also spent 25 minutes on the telephone with his wife regarding the same. In any event, okay to go home today. Medication and follow-up as listed below:  1. Aldactone 100 mg daily 2. Lasix 20 mg daily 3. Lactulose 30 mL twice daily 4. Good to Dr. Blanch Media office on Monday, March 26 for blood work 5. Office follow-up with Dr. Henrene Pastor on April 11 at 9:30 AM 6. Contact Dr. Blanch Media office in the interim for any questions or problems  [name] House N. Geri [place]in[place]., M.D. [facility] Division of Gastroenterology

## 2016-12-24 NOTE — Discharge Instructions (Signed)
Follow with Primary MD [name], MD in 7 days   Get CBC, CMP,  checked  by Primary MD next visit.    Activity: As tolerated with Full fall precautions use walker/cane & assistance as needed   Disposition Home    Diet: 2 g sodium  For Heart failure patients - Check your Weight same time everyday, if you gain over 2 pounds, or you develop in leg swelling, experience more shortness of breath or chest pain, call your Primary MD immediately. Follow Cardiac Low Salt Diet and 1.5 lit/day fluid restriction.   On your next visit with your primary care physician please Get Medicines reviewed and adjusted.   Please request your Prim.MD to go over all Hospital Tests and Procedure/Radiological results at the follow up, please get all Hospital records sent to your Prim MD by signing hospital release before you go home.   If you experience worsening of your admission symptoms, develop shortness of breath, life threatening emergency, suicidal or homicidal thoughts you must seek medical attention immediately by calling 911 or calling your MD immediately  if symptoms less severe.  You Must read complete instructions/literature along with all the possible adverse reactions/side effects for all the Medicines you take and that have been prescribed to you. Take any new Medicines after you have completely understood and accpet all the possible adverse reactions/side effects.   Do not drive, operating heavy machinery, perform activities at heights, swimming or participation in water activities or provide baby sitting services if your were admitted for syncope or siezures until you have seen by Primary MD or a Neurologist and advised to do so again.  Do not drive when taking Pain medications.    Do not take more than prescribed Pain, Sleep and Anxiety Medications  Special Instructions: If you have smoked or chewed Tobacco  in the last 2 yrs please stop smoking, stop any regular Alcohol  and or any  Recreational drug use.  Wear Seat belts while driving.   Please note  You were cared for by a hospitalist during your hospital stay. If you have any questions about your discharge medications or the care you received while you were in the hospital after you are discharged, you can call the unit and asked to speak with the hospitalist on call if the hospitalist that took care of you is not available. Once you are discharged, your primary care physician will handle any further medical issues. Please note that NO REFILLS for any discharge medications will be authorized once you are discharged, as it is imperative that you return to your primary care physician (or establish a relationship with a primary care physician if you do not have one) for your aftercare needs so that they can reassess your need for medications and monitor your lab values.

## 2016-12-24 NOTE — Telephone Encounter (Signed)
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 He is going home today. He needs a prescription for his lactulose. Please call in to [facility]. Also, we'll him to come in this Monday for a basic metabolic panel. He already has office follow-up with me on the 11th. I asked that he or his wife contact me (through you) if there are any interval questions or problems. Thanks.

## 2016-12-24 NOTE — Telephone Encounter (Signed)
Script sent to pharmacy.

## 2016-12-25 LAB — CULTURE, BODY FLUID W GRAM STAIN -BOTTLE: Culture: NO GROWTH

## 2016-12-27 ENCOUNTER — Other Ambulatory Visit (INDEPENDENT_AMBULATORY_CARE_PROVIDER_SITE_OTHER): Payer: PPO

## 2016-12-27 ENCOUNTER — Other Ambulatory Visit: Payer: Self-pay

## 2016-12-27 DIAGNOSIS — R188 Other ascites: Secondary | ICD-10-CM | POA: Diagnosis not present

## 2016-12-27 DIAGNOSIS — I2510 Atherosclerotic heart disease of native coronary artery without angina pectoris: Secondary | ICD-10-CM | POA: Diagnosis not present

## 2016-12-27 DIAGNOSIS — K7460 Unspecified cirrhosis of liver: Secondary | ICD-10-CM | POA: Diagnosis not present

## 2016-12-27 DIAGNOSIS — K7682 Hepatic encephalopathy: Secondary | ICD-10-CM

## 2016-12-27 DIAGNOSIS — K7290 Hepatic failure, unspecified without coma: Secondary | ICD-10-CM

## 2016-12-27 DIAGNOSIS — K7581 Nonalcoholic steatohepatitis (NASH): Principal | ICD-10-CM

## 2016-12-27 DIAGNOSIS — R7989 Other specified abnormal findings of blood chemistry: Secondary | ICD-10-CM

## 2016-12-27 DIAGNOSIS — M545 Low back pain: Secondary | ICD-10-CM | POA: Diagnosis not present

## 2016-12-27 DIAGNOSIS — Z6828 Body mass index (BMI) 28.0-28.9, adult: Secondary | ICD-10-CM | POA: Diagnosis not present

## 2016-12-27 DIAGNOSIS — M179 Osteoarthritis of knee, unspecified: Secondary | ICD-10-CM | POA: Diagnosis not present

## 2016-12-27 LAB — BASIC METABOLIC PANEL
BUN: 24 mg/dL — ABNORMAL HIGH (ref 6–23)
CO2: 22 mEq/L (ref 19–32)
Calcium: 8.6 mg/dL (ref 8.4–10.5)
Chloride: 110 mEq/L (ref 96–112)
Creatinine, Ser: 1.52 mg/dL — ABNORMAL HIGH (ref 0.40–1.50)
GFR: 48.43 mL/min — ABNORMAL LOW (ref >60.00)
Glucose, Bld: 187 mg/dL — ABNORMAL HIGH (ref 70–99)
Potassium: 4.9 mEq/L (ref 3.5–5.1)
Sodium: 135 mEq/L (ref 135–145)

## 2017-01-02 ENCOUNTER — Other Ambulatory Visit: Payer: Self-pay

## 2017-01-02 ENCOUNTER — Other Ambulatory Visit (INDEPENDENT_AMBULATORY_CARE_PROVIDER_SITE_OTHER): Payer: PPO

## 2017-01-02 DIAGNOSIS — K7682 Hepatic encephalopathy: Secondary | ICD-10-CM

## 2017-01-02 DIAGNOSIS — K7581 Nonalcoholic steatohepatitis (NASH): Secondary | ICD-10-CM | POA: Diagnosis not present

## 2017-01-02 DIAGNOSIS — K7290 Hepatic failure, unspecified without coma: Secondary | ICD-10-CM | POA: Diagnosis not present

## 2017-01-02 DIAGNOSIS — R7989 Other specified abnormal findings of blood chemistry: Secondary | ICD-10-CM

## 2017-01-02 DIAGNOSIS — K7460 Unspecified cirrhosis of liver: Secondary | ICD-10-CM | POA: Diagnosis not present

## 2017-01-02 DIAGNOSIS — R188 Other ascites: Secondary | ICD-10-CM | POA: Diagnosis not present

## 2017-01-02 LAB — BASIC METABOLIC PANEL
BUN: 27 mg/dL — ABNORMAL HIGH (ref 6–23)
CALCIUM: 8.7 mg/dL (ref 8.4–10.5)
CO2: 24 mEq/L (ref 19–32)
Chloride: 109 mEq/L (ref 96–112)
Creatinine, Ser: 1.74 mg/dL — ABNORMAL HIGH (ref 0.40–1.50)
GFR: 41.43 mL/min — AB (ref >60.00)
Glucose, Bld: 131 mg/dL — ABNORMAL HIGH (ref 70–99)
Potassium: 4.5 mEq/L (ref 3.5–5.1)
Sodium: 140 mEq/L (ref 135–145)

## 2017-01-02 MED ORDER — FUROSEMIDE 20 MG PO TABS: 20.0000 mg | ORAL_TABLET | Freq: Two times a day (BID) | ORAL | 1 refills | Status: DC

## 2017-01-08 ENCOUNTER — Other Ambulatory Visit: Payer: Self-pay

## 2017-01-08 ENCOUNTER — Other Ambulatory Visit (INDEPENDENT_AMBULATORY_CARE_PROVIDER_SITE_OTHER): Payer: PPO

## 2017-01-08 DIAGNOSIS — K7581 Nonalcoholic steatohepatitis (NASH): Secondary | ICD-10-CM

## 2017-01-08 DIAGNOSIS — K7460 Unspecified cirrhosis of liver: Secondary | ICD-10-CM | POA: Diagnosis not present

## 2017-01-08 LAB — BASIC METABOLIC PANEL
BUN: 28 mg/dL — ABNORMAL HIGH (ref 6–23)
CO2: 23 mEq/L (ref 19–32)
CREATININE: 1.61 mg/dL — AB (ref 0.40–1.50)
Calcium: 9 mg/dL (ref 8.4–10.5)
Chloride: 110 mEq/L (ref 96–112)
GFR: 45.32 mL/min — AB (ref >60.00)
Glucose, Bld: 140 mg/dL — ABNORMAL HIGH (ref 70–99)
Potassium: 4.5 mEq/L (ref 3.5–5.1)
Sodium: 140 mEq/L (ref 135–145)

## 2017-01-15 ENCOUNTER — Other Ambulatory Visit: Payer: Self-pay

## 2017-01-15 ENCOUNTER — Encounter: Payer: Self-pay | Admitting: Internal Medicine

## 2017-01-15 ENCOUNTER — Ambulatory Visit (INDEPENDENT_AMBULATORY_CARE_PROVIDER_SITE_OTHER): Payer: PPO | Admitting: Internal Medicine

## 2017-01-15 ENCOUNTER — Other Ambulatory Visit (INDEPENDENT_AMBULATORY_CARE_PROVIDER_SITE_OTHER): Payer: PPO

## 2017-01-15 VITALS — BP 120/60 | HR 68 | Ht 71.5 in | Wt 196.2 lb

## 2017-01-15 DIAGNOSIS — R188 Other ascites: Secondary | ICD-10-CM

## 2017-01-15 DIAGNOSIS — K7290 Hepatic failure, unspecified without coma: Secondary | ICD-10-CM | POA: Diagnosis not present

## 2017-01-15 DIAGNOSIS — K7581 Nonalcoholic steatohepatitis (NASH): Secondary | ICD-10-CM

## 2017-01-15 DIAGNOSIS — K7469 Other cirrhosis of liver: Secondary | ICD-10-CM

## 2017-01-15 DIAGNOSIS — N289 Disorder of kidney and ureter, unspecified: Secondary | ICD-10-CM

## 2017-01-15 DIAGNOSIS — K7682 Hepatic encephalopathy: Secondary | ICD-10-CM

## 2017-01-15 LAB — BASIC METABOLIC PANEL
BUN: 25 mg/dL — ABNORMAL HIGH (ref 6–23)
CALCIUM: 8.8 mg/dL (ref 8.4–10.5)
CO2: 23 meq/L (ref 19–32)
CREATININE: 1.63 mg/dL — AB (ref 0.40–1.50)
Chloride: 108 mEq/L (ref 96–112)
GFR: 44.67 mL/min — ABNORMAL LOW (ref >60.00)
Glucose, Bld: 138 mg/dL — ABNORMAL HIGH (ref 70–99)
Potassium: 4.3 mEq/L (ref 3.5–5.1)
Sodium: 139 mEq/L (ref 135–145)

## 2017-01-15 MED ORDER — FUROSEMIDE 20 MG PO TABS: 20.0000 mg | ORAL_TABLET | Freq: Two times a day (BID) | ORAL | 6 refills | Status: DC

## 2017-01-15 NOTE — Patient Instructions (Signed)
Your physician has requested that you go to the basement for the following lab work before leaving today:  BMET  We have sent the following medications to your pharmacy for you to pick up at your convenience:  Lasix  Please follow up in 2 months

## 2017-01-15 NOTE — Progress Notes (Signed)
HISTORY OF PRESENT ILLNESS:  [name] House is a 71 y.o. male with multiple medical problems who is status post Roux-en-Y gastric bypass surgery 2005. 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 I last saw the patient in the office September 2017. He missed follow-up is recommended. His wife has been suffering with pancreatic cancer. He was recently hospitalized with massive ascites and anasarca with significant scrotal edema. Also chronic renal insufficiency which is slightly worsened. This was mid March. He had seen his nephrologist Dr. Posey Pronto 2 weeks prior. I saw him in the hospital. He had large volume paracentesis. Diuretic therapy decreased. Lactulose continued. Not felt to be a candidate for TIPS. Advised with regards to low sodium diet which she had not been implementing. Has had outpatient blood work which has showed his renal function to be stable. Creatinine last week was 1.61 with GFR 45.3. Current medications include Aldactone 100 mg daily, Lasix 40 mg daily, and lactulose 45 mL twice daily. He is having 2-3 bowel movements daily, typically in the morning. No evidence for GI bleeding. Denies symptoms of encephalopathy. Has been compliant with low-sodium diet (which he does not like). Significant improvement in edema. No pain or other complaints. He does not have a scale at home to weigh himself  REVIEW OF SYSTEMS:  All non-GI ROS negative except for fatigue  Past Medical History:  Diagnosis Date  . Abdominal hernia   . Ascites   . Benign neoplasm of colon   . Cirrhosis ([place])   . Diabetes mellitus, type 2 ([place])    resolved with gastric bypass  . Diverticulosis of colon (without mention of hemorrhage)   . Esophageal varices ([place])   . Hyperlipemia   . Hypertension   . Iron deficiency anemia secondary to blood loss (chronic)   . Kidney stones   . Liver cyst   . Lymphopenia   . NASH  (nonalcoholic steatohepatitis)   . Osteoarthritis of hip   . Renal cyst   . Splenomegaly   . Unspecified sleep apnea    Resolved    Past Surgical History:  Procedure Laterality Date  . ANKLE SURGERY     Bil  . COLONOSCOPY  2012  . GASTRIC BYPASS  2005  . KNEE SURGERY     Bil  . POLYPECTOMY    . TOTAL HIP ARTHROPLASTY  2008   right    Social History BRYANT SAYE  reports that he has never smoked. He has never used smokeless tobacco. He reports that he does not drink alcohol or use drugs.  family history includes Coronary artery disease in his brother; Diabetes in his mother; Heart attack in his father; Heart disease in his father.  Allergies  Allergen Reactions  . Oxycodone Other (See Comments)    Reaction:  Caused pt to fall        PHYSICAL EXAMINATION: Vital signs: BP 120/60 (BP Location: Left Arm, Patient Position: Sitting, Cuff Size: Normal)   Pulse 68   Ht 5' 11.5" (1.816 m)   Wt 196 lb 4 oz (89 kg)   BMI 26.99 kg/m   Constitutional: Chronically ill-appearing, no acute distress. Obvious hepatic fetor Psychiatric: alert and oriented x3, cooperative Head: Bilateral temporal wasting Eyes: extraocular movements intact, anicteric, conjunctiva pink Mouth: oral pharynx moist, no lesions Neck: supple no lymphadenopathy Cardiovascular: heart regular rate and rhythm, no murmur Lungs: clear to auscultation bilaterally Abdomen: soft, obese, nontender, nondistended,  is to have non-tense ascites, no peritoneal signs, normal bowel sounds, no organomegaly. Large midline ventral hernia unchanged Rectal: Omitted Extremities: no clubbing or cyanosis. 1+ lower extremity edema bilaterally above his socks Skin: Dry skin. Otherwise no lesions on visible extremities Neuro: No focal deficits. No asterixis.   ASSESSMENT:  #1. NASH cirrhosis with portal hypertension #2. History of ascites and anasarca secondary to cirrhosis #3. History of hepatic encephalopathy secondary to  cirrhosis #4. Chronic renal insufficiency #5. Ventral hernia #6. Status post Roux-en-Y gastric bypass surgery 2005 #7. History of adenomatous colon polyps #8. Multiple general medical problems under the care of Dr. Philip Aspen   PLAN:  #1. Continue Lasix and Aldactone at current dose #2. Continue lactulose #3. Refill all of the above medications #4. Basic metabolic panel today #5. Continue 2 g sodium diet #6. Routine GI office follow-up 2 months. Contact the office in the interim for questions or problems  ADDENDUM Basic metabolic panel has returned. Stable. Repeat in 2 weeks. Patient notified

## 2017-01-16 ENCOUNTER — Other Ambulatory Visit: Payer: PPO

## 2017-01-23 DIAGNOSIS — R188 Other ascites: Secondary | ICD-10-CM | POA: Diagnosis not present

## 2017-01-23 DIAGNOSIS — G4719 Other hypersomnia: Secondary | ICD-10-CM | POA: Diagnosis not present

## 2017-01-23 DIAGNOSIS — Z6825 Body mass index (BMI) 25.0-25.9, adult: Secondary | ICD-10-CM | POA: Diagnosis not present

## 2017-01-23 DIAGNOSIS — D696 Thrombocytopenia, unspecified: Secondary | ICD-10-CM | POA: Diagnosis not present

## 2017-01-23 DIAGNOSIS — I2510 Atherosclerotic heart disease of native coronary artery without angina pectoris: Secondary | ICD-10-CM | POA: Diagnosis not present

## 2017-01-23 DIAGNOSIS — G4733 Obstructive sleep apnea (adult) (pediatric): Secondary | ICD-10-CM | POA: Diagnosis not present

## 2017-01-23 DIAGNOSIS — R634 Abnormal weight loss: Secondary | ICD-10-CM | POA: Diagnosis not present

## 2017-01-23 DIAGNOSIS — E1151 Type 2 diabetes mellitus with diabetic peripheral angiopathy without gangrene: Secondary | ICD-10-CM | POA: Diagnosis not present

## 2017-01-23 DIAGNOSIS — K7460 Unspecified cirrhosis of liver: Secondary | ICD-10-CM | POA: Diagnosis not present

## 2017-01-23 DIAGNOSIS — F3289 Other specified depressive episodes: Secondary | ICD-10-CM | POA: Diagnosis not present

## 2017-01-23 DIAGNOSIS — I7389 Other specified peripheral vascular diseases: Secondary | ICD-10-CM | POA: Diagnosis not present

## 2017-02-03 ENCOUNTER — Other Ambulatory Visit: Payer: Self-pay

## 2017-02-03 ENCOUNTER — Other Ambulatory Visit (INDEPENDENT_AMBULATORY_CARE_PROVIDER_SITE_OTHER): Payer: PPO

## 2017-02-03 DIAGNOSIS — K7581 Nonalcoholic steatohepatitis (NASH): Secondary | ICD-10-CM

## 2017-02-03 DIAGNOSIS — K7469 Other cirrhosis of liver: Secondary | ICD-10-CM

## 2017-02-03 LAB — BASIC METABOLIC PANEL
BUN: 30 mg/dL — ABNORMAL HIGH (ref 6–23)
CALCIUM: 8.4 mg/dL (ref 8.4–10.5)
CO2: 20 meq/L (ref 19–32)
CREATININE: 1.86 mg/dL — AB (ref 0.40–1.50)
Chloride: 110 mEq/L (ref 96–112)
GFR: 38.35 mL/min — ABNORMAL LOW (ref >60.00)
Glucose, Bld: 201 mg/dL — ABNORMAL HIGH (ref 70–99)
Potassium: 3.9 mEq/L (ref 3.5–5.1)
SODIUM: 137 meq/L (ref 135–145)

## 2017-02-05 ENCOUNTER — Other Ambulatory Visit: Payer: Self-pay

## 2017-02-05 DIAGNOSIS — R7989 Other specified abnormal findings of blood chemistry: Secondary | ICD-10-CM

## 2017-02-11 ENCOUNTER — Emergency Department (HOSPITAL_COMMUNITY): Payer: PPO

## 2017-02-11 ENCOUNTER — Inpatient Hospital Stay (HOSPITAL_COMMUNITY)
Admission: EM | Admit: 2017-02-11 | Discharge: 2017-02-14 | DRG: 442 | Disposition: A | Discharge status: Skilled Nursing Facility | Payer: PPO | Attending: Internal Medicine | Admitting: Internal Medicine

## 2017-02-11 ENCOUNTER — Encounter (HOSPITAL_COMMUNITY): Payer: Self-pay

## 2017-02-11 DIAGNOSIS — E1122 Type 2 diabetes mellitus with diabetic chronic kidney disease: Secondary | ICD-10-CM | POA: Diagnosis present

## 2017-02-11 DIAGNOSIS — E785 Hyperlipidemia, unspecified: Secondary | ICD-10-CM | POA: Diagnosis not present

## 2017-02-11 DIAGNOSIS — B955 Unspecified streptococcus as the cause of diseases classified elsewhere: Secondary | ICD-10-CM | POA: Diagnosis not present

## 2017-02-11 DIAGNOSIS — L8991 Pressure ulcer of unspecified site, stage 1: Secondary | ICD-10-CM | POA: Diagnosis present

## 2017-02-11 DIAGNOSIS — D509 Iron deficiency anemia, unspecified: Secondary | ICD-10-CM | POA: Diagnosis present

## 2017-02-11 DIAGNOSIS — K5730 Diverticulosis of large intestine without perforation or abscess without bleeding: Secondary | ICD-10-CM | POA: Diagnosis present

## 2017-02-11 DIAGNOSIS — K7290 Hepatic failure, unspecified without coma: Principal | ICD-10-CM | POA: Diagnosis present

## 2017-02-11 DIAGNOSIS — D61818 Other pancytopenia: Secondary | ICD-10-CM | POA: Diagnosis not present

## 2017-02-11 DIAGNOSIS — K7469 Other cirrhosis of liver: Secondary | ICD-10-CM | POA: Diagnosis present

## 2017-02-11 DIAGNOSIS — K7682 Hepatic encephalopathy: Secondary | ICD-10-CM | POA: Diagnosis present

## 2017-02-11 DIAGNOSIS — M6281 Muscle weakness (generalized): Secondary | ICD-10-CM | POA: Diagnosis not present

## 2017-02-11 DIAGNOSIS — M1610 Unilateral primary osteoarthritis, unspecified hip: Secondary | ICD-10-CM | POA: Diagnosis not present

## 2017-02-11 DIAGNOSIS — E119 Type 2 diabetes mellitus without complications: Secondary | ICD-10-CM | POA: Diagnosis not present

## 2017-02-11 DIAGNOSIS — N390 Urinary tract infection, site not specified: Secondary | ICD-10-CM | POA: Diagnosis not present

## 2017-02-11 DIAGNOSIS — L8990 Pressure ulcer of unspecified site, unspecified stage: Secondary | ICD-10-CM | POA: Insufficient documentation

## 2017-02-11 DIAGNOSIS — K7581 Nonalcoholic steatohepatitis (NASH): Secondary | ICD-10-CM

## 2017-02-11 DIAGNOSIS — Z79899 Other long term (current) drug therapy: Secondary | ICD-10-CM | POA: Diagnosis not present

## 2017-02-11 DIAGNOSIS — I10 Essential (primary) hypertension: Secondary | ICD-10-CM | POA: Diagnosis not present

## 2017-02-11 DIAGNOSIS — D696 Thrombocytopenia, unspecified: Secondary | ICD-10-CM | POA: Diagnosis not present

## 2017-02-11 DIAGNOSIS — D72819 Decreased white blood cell count, unspecified: Secondary | ICD-10-CM | POA: Diagnosis present

## 2017-02-11 DIAGNOSIS — Z9884 Bariatric surgery status: Secondary | ICD-10-CM | POA: Diagnosis not present

## 2017-02-11 DIAGNOSIS — Z885 Allergy status to narcotic agent status: Secondary | ICD-10-CM | POA: Diagnosis not present

## 2017-02-11 DIAGNOSIS — R410 Disorientation, unspecified: Secondary | ICD-10-CM | POA: Diagnosis not present

## 2017-02-11 DIAGNOSIS — I129 Hypertensive chronic kidney disease with stage 1 through stage 4 chronic kidney disease, or unspecified chronic kidney disease: Secondary | ICD-10-CM | POA: Diagnosis present

## 2017-02-11 DIAGNOSIS — N184 Chronic kidney disease, stage 4 (severe): Secondary | ICD-10-CM | POA: Diagnosis present

## 2017-02-11 DIAGNOSIS — R739 Hyperglycemia, unspecified: Secondary | ICD-10-CM | POA: Diagnosis not present

## 2017-02-11 DIAGNOSIS — Z87442 Personal history of urinary calculi: Secondary | ICD-10-CM | POA: Diagnosis not present

## 2017-02-11 DIAGNOSIS — E872 Acidosis, unspecified: Secondary | ICD-10-CM | POA: Diagnosis present

## 2017-02-11 DIAGNOSIS — I2510 Atherosclerotic heart disease of native coronary artery without angina pectoris: Secondary | ICD-10-CM | POA: Diagnosis present

## 2017-02-11 DIAGNOSIS — R601 Generalized edema: Secondary | ICD-10-CM | POA: Diagnosis not present

## 2017-02-11 DIAGNOSIS — K766 Portal hypertension: Secondary | ICD-10-CM | POA: Diagnosis not present

## 2017-02-11 DIAGNOSIS — K7460 Unspecified cirrhosis of liver: Secondary | ICD-10-CM | POA: Diagnosis not present

## 2017-02-11 DIAGNOSIS — R262 Difficulty in walking, not elsewhere classified: Secondary | ICD-10-CM | POA: Diagnosis not present

## 2017-02-11 DIAGNOSIS — R4182 Altered mental status, unspecified: Secondary | ICD-10-CM | POA: Diagnosis not present

## 2017-02-11 DIAGNOSIS — D126 Benign neoplasm of colon, unspecified: Secondary | ICD-10-CM | POA: Diagnosis not present

## 2017-02-11 DIAGNOSIS — R41841 Cognitive communication deficit: Secondary | ICD-10-CM | POA: Diagnosis not present

## 2017-02-11 DIAGNOSIS — M159 Polyosteoarthritis, unspecified: Secondary | ICD-10-CM | POA: Diagnosis not present

## 2017-02-11 DIAGNOSIS — Z886 Allergy status to analgesic agent status: Secondary | ICD-10-CM

## 2017-02-11 DIAGNOSIS — R188 Other ascites: Secondary | ICD-10-CM | POA: Diagnosis present

## 2017-02-11 DIAGNOSIS — R8271 Bacteriuria: Secondary | ICD-10-CM | POA: Diagnosis not present

## 2017-02-11 DIAGNOSIS — Z96641 Presence of right artificial hip joint: Secondary | ICD-10-CM | POA: Diagnosis present

## 2017-02-11 DIAGNOSIS — R278 Other lack of coordination: Secondary | ICD-10-CM | POA: Diagnosis not present

## 2017-02-11 LAB — CBC
HCT: 32.7 % — ABNORMAL LOW (ref 39.0–52.0)
Hemoglobin: 11.2 g/dL — ABNORMAL LOW (ref 13.0–17.0)
MCH: 29.9 pg (ref 26.0–34.0)
MCHC: 34.3 g/dL (ref 30.0–36.0)
MCV: 87.4 fL (ref 78.0–100.0)
PLATELETS: 44 10*3/uL — AB (ref 150–400)
RBC: 3.74 MIL/uL — ABNORMAL LOW (ref 4.22–5.81)
RDW: 14.3 % (ref 11.5–15.5)
WBC: 2.8 10*3/uL — AB (ref 4.0–10.5)

## 2017-02-11 LAB — COMPREHENSIVE METABOLIC PANEL
ALK PHOS: 128 U/L — AB (ref 38–126)
ALT: 27 U/L (ref 17–63)
AST: 41 U/L (ref 15–41)
Albumin: 3.3 g/dL — ABNORMAL LOW (ref 3.5–5.0)
Anion gap: 10 (ref 5–15)
BUN: 25 mg/dL — AB (ref 6–20)
CALCIUM: 8.4 mg/dL — AB (ref 8.9–10.3)
CO2: 17 mmol/L — ABNORMAL LOW (ref 22–32)
CREATININE: 1.53 mg/dL — AB (ref 0.61–1.24)
Chloride: 112 mmol/L — ABNORMAL HIGH (ref 101–111)
GFR, EST AFRICAN AMERICAN: 51 mL/min — AB (ref >60)
GFR, EST NON AFRICAN AMERICAN: 44 mL/min — AB (ref >60)
Glucose, Bld: 216 mg/dL — ABNORMAL HIGH (ref 65–99)
Potassium: 4.1 mmol/L (ref 3.5–5.1)
Sodium: 139 mmol/L (ref 135–145)
Total Bilirubin: 1.4 mg/dL — ABNORMAL HIGH (ref 0.3–1.2)
Total Protein: 5.7 g/dL — ABNORMAL LOW (ref 6.5–8.1)

## 2017-02-11 LAB — URINALYSIS, ROUTINE W REFLEX MICROSCOPIC
BILIRUBIN URINE: NEGATIVE
GLUCOSE, UA: NEGATIVE mg/dL
Hgb urine dipstick: NEGATIVE
Ketones, ur: NEGATIVE mg/dL
Leukocytes, UA: NEGATIVE
Nitrite: NEGATIVE
PROTEIN: NEGATIVE mg/dL
SPECIFIC GRAVITY, URINE: 1.014 (ref 1.005–1.030)
pH: 5 (ref 5.0–8.0)

## 2017-02-11 LAB — CBG MONITORING, ED: GLUCOSE-CAPILLARY: 205 mg/dL — AB (ref 65–99)

## 2017-02-11 LAB — AMMONIA: Ammonia: 125 umol/L — ABNORMAL HIGH (ref 9–35)

## 2017-02-11 MED ORDER — SODIUM CHLORIDE 0.9 % IV BOLUS (SEPSIS)
500.0000 mL | Freq: Once | INTRAVENOUS | Status: AC
  Administered 2017-02-11: 500 mL via INTRAVENOUS

## 2017-02-11 MED ORDER — POTASSIUM CHLORIDE CRYS ER 20 MEQ PO TBCR
40.0000 meq | EXTENDED_RELEASE_TABLET | Freq: Once | ORAL | Status: AC
  Administered 2017-02-11: 40 meq via ORAL
  Filled 2017-02-11: qty 2

## 2017-02-11 MED ORDER — LACTULOSE 10 GM/15ML PO SOLN
20.0000 g | Freq: Once | ORAL | Status: AC
  Administered 2017-02-11: 20 g via ORAL
  Filled 2017-02-11: qty 30

## 2017-02-11 NOTE — ED Triage Notes (Signed)
Patient's wife had a friend to bring the patient to the ED for Altered mental status. Patient's wife wrote a note stating that the patient will not groom himself. Patient denies feeling tired. Patient states he gets lab work done every 2 weeks. Friend that brought the patient states he feels that the patient is confused. Friend states the patient is normally active and takes of himself.

## 2017-02-11 NOTE — ED Provider Notes (Signed)
[facility] DEPT Provider Note   CSN: [id number] Arrival date & time: 02/11/17  1733     History   Chief Complaint Chief Complaint  Patient presents with  . Altered Mental Status    HPI AUDRY PECINA is a 71 y.o. male.  71 year old male presents with possible altered mental status from home. According to patient and also her friend, his wife was concerned that patient has not been able to do his ADLs. Patient himself denies any fever, chills, severe headaches. No cough or congestion. No urinary symptoms. Patient's friend states that he appears to be slightly more confused than normal with regards to answering questions. No recent changes to his medications. Nothing seems to make his symptoms better or worse. No treatment use prior to arrival.      Past Medical History:  Diagnosis Date  . Abdominal hernia   . Ascites   . Benign neoplasm of colon   . Cirrhosis ([place])   . Diabetes mellitus, type 2 ([place])    resolved with gastric bypass  . Diverticulosis of colon (without mention of hemorrhage)   . Esophageal varices ([place])   . Hyperlipemia   . Hypertension   . Iron deficiency anemia secondary to blood loss (chronic)   . Kidney stones   . Liver cyst   . Lymphopenia   . NASH (nonalcoholic steatohepatitis)   . Osteoarthritis of hip   . Renal cyst   . Splenomegaly   . Unspecified sleep apnea    Resolved    Patient Active Problem List   Diagnosis Date Noted  . Anasarca   . Renal insufficiency   . Encephalopathy, hepatic ([place])   . Ascites 12/20/2016  . Liver lesion   . Acute renal failure superimposed on stage 4 chronic kidney disease ([place]) 11/17/2016  . Bacteremia 11/17/2016  . Klebsiella infection 11/17/2016  . Metabolic acidosis, normal anion gap (NAG) 11/13/2016  . Liver cirrhosis secondary to NASH ([place]) 11/13/2016  . CKD (chronic kidney disease) stage 4, GFR 15-29 ml/min (HCC) 11/13/2016  . Fever 02/14/2015  . Generalized weakness 02/14/2015  . Lower urinary  tract infectious disease 02/14/2015  . Hyperglycemia 02/14/2015  . Diabetes mellitus type 2, controlled ([place]) 02/14/2015  . Thrombocytopenia ([place]) 02/14/2015  . Weakness 02/14/2015  . Urinary tract infectious disease   . Coronary artery disease 06/07/2011  . S/P gastric bypass 06/07/2011  . COLONIC POLYPS 11/20/2007  . DIABETES MELLITUS, TYPE II, CONTROLLED 11/20/2007  . DYSLIPIDEMIA 11/20/2007  . Iron deficiency anemia 11/20/2007  . Essential hypertension 11/20/2007  . INTERNAL HEMORRHOIDS 11/20/2007  . DIVERTICULOSIS, COLON 11/20/2007  . GENERALIZED OSTEOARTHROSIS UNSPECIFIED SITE 11/20/2007  . Sleep apnea 11/20/2007  . RENAL CALCULUS, HX OF 11/20/2007    Past Surgical History:  Procedure Laterality Date  . ANKLE SURGERY     Bil  . COLONOSCOPY  2012  . GASTRIC BYPASS  2005  . KNEE SURGERY     Bil  . POLYPECTOMY    . TOTAL HIP ARTHROPLASTY  2008   right       Home Medications    Prior to Admission medications   Medication Sig Start Date End Date Taking? Authorizing Provider  diphenoxylate-atropine (LOMOTIL) 2.5-0.025 MG tablet Take 1 tablet by mouth 4 (four) times daily as needed for diarrhea or loose stools.   Yes [provider]  furosemide (LASIX) 20 MG tablet Take 1 tablet (20 mg total) by mouth 2 (two) times daily. 01/15/17  Yes Irene Shipper, MD  lactulose [facility])  10 GM/15ML solution Take 45 mLs (30 g total) by mouth 2 (two) times daily. 12/24/16  Yes Elgergawy, Silver Huguenin, MD  sodium bicarbonate 650 MG tablet Take 650 mg by mouth 2 (two) times daily.   Yes [provider]  spironolactone (ALDACTONE) 100 MG tablet Take 1 tablet (100 mg total) by mouth daily. 12/25/16  Yes Elgergawy, Silver Huguenin, MD    Family History Family History  Problem Relation Age of Onset  . Heart attack Father     deceased due to MI  . Heart disease Father   . Diabetes Mother     deceased due to complications of DM  . Coronary artery disease Brother   . Colon cancer  Neg Hx   . Stomach cancer Neg Hx   . Liver disease Neg Hx     Social History Social History  Substance Use Topics  . Smoking status: Never Smoker  . Smokeless tobacco: Never Used  . Alcohol use No     Comment: none     Allergies   Oxycodone   Review of Systems Review of Systems  All other systems reviewed and are negative.    Physical Exam Updated Vital Signs BP 134/65 (BP Location: Right Arm)   Pulse 70   Temp 98.3 F (36.8 C) (Oral)   Resp 17   Ht 6' 0.25" (1.835 m)   Wt 90.7 kg   SpO2 99%   BMI 26.94 kg/m   Physical Exam  Constitutional: He is oriented to person, place, and time. He appears well-developed and well-nourished.  Non-toxic appearance. No distress.  HENT:  Head: Normocephalic and atraumatic.  Eyes: Conjunctivae, EOM and lids are normal. Pupils are equal, round, and reactive to light.  Neck: Normal range of motion. Neck supple. No tracheal deviation present. No thyroid mass present.  Cardiovascular: Normal rate, regular rhythm and normal heart sounds.  Exam reveals no gallop.   No murmur heard. Pulmonary/Chest: Effort normal and breath sounds normal. No stridor. No respiratory distress. He has no decreased breath sounds. He has no wheezes. He has no rhonchi. He has no rales.  Abdominal: Soft. Normal appearance and bowel sounds are normal. He exhibits no distension. There is no tenderness. There is no rebound and no CVA tenderness.  Musculoskeletal: Normal range of motion. He exhibits no edema or tenderness.  Neurological: He is alert and oriented to person, place, and time. He has normal strength. He displays no tremor. No cranial nerve deficit or sensory deficit. He displays a negative Romberg sign. GCS eye subscore is 4. GCS verbal subscore is 5. GCS motor subscore is 6.  Skin: Skin is warm and dry. No abrasion and no rash noted.  Psychiatric: His speech is normal. His affect is blunt. He is slowed.  Nursing note and vitals reviewed.    ED  Treatments / Results  Labs (all labs ordered are listed, but only abnormal results are displayed) Labs Reviewed  COMPREHENSIVE METABOLIC PANEL - Abnormal; Notable for the following:       Result Value   Chloride 112 (*)    CO2 17 (*)    Glucose, Bld 216 (*)    BUN 25 (*)    Creatinine, Ser 1.53 (*)    Calcium 8.4 (*)    Total Protein 5.7 (*)    Albumin 3.3 (*)    Alkaline Phosphatase 128 (*)    Total Bilirubin 1.4 (*)    GFR calc non Af Amer 44 (*)    GFR calc  Af Amer 51 (*)    All other components within normal limits  CBC - Abnormal; Notable for the following:    WBC 2.8 (*)    RBC 3.74 (*)    Hemoglobin 11.2 (*)    HCT 32.7 (*)    Platelets 44 (*)    All other components within normal limits  CBG MONITORING, ED - Abnormal; Notable for the following:    Glucose-Capillary 205 (*)    All other components within normal limits  URINE CULTURE  URINALYSIS, ROUTINE W REFLEX MICROSCOPIC  AMMONIA    EKG  EKG Interpretation None       Radiology No results found.  Procedures Procedures (including critical care time)  Medications Ordered in ED Medications - No data to display   Initial Impression / Assessment and Plan / ED Course  I have reviewed the triage vital signs and the nursing notes.  Pertinent labs & imaging results that were available during my care of the patient were reviewed by me and considered in my medical decision making (see chart for details).     Patient has evidence of hepatic encephalopathy. Given lactulose. He also has evidence of mild hypokalemia will be given oral potassium. Head CT without acute findings. Will be admitted to the hospitalist service  Final Clinical Impressions(s) / ED Diagnoses   Final diagnoses:  None    New Prescriptions New Prescriptions   No medications on file     Lacretia Leigh, MD 02/11/17 2235

## 2017-02-12 ENCOUNTER — Encounter (HOSPITAL_COMMUNITY): Payer: Self-pay | Admitting: Family Medicine

## 2017-02-12 DIAGNOSIS — L8990 Pressure ulcer of unspecified site, unspecified stage: Secondary | ICD-10-CM | POA: Insufficient documentation

## 2017-02-12 LAB — COMPREHENSIVE METABOLIC PANEL
ALBUMIN: 3 g/dL — AB (ref 3.5–5.0)
ALK PHOS: 112 U/L (ref 38–126)
ALT: 27 U/L (ref 17–63)
AST: 32 U/L (ref 15–41)
Anion gap: 6 (ref 5–15)
BUN: 24 mg/dL — ABNORMAL HIGH (ref 6–20)
CALCIUM: 8.2 mg/dL — AB (ref 8.9–10.3)
CO2: 19 mmol/L — AB (ref 22–32)
CREATININE: 1.46 mg/dL — AB (ref 0.61–1.24)
Chloride: 115 mmol/L — ABNORMAL HIGH (ref 101–111)
GFR calc Af Amer: 54 mL/min — ABNORMAL LOW (ref >60)
GFR calc non Af Amer: 47 mL/min — ABNORMAL LOW (ref >60)
Glucose, Bld: 177 mg/dL — ABNORMAL HIGH (ref 65–99)
Potassium: 3.8 mmol/L (ref 3.5–5.1)
SODIUM: 140 mmol/L (ref 135–145)
Total Bilirubin: 1.4 mg/dL — ABNORMAL HIGH (ref 0.3–1.2)
Total Protein: 5.2 g/dL — ABNORMAL LOW (ref 6.5–8.1)

## 2017-02-12 LAB — CBC
HCT: 27.7 % — ABNORMAL LOW (ref 39.0–52.0)
Hemoglobin: 9.5 g/dL — ABNORMAL LOW (ref 13.0–17.0)
MCH: 29.8 pg (ref 26.0–34.0)
MCHC: 34.3 g/dL (ref 30.0–36.0)
MCV: 86.8 fL (ref 78.0–100.0)
Platelets: 39 10*3/uL — ABNORMAL LOW (ref 150–400)
RBC: 3.19 MIL/uL — ABNORMAL LOW (ref 4.22–5.81)
RDW: 14.4 % (ref 11.5–15.5)
WBC: 2.2 10*3/uL — ABNORMAL LOW (ref 4.0–10.5)

## 2017-02-12 LAB — AMMONIA: Ammonia: 46 umol/L — ABNORMAL HIGH (ref 9–35)

## 2017-02-12 MED ORDER — LACTULOSE 10 GM/15ML PO SOLN
30.0000 g | Freq: Two times a day (BID) | ORAL | Status: DC
  Administered 2017-02-12 – 2017-02-14 (×6): 30 g via ORAL
  Filled 2017-02-12 (×6): qty 60

## 2017-02-12 MED ORDER — SODIUM BICARBONATE 650 MG PO TABS
650.0000 mg | ORAL_TABLET | Freq: Two times a day (BID) | ORAL | Status: DC
  Administered 2017-02-12 – 2017-02-14 (×6): 650 mg via ORAL
  Filled 2017-02-12 (×6): qty 1

## 2017-02-12 MED ORDER — RIFAXIMIN 550 MG PO TABS
550.0000 mg | ORAL_TABLET | Freq: Two times a day (BID) | ORAL | Status: DC
  Administered 2017-02-12 – 2017-02-14 (×5): 550 mg via ORAL
  Filled 2017-02-12 (×4): qty 1

## 2017-02-12 MED ORDER — SODIUM CHLORIDE 0.9 % IV SOLN
INTRAVENOUS | Status: AC
  Administered 2017-02-12: 03:00:00 via INTRAVENOUS

## 2017-02-12 MED ORDER — ONDANSETRON HCL 4 MG/2ML IJ SOLN: 4.0000 mg | Freq: Four times a day (QID) | INTRAMUSCULAR | Status: DC | PRN

## 2017-02-12 MED ORDER — ONDANSETRON HCL 4 MG PO TABS: 4.0000 mg | ORAL_TABLET | Freq: Four times a day (QID) | ORAL | Status: DC | PRN

## 2017-02-12 NOTE — Progress Notes (Signed)
PROGRESS NOTE    [name] House  KGY:[id number] DOB: [birth year] DOA: 02/11/2017 PCP: Leanna Battles, MD    Brief Narrative:  71 y.o. male with a past medical history significant for NASH cirrhosis, history of Roux-and-Y gastric bypass, HTN, and varices and HE who presents with confusion over the past week.  The patient was in his usual state of health until about 3-4 days ago when he started to notice increasing confusion, psychomotor slowing, feeling like he can't remember things, feeling generalized malaise and fatigue.  He has had no abdominal pain.  He has had no fever, chills, cough, dysuria, urinary irritation.  He has been taking his diuretics and lactulose as prescribed and having 3-4 BMs per day.  He has been having no increase in abdominal girth or leg swelling.  Family tell me outside the room, that they have noticed increased psychomotor slowing over about the last month or more and that he appears to be having a hard time with his ADLs at home.  Assessment & Plan:   Principal Problem:   Hepatic encephalopathy (HCC) Active Problems:   Iron deficiency anemia   Essential hypertension   Diabetes mellitus type 2, controlled (HCC)   Thrombocytopenia (HCC)   Metabolic acidosis, normal anion gap (NAG)   Liver cirrhosis secondary to NASH (HCC)   CKD (chronic kidney disease) stage 4, GFR 15-29 ml/min (HCC)   Pressure injury of skin  1. Hepatic encephalopathy:  - Presenting ammonia level elevated at 125 - Do not feel there is enough ascites for SBP, nor pain. Claims adherence to meds.     -Lactulose has been continued and scheduled -Repeat ammonia improved to 46 this AM - Repeat LFT's and ammonia in AM  2. Acidosis:  - Stable at present, chronic. -Continue Bicarb as tolerated  3. Chronic kidney disease:  -Stable at this time  4. Pancytopenia:  -From chronic disease, iron deficiency, portal hypertension.  -Repeat CBC in AM  5. NASH cirrhosis:  -Continue fursoemide  and spironolactone as tolerated  6. Leukopenia:  -Pt without evidence of infection. -Culture for fever  DVT prophylaxis: SCD's Code Status: Full Family Communication: Pt in room, family not at bedside Disposition Plan: SNF, timing uncertain  Consultants:     Procedures:     Antimicrobials: Anti-infectives    None       Subjective: Feels better today  Objective: Vitals:   02/12/17 0030 02/12/17 0100 02/12/17 0139 02/12/17 0458  BP: 139/70 (!) 141/71 129/65 127/65  Pulse: 73 74 69 97  Resp: (!) 0 (!) 7 20 18   Temp:   98.5 F (36.9 C) 97.9 F (36.6 C)  TempSrc:   Oral Oral  SpO2: 99% 100% 100% 98%  Weight:   91.1 kg (200 lb 13.4 oz)   Height:   5\' 11"  (1.803 m)     Intake/Output Summary (Last 24 hours) at 02/12/17 1557 Last data filed at 02/12/17 1021  Gross per 24 hour  Intake           681.25 ml  Output               25 ml  Net           656.25 ml   Filed Weights   02/11/17 1827 02/12/17 0139  Weight: 90.7 kg (200 lb) 91.1 kg (200 lb 13.4 oz)    Examination:  General exam: Appears calm and comfortable  Respiratory system: Clear to auscultation. Respiratory effort normal. Cardiovascular system: S1 & S2 heard,  RRR. Gastrointestinal system: Abdomen is nondistended, soft and nontender. No organomegaly or masses felt. Normal bowel sounds heard. Central nervous system: Alert and oriented. No focal neurological deficits. Extremities: Symmetric 5 x 5 power. Skin: No rashes, lesions Psychiatry: Judgement and insight appear normal. Mood & affect appropriate.   Data Reviewed: I have personally reviewed following labs and imaging studies  CBC:  Recent Labs Lab 02/11/17 2005 02/12/17 0521  WBC 2.8* 2.2*  HGB 11.2* 9.5*  HCT 32.7* 27.7*  MCV 87.4 86.8  PLT 44* 39*   Basic Metabolic Panel:  Recent Labs Lab 02/11/17 1845 02/12/17 0521  NA 139 140  K 4.1 3.8  CL 112* 115*  CO2 17* 19*  GLUCOSE 216* 177*  BUN 25* 24*  CREATININE 1.53* 1.46*    CALCIUM 8.4* 8.2*   GFR: Estimated Creatinine Clearance: 54.3 mL/min (A) (by C-G formula based on SCr of 1.46 mg/dL (H)). Liver Function Tests:  Recent Labs Lab 02/11/17 1845 02/12/17 0521  AST 41 32  ALT 27 27  ALKPHOS 128* 112  BILITOT 1.4* 1.4*  PROT 5.7* 5.2*  ALBUMIN 3.3* 3.0*   No results for input(s): LIPASE, AMYLASE in the last 168 hours.  Recent Labs Lab 02/11/17 2118 02/12/17 0521  AMMONIA 125* 46*   Coagulation Profile: No results for input(s): INR, PROTIME in the last 168 hours. Cardiac Enzymes: No results for input(s): CKTOTAL, CKMB, CKMBINDEX, TROPONINI in the last 168 hours. BNP (last 3 results) No results for input(s): PROBNP in the last 8760 hours. HbA1C: No results for input(s): HGBA1C in the last 72 hours. CBG:  Recent Labs Lab 02/11/17 1838  GLUCAP 205*   Lipid Profile: No results for input(s): CHOL, HDL, LDLCALC, TRIG, CHOLHDL, LDLDIRECT in the last 72 hours. Thyroid Function Tests: No results for input(s): TSH, T4TOTAL, FREET4, T3FREE, THYROIDAB in the last 72 hours. Anemia Panel: No results for input(s): VITAMINB12, FOLATE, FERRITIN, TIBC, IRON, RETICCTPCT in the last 72 hours. Sepsis Labs: No results for input(s): PROCALCITON, LATICACIDVEN in the last 168 hours.  No results found for this or any previous visit (from the past 240 hour(s)).   Radiology Studies: Ct Head Wo Contrast  Result Date: 02/11/2017 CLINICAL DATA:  Altered mental status.  Confusion. EXAM: CT HEAD WITHOUT CONTRAST TECHNIQUE: Contiguous axial images were obtained from the base of the skull through the vertex without intravenous contrast. COMPARISON:  None. FINDINGS: Brain: No evidence of acute infarction, hemorrhage, hydrocephalus, extra-axial collection or mass lesion/mass effect. Vascular: Vascular calcifications are present. Skull: Normal. Negative for fracture or focal lesion. Sinuses/Orbits: No acute finding. Other: None. IMPRESSION: No acute intracranial  abnormalities. Electronically Signed   By: Lucienne Capers M.D.   On: 02/11/2017 21:52    Scheduled Meds: . lactulose  30 g Oral BID  . sodium bicarbonate  650 mg Oral BID   Continuous Infusions:   LOS: 1 day   Cory Kitt, Orpah Melter, MD [facility] Pager [phone]  If 7PM-7AM, please contact night-coverage [email or web address] Password [email or web address] 02/12/2017, 3:57 PM

## 2017-02-12 NOTE — Evaluation (Signed)
Physical Therapy Evaluation Patient Details Name: [name] House MRN: [id number] DOB: May 15, 1946 Today's Date: 02/12/2017   History of Present Illness  71 yo male admitted with hepatic encephalopathy, AMS, Hx of DM, NASH cirrhosis, gastric bypass, chronic anemia, fall, OA.   Clinical Impression  On eval, pt required Min assist for mobility. He walked ~120 feet with a RW. Pt presents with general weakness and impaired gait and balance. Wife reports pt has been unable to perform ADLs prior to admission. Wife has recently completed chemotherapy and she is unable to physically assist pt with ADLs/mobility. Discussed d/c plan-pt could benefit from [facility] rehab stay to maximize independence and safety with functional mobility and ADL performance.     Follow Up Recommendations SNF    Equipment Recommendations  None recommended by PT    Recommendations for Other Services       Precautions / Restrictions Precautions Precautions: Fall Restrictions Weight Bearing Restrictions: No      Mobility  Bed Mobility               General bed mobility comments: oob in recliner  Transfers Overall transfer level: Needs assistance Equipment used: Rolling walker (2 wheeled) Transfers: Sit to/from Stand Sit to Stand: Min assist         General transfer comment: Small amount of assist to rise, stabilize. VCS safety, hand placement  Ambulation/Gait Ambulation/Gait assistance: Min assist Ambulation Distance (Feet): 120 Feet Assistive device: Rolling walker (2 wheeled) Gait Pattern/deviations: Step-through pattern;Decreased stride length;Narrow base of support     General Gait Details: Intermittent assist to stabilzie. Fair gait speed. Cues for safety.   Stairs            Wheelchair Mobility    Modified Rankin (Stroke Patients Only)       Balance Overall balance assessment: Needs assistance;History of Falls           Standing balance-Leahy Scale: Poor Standing balance  comment: using RW currently                             Pertinent Vitals/Pain Pain Assessment: No/denies pain    Home Living Family/patient expects to be discharged to:: Private residence Living Arrangements: Spouse/significant other   Type of Home: House Home Access: Stairs to enter   Technical brewer of Steps: 1   Home Equipment: Walker - standard;Cane - single point Additional Comments: wife has pancreatic cancer per patient-she has just recently completed treatment    Prior Function Level of Independence: Independent               Hand Dominance        Extremity/Trunk Assessment   Upper Extremity Assessment Upper Extremity Assessment: Generalized weakness    Lower Extremity Assessment Lower Extremity Assessment: Generalized weakness    Cervical / Trunk Assessment Cervical / Trunk Assessment: Normal  Communication   Communication: No difficulties  Cognition Arousal/Alertness: Awake/alert Behavior During Therapy: WFL for tasks assessed/performed Overall Cognitive Status: Within Functional Limits for tasks assessed                                        General Comments      Exercises     Assessment/Plan    PT Assessment Patient needs continued PT services  PT Problem List Decreased strength;Decreased mobility;Decreased activity tolerance;Decreased balance;Decreased knowledge of use of DME  PT Treatment Interventions DME instruction;Gait training;Therapeutic activities;Therapeutic exercise;Balance training;Functional mobility training;Patient/family education    PT Goals (Current goals can be found in the Care Plan section)  Acute Rehab PT Goals Patient Stated Goal: to regain independence/PLOF PT Goal Formulation: With patient/family Time For Goal Achievement: 02/26/17 Potential to Achieve Goals: Good    Frequency Min 3X/week   Barriers to discharge Decreased caregiver support wife cannot assist with  ADLs or mobility due to her current physical state    Co-evaluation               AM-PAC PT "6 Clicks" Daily Activity  Outcome Measure Difficulty turning over in bed (including adjusting bedclothes, sheets and blankets)?: A Little Difficulty moving from lying on back to sitting on the side of the bed? : A Little Difficulty sitting down on and standing up from a chair with arms (e.g., wheelchair, bedside commode, etc,.)?: A Little Help needed moving to and from a bed to chair (including a wheelchair)?: A Little Help needed walking in hospital room?: A Little Help needed climbing 3-5 steps with a railing? : A Little 6 Click Score: 18    End of Session Equipment Utilized During Treatment: Gait belt Activity Tolerance: Patient tolerated treatment well Patient left: in chair;with call bell/phone within reach;with family/visitor present;with chair alarm set   PT Visit Diagnosis: Muscle weakness (generalized) (M62.81);Difficulty in walking, not elsewhere classified (R26.2)    Time: 2060-1561 PT Time Calculation (min) (ACUTE ONLY): 12 min   Charges:   PT Evaluation $PT Eval Low Complexity: 1 Procedure     PT G Codes:          Weston Anna, MPT Pager: [phone]

## 2017-02-12 NOTE — H&P (Signed)
History and Physical  Patient Name: [name] House     ZHY:[id number]    DOB: 08/12/1946    DOA: 02/11/2017 PCP: Leanna Battles, MD   Patient coming from: Home  Chief Complaint: Malaise, confusion, weakness  HPI: [name] House is a 71 y.o. male with a past medical history significant for NASH cirrhosis, history of Roux-and-Y gastric bypass, HTN, and varices and HE who presents with confusion over the past week.  The patient was in his usual state of health until about 3-4 days ago when he started to notice increasing confusion, psychomotor slowing, feeling like he can't remember things, feeling generalized malaise and fatigue.  He has had no abdominal pain.  He has had no fever, chills, cough, dysuria, urinary irritation.  He has been taking his diuretics and lactulose as prescribed and having 3-4 BMs per day.  He has been having no increase in abdominal girth or leg swelling.  Family tell me outside the room, that they have noticed increased psychomotor slowing over about the last month or more and that he appears to be having a hard time with his ADLs at home.  ED course: -Afebrile, heart rate 75, respirations pulse ox normal, blood pressure 132/64 -Na 139, K 4.1, Cr 1.53 (baseline 1.9), WBC 2.8K, Hgb 11.2 -Urinalysis clear -CT head normal -Ammonia 125 -He was given IV fluids and TRH were asked to evaluate for hepatic encephalopathy     ROS: Review of Systems  Constitutional: Positive for malaise/fatigue.  Neurological: Positive for weakness.  Psychiatric/Behavioral: Positive for memory loss.  All other systems reviewed and are negative.         Past Medical History:  Diagnosis Date  . Abdominal hernia   . Ascites   . Benign neoplasm of colon   . Cirrhosis ([place])   . Diabetes mellitus, type 2 ([place])    resolved with gastric bypass  . Diverticulosis of colon (without mention of hemorrhage)   . Esophageal varices ([place])   . Hyperlipemia   . Hypertension   . Iron deficiency  anemia secondary to blood loss (chronic)   . Kidney stones   . Liver cyst   . Lymphopenia   . NASH (nonalcoholic steatohepatitis)   . Osteoarthritis of hip   . Renal cyst   . Splenomegaly   . Unspecified sleep apnea    Resolved    Past Surgical History:  Procedure Laterality Date  . ANKLE SURGERY     Bil  . COLONOSCOPY  2012  . GASTRIC BYPASS  2005  . KNEE SURGERY     Bil  . POLYPECTOMY    . TOTAL HIP ARTHROPLASTY  2008   right    Social History: Patient lives with his wife.  The patient walks unassisted.  He is retired from [employer] and from [employer].  He is not a smoker.  He and his wife are hoping to move into independent living at [facility] soon.    Allergies  Allergen Reactions  . Oxycodone Other (See Comments)    Reaction:  Caused pt to fall     Family history: family history includes Coronary artery disease in his brother; Diabetes in his mother; Heart attack in his father; Heart disease in his father.  Prior to Admission medications   Medication Sig Start Date End Date Taking? Authorizing Provider  diphenoxylate-atropine (LOMOTIL) 2.5-0.025 MG tablet Take 1 tablet by mouth 4 (four) times daily as needed for diarrhea or loose stools.  Yes [provider]  furosemide (LASIX) 20 MG tablet Take 1 tablet (20 mg total) by mouth 2 (two) times daily. 01/15/17  Yes Irene Shipper, MD  lactulose (CHRONULAC) 10 GM/15ML solution Take 45 mLs (30 g total) by mouth 2 (two) times daily. 12/24/16  Yes Elgergawy, Silver Huguenin, MD  sodium bicarbonate 650 MG tablet Take 650 mg by mouth 2 (two) times daily.   Yes [provider]  spironolactone (ALDACTONE) 100 MG tablet Take 1 tablet (100 mg total) by mouth daily. 12/25/16  Yes Elgergawy, Silver Huguenin, MD       Physical Exam: BP 127/65 (BP Location: Left Arm)   Pulse 97   Temp 97.9 F (36.6 C) (Oral)   Resp 18   Ht 5\' 11"  (1.803 m)   Wt 91.1 kg (200 lb 13.4 oz)   SpO2 98%   BMI 28.01  kg/m  General appearance: Elderly chronically ill appearing adult male, awake but sleepy and in no acute distress.   Eyes: Icteric, conjunctiva pink, lids and lashes normal. PERRL.    ENT: No nasal deformity, discharge, epistaxis.  Hearing normal. OP moist without lesions.   Neck: No neck masses.  Trachea midline.  No thyromegaly/tenderness. Lymph: No cervical or supraclavicular lymphadenopathy. Skin: Warm and dry.  No jaundice.  No suspicious rashes or lesions. Cardiac: RRR, nl S1-S2, no murmurs appreciated.  Capillary refill is brisk.  JVP normal.  No LE edema.  Radial and DP pulses 2+ and symmetric. Respiratory: Normal respiratory rate and rhythm.  CTAB without rales or wheezes. Abdomen: Abdomen soft.  No TTP. Mild ascites, distension, hepatosplenomegaly.   MSK: No deformities or effusions.  No cyanosis or clubbing. Neuro: Cranial nerves normal.  Sensation intact to light touch. Speech is fluent.  Muscle strength globally weak.    Psych: Sensorium intact and responding to questions, but attention diminished, sluggish.   Affect blunted.  Judgment and insight appear impaired.     Labs on Admission:  I have personally reviewed following labs and imaging studies: CBC:  Recent Labs Lab 02/11/17 2005 02/12/17 0521  WBC 2.8* 2.2*  HGB 11.2* 9.5*  HCT 32.7* 27.7*  MCV 87.4 86.8  PLT 44* 39*   Basic Metabolic Panel:  Recent Labs Lab 02/11/17 1845 02/12/17 0521  NA 139 140  K 4.1 3.8  CL 112* 115*  CO2 17* 19*  GLUCOSE 216* 177*  BUN 25* 24*  CREATININE 1.53* 1.46*  CALCIUM 8.4* 8.2*   GFR: Estimated Creatinine Clearance: 54.3 mL/min (A) (by C-G formula based on SCr of 1.46 mg/dL (H)).  Liver Function Tests:  Recent Labs Lab 02/11/17 1845 02/12/17 0521  AST 41 32  ALT 27 27  ALKPHOS 128* 112  BILITOT 1.4* 1.4*  PROT 5.7* 5.2*  ALBUMIN 3.3* 3.0*   No results for input(s): LIPASE, AMYLASE in the last 168 hours.  Recent Labs Lab 02/11/17 2118 02/12/17 0521    AMMONIA 125* 46*   Coagulation Profile: No results for input(s): INR, PROTIME in the last 168 hours. Cardiac Enzymes: No results for input(s): CKTOTAL, CKMB, CKMBINDEX, TROPONINI in the last 168 hours. BNP (last 3 results) No results for input(s): PROBNP in the last 8760 hours. HbA1C: No results for input(s): HGBA1C in the last 72 hours. CBG:  Recent Labs Lab 02/11/17 1838  GLUCAP 205*   Lipid Profile: No results for input(s): CHOL, HDL, LDLCALC, TRIG, CHOLHDL, LDLDIRECT in the last 72 hours. Thyroid Function Tests: No results for input(s): TSH, T4TOTAL, FREET4, T3FREE, THYROIDAB  in the last 72 hours. Anemia Panel: No results for input(s): VITAMINB12, FOLATE, FERRITIN, TIBC, IRON, RETICCTPCT in the last 72 hours. Sepsis Labs:  Invalid input(s): PROCALCITONIN, LACTICIDVEN No results found for this or any previous visit (from the past 240 hour(s)).       Radiological Exams on Admission: Personally reviewed: CT head report Ct Head Wo Contrast  Result Date: 02/11/2017 CLINICAL DATA:  Altered mental status.  Confusion. EXAM: CT HEAD WITHOUT CONTRAST TECHNIQUE: Contiguous axial images were obtained from the base of the skull through the vertex without intravenous contrast. COMPARISON:  None. FINDINGS: Brain: No evidence of acute infarction, hemorrhage, hydrocephalus, extra-axial collection or mass lesion/mass effect. Vascular: Vascular calcifications are present. Skull: Normal. Negative for fracture or focal lesion. Sinuses/Orbits: No acute finding. Other: None. IMPRESSION: No acute intracranial abnormalities. Electronically Signed   By: Lucienne Capers M.D.   On: 02/11/2017 21:52        Assessment/Plan  1. Hepatic encephalopathy:  Unclear precipitant.  Do not feel there is enough ascites for SBP, nor pain. Claims adherence to meds.     -Lactulose again now and then 30 BID -Repeat ammonia  2. Acidosis:  Stable, chronic. -Continue Bicarb  3. Chronic kidney disease:   Stable  4. Pancytopenia:  From chronic disease, iron deficiency, portal hypertension.  New Leukopenia.  5. NASH cirrhosis:  -Continue fursoemide and spironolactone  6. Leukopenia:  No evidence of infection. -Culture for fever          DVT prophylaxis: SCDs  Code Status: FULL  Family Communication: Friends at bedside  Disposition Plan: Anticipate Lactulose and trend Ammonia Consults called: None overnight Admission status: INPATIENT        Medical decision making: Patient seen at 11:40 PM on 02/11/2017.  The patient was discussed with Dr. Zenia Resides.  What exists of the patient's chart was reviewed in depth and summarized above.  Clinical condition: stable.        Edwin Dada [facility] Pager [phone]

## 2017-02-12 NOTE — NC FL2 (Signed)
[place] LEVEL OF CARE SCREENING TOOL     IDENTIFICATION  Patient Name: [name] House Birthdate: 1946/08/31 Sex: male Admission Date (Current Location): 02/11/2017  [facility] and [place] Number:  Herbalist and Address:  [facility],  [address], [place]      Provider Number: [id number]  Attending Physician Name and Address:  Donne Hazel, MD  Relative Name and Phone Number:       Current Level of Care: Hospital Recommended Level of Care: [place] Prior Approval Number:    Date Approved/Denied:   PASRR Number: [id number]  Discharge Plan: SNF    Current Diagnoses: Patient Active Problem List   Diagnosis Date Noted  . Pressure injury of skin 02/12/2017  . Hepatic encephalopathy ([place]) 02/11/2017  . Anasarca   . Renal insufficiency   . Encephalopathy, hepatic ([place])   . Ascites 12/20/2016  . Liver lesion   . Acute renal failure superimposed on stage 4 chronic kidney disease ([place]) 11/17/2016  . Bacteremia 11/17/2016  . Klebsiella infection 11/17/2016  . Metabolic acidosis, normal anion gap (NAG) 11/13/2016  . Liver cirrhosis secondary to NASH ([place]) 11/13/2016  . CKD (chronic kidney disease) stage 4, GFR 15-29 ml/min (HCC) 11/13/2016  . Fever 02/14/2015  . Generalized weakness 02/14/2015  . Lower urinary tract infectious disease 02/14/2015  . Hyperglycemia 02/14/2015  . Diabetes mellitus type 2, controlled ([place]) 02/14/2015  . Thrombocytopenia ([place]) 02/14/2015  . Weakness 02/14/2015  . Urinary tract infectious disease   . Coronary artery disease 06/07/2011  . S/P gastric bypass 06/07/2011  . COLONIC POLYPS 11/20/2007  . DIABETES MELLITUS, TYPE II, CONTROLLED 11/20/2007  . DYSLIPIDEMIA 11/20/2007  . Iron deficiency anemia 11/20/2007  . Essential hypertension 11/20/2007  . INTERNAL HEMORRHOIDS 11/20/2007  . DIVERTICULOSIS, COLON 11/20/2007  . GENERALIZED OSTEOARTHROSIS UNSPECIFIED SITE 11/20/2007   . Sleep apnea 11/20/2007  . RENAL CALCULUS, HX OF 11/20/2007    Orientation RESPIRATION BLADDER Height & Weight     Self, Time, Situation, Place  Normal Incontinent Weight: 200 lb 13.4 oz (91.1 kg) Height:  5\' 11"  (180.3 cm)  BEHAVIORAL SYMPTOMS/MOOD NEUROLOGICAL BOWEL NUTRITION STATUS      Continent Diet (2 gram sodium)  AMBULATORY STATUS COMMUNICATION OF NEEDS Skin   Limited Assist Verbally PU Stage and Appropriate Care  Pressure injury stage 1 with non-blanchable redness of a localized area usually over a bony prominence Location: sacrum                     Personal Care Assistance Level of Assistance  Bathing, Feeding, Dressing Bathing Assistance: Limited assistance Feeding assistance: Independent Dressing Assistance: Limited assistance     Functional Limitations Info  Sight, Hearing, Speech Sight Info: Adequate Hearing Info: Adequate Speech Info: Adequate    SPECIAL CARE FACTORS FREQUENCY  PT (By licensed PT), OT (By licensed OT)     PT Frequency: 5x OT Frequency: 5x            Contractures Contractures Info: Not present    Additional Factors Info  Code Status, Allergies Code Status Info: full Allergies Info: oxycodone           Current Medications (02/12/2017):  This is the current hospital active medication list Current Facility-Administered Medications  Medication Dose Route Frequency Provider Last Rate Last Dose  . lactulose (CHRONULAC) 10 GM/15ML solution 30 g  30 g Oral BID Edwin Dada, MD   30 g at 02/12/17 0905  .  ondansetron (ZOFRAN) tablet 4 mg  4 mg Oral Q6H PRN Danford, Suann Larry, MD       Or  . ondansetron (ZOFRAN) injection 4 mg  4 mg Intravenous Q6H PRN Danford, Suann Larry, MD      . rifaximin Doreene Nest) tablet 550 mg  550 mg Oral BID Donne Hazel, MD      . sodium bicarbonate tablet 650 mg  650 mg Oral BID Edwin Dada, MD   650 mg at 02/12/17 1991     Discharge Medications: Please see discharge  summary for a list of discharge medications.  Relevant Imaging Results:  Relevant Lab Results:   Additional Information SS#: [id number]  Nila Nephew, LCSW

## 2017-02-13 ENCOUNTER — Encounter (HOSPITAL_COMMUNITY): Payer: Self-pay

## 2017-02-13 LAB — COMPREHENSIVE METABOLIC PANEL
ALBUMIN: 2.9 g/dL — AB (ref 3.5–5.0)
ALT: 30 U/L (ref 17–63)
AST: 35 U/L (ref 15–41)
Alkaline Phosphatase: 111 U/L (ref 38–126)
Anion gap: 3 — ABNORMAL LOW (ref 5–15)
BUN: 23 mg/dL — ABNORMAL HIGH (ref 6–20)
CHLORIDE: 116 mmol/L — AB (ref 101–111)
CO2: 20 mmol/L — AB (ref 22–32)
Calcium: 8.1 mg/dL — ABNORMAL LOW (ref 8.9–10.3)
Creatinine, Ser: 1.31 mg/dL — ABNORMAL HIGH (ref 0.61–1.24)
GFR calc Af Amer: >60 mL/min (ref >60)
GFR calc non Af Amer: 54 mL/min — ABNORMAL LOW (ref >60)
GLUCOSE: 123 mg/dL — AB (ref 65–99)
POTASSIUM: 4 mmol/L (ref 3.5–5.1)
Sodium: 139 mmol/L (ref 135–145)
Total Bilirubin: 1.6 mg/dL — ABNORMAL HIGH (ref 0.3–1.2)
Total Protein: 5 g/dL — ABNORMAL LOW (ref 6.5–8.1)

## 2017-02-13 LAB — CBC
HEMATOCRIT: 27.9 % — AB (ref 39.0–52.0)
HEMOGLOBIN: 9.9 g/dL — AB (ref 13.0–17.0)
MCH: 30.8 pg (ref 26.0–34.0)
MCHC: 35.5 g/dL (ref 30.0–36.0)
MCV: 86.9 fL (ref 78.0–100.0)
Platelets: 41 10*3/uL — ABNORMAL LOW (ref 150–400)
RBC: 3.21 MIL/uL — ABNORMAL LOW (ref 4.22–5.81)
RDW: 14.7 % (ref 11.5–15.5)
WBC: 2.2 10*3/uL — ABNORMAL LOW (ref 4.0–10.5)

## 2017-02-13 LAB — URINE CULTURE: Culture: >=100000 — AB

## 2017-02-13 LAB — AMMONIA: Ammonia: 64 umol/L — ABNORMAL HIGH (ref 9–35)

## 2017-02-13 MED ORDER — SPIRONOLACTONE 25 MG PO TABS
100.0000 mg | ORAL_TABLET | Freq: Every day | ORAL | Status: DC
  Administered 2017-02-13 – 2017-02-14 (×2): 100 mg via ORAL
  Filled 2017-02-13 (×2): qty 4

## 2017-02-13 MED ORDER — FUROSEMIDE 20 MG PO TABS
20.0000 mg | ORAL_TABLET | Freq: Two times a day (BID) | ORAL | Status: DC
  Administered 2017-02-13 – 2017-02-14 (×3): 20 mg via ORAL
  Filled 2017-02-13 (×3): qty 1

## 2017-02-13 NOTE — Consult Note (Signed)
Referring Provider: [facility]  Primary Care Physician:  Leanna Battles, MD Primary Gastroenterologist:  Scarlette Shorts,  MD  Reason for Consultation: hepatic encephalopathy  ASSESSMENT AND PLAN:   58. 71 yo male with decompensated NASH cirrhosis. Unknown precipitating factor. Patient states he was taking the lactulose at home though he may have been taking Imodium as well.  Currently admitted with HE, improving on lactulose and xifaxan. He has 1+ BLE edema, suspect ascites as well.  No abdominal pain to suggest SBP.  -No asterixis on exam. Continue the BID lactulose and xifaxan.  -needs I+O, daily weight -continue 40 lasix / aldactone 100 -continue low sodium diet.   2. 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   3. CKD 3, stable.   HPI: [name] House is a 71 y.o. 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 He has a hx of Roux -en-Y bypass, DM and CKD. We have had him on lasix and aldactone, lactulose and low sodium diet. He has required LVP in January 2017 and again in March of this year . Felt not to be a TIPS candidate.   Patient was admitted 2 days ago with confusion.  Head CTscan unrevealing. U/A unremarkable. Currently being treated for presumed HE. He is being treated with Xifaxan and lactulose 30 g BID. No abdominal pain. Says he slept through the night. He consumed all of his breakfast.     Past Medical History:  Diagnosis Date  . Abdominal hernia   . Ascites   . Benign neoplasm of colon   . Cirrhosis ([place])   . Diabetes mellitus, type 2 ([place])    resolved with gastric bypass  . Diverticulosis of colon (without mention of hemorrhage)   . Esophageal varices ([place])   . Hyperlipemia   . Hypertension   . Iron deficiency anemia secondary to blood loss (chronic)   . Kidney stones   . Liver cyst   . Lymphopenia   . NASH (nonalcoholic steatohepatitis)   . Osteoarthritis of hip   . Renal cyst     . Splenomegaly   . Unspecified sleep apnea    Resolved    Past Surgical History:  Procedure Laterality Date  . ANKLE SURGERY     Bil  . COLONOSCOPY  2012  . GASTRIC BYPASS  2005  . KNEE SURGERY     Bil  . POLYPECTOMY    . TOTAL HIP ARTHROPLASTY  2008   right    Prior to Admission medications   Medication Sig Start Date End Date Taking? Authorizing Provider  diphenoxylate-atropine (LOMOTIL) 2.5-0.025 MG tablet Take 1 tablet by mouth 4 (four) times daily as needed for diarrhea or loose stools.   Yes [provider]  furosemide (LASIX) 20 MG tablet Take 1 tablet (20 mg total) by mouth 2 (two) times daily. 01/15/17  Yes Irene Shipper, MD  lactulose (CHRONULAC) 10 GM/15ML solution Take 45 mLs (30 g total) by mouth 2 (two) times daily. 12/24/16  Yes Elgergawy, Silver Huguenin, MD  sodium bicarbonate 650 MG tablet Take 650 mg by mouth 2 (two) times daily.   Yes [provider]  spironolactone (ALDACTONE) 100 MG tablet Take 1 tablet (100 mg total) by mouth daily. 12/25/16  Yes Elgergawy, Silver Huguenin, MD    Current Facility-Administered Medications  Medication Dose Route Frequency Provider Last Rate Last Dose  . furosemide (LASIX) tablet 20 mg  20 mg Oral BID Donne Hazel, MD  20 mg at 02/13/17 0834  . lactulose (CHRONULAC) 10 GM/15ML solution 30 g  30 g Oral BID Edwin Dada, MD   30 g at 02/13/17 0834  . ondansetron (ZOFRAN) tablet 4 mg  4 mg Oral Q6H PRN Danford, Suann Larry, MD       Or  . ondansetron (ZOFRAN) injection 4 mg  4 mg Intravenous Q6H PRN Danford, Suann Larry, MD      . rifaximin Doreene Nest) tablet 550 mg  550 mg Oral BID Donne Hazel, MD   550 mg at 02/13/17 0834  . sodium bicarbonate tablet 650 mg  650 mg Oral BID Edwin Dada, MD   650 mg at 02/13/17 0834  . spironolactone (ALDACTONE) tablet 100 mg  100 mg Oral Daily Donne Hazel, MD   100 mg at 02/13/17 9528    Allergies as of 02/11/2017 - Review Complete 02/11/2017  Allergen  Reaction Noted  . Oxycodone Other (See Comments) 07/05/2013    Family History  Problem Relation Age of Onset  . Heart attack Father        deceased due to MI  . Heart disease Father   . Diabetes Mother        deceased due to complications of DM  . Coronary artery disease Brother   . Colon cancer Neg Hx   . Stomach cancer Neg Hx   . Liver disease Neg Hx     Social History   Social History  . Marital status: Married    Spouse name: N/A  . Number of children: 1  . Years of education: N/A   Occupational History  . retired   . sports official    Social History Main Topics  . Smoking status: Never Smoker  . Smokeless tobacco: Never Used  . Alcohol use No     Comment: none  . Drug use: No  . Sexual activity: Not on file   Other Topics Concern  . Not on file   Social History Narrative  . No narrative on file    Review of Systems: All systems reviewed and negative except where noted in HPI.  Physical Exam: Vital signs in last 24 hours: Temp:  [97.7 F (36.5 C)-98.2 F (36.8 C)] 98.2 F (36.8 C) (05/10 0451) Pulse Rate:  [66-87] 66 (05/10 0451) Resp:  [18-20] 18 (05/10 0451) BP: (106-134)/(49-62) 106/49 (05/10 0451) SpO2:  [98 %-99 %] 98 % (05/10 0451) Last BM Date: 02/11/17 General:   Alert, well-developed white male inNAD Eyes:  Sclera clear, no icterus.   Conjunctiva pink. Ears:  Normal auditory acuity. Nose:  No deformity, discharge,  or lesions. Neck:  Supple; no masses Lungs:  Normal respiratory effort. No wheezes.  Heart:  Regular rate and rhythm; no murmurs, 1+ BLE edema Abdomen:  Soft, moderately distended, nontender, BS active, no palp mass    Rectal:  Deferred  Msk:  Symmetrical without gross deformities. . Neurologic:  Alert, cooperative, follows commands. Reaction time a little slow but answers questions without elaboration. no asterixis. . Skin:  Intact without significant lesions or rashes.. Psych:  Alert and cooperative. Normal mood and  affect.  Intake/Output from previous day: 05/09 0701 - 05/10 0700 In: 686.3 [P.O.:360; I.V.:326.3] Out: -  Intake/Output this shift: No intake/output data recorded.  Lab Results:  Recent Labs  02/11/17 2005 02/12/17 0521 02/13/17 0507  WBC 2.8* 2.2* 2.2*  HGB 11.2* 9.5* 9.9*  HCT 32.7* 27.7* 27.9*  PLT 44* 39* 41*   BMET  Recent Labs  02/11/17 1845 02/12/17 0521 02/13/17 0507  NA 139 140 139  K 4.1 3.8 4.0  CL 112* 115* 116*  CO2 17* 19* 20*  GLUCOSE 216* 177* 123*  BUN 25* 24* 23*  CREATININE 1.53* 1.46* 1.31*  CALCIUM 8.4* 8.2* 8.1*   LFT  Recent Labs  02/13/17 0507  PROT 5.0*  ALBUMIN 2.9*  AST 35  ALT 30  ALKPHOS 111  BILITOT 1.6*    Studies/Results: Ct Head Wo Contrast  Result Date: 02/11/2017 CLINICAL DATA:  Altered mental status.  Confusion. EXAM: CT HEAD WITHOUT CONTRAST TECHNIQUE: Contiguous axial images were obtained from the base of the skull through the vertex without intravenous contrast. COMPARISON:  None. FINDINGS: Brain: No evidence of acute infarction, hemorrhage, hydrocephalus, extra-axial collection or mass lesion/mass effect. Vascular: Vascular calcifications are present. Skull: Normal. Negative for fracture or focal lesion. Sinuses/Orbits: No acute finding. Other: None. IMPRESSION: No acute intracranial abnormalities. Electronically Signed   By: Lucienne Capers M.D.   On: 02/11/2017 21:52    Tye Savoy, NP-C @  02/13/2017, 8:54 AM  Pager number [phone]  GI ATTENDING  History, laboratories, x-rays, prior endoscopy reports reviewed. Patient personally seen and examined. Agree with comprehensive consultation note as outlined above. Patient well known to me for management of his cirrhosis and other GI issues. Admitted yesterday with symptomatic hepatic encephalopathy. No obvious precipitant. Had been using lactulose at home. Now on Xifaxan and doing better today. Electrolytes look fine. I suspect he can go home tomorrow on his  admission medications with addition to Xifaxan 550 mg twice daily. Discussed with Dr. Wyline Copas. We will arrange outpatient follow-up with myself or GI advanced practitioner. Thank you.  Docia Chuck. Geri Seminole., M.D. [facility] Division of Gastroenterology

## 2017-02-13 NOTE — Progress Notes (Addendum)
CSW following for disposition/DC planning. Plan is for SNF at [place] for [place] rehab.  Spoke with pt's wife to provide bed offers. Pt and wife hopeful that [facility] will be able to accept pt as pt is already planning to move to independent living there.  CSW spoke with [facility]. Unable to offer bed as of yet but will follow up with CSW and pt's wife. Wife receptive to other bed offers if [facility] unable to offer bed. Will reach out to CSW with choice after deliberation.   Will continue following and assist with DC planning.  Sharren Bridge, MSW, LCSW Clinical Social Work 02/13/2017 [phone]  14:48- Otilio Carpen at [facility] states facility can offer SNF bed to pt at [place].  CSW obtained insurance authorization [id number] from [place]. Delilah also will inform [place] Baker Janus [phone] ex:[phone])  Left voicemail for pt's wife requesting returned call in order to inform her of pt's bed at [facility].

## 2017-02-13 NOTE — Progress Notes (Signed)
PROGRESS NOTE    [name] House  GUR:[id number] DOB: 08/09/1946 DOA: 02/11/2017 PCP: Leanna Battles, MD    Brief Narrative:  71 y.o. male with a past medical history significant for NASH cirrhosis, history of Roux-and-Y gastric bypass, HTN, and varices and HE who presents with confusion over the past week.  The patient was in his usual state of health until about 3-4 days ago when he started to notice increasing confusion, psychomotor slowing, feeling like he can't remember things, feeling generalized malaise and fatigue.  He has had no abdominal pain.  He has had no fever, chills, cough, dysuria, urinary irritation.  He has been taking his diuretics and lactulose as prescribed and having 3-4 BMs per day.  He has been having no increase in abdominal girth or leg swelling.  Family tell me outside the room, that they have noticed increased psychomotor slowing over about the last month or more and that he appears to be having a hard time with his ADLs at home.  Assessment & Plan:   Principal Problem:   Hepatic encephalopathy (HCC) Active Problems:   Iron deficiency anemia   Essential hypertension   Diabetes mellitus type 2, controlled (HCC)   Thrombocytopenia (HCC)   Metabolic acidosis, normal anion gap (NAG)   Liver cirrhosis secondary to NASH (HCC)   CKD (chronic kidney disease) stage 4, GFR 15-29 ml/min (HCC)   Pressure injury of skin  1. Hepatic encephalopathy:  - Presenting ammonia level elevated at 125 - Pt claims adherence to meds.     -Lactulose has been continued and scheduled with xifaxan ordered per GI recs - Appreciate input by GI. Discussed case with Dr. Henrene Pastor, possible d/c tomorrow if stable -repeat LFT's and ammonia in AM  2. Acidosis:  - Remains stable at present -Will continue Bicarb as tolerated  3. Chronic kidney disease:  -remains stable at present  4. Pancytopenia:  -likely secondary to chronic disease, iron deficiency, portal hypertension.  -Will  recheck CBC in AM  5. NASH cirrhosis:  -Will continue with fursoemide and spironolactone as tolerated  6. Leukopenia:  -Pt is without evidence of infection. -Culture for fever  DVT prophylaxis: SCD's Code Status: Full Family Communication: Pt in room, family not at bedside Disposition Plan: SNF, timing uncertain  Consultants:   GI  Procedures:     Antimicrobials: Anti-infectives    Start     Dose/Rate Route Frequency Ordered Stop   02/12/17 2200  rifaximin (XIFAXAN) tablet 550 mg     550 mg Oral 2 times daily 02/12/17 1611        Subjective: Reports feeling better today  Objective: Vitals:   02/12/17 1500 02/12/17 2152 02/13/17 0451 02/13/17 1430  BP: 130/62 (!) 134/55 (!) 106/49 (!) 120/55  Pulse: 87 78 66 75  Resp: 18 20 18 18   Temp: 97.7 F (36.5 C) 98 F (36.7 C) 98.2 F (36.8 C) 98 F (36.7 C)  TempSrc: Oral Oral Oral Oral  SpO2: 98% 99% 98% 98%  Weight:      Height:        Intake/Output Summary (Last 24 hours) at 02/13/17 1550 Last data filed at 02/13/17 1300  Gross per 24 hour  Intake              480 ml  Output                0 ml  Net              480 ml  Filed Weights   02/11/17 1827 02/12/17 0139  Weight: 90.7 kg (200 lb) 91.1 kg (200 lb 13.4 oz)    Examination: General exam: Asleep, easily arousable, laying in bed, in nad Respiratory system: Normal respiratory effort, no wheezing Cardiovascular system: regular rate, s1, s2 Gastrointestinal system: Soft, nondistended, positive BS Central nervous system: CN2-12 grossly intact, strength intact Extremities: Perfused, no clubbing Skin: Normal skin turgor, no notable skin lesions seen Psychiatry: Mood normal // no visual hallucinations     Data Reviewed: I have personally reviewed following labs and imaging studies  CBC:  Recent Labs Lab 02/11/17 2005 02/12/17 0521 02/13/17 0507  WBC 2.8* 2.2* 2.2*  HGB 11.2* 9.5* 9.9*  HCT 32.7* 27.7* 27.9*  MCV 87.4 86.8 86.9  PLT 44*  39* 41*   Basic Metabolic Panel:  Recent Labs Lab 02/11/17 1845 02/12/17 0521 02/13/17 0507  NA 139 140 139  K 4.1 3.8 4.0  CL 112* 115* 116*  CO2 17* 19* 20*  GLUCOSE 216* 177* 123*  BUN 25* 24* 23*  CREATININE 1.53* 1.46* 1.31*  CALCIUM 8.4* 8.2* 8.1*   GFR: Estimated Creatinine Clearance: 60.6 mL/min (A) (by C-G formula based on SCr of 1.31 mg/dL (H)). Liver Function Tests:  Recent Labs Lab 02/11/17 1845 02/12/17 0521 02/13/17 0507  AST 41 32 35  ALT 27 27 30   ALKPHOS 128* 112 111  BILITOT 1.4* 1.4* 1.6*  PROT 5.7* 5.2* 5.0*  ALBUMIN 3.3* 3.0* 2.9*   No results for input(s): LIPASE, AMYLASE in the last 168 hours.  Recent Labs Lab 02/11/17 2118 02/12/17 0521 02/13/17 0507  AMMONIA 125* 46* 64*   Coagulation Profile: No results for input(s): INR, PROTIME in the last 168 hours. Cardiac Enzymes: No results for input(s): CKTOTAL, CKMB, CKMBINDEX, TROPONINI in the last 168 hours. BNP (last 3 results) No results for input(s): PROBNP in the last 8760 hours. HbA1C: No results for input(s): HGBA1C in the last 72 hours. CBG:  Recent Labs Lab 02/11/17 1838  GLUCAP 205*   Lipid Profile: No results for input(s): CHOL, HDL, LDLCALC, TRIG, CHOLHDL, LDLDIRECT in the last 72 hours. Thyroid Function Tests: No results for input(s): TSH, T4TOTAL, FREET4, T3FREE, THYROIDAB in the last 72 hours. Anemia Panel: No results for input(s): VITAMINB12, FOLATE, FERRITIN, TIBC, IRON, RETICCTPCT in the last 72 hours. Sepsis Labs: No results for input(s): PROCALCITON, LATICACIDVEN in the last 168 hours.  Recent Results (from the past 240 hour(s))  Urine culture     Status: Abnormal   Collection Time: 02/11/17  7:13 PM  Result Value Ref Range Status   Specimen Description URINE, RANDOM  Final   Special Requests NONE  Final   Culture >=100,000 COLONIES/mL STREPTOCOCCUS MITIS/ORALIS (A)  Final   Report Status 02/13/2017 FINAL  Final     Radiology Studies: Ct Head Wo  Contrast  Result Date: 02/11/2017 CLINICAL DATA:  Altered mental status.  Confusion. EXAM: CT HEAD WITHOUT CONTRAST TECHNIQUE: Contiguous axial images were obtained from the base of the skull through the vertex without intravenous contrast. COMPARISON:  None. FINDINGS: Brain: No evidence of acute infarction, hemorrhage, hydrocephalus, extra-axial collection or mass lesion/mass effect. Vascular: Vascular calcifications are present. Skull: Normal. Negative for fracture or focal lesion. Sinuses/Orbits: No acute finding. Other: None. IMPRESSION: No acute intracranial abnormalities. Electronically Signed   By: Lucienne Capers M.D.   On: 02/11/2017 21:52    Scheduled Meds: . furosemide  20 mg Oral BID  . lactulose  30 g Oral BID  . rifaximin  550 mg Oral BID  . sodium bicarbonate  650 mg Oral BID  . spironolactone  100 mg Oral Daily   Continuous Infusions:   LOS: 2 days   CHIU, Orpah Melter, MD [facility] Pager [phone]  If 7PM-7AM, please contact night-coverage [email or web address] Password [email or web address] 02/13/2017, 3:50 PM

## 2017-02-13 NOTE — Progress Notes (Signed)
Physical Therapy Treatment Patient Details Name: [name] House MRN: [id number] DOB: [birth year] Today's Date: 02/13/2017    History of Present Illness 71 yo male admitted with hepatic encephalopathy, AMS, Hx of DM, NASH cirrhosis, gastric bypass, chronic anemia, fall, OA.     PT Comments    Assisted OOB with increased time to amb to bathroom.  Very unsteady gait did not use any AD as prior.  Required increased assist.  Assisted in bathroom for hygiene and balance.  Amb a decreased distance due to weakness/fatigue/illness. Pt will need St Rehab at SNF to regain prior level of mobility.    Follow Up Recommendations  SNF     Equipment Recommendations       Recommendations for Other Services       Precautions / Restrictions Precautions Precautions: Fall Restrictions Weight Bearing Restrictions: No    Mobility  Bed Mobility Overal bed mobility: Needs Assistance Bed Mobility: Supine to Sit;Sit to Supine     Supine to sit: Min assist Sit to supine: Min assist;Mod assist   General bed mobility comments: required increased assist back to bed due to fatigue.    Transfers Overall transfer level: Needs assistance Equipment used: Rolling walker (2 wheeled) Transfers: Sit to/from Stand Sit to Stand: Min assist         General transfer comment: Small amount of assist to rise, stabilize. VCS safety, hand placement.  weak/tired  Ambulation/Gait Ambulation/Gait assistance: Min assist Ambulation Distance (Feet): 34 Feet Assistive device: Rolling walker (2 wheeled) Gait Pattern/deviations: Step-through pattern;Decreased stride length;Narrow base of support Gait velocity: decreased   General Gait Details: very unsteady gait with decreased amb distance due to fatigue/weakness.     Stairs            Wheelchair Mobility    Modified Rankin (Stroke Patients Only)       Balance                                            Cognition  Arousal/Alertness: Awake/alert Behavior During Therapy: WFL for tasks assessed/performed Overall Cognitive Status: Within Functional Limits for tasks assessed                                        Exercises      General Comments        Pertinent Vitals/Pain Pain Assessment: No/denies pain    Home Living                      Prior Function            PT Goals (current goals can now be found in the care plan section) Progress towards PT goals: Progressing toward goals    Frequency    Min 3X/week      PT Plan Current plan remains appropriate    Co-evaluation              AM-PAC PT "6 Clicks" Daily Activity  Outcome Measure  Difficulty turning over in bed (including adjusting bedclothes, sheets and blankets)?: A Little Difficulty moving from lying on back to sitting on the side of the bed? : A Little Difficulty sitting down on and standing up from a chair with arms (e.g., wheelchair, bedside commode, etc,.)?: A Little Help needed moving  to and from a bed to chair (including a wheelchair)?: A Little Help needed walking in hospital room?: A Little Help needed climbing 3-5 steps with a railing? : A Little 6 Click Score: 18    End of Session Equipment Utilized During Treatment: Gait belt Activity Tolerance: Patient limited by fatigue Patient left: in bed;with bed alarm set;with call bell/phone within reach Nurse Communication: Mobility status PT Visit Diagnosis: Muscle weakness (generalized) (M62.81);Difficulty in walking, not elsewhere classified (R26.2)     Time: 1000-1020 PT Time Calculation (min) (ACUTE ONLY): 20 min  Charges:  $Gait Training: 8-22 mins                    G Codes:       Rica Koyanagi  PTA [facility]  Acute  Rehab Pager      [phone]

## 2017-02-13 NOTE — Clinical Social Work Note (Signed)
Clinical Social Work Assessment  Patient Details  Name: [name] House MRN: [id number] Date of Birth: 01-19-1946  Date of referral:  02/13/17               Reason for consult:  Facility Placement                Permission sought to share information with:  Family Supports Permission granted to share information::     Name::     Wife Santiago Glad  Agency::     Relationship::     Contact Information:  [phone]  Housing/Transportation Living arrangements for the past 2 months:  Single Family Home Source of Information:  Patient, Spouse, Medical Team Patient Interpreter Needed:  None Criminal Activity/Legal Involvement Pertinent to Current Situation/Hospitalization:  No - Comment as needed Significant Relationships:  Other Family Members, Adult Children, Spouse Lives with:  Spouse Do you feel safe going back to the place where you live?  Yes Need for family participation in patient care:  Yes (Comment) (wife involved in decision making)  Care giving concerns:  Pt from home where he resides with wife. Wife reports prior to hospitalization pt was ambulating independently "shuffling, can't get very far." Has a walker but does not use it often at home per pt and wife. Had also declined in ability to perform ADLs in past few days. Wife reports following receiving ST rehab in 11/2016, pt had been functioning with very minimal assistance from her and they hope to have pt return to that level of functioning.    Social Worker assessment / plan:  CSW consulted for potential SNF placement. Met with pt and family at bedside. Explained role and reason for consult.  Pt and family agree to SNF referrals. Explain pt is in process of making arrangements to move to [place] and are hoping to go to SNF there as well. Agree to other referrals in case of no bed availability.  Completed FL2, referred to area SNFs. Will need [employer] authorization.   Plan: SNF at DC. Will  follow up with family with bed offers.   Employment status:  Retired Nurse, adult PT Recommendations:  [place] / Referral to community resources:  [place]  Patient/Family's Response to care:  Pt and spouse express appreciation for care while in hospital, voicing that staff have been helpful and kind to them.   Patient/Family's Understanding of and Emotional Response to Diagnosis, Current Treatment, and Prognosis:  Pt and spouse demonstrate adequate understanding of plan. Hopeful for progress at [facility] rehab and to be able to go home following SNF.   Emotional Assessment Appearance:  Appears stated age Attitude/Demeanor/Rapport:   (pleasant) Affect (typically observed):  Accepting, Calm Orientation:  Oriented to Self, Oriented to Place, Oriented to  Time, Oriented to Situation Alcohol / Substance use:  Not Applicable Psych involvement (Current and /or in the community):  No (Comment)  Discharge Needs  Concerns to be addressed:  Discharge Planning Concerns Readmission within the last 30 days:  No Current discharge risk:  None Barriers to Discharge:  Continued Medical Work up, [employer], [place] 02/13/2017, 8:36 AM  [phone]

## 2017-02-13 NOTE — Consult Note (Addendum)
   [facility] Inpatient Consult   02/13/2017  PERRIN GENS 06-06-1946 [id number]    Patient screened for potential [facility] Care Management services. Chart reviewed. Noted current discharge plan is for  [facility].  Went to patient's room. However, Mr Partin was sleeping soundly and wife was not at bedside.  There are no identifiable [facility] Care Management needs at this time. If patient's post hospital needs change, please place a [facility] Care Management consult. For questions please contact:  Marthenia Rolling, [place], RN,BSN [facility] Liaison [phone]

## 2017-02-14 DIAGNOSIS — R601 Generalized edema: Secondary | ICD-10-CM | POA: Diagnosis not present

## 2017-02-14 DIAGNOSIS — I10 Essential (primary) hypertension: Secondary | ICD-10-CM | POA: Diagnosis not present

## 2017-02-14 DIAGNOSIS — M159 Polyosteoarthritis, unspecified: Secondary | ICD-10-CM | POA: Diagnosis not present

## 2017-02-14 DIAGNOSIS — E872 Acidosis: Secondary | ICD-10-CM | POA: Diagnosis not present

## 2017-02-14 DIAGNOSIS — K7460 Unspecified cirrhosis of liver: Secondary | ICD-10-CM | POA: Diagnosis not present

## 2017-02-14 DIAGNOSIS — D696 Thrombocytopenia, unspecified: Secondary | ICD-10-CM | POA: Diagnosis not present

## 2017-02-14 DIAGNOSIS — K7290 Hepatic failure, unspecified without coma: Secondary | ICD-10-CM | POA: Diagnosis not present

## 2017-02-14 DIAGNOSIS — R4182 Altered mental status, unspecified: Secondary | ICD-10-CM | POA: Diagnosis not present

## 2017-02-14 DIAGNOSIS — D126 Benign neoplasm of colon, unspecified: Secondary | ICD-10-CM | POA: Diagnosis not present

## 2017-02-14 DIAGNOSIS — L8990 Pressure ulcer of unspecified site, unspecified stage: Secondary | ICD-10-CM | POA: Diagnosis not present

## 2017-02-14 DIAGNOSIS — R278 Other lack of coordination: Secondary | ICD-10-CM | POA: Diagnosis not present

## 2017-02-14 DIAGNOSIS — E119 Type 2 diabetes mellitus without complications: Secondary | ICD-10-CM | POA: Diagnosis not present

## 2017-02-14 DIAGNOSIS — N390 Urinary tract infection, site not specified: Secondary | ICD-10-CM | POA: Diagnosis not present

## 2017-02-14 DIAGNOSIS — R739 Hyperglycemia, unspecified: Secondary | ICD-10-CM | POA: Diagnosis not present

## 2017-02-14 DIAGNOSIS — R262 Difficulty in walking, not elsewhere classified: Secondary | ICD-10-CM | POA: Diagnosis not present

## 2017-02-14 DIAGNOSIS — M6281 Muscle weakness (generalized): Secondary | ICD-10-CM | POA: Diagnosis not present

## 2017-02-14 DIAGNOSIS — K7581 Nonalcoholic steatohepatitis (NASH): Secondary | ICD-10-CM | POA: Diagnosis not present

## 2017-02-14 DIAGNOSIS — N184 Chronic kidney disease, stage 4 (severe): Secondary | ICD-10-CM | POA: Diagnosis not present

## 2017-02-14 DIAGNOSIS — I2510 Atherosclerotic heart disease of native coronary artery without angina pectoris: Secondary | ICD-10-CM | POA: Diagnosis not present

## 2017-02-14 DIAGNOSIS — D509 Iron deficiency anemia, unspecified: Secondary | ICD-10-CM | POA: Diagnosis not present

## 2017-02-14 DIAGNOSIS — R41841 Cognitive communication deficit: Secondary | ICD-10-CM | POA: Diagnosis not present

## 2017-02-14 DIAGNOSIS — Z87442 Personal history of urinary calculi: Secondary | ICD-10-CM | POA: Diagnosis not present

## 2017-02-14 LAB — COMPREHENSIVE METABOLIC PANEL
ALT: 33 U/L (ref 17–63)
ANION GAP: 7 (ref 5–15)
AST: 38 U/L (ref 15–41)
Albumin: 2.8 g/dL — ABNORMAL LOW (ref 3.5–5.0)
Alkaline Phosphatase: 111 U/L (ref 38–126)
BILIRUBIN TOTAL: 1.5 mg/dL — AB (ref 0.3–1.2)
BUN: 24 mg/dL — ABNORMAL HIGH (ref 6–20)
CALCIUM: 8.2 mg/dL — AB (ref 8.9–10.3)
CO2: 18 mmol/L — ABNORMAL LOW (ref 22–32)
CREATININE: 1.33 mg/dL — AB (ref 0.61–1.24)
Chloride: 111 mmol/L (ref 101–111)
GFR, EST NON AFRICAN AMERICAN: 53 mL/min — AB (ref >60)
Glucose, Bld: 137 mg/dL — ABNORMAL HIGH (ref 65–99)
POTASSIUM: 4.1 mmol/L (ref 3.5–5.1)
Sodium: 136 mmol/L (ref 135–145)
Total Protein: 4.9 g/dL — ABNORMAL LOW (ref 6.5–8.1)

## 2017-02-14 LAB — AMMONIA: AMMONIA: 83 umol/L — AB (ref 9–35)

## 2017-02-14 MED ORDER — RIFAXIMIN 550 MG PO TABS: 550.0000 mg | ORAL_TABLET | Freq: Two times a day (BID) | ORAL | 0 refills | Status: DC

## 2017-02-14 NOTE — Progress Notes (Signed)
     [facility] Progress Note  Chief Complaint:  Hepatic encephalopathy    Subjective: No complaints. He was sleeping when I came in.   Objective:  Vital signs in last 24 hours: Temp:  [98 F (36.7 C)-98.5 F (36.9 C)] 98.1 F (36.7 C) (05/11 0535) Pulse Rate:  [72-80] 72 (05/11 0535) Resp:  [16-18] 16 (05/11 0535) BP: (115-126)/(55-63) 115/59 (05/11 0535) SpO2:  [98 %] 98 % (05/11 0535) Weight:  [205 lb 14.6 oz (93.4 kg)-207 lb 7.3 oz (94.1 kg)] 207 lb 7.3 oz (94.1 kg) (05/11 0535) Last BM Date: 02/13/17 General: chronically ill appearing white male. Stretched out in bed sleeping. A little groggy upon waking.[name] House He is incontinent of gold colored unformed stool Heart:  Regular rate and rhythm, no lower extremity edema Pulm: Normal respiratory effort. Abdomen:  Soft, distended, nontender. Normal bowel sounds Neurologic:  Slow reaction time. Follows commands. No asterixis.[name] House Psych:  Alert and cooperative. Normal mood and affect.   Intake/Output from previous day: 05/10 0701 - 05/11 0700 In: 480 [P.O.:480] Out: -  Intake/Output this shift: No intake/output data recorded.  Lab Results:  Recent Labs  02/11/17 2005 02/12/17 0521 02/13/17 0507  WBC 2.8* 2.2* 2.2*  HGB 11.2* 9.5* 9.9*  HCT 32.7* 27.7* 27.9*  PLT 44* 39* 41*   BMET  Recent Labs  02/12/17 0521 02/13/17 0507 02/14/17 0519  NA 140 139 136  K 3.8 4.0 4.1  CL 115* 116* 111  CO2 19* 20* 18*  GLUCOSE 177* 123* 137*  BUN 24* 23* 24*  CREATININE 1.46* 1.31* 1.33*  CALCIUM 8.2* 8.1* 8.2*   LFT  Recent Labs  02/14/17 0519  PROT 4.9*  ALBUMIN 2.8*  AST 38  ALT 33  ALKPHOS 111  BILITOT 1.5*    Assessment / Plan:  1, 71 yo male with decompensated NASH cirrhosis. Presented with HE, unclear what precipitated it. He has no asterixis on exam but overall sluggish, slow reaction time. Electrolytes okay.  -continue lactulose and xifaxan.  -Doesn't appear quite ready for discharge. PT  has evaluated and recommends SNF.   2. CKD 3, at baseline  3. 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 Counts stable.    Principal Problem:   Hepatic encephalopathy (HCC) Active Problems:   Iron deficiency anemia   Essential hypertension   Diabetes mellitus type 2, controlled (HCC)   Thrombocytopenia (HCC)   Metabolic acidosis, normal anion gap (NAG)   Liver cirrhosis secondary to NASH (HCC)   CKD (chronic kidney disease) stage 4, GFR 15-29 ml/min (HCC)   Pressure injury of skin    LOS: 3 days   Tye Savoy NP 02/14/2017, 10:22 AM  Pager number [phone]  GI ATTENDING  Interval history data reviewed. Patient seen and examined. Agree with interval progress note as outlined above. Appears to be heading for skilled nursing facility transfer. He should continue all his admission GI medications and in addition Xifaxan 550 mg twice daily indefinitely. His lactulose should be titrated to achieve 3-5 bowel movements per day. Will sign off. Thank you  Docia Chuck. Geri Seminole., M.D. [facility] Division of Gastroenterology

## 2017-02-14 NOTE — Clinical Social Work Placement (Signed)
   CLINICAL SOCIAL WORK PLACEMENT  NOTE  Date:  02/14/2017  Patient Details  Name: [name] House MRN: [id number] Date of Birth: Sep 27, 1946  Clinical Social Work is seeking post-discharge placement for this patient at the [place] level of care (*CSW will initial, date and re-position this form in  chart as items are completed):  Yes   Patient/family provided with [place] Work Department's list of facilities offering this level of care within the geographic area requested by the patient (or if unable, by the patient's family).  Yes   Patient/family informed of their freedom to choose among providers that offer the needed level of care, that participate in Medicare, Medicaid or managed care program needed by the patient, have an available bed and are willing to accept the patient.  Yes   Patient/family informed of [facility]'s ownership interest in [facility] and [facility], as well as of the fact that they are under no obligation to receive care at these facilities.  PASRR submitted to EDS on       PASRR number received on       Existing PASRR number confirmed on 02/12/17     FL2 transmitted to all facilities in geographic area requested by pt/family on 02/12/17     FL2 transmitted to all facilities within larger geographic area on       Patient informed that his/her managed care company has contracts with or will negotiate with certain facilities, including the following:        Yes   Patient/family informed of bed offers received.  Patient chooses bed at [facility]     Physician recommends and patient chooses bed at [facility]    Patient to be transferred to [facility] on  .  Patient to be transferred to facility by [facility]     Patient family notified on 02/14/17 of transfer.  Name of family member notified:  Wife Santiago Glad     PHYSICIAN       Additional Comment:     _______________________________________________ Nila Nephew, LCSW 02/14/2017, 12:06 PM

## 2017-02-14 NOTE — Discharge Summary (Addendum)
Physician Discharge Summary  [name] House YTK:[id number] DOB: 1946-09-29 DOA: 02/11/2017  PCP: Leanna Battles, MD  Admit date: 02/11/2017 Discharge date: 02/14/2017  Admitted From: Home Disposition:  SNF  Recommendations for Outpatient Follow-up:  1. Follow up with PCP in 1-2 weeks 2. Follow up with GI as needed 3. Please titrate lactulose to ensure 3-5 bowel movements daily  Discharge Condition:Improved CODE STATUS:Full Diet recommendation: Low sodium   Brief/Interim Summary: 71 y.o.malewith a past medical history significant for NASH cirrhosis, history of Roux-and-Y gastric bypass, HTN, and varices and HEwho presents with confusion over the past week.  The patient was in his usual state of health until about 3-4 days ago when he started to notice increasing confusion, psychomotor slowing, feeling like he can't remember things, feeling generalized malaise and fatigue. He has had no abdominal pain. He has had no fever, chills, cough, dysuria, urinary irritation. He has been taking his diuretics and lactulose as prescribed and having 3-4 BMs per day. He has been having no increase in abdominal girth or leg swelling. Family tell me outside the room, that they have noticed increased psychomotor slowing over about the last month or more and that he appears to be having a hard time with his ADLs at home.  1. Hepatic encephalopathy: - Presenting ammonia level elevated at 125 - Pt claims adherence to meds.  -Lactulose has been continued and scheduled with xifaxan ordered per GI recs - Appreciate input by GI. Discussed case with Dr. Henrene Pastor, possible d/c tomorrow if stable -Overall demonstrated clinical improvement. Case discussed with GI. Pt is clear for d/c to SNF. Recommendations to ensure 3-5 bowel movements daily  2. Acidosis: - Remains stable at present -Will continue Bicarb as tolerated  3. Chronic kidney disease: -remains stable at present  4. Pancytopenia: -likely  secondary to chronic disease, iron deficiency, portal hypertension.  -Stable  5. NASH cirrhosis: -Continue with fursoemide and spironolactone as tolerated  6. Leukopenia: -Pt is without evidence of infection.  Bacturia  Clinically undetermined stage1 pressure ulcer   Discharge Diagnoses:  Principal Problem:   Hepatic encephalopathy ([place]) Active Problems:   Iron deficiency anemia   Essential hypertension   Diabetes mellitus type 2, controlled (HCC)   Thrombocytopenia (HCC)   Metabolic acidosis, normal anion gap (NAG)   Liver cirrhosis secondary to NASH (HCC)   CKD (chronic kidney disease) stage 4, GFR 15-29 ml/min (HCC)   Pressure injury of skin    Discharge Instructions   Allergies as of 02/14/2017      Reactions   Oxycodone Other (See Comments)   Reaction:  Caused pt to fall       Medication List    TAKE these medications   diphenoxylate-atropine 2.5-0.025 MG tablet Commonly known as:  LOMOTIL Take 1 tablet by mouth 4 (four) times daily as needed for diarrhea or loose stools.   furosemide 20 MG tablet Commonly known as:  LASIX Take 1 tablet (20 mg total) by mouth 2 (two) times daily.   lactulose 10 GM/15ML solution Commonly known as:  CHRONULAC Take 45 mLs (30 g total) by mouth 2 (two) times daily.   rifaximin 550 MG Tabs tablet Commonly known as:  XIFAXAN Take 1 tablet (550 mg total) by mouth 2 (two) times daily.   sodium bicarbonate 650 MG tablet Take 650 mg by mouth 2 (two) times daily.   spironolactone 100 MG tablet Commonly known as:  ALDACTONE Take 1 tablet (100 mg total) by mouth daily.  Contact information for follow-up providers    Irene Shipper, MD. Schedule an appointment as soon as possible for a visit.   Specialty:  Gastroenterology Why:  as needed Contact information: [address] [place] [address] [phone]            Contact information for after-discharge care    Destination    [facility]  SNF/ALF Follow up.   Specialty:  [place] information: [phone] [address] [address] [phone]                 Allergies  Allergen Reactions  . Oxycodone Other (See Comments)    Reaction:  Caused pt to fall     Consultations:  GI  Procedures/Studies: Ct Head Wo Contrast  Result Date: 02/11/2017 CLINICAL DATA:  Altered mental status.  Confusion. EXAM: CT HEAD WITHOUT CONTRAST TECHNIQUE: Contiguous axial images were obtained from the base of the skull through the vertex without intravenous contrast. COMPARISON:  None. FINDINGS: Brain: No evidence of acute infarction, hemorrhage, hydrocephalus, extra-axial collection or mass lesion/mass effect. Vascular: Vascular calcifications are present. Skull: Normal. Negative for fracture or focal lesion. Sinuses/Orbits: No acute finding. Other: None. IMPRESSION: No acute intracranial abnormalities. Electronically Signed   By: Lucienne Capers M.D.   On: 02/11/2017 21:52    Subjective: No complaints  Discharge Exam: Vitals:   02/14/17 0535 02/14/17 1337  BP: (!) 115/59 (!) 126/58  Pulse: 72 78  Resp: 16 18  Temp: 98.1 F (36.7 C) 99.3 F (37.4 C)   Vitals:   02/13/17 2008 02/13/17 2103 02/14/17 0535 02/14/17 1337  BP:  126/63 (!) 115/59 (!) 126/58  Pulse:  80 72 78  Resp:  18 16 18   Temp:  98.5 F (36.9 C) 98.1 F (36.7 C) 99.3 F (37.4 C)  TempSrc:  Oral Oral Oral  SpO2:  98% 98% 100%  Weight: 93.4 kg (205 lb 14.6 oz)  94.1 kg (207 lb 7.3 oz)   Height:        General: Pt is alert, awake, not in acute distress Cardiovascular: RRR, S1/S2 +, no rubs, no gallops Respiratory: CTA bilaterally, no wheezing, no rhonchi Abdominal: Soft, NT, ND, bowel sounds + Extremities: no edema, no cyanosis   The results of significant diagnostics from this hospitalization (including imaging, microbiology, ancillary and laboratory) are listed below for reference.      Microbiology: Recent Results (from the past 240 hour(s))  Urine culture     Status: Abnormal   Collection Time: 02/11/17  7:13 PM  Result Value Ref Range Status   Specimen Description URINE, RANDOM  Final   Special Requests NONE  Final   Culture >=100,000 COLONIES/mL STREPTOCOCCUS MITIS/ORALIS (A)  Final   Report Status 02/13/2017 FINAL  Final     Labs: BNP (last 3 results) No results for input(s): BNP in the last 8760 hours. Basic Metabolic Panel:  Recent Labs Lab 02/11/17 1845 02/12/17 0521 02/13/17 0507 02/14/17 0519  NA 139 140 139 136  K 4.1 3.8 4.0 4.1  CL 112* 115* 116* 111  CO2 17* 19* 20* 18*  GLUCOSE 216* 177* 123* 137*  BUN 25* 24* 23* 24*  CREATININE 1.53* 1.46* 1.31* 1.33*  CALCIUM 8.4* 8.2* 8.1* 8.2*   Liver Function Tests:  Recent Labs Lab 02/11/17 1845 02/12/17 0521 02/13/17 0507 02/14/17 0519  AST 41 32 35 38  ALT 27 27 30  33  ALKPHOS 128* 112 111 111  BILITOT 1.4* 1.4*  1.6* 1.5*  PROT 5.7* 5.2* 5.0* 4.9*  ALBUMIN 3.3* 3.0* 2.9* 2.8*   No results for input(s): LIPASE, AMYLASE in the last 168 hours.  Recent Labs Lab 02/11/17 2118 02/12/17 0521 02/13/17 0507 02/14/17 0519  AMMONIA 125* 46* 64* 83*   CBC:  Recent Labs Lab 02/11/17 2005 02/12/17 0521 02/13/17 0507  WBC 2.8* 2.2* 2.2*  HGB 11.2* 9.5* 9.9*  HCT 32.7* 27.7* 27.9*  MCV 87.4 86.8 86.9  PLT 44* 39* 41*   Cardiac Enzymes: No results for input(s): CKTOTAL, CKMB, CKMBINDEX, TROPONINI in the last 168 hours. BNP: Invalid input(s): POCBNP CBG:  Recent Labs Lab 02/11/17 1838  GLUCAP 205*   D-Dimer No results for input(s): DDIMER in the last 72 hours. Hgb A1c No results for input(s): HGBA1C in the last 72 hours. Lipid Profile No results for input(s): CHOL, HDL, LDLCALC, TRIG, CHOLHDL, LDLDIRECT in the last 72 hours. Thyroid function studies No results for input(s): TSH, T4TOTAL, T3FREE, THYROIDAB in the last 72 hours.  Invalid input(s): FREET3 Anemia work  up No results for input(s): VITAMINB12, FOLATE, FERRITIN, TIBC, IRON, RETICCTPCT in the last 72 hours. Urinalysis    Component Value Date/Time   COLORURINE YELLOW 02/11/2017 1913   APPEARANCEUR CLEAR 02/11/2017 1913   LABSPEC 1.014 02/11/2017 1913   PHURINE 5.0 02/11/2017 1913   GLUCOSEU NEGATIVE 02/11/2017 1913   HGBUR NEGATIVE 02/11/2017 [place] 02/11/2017 1913   KETONESUR NEGATIVE 02/11/2017 [place] 02/11/2017 1913   UROBILINOGEN 1.0 02/14/2015 0058   NITRITE NEGATIVE 02/11/2017 1913   LEUKOCYTESUR NEGATIVE 02/11/2017 1913   Sepsis Labs Invalid input(s): PROCALCITONIN,  WBC,  LACTICIDVEN Microbiology Recent Results (from the past 240 hour(s))  Urine culture     Status: Abnormal   Collection Time: 02/11/17  7:13 PM  Result Value Ref Range Status   Specimen Description URINE, RANDOM  Final   Special Requests NONE  Final   Culture >=100,000 COLONIES/mL STREPTOCOCCUS MITIS/ORALIS (A)  Final   Report Status 02/13/2017 FINAL  Final     SIGNED:   Donne Hazel, MD  [facility] 02/14/2017, 2:21 PM  If 7PM-7AM, please contact night-coverage [email or web address] Password [email or web address]

## 2017-02-17 ENCOUNTER — Telehealth: Payer: Self-pay

## 2017-02-17 ENCOUNTER — Other Ambulatory Visit: Payer: Self-pay

## 2017-02-17 NOTE — Telephone Encounter (Signed)
-----   Message from Irene Shipper, MD sent at 02/17/2017  2:31 PM EDT ----- No labs until further notice. Thanks for checking ----- Message ----- From: Doristine Counter, RN Sent: 02/17/2017   2:25 PM To: Irene Shipper, MD  Dr. Henrene Pastor, I am helping [name] out today, I see in her reminders that this patient will need follow up lab work this week. Looks like he was just hospitalized and is now at a skilled nursing facility. Do you want them to draw any labs? Thanks, Almyra Free

## 2017-02-17 NOTE — Telephone Encounter (Signed)
Message routed to [facility]. FYI

## 2017-02-18 ENCOUNTER — Non-Acute Institutional Stay (SKILLED_NURSING_FACILITY): Payer: PPO | Admitting: Nurse Practitioner

## 2017-02-18 ENCOUNTER — Encounter: Payer: Self-pay | Admitting: Nurse Practitioner

## 2017-02-18 DIAGNOSIS — K469 Unspecified abdominal hernia without obstruction or gangrene: Secondary | ICD-10-CM | POA: Diagnosis not present

## 2017-02-18 DIAGNOSIS — E119 Type 2 diabetes mellitus without complications: Secondary | ICD-10-CM | POA: Diagnosis not present

## 2017-02-18 DIAGNOSIS — N184 Chronic kidney disease, stage 4 (severe): Secondary | ICD-10-CM

## 2017-02-18 DIAGNOSIS — K7290 Hepatic failure, unspecified without coma: Secondary | ICD-10-CM | POA: Diagnosis not present

## 2017-02-18 DIAGNOSIS — D696 Thrombocytopenia, unspecified: Secondary | ICD-10-CM | POA: Diagnosis not present

## 2017-02-18 DIAGNOSIS — I10 Essential (primary) hypertension: Secondary | ICD-10-CM | POA: Diagnosis not present

## 2017-02-18 DIAGNOSIS — K7682 Hepatic encephalopathy: Secondary | ICD-10-CM

## 2017-02-18 DIAGNOSIS — K7460 Unspecified cirrhosis of liver: Secondary | ICD-10-CM

## 2017-02-18 DIAGNOSIS — R188 Other ascites: Secondary | ICD-10-CM

## 2017-02-18 DIAGNOSIS — K7581 Nonalcoholic steatohepatitis (NASH): Secondary | ICD-10-CM

## 2017-02-18 NOTE — Assessment & Plan Note (Signed)
02/14/17 creat 1.33

## 2017-02-18 NOTE — Assessment & Plan Note (Addendum)
Controlled, continue Spironolactone 100mg  qd, Furosemide 20mg  bid

## 2017-02-18 NOTE — Progress Notes (Signed)
Location:  [facility] Room Number: 35 Place of Service:  SNF (31) Provider:  Kao Conry, Manxie  NP  Estill Dooms, MD  Patient Care Team: Estill Dooms, MD as PCP - General (Internal Medicine) Sherran Margolis X, NP as Nurse Practitioner (Internal Medicine)  Extended Emergency Contact Information Primary Emergency Contact: Suszanne Conners Address: [address]          [place], [place] [address] [place] Phone: [phone] Relation: Spouse  Code Status:  Full Code Goals of care: Advanced Directive information Advanced Directives 02/12/2017  Does Patient Have a Medical Advance Directive? Yes  Type of Paramedic of [place];Living will  Does patient want to make changes to medical advance directive? No - Patient declined  Copy of [place] in Chart? No - copy requested  Would patient like information on creating a medical advance directive? -     Chief Complaint  Patient presents with  . Acute Visit    HPI:  Pt is a 71 y.o. male seen today for an acute visit for f/u hospitalization 02/11/17 to 02/14/17 for hepatic encephalopathy, ammonia level 125, takes Lactulose and Xifaxan per GI, maintain BMs 3-5 per day, f/u GI.   Hx of NASH Cirrhosis, taking Spironolactone 100mg  qd and Furosemide 20mg  bid, s/p Roux and Y gastric bypass, HTN, varices, abd hernia, pancytopenia, CKD, acidosis, on Bicarb.  Past Medical History:  Diagnosis Date  . Abdominal hernia   . Ascites   . Benign neoplasm of colon   . Cirrhosis ([place])   . Diabetes mellitus, type 2 ([place])    resolved with gastric bypass  . Diverticulosis of colon (without mention of hemorrhage)   . Esophageal varices ([place])   . Hyperlipemia   . Hypertension   . Iron deficiency anemia secondary to blood loss (chronic)   . Kidney stones   . Liver cyst   . Lymphopenia   . NASH (nonalcoholic steatohepatitis)   . Osteoarthritis of hip   . Renal cyst   .  Splenomegaly   . Unspecified sleep apnea    Resolved   Past Surgical History:  Procedure Laterality Date  . ANKLE SURGERY     Bil  . COLONOSCOPY  2012  . GASTRIC BYPASS  2005  . KNEE SURGERY     Bil  . POLYPECTOMY    . TOTAL HIP ARTHROPLASTY  2008   right    Allergies  Allergen Reactions  . Oxycodone Other (See Comments)    Reaction:  Caused pt to fall     Outpatient Encounter Prescriptions as of 02/18/2017  Medication Sig  . diphenoxylate-atropine (LOMOTIL) 2.5-0.025 MG tablet Take 1 tablet by mouth 4 (four) times daily as needed for diarrhea or loose stools.  . furosemide (LASIX) 20 MG tablet Take 1 tablet (20 mg total) by mouth 2 (two) times daily.  Marland Kitchen lactulose (CHRONULAC) 10 GM/15ML solution Take 45 mLs (30 g total) by mouth 2 (two) times daily.  . rifaximin (XIFAXAN) 550 MG TABS tablet Take 1 tablet (550 mg total) by mouth 2 (two) times daily.  . sodium bicarbonate 650 MG tablet Take 650 mg by mouth 2 (two) times daily.  Marland Kitchen spironolactone (ALDACTONE) 100 MG tablet Take 1 tablet (100 mg total) by mouth daily.   No facility-administered encounter medications on file as of 02/18/2017.     Review of Systems  Constitutional: Positive for activity change and fatigue. Negative for appetite change, chills, diaphoresis,  fever and unexpected weight change.  HENT: Negative for congestion, dental problem, drooling, ear discharge, ear pain, facial swelling, hearing loss, mouth sores, nosebleeds, postnasal drip, rhinorrhea, sinus pain, sinus pressure, sneezing, tinnitus and voice change.   Eyes: Negative for photophobia, discharge, redness and itching.  Respiratory: Negative for cough, chest tightness, shortness of breath and stridor.   Cardiovascular: Positive for leg swelling. Negative for chest pain and palpitations.  Gastrointestinal: Positive for abdominal distention. Negative for abdominal pain, anal bleeding, blood in stool, constipation, diarrhea, nausea and vomiting.       A  large lower abd hernia  Endocrine: Negative for cold intolerance, heat intolerance, polydipsia, polyphagia and polyuria.  Genitourinary: Negative for difficulty urinating, dysuria, flank pain, frequency and urgency.  Musculoskeletal: Positive for arthralgias and gait problem. Negative for myalgias.  Skin: Negative for color change, pallor and wound.  Allergic/Immunologic: Negative for environmental allergies and food allergies.  Neurological: Negative for dizziness, tremors, seizures, syncope, facial asymmetry, speech difficulty, weakness, light-headedness, numbness and headaches.  Hematological: Negative for adenopathy. Does not bruise/bleed easily.  Psychiatric/Behavioral: Negative for agitation, behavioral problems, confusion, decreased concentration, dysphoric mood, hallucinations and sleep disturbance. The patient is not nervous/anxious and is not hyperactive.      There is no immunization history on file for this patient. Pertinent  Health Maintenance Due  Topic Date Due  . FOOT EXAM  [birth year]29/1955  . OPHTHALMOLOGY EXAM  12/29/1955  . URINE MICROALBUMIN  12/29/1956  . PNA vac Low Risk Adult (1 of 2 - PCV13) 12/30/2011  . INFLUENZA VACCINE  05/07/2017  . HEMOGLOBIN A1C  06/22/2017  . COLONOSCOPY  08/03/2019   No flowsheet data found. Functional Status Survey:    There were no vitals filed for this visit. There is no height or weight on file to calculate BMI. Physical Exam  Constitutional: He appears well-developed and well-nourished. No distress.  HENT:  Head: Normocephalic and atraumatic.  Eyes: EOM are normal. Pupils are equal, round, and reactive to light. No scleral icterus.  Neck: Normal range of motion. Neck supple.  Cardiovascular: Normal rate and regular rhythm.   Pulmonary/Chest: Effort normal and breath sounds normal.  Abdominal: He exhibits distension, fluid wave and ascites. There is no tenderness. A hernia is present.    A large lower abd hernia, non reducible.    Musculoskeletal: Normal range of motion. He exhibits edema.  Trace edema BLE  Neurological: He is alert.  Skin: Skin is warm and dry.  Psychiatric: He has a normal mood and affect. His behavior is normal. Judgment and thought content normal.    Labs reviewed:  Recent Labs  02/12/17 0521 02/13/17 0507 02/14/17 0519  NA 140 139 136  K 3.8 4.0 4.1  CL 115* 116* 111  CO2 19* 20* 18*  GLUCOSE 177* 123* 137*  BUN 24* 23* 24*  CREATININE 1.46* 1.31* 1.33*  CALCIUM 8.2* 8.1* 8.2*    Recent Labs  02/12/17 0521 02/13/17 0507 02/14/17 0519  AST 32 35 38  ALT 27 30 33  ALKPHOS 112 111 111  BILITOT 1.4* 1.6* 1.5*  PROT 5.2* 5.0* 4.9*  ALBUMIN 3.0* 2.9* 2.8*    Recent Labs  11/13/16 1704  12/20/16 0137  12/23/16 0539  02/11/17 2005 02/12/17 0521 02/13/17 0507  WBC 8.2  < > 3.8*  < > 2.0*  < > 2.8* 2.2* 2.2*  NEUTROABS 8.0*  --  3.4  --  1.4*  --   --   --   --   HGB  10.8*  < > 10.7*  < > 8.8*  < > 11.2* 9.5* 9.9*  HCT 30.7*  < > 30.3*  < > 24.9*  < > 32.7* 27.7* 27.9*  MCV 88.7  < > 88.3  < > 85.0  < > 87.4 86.8 86.9  PLT 36*  < > 38*  < > 31*  < > 44* 39* 41*  < > = values in this interval not displayed. Lab Results  Component Value Date   TSH 1.146 02/14/2015   Lab Results  Component Value Date   HGBA1C 5.2 12/20/2016   No results found for: CHOL, HDL, LDLCALC, LDLDIRECT, TRIG, CHOLHDL  Significant Diagnostic Results in last 30 days:  Ct Head Wo Contrast  Result Date: 02/11/2017 CLINICAL DATA:  Altered mental status.  Confusion. EXAM: CT HEAD WITHOUT CONTRAST TECHNIQUE: Contiguous axial images were obtained from the base of the skull through the vertex without intravenous contrast. COMPARISON:  None. FINDINGS: Brain: No evidence of acute infarction, hemorrhage, hydrocephalus, extra-axial collection or mass lesion/mass effect. Vascular: Vascular calcifications are present. Skull: Normal. Negative for fracture or focal lesion. Sinuses/Orbits: No acute finding.  Other: None. IMPRESSION: No acute intracranial abnormalities. Electronically Signed   By: Lucienne Capers M.D.   On: 02/11/2017 21:52    Assessment/Plan Essential hypertension Controlled, continue Spironolactone 100mg  qd, Furosemide 20mg  bid  Liver cirrhosis secondary to NASH (HCC)  taking Spironolactone 100mg  qd and Furosemide 20mg  bid, s/p Roux and Y gastric bypass, takes Lactulose and Xifaxan per GI, maintain BMs 3-5 per day, f/u GI.    Diabetes mellitus type 2, controlled ([place]) Diet controlled. Hgb a1c 5.2 12/20/16  Encephalopathy, hepatic (HCC) At his baseline mentation.   CKD (chronic kidney disease) stage 4, GFR 15-29 ml/min (HCC) 02/14/17 creat 1.33  Thrombocytopenia (HCC) 02/14/17 wbc 2.2, Hgb 9.9, plt 41  Ascites Monitor abd girth, f/u GI  Abdominal hernia  A large lower abdominal hernia, non reducible, no pain, non surgical candidate.      Family/ staff Communication: IL when able.   Labs/tests ordered:  none

## 2017-02-18 NOTE — Assessment & Plan Note (Signed)
Diet controlled. Hgb a1c 5.2 12/20/16

## 2017-02-18 NOTE — Assessment & Plan Note (Signed)
A large lower abdominal hernia, non reducible, no pain, non surgical candidate.

## 2017-02-18 NOTE — Assessment & Plan Note (Signed)
Monitor abd girth, f/u GI

## 2017-02-18 NOTE — Assessment & Plan Note (Signed)
taking Spironolactone 100mg  qd and Furosemide 20mg  bid, s/p Roux and Y gastric bypass, takes Lactulose and Xifaxan per GI, maintain BMs 3-5 per day, f/u GI.

## 2017-02-18 NOTE — Assessment & Plan Note (Signed)
At his baseline mentation.

## 2017-02-18 NOTE — Assessment & Plan Note (Signed)
02/14/17 wbc 2.2, Hgb 9.9, plt 41

## 2017-02-20 ENCOUNTER — Non-Acute Institutional Stay (SKILLED_NURSING_FACILITY): Payer: PPO | Admitting: Internal Medicine

## 2017-02-20 ENCOUNTER — Encounter: Payer: Self-pay | Admitting: Internal Medicine

## 2017-02-20 DIAGNOSIS — E46 Unspecified protein-calorie malnutrition: Secondary | ICD-10-CM

## 2017-02-20 DIAGNOSIS — K7290 Hepatic failure, unspecified without coma: Secondary | ICD-10-CM

## 2017-02-20 DIAGNOSIS — N184 Chronic kidney disease, stage 4 (severe): Secondary | ICD-10-CM | POA: Diagnosis not present

## 2017-02-20 DIAGNOSIS — E872 Acidosis, unspecified: Secondary | ICD-10-CM

## 2017-02-20 DIAGNOSIS — K7581 Nonalcoholic steatohepatitis (NASH): Secondary | ICD-10-CM | POA: Diagnosis not present

## 2017-02-20 DIAGNOSIS — D61818 Other pancytopenia: Secondary | ICD-10-CM | POA: Diagnosis not present

## 2017-02-20 DIAGNOSIS — R188 Other ascites: Secondary | ICD-10-CM

## 2017-02-20 DIAGNOSIS — I959 Hypotension, unspecified: Secondary | ICD-10-CM | POA: Diagnosis not present

## 2017-02-20 DIAGNOSIS — E1022 Type 1 diabetes mellitus with diabetic chronic kidney disease: Secondary | ICD-10-CM

## 2017-02-20 DIAGNOSIS — K7682 Hepatic encephalopathy: Secondary | ICD-10-CM

## 2017-02-20 DIAGNOSIS — R531 Weakness: Secondary | ICD-10-CM

## 2017-02-20 NOTE — Progress Notes (Signed)
LOCATION: [facility]  PCP: Estill Dooms, MD   Code Status: Full Code  Goals of care: Advanced Directive information Advanced Directives 02/12/2017  Does Patient Have a Medical Advance Directive? Yes  Type of Paramedic of [place];Living will  Does patient want to make changes to medical advance directive? No - Patient declined  Copy of [place] in Chart? No - copy requested  Would patient like information on creating a medical advance directive? -       Extended Emergency Contact Information Primary Emergency Contact: Suszanne Conners Address: [place], [place] [address] [place] Phone: [phone] Relation: Spouse   Allergies  Allergen Reactions  . Oxycodone Other (See Comments)    Reaction:  Caused pt to fall     Chief Complaint  Patient presents with  . New Admit To SNF    New Admission Visit      HPI:  Patient is a 71 y.o. male seen today for short term rehabilitation post hospital admission from 02/11/17-02/14/17 with acute hepatic encephalopathy. He had elevated ammonia level and was placed on lactulose and xifaxan. He required bicarbonate for his acidosis. He is seen in his room today. He has PMH of NASH cirrhosis, CKD, gastric bypass, HTN, esophageal varices. He is seen in his room today. Per nursing he has been more confused lately.   Review of Systems:  Constitutional: Negative for fever, chills, diaphoresis. Feels weak and tired. HENT: Negative for headache, congestion, nasal discharge, sore throat, difficulty swallowing.   Eyes: Negative for eye pain, blurred vision, double vision and discharge. Glasses at bedside. Respiratory: Negative for cough, shortness of breath and wheezing.   Cardiovascular: Negative for chest pain, palpitations  Gastrointestinal: Negative for heartburn, nausea, vomiting, abdominal pain, loss of appetite. had 2 bowel movement  today. Genitourinary: Negative for dysuria.  Musculoskeletal: Negative for back pain, fall in the facility.  Skin: Negative for itching, rash.  Neurological: Negative for dizziness. Psychiatric/Behavioral: Negative for depression.   Past Medical History:  Diagnosis Date  . Abdominal hernia   . Ascites   . Benign neoplasm of colon   . Cirrhosis ([place])   . Diabetes mellitus, type 2 ([place])    resolved with gastric bypass  . Diverticulosis of colon (without mention of hemorrhage)   . Esophageal varices ([place])   . Hyperlipemia   . Hypertension   . Iron deficiency anemia secondary to blood loss (chronic)   . Kidney stones   . Liver cyst   . Lymphopenia   . NASH (nonalcoholic steatohepatitis)   . Osteoarthritis of hip   . Renal cyst   . Splenomegaly   . Unspecified sleep apnea    Resolved   Past Surgical History:  Procedure Laterality Date  . ANKLE SURGERY     Bil  . COLONOSCOPY  2012  . GASTRIC BYPASS  2005  . KNEE SURGERY     Bil  . POLYPECTOMY    . TOTAL HIP ARTHROPLASTY  2008   right   Social History:   reports that he has never smoked. He has never used smokeless tobacco. He reports that he does not drink alcohol or use drugs.  Family History  Problem Relation Age of Onset  . Heart attack Father        deceased due to MI  . Heart disease Father   . Diabetes Mother  deceased due to complications of DM  . Coronary artery disease Brother   . Colon cancer Neg Hx   . Stomach cancer Neg Hx   . Liver disease Neg Hx     Medications: Allergies as of 02/20/2017      Reactions   Oxycodone Other (See Comments)   Reaction:  Caused pt to fall       Medication List       Accurate as of 02/20/17  4:26 PM. Always use your most recent med list.          diphenoxylate-atropine 2.5-0.025 MG tablet Commonly known as:  LOMOTIL Take 1 tablet by mouth 4 (four) times daily as needed for diarrhea or loose stools.   furosemide 20 MG tablet Commonly known as:   LASIX Take 1 tablet (20 mg total) by mouth 2 (two) times daily.   lactulose 10 GM/15ML solution Commonly known as:  CHRONULAC Take 45 mLs (30 g total) by mouth 2 (two) times daily.   rifaximin 550 MG Tabs tablet Commonly known as:  XIFAXAN Take 1 tablet (550 mg total) by mouth 2 (two) times daily.   sodium bicarbonate 650 MG tablet Take 650 mg by mouth 2 (two) times daily.   spironolactone 100 MG tablet Commonly known as:  ALDACTONE Take 1 tablet (100 mg total) by mouth daily.       Immunizations:  There is no immunization history on file for this patient.   Physical Exam: Vitals:   02/20/17 1619  BP: (!) 100/51  Pulse: 81  Resp: 14  Temp: 97 F (36.1 C)  TempSrc: Oral  SpO2: 96%  Weight: 207 lb 6.4 oz (94.1 kg)  Height: 5\' 11"  (1.803 m)   Body mass index is 28.93 kg/m.  General- elderly male, well built, in no acute distress Head- normocephalic, atraumatic Nose- no nasal discharge Throat- moist mucus membrane, normal oropharynx  Eyes- PERRLA, EOMI, no pallor, no icterus, no discharge, normal conjunctiva, normal sclera Neck- no cervical lymphadenopathy Cardiovascular- normal s1,s2, no murmur Respiratory- bilateral clear to auscultation, no wheeze, no rhonchi, no crackles, no use of accessory muscles Abdomen- bowel sounds present, soft, distended, non tender, ascites present Musculoskeletal- able to move all 4 extremities, 1+ leg edema, able to move all 4 extremities Neurological- alert and oriented to self, time and place but mentions he had 3 friends in the room when I arrived to examine him (actually there was only 1 friend). Mild asterexis present.  Skin- warm and dry Psychiatry- normal mood and affect    Labs reviewed: Basic Metabolic Panel:  Recent Labs  02/12/17 0521 02/13/17 0507 02/14/17 0519  NA 140 139 136  K 3.8 4.0 4.1  CL 115* 116* 111  CO2 19* 20* 18*  GLUCOSE 177* 123* 137*  BUN 24* 23* 24*  CREATININE 1.46* 1.31* 1.33*  CALCIUM  8.2* 8.1* 8.2*   Liver Function Tests:  Recent Labs  02/12/17 0521 02/13/17 0507 02/14/17 0519  AST 32 35 38  ALT 27 30 33  ALKPHOS 112 111 111  BILITOT 1.4* 1.6* 1.5*  PROT 5.2* 5.0* 4.9*  ALBUMIN 3.0* 2.9* 2.8*    Recent Labs  12/20/16 0137  LIPASE 56*    Recent Labs  02/12/17 0521 02/13/17 0507 02/14/17 0519  AMMONIA 46* 64* 83*   CBC:  Recent Labs  11/13/16 1704  12/20/16 0137  12/23/16 0539  02/11/17 2005 02/12/17 0521 02/13/17 0507  WBC 8.2  < > 3.8*  < > 2.0*  < >  2.8* 2.2* 2.2*  NEUTROABS 8.0*  --  3.4  --  1.4*  --   --   --   --   HGB 10.8*  < > 10.7*  < > 8.8*  < > 11.2* 9.5* 9.9*  HCT 30.7*  < > 30.3*  < > 24.9*  < > 32.7* 27.7* 27.9*  MCV 88.7  < > 88.3  < > 85.0  < > 87.4 86.8 86.9  PLT 36*  < > 38*  < > 31*  < > 44* 39* 41*  < > = values in this interval not displayed. Cardiac Enzymes:  Recent Labs  12/20/16 0541  TROPONINI 0.04*   BNP: Invalid input(s): POCBNP CBG:  Recent Labs  11/16/16 1342 12/23/16 0432 02/11/17 1838  GLUCAP 119* 96 205*    Radiological Exams: Ct Head Wo Contrast  Result Date: 02/11/2017 CLINICAL DATA:  Altered mental status.  Confusion. EXAM: CT HEAD WITHOUT CONTRAST TECHNIQUE: Contiguous axial images were obtained from the base of the skull through the vertex without intravenous contrast. COMPARISON:  None. FINDINGS: Brain: No evidence of acute infarction, hemorrhage, hydrocephalus, extra-axial collection or mass lesion/mass effect. Vascular: Vascular calcifications are present. Skull: Normal. Negative for fracture or focal lesion. Sinuses/Orbits: No acute finding. Other: None. IMPRESSION: No acute intracranial abnormalities. Electronically Signed   By: Lucienne Capers M.D.   On: 02/11/2017 21:52    Assessment/Plan  Generalized weakness From deconditioning, Will have him work with physical therapy and occupational therapy team to help with gait training and muscle strengthening exercises.fall precautions.  Skin care. Encourage to be out of bed. If fails to improve, consider palliative care consult.    Hepatic encephalopathy Will increase his lactulose to 30 g/45 ml tid from bid with goal for 3-5 bowel movement to help eliminate ammonia. Check ammonia level. Continue rifaxamin. Make f/u with GI.   NASH liver cirrhosis Continue spironolactone and furosemide. Continue rifaxamin and lactulose new regimen. GI appointment  Hypotension Monitor BP closely with him being on diuretics. Orthostatic BP daily x 1 week. Ascites From liver cirrhosis, increasing, will likely need therapeutic tap to help symptomatically. Continue diuresis. Check bmp  Pancytopenia From liver cirrhosis. Monitor labs periodically. Monitor for bleed.   Protein calorie malnutrition RD consult, monitor po intake and weight.   Metabolic acidosis Continue bicarbonate bid for now. Check bmp  ckd stage 4 Monitor renal function  DM 2 with renal disease Lab Results  Component Value Date   HGBA1C 5.2 12/20/2016   Controlled diabetes, reviewed a1c. Monitor cbg.    Goals of care: short term rehabilitation   Labs/tests ordered: cbc, cmp, ammonia, u/a with c/s  Family/ staff Communication: reviewed care plan with patient and nursing supervisor    Blanchie Serve, MD Internal Medicine [place], [place] [address] Cell Phone (Monday-Friday 8 am - 5 pm): [phone] On Call: [phone] and follow prompts after 5 pm and on weekends Office Phone: [phone] Office Fax: [phone]

## 2017-02-21 ENCOUNTER — Encounter: Payer: Self-pay | Admitting: Internal Medicine

## 2017-02-21 LAB — BASIC METABOLIC PANEL
BUN: 25 mg/dL — AB (ref 4–21)
CREATININE: 1.6 mg/dL — AB (ref <=1.3)
Glucose: 104 mg/dL
POTASSIUM: 4.4 mmol/L (ref 3.4–5.3)
Sodium: 138 mmol/L (ref 137–147)

## 2017-02-21 LAB — HEPATIC FUNCTION PANEL
ALK PHOS: 100 U/L (ref 25–125)
ALT: 22 U/L (ref 10–40)
AST: 21 U/L (ref 14–40)
Bilirubin, Total: 1.2 mg/dL

## 2017-02-21 LAB — CBC AND DIFFERENTIAL
HCT: 27 % — AB (ref 41–53)
HEMOGLOBIN: 9.2 g/dL — AB (ref 13.5–17.5)
Platelets: 42 10*3/uL — AB (ref 150–399)
WBC: 1.9 10^3/mL

## 2017-02-22 ENCOUNTER — Encounter: Payer: Self-pay | Admitting: Internal Medicine

## 2017-02-24 LAB — BASIC METABOLIC PANEL
BUN: 28 mg/dL — AB (ref 4–21)
CREATININE: 1.8 mg/dL — AB (ref <=1.3)
GLUCOSE: 104 mg/dL
Potassium: 4.7 mmol/L (ref 3.4–5.3)
SODIUM: 139 mmol/L (ref 137–147)

## 2017-02-24 LAB — CBC AND DIFFERENTIAL
HEMATOCRIT: 28 % — AB (ref 41–53)
Hemoglobin: 9.4 g/dL — AB (ref 13.5–17.5)
Platelets: 37 10*3/uL — AB (ref 150–399)
WBC: 2 10^3/mL

## 2017-02-25 ENCOUNTER — Ambulatory Visit: Payer: PPO | Admitting: Internal Medicine

## 2017-02-25 ENCOUNTER — Other Ambulatory Visit: Payer: Self-pay | Admitting: *Deleted

## 2017-02-27 ENCOUNTER — Ambulatory Visit (INDEPENDENT_AMBULATORY_CARE_PROVIDER_SITE_OTHER): Payer: PPO | Admitting: Gastroenterology

## 2017-02-27 ENCOUNTER — Encounter: Payer: Self-pay | Admitting: Gastroenterology

## 2017-02-27 VITALS — BP 110/50 | HR 74 | Ht 71.5 in | Wt 197.0 lb

## 2017-02-27 DIAGNOSIS — D696 Thrombocytopenia, unspecified: Secondary | ICD-10-CM

## 2017-02-27 DIAGNOSIS — K7290 Hepatic failure, unspecified without coma: Secondary | ICD-10-CM

## 2017-02-27 DIAGNOSIS — K7460 Unspecified cirrhosis of liver: Secondary | ICD-10-CM | POA: Diagnosis not present

## 2017-02-27 DIAGNOSIS — K7581 Nonalcoholic steatohepatitis (NASH): Secondary | ICD-10-CM | POA: Diagnosis not present

## 2017-02-27 DIAGNOSIS — R188 Other ascites: Secondary | ICD-10-CM

## 2017-02-27 DIAGNOSIS — K7682 Hepatic encephalopathy: Secondary | ICD-10-CM

## 2017-02-27 NOTE — Progress Notes (Signed)
02/27/2017 [name] House [id number] [birth year]   HISTORY OF PRESENT ILLNESS:  This is a 71 year old male who is known to Dr. Henrene Pastor.  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  Was recently admitted to the hospital again from May 8 through May 11 for hepatic encephalopathy. He is receiving lactulose 45 mL 3 times daily, which is being held for greater than 5 bowel movements daily. He says that he is having about 3-4 soft BMs daily currently. He is now also on Xifaxan 550 mg twice a day.  In regards to his ascites, his weight is less than a pound more than when he was here at the beginning of April. He denies shortness of breath or being any more symptomatic from his ascites than he was previously. It appears that he is on spironolactone 100 mg daily, but does not appear that he is taking Lasix at this time. Question if this was discontinued by nephrology. There is a CNA here with him today who does not offer much information but does state that he has been doing well since his hospital discharge and seems to be at his baseline.   Past Medical History:  Diagnosis Date  . Abdominal hernia   . Ascites   . Benign neoplasm of colon   . Cirrhosis ([place])   . Diabetes mellitus, type 2 ([place])    resolved with gastric bypass  . Diverticulosis of colon (without mention of hemorrhage)   . Esophageal varices ([place])   . Hyperlipemia   . Hypertension   . Iron deficiency anemia secondary to blood loss (chronic)   . Kidney stones   . Liver cyst   . Lymphopenia   . NASH (nonalcoholic steatohepatitis)   . Osteoarthritis of hip   . Renal cyst   . Splenomegaly   . Unspecified sleep apnea    Resolved   Past Surgical History:  Procedure Laterality Date  . ANKLE SURGERY     Bil  . COLONOSCOPY  2012  . GASTRIC BYPASS  2005  . KNEE SURGERY     Bil  . POLYPECTOMY    . TOTAL HIP  ARTHROPLASTY  2008   right    reports that he has never smoked. He has never used smokeless tobacco. He reports that he does not drink alcohol or use drugs. family history includes Coronary artery disease in his brother; Diabetes in his mother; Heart attack in his father; Heart disease in his father. Allergies  Allergen Reactions  . Oxycodone Other (See Comments)    Reaction:  Caused pt to fall       Outpatient Encounter Prescriptions as of 02/27/2017  Medication Sig  . diphenoxylate-atropine (LOMOTIL) 2.5-0.025 MG tablet Take 1 tablet by mouth 4 (four) times daily as needed for diarrhea or loose stools.  . furosemide (LASIX) 20 MG tablet Take 1 tablet (20 mg total) by mouth 2 (two) times daily.  Marland Kitchen lactulose (CHRONULAC) 10 GM/15ML solution Take 45 mLs (30 g total) by mouth 2 (two) times daily.  . sodium bicarbonate 650 MG tablet Take 650 mg by mouth 2 (two) times daily.  Marland Kitchen spironolactone (ALDACTONE) 100 MG tablet Take 1 tablet (100 mg total) by mouth daily.  . [DISCONTINUED] rifaximin (XIFAXAN) 550 MG TABS tablet Take 1 tablet (550 mg total) by mouth 2 (two) times daily.   No facility-administered encounter medications on file as of 02/27/2017.  REVIEW OF SYSTEMS  : All other systems reviewed and negative except where noted in the History of Present Illness.   PHYSICAL EXAM: BP (!) 110/50   Pulse 74   Ht 5' 11.5" (1.816 m)   Wt 197 lb (89.4 kg)   BMI 27.09 kg/m  General:  Chronically ill-appearing white male in no acute distress Head: Normocephalic and atraumatic Eyes:  Sclerae anicteric, conjunctiva pink. Ears: Normal auditory acuity Lungs: Clear throughout to auscultation; no increased WOB. Heart: Regular rate and rhythm Abdomen: Soft, softly distended with ascites fluid. Hyperactive bowel sounds.  Non-tender. Musculoskeletal: Symmetrical with no gross deformities  Skin: No lesions on visible extremities Extremities: 1+ pitting edema in B/L LE's. Neurological: Alert  oriented x 4, grossly non-focal; no asterixis  Psychological:  Alert and cooperative. Normal mood and affect  ASSESSMENT AND PLAN: #1. NASH cirrhosis with portal hypertension #2. Ascites and anasarca secondary to cirrhosis #3. 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 Chronic renal insufficiency #5. Thrombocytopenia secondary to cirrhosis. #6. Status post Roux-en-Y gastric bypass surgery 2005 #7. History of adenomatous colon polyps  *Quite honestly he appears stable.  HE much better on current regimen Lactulose 45 mL 3 times a day and Xifaxan 550 mg twice daily.  In regards to his ascites, his weight is less than a pound more than when he was here at the beginning of April. He is not short of breath from the ascites and is no more symptomatic than he has ever been. I think that we can hold off with paracentesis for now. Continues spironolactone 100 mg daily.  Continue 2 gram sodium diet.  Has another follow-up with Dr. Henrene Pastor in June.     CC:  Estill Dooms, MD

## 2017-02-27 NOTE — Progress Notes (Signed)
Agree with assessment and plans. Thanks [employer]

## 2017-02-27 NOTE — Patient Instructions (Signed)
Follow a 2 gram sodium diet.  Continue you medicaions as you are currently taking them.  No paracentesis needed at this time.   Keep your appointment with Dr. Scarlette Shorts for 03-19-2017 at 2:45 PM here at [facility].

## 2017-03-07 ENCOUNTER — Encounter: Payer: Self-pay | Admitting: Nurse Practitioner

## 2017-03-07 ENCOUNTER — Non-Acute Institutional Stay (SKILLED_NURSING_FACILITY): Payer: PPO | Admitting: Nurse Practitioner

## 2017-03-07 DIAGNOSIS — K7460 Unspecified cirrhosis of liver: Secondary | ICD-10-CM | POA: Diagnosis not present

## 2017-03-07 DIAGNOSIS — D696 Thrombocytopenia, unspecified: Secondary | ICD-10-CM

## 2017-03-07 DIAGNOSIS — R188 Other ascites: Secondary | ICD-10-CM | POA: Diagnosis not present

## 2017-03-07 DIAGNOSIS — D509 Iron deficiency anemia, unspecified: Secondary | ICD-10-CM | POA: Diagnosis not present

## 2017-03-07 DIAGNOSIS — K7290 Hepatic failure, unspecified without coma: Secondary | ICD-10-CM

## 2017-03-07 DIAGNOSIS — K7581 Nonalcoholic steatohepatitis (NASH): Secondary | ICD-10-CM | POA: Diagnosis not present

## 2017-03-07 DIAGNOSIS — I10 Essential (primary) hypertension: Secondary | ICD-10-CM

## 2017-03-07 DIAGNOSIS — K7682 Hepatic encephalopathy: Secondary | ICD-10-CM

## 2017-03-07 DIAGNOSIS — N184 Chronic kidney disease, stage 4 (severe): Secondary | ICD-10-CM | POA: Diagnosis not present

## 2017-03-07 DIAGNOSIS — R531 Weakness: Secondary | ICD-10-CM | POA: Diagnosis not present

## 2017-03-07 DIAGNOSIS — E119 Type 2 diabetes mellitus without complications: Secondary | ICD-10-CM | POA: Diagnosis not present

## 2017-03-07 NOTE — Assessment & Plan Note (Signed)
Controlled, continue Spironolactone 100mg  qd, Furosemide 20mg  bid

## 2017-03-07 NOTE — Assessment & Plan Note (Signed)
02/24/17 Bun 28, creat 1.83

## 2017-03-07 NOTE — Assessment & Plan Note (Addendum)
taking Spironolactone 100mg  qd and Furosemide 20mg  bid, s/p Roux and Y gastric bypass, takes Lactulose and Xifaxan per GI, maintain BMs 3-5 per day, f/u GI.   02/21/17 Na 138, K 4.4, Bun 25, creat 1.60, TP 4.7, albumin 2.7, wbc 1.9, Hgb 9.2, plt 42, AMMONIA 11.8(7.5-12.5) 02/24/17 nA 139, K 4.7, Bun 28, creat 1.83, wbc 2.0, Hgb 9.4, plt 37 02/27/17 GI 2gm Na diet, no paracentesis, f/u Dr Henrene Pastor in June.

## 2017-03-07 NOTE — Assessment & Plan Note (Signed)
Hgb 9.4 02/24/17

## 2017-03-07 NOTE — Progress Notes (Signed)
Location:  [facility] Room Number: 35 Place of Service:  SNF (31) Provider: Lulia Schriner, Manxie  NP  Estill Dooms, MD  Patient Care Team: Estill Dooms, MD as PCP - General (Internal Medicine) Rishit Burkhalter X, NP as Nurse Practitioner (Internal Medicine)  Extended Emergency Contact Information Primary Emergency Contact: Suszanne Conners Address: [address]          [place], [place] [address] [place] Phone: [phone] Relation: Spouse  Code Status:  Full Code Goals of care: Advanced Directive information Advanced Directives 03/07/2017  Does Patient Have a Medical Advance Directive? Yes  Type of Paramedic of [place];Living will  Does patient want to make changes to medical advance directive? No - Patient declined  Copy of [place] in Chart? Yes  Would patient like information on creating a medical advance directive? -     Chief Complaint  Patient presents with  . Medical Management of Chronic Issues    HPI:  Pt is a 71 y.o. male seen today for depressive mood per staff's request. MDS 02/21/17 showed the patient presented symptoms of little interest or pleasure in doing things, feeling down, depressed, or hopeless, trouble falling or staying asleep, or sleeping too much, feeling tired or having little energy, trouble concentrating on things, such as reading the newspaper or watching television, moving or speaking so slowly that other people could have noticed. More than half or more of the days. 03/07/17 the patient stated he is tired from his chronic medical condition, but he sleeps, eats as usual, no crying episodes, doesn't feel sad, declined antidepressant.    Past Medical History:  Diagnosis Date  . Abdominal hernia   . Ascites   . Benign neoplasm of colon   . Cirrhosis ([place])   . Diabetes mellitus, type 2 ([place])    resolved with gastric bypass  . Diverticulosis of colon (without mention  of hemorrhage)   . Esophageal varices ([place])   . Hyperlipemia   . Hypertension   . Iron deficiency anemia secondary to blood loss (chronic)   . Kidney stones   . Liver cyst   . Lymphopenia   . NASH (nonalcoholic steatohepatitis)   . Osteoarthritis of hip   . Renal cyst   . Splenomegaly   . Unspecified sleep apnea    Resolved   Past Surgical History:  Procedure Laterality Date  . ANKLE SURGERY     Bil  . COLONOSCOPY  2012  . GASTRIC BYPASS  2005  . KNEE SURGERY     Bil  . POLYPECTOMY    . TOTAL HIP ARTHROPLASTY  2008   right    Allergies  Allergen Reactions  . Oxycodone Other (See Comments)    Reaction:  Caused pt to fall     Outpatient Encounter Prescriptions as of 03/07/2017  Medication Sig  . diphenoxylate-atropine (LOMOTIL) 2.5-0.025 MG tablet Take 1 tablet by mouth 4 (four) times daily as needed for diarrhea or loose stools.  . furosemide (LASIX) 20 MG tablet Take 1 tablet (20 mg total) by mouth 2 (two) times daily.  Marland Kitchen lactulose (CHRONULAC) 10 GM/15ML solution Take 45 mLs (30 g total) by mouth 2 (two) times daily.  . Multiple Vitamin (MULTIVITAMIN) capsule Take 1 capsule by mouth daily.  . sodium bicarbonate 650 MG tablet Take 650 mg by mouth 2 (two) times daily.  Marland Kitchen spironolactone (ALDACTONE) 100 MG tablet Take 1 tablet (100 mg total) by mouth  daily.   No facility-administered encounter medications on file as of 03/07/2017.     Review of Systems  Constitutional: Positive for activity change and fatigue. Negative for appetite change, chills, diaphoresis, fever and unexpected weight change.  HENT: Negative for congestion, dental problem, drooling, ear discharge, ear pain, facial swelling, hearing loss, mouth sores, nosebleeds, postnasal drip, rhinorrhea, sinus pain, sinus pressure, sneezing, tinnitus and voice change.   Eyes: Negative for photophobia, discharge, redness and itching.  Respiratory: Negative for cough, chest tightness, shortness of breath and stridor.     Cardiovascular: Positive for leg swelling. Negative for chest pain and palpitations.  Gastrointestinal: Positive for abdominal distention. Negative for abdominal pain, anal bleeding, blood in stool, constipation, diarrhea, nausea and vomiting.       A large lower abd hernia  Endocrine: Negative for cold intolerance, heat intolerance, polydipsia, polyphagia and polyuria.  Genitourinary: Negative for difficulty urinating, dysuria, flank pain, frequency and urgency.  Musculoskeletal: Positive for arthralgias and gait problem. Negative for myalgias.  Skin: Negative for color change, pallor and wound.  Allergic/Immunologic: Negative for environmental allergies and food allergies.  Neurological: Negative for dizziness, tremors, seizures, syncope, facial asymmetry, speech difficulty, weakness, light-headedness, numbness and headaches.  Hematological: Negative for adenopathy. Does not bruise/bleed easily.  Psychiatric/Behavioral: Negative for agitation, behavioral problems, confusion, decreased concentration, dysphoric mood, hallucinations and sleep disturbance. The patient is not nervous/anxious and is not hyperactive.      There is no immunization history on file for this patient. Pertinent  Health Maintenance Due  Topic Date Due  . FOOT EXAM  12/29/1955  . OPHTHALMOLOGY EXAM  12[birth year]29/1955  . URINE MICROALBUMIN  12[birth year]29/1956  . PNA vac Low Risk Adult (1 of 2 - PCV13) 12/30/2011  . INFLUENZA VACCINE  05/07/2017  . HEMOGLOBIN A1C  06/22/2017  . COLONOSCOPY  08/03/2019   No flowsheet data found. Functional Status Survey:    Vitals:   03/07/17 1521  BP: 127/63  Pulse: 78  Resp: 15  Temp: 97.9 F (36.6 C)  SpO2: 97%  Weight: 185 lb (83.9 kg)  Height: 5' 11.5" (1.816 m)   Body mass index is 25.44 kg/m. Physical Exam  Constitutional: He appears well-developed and well-nourished. No distress.  HENT:  Head: Normocephalic and atraumatic.  Eyes: EOM are normal. Pupils are equal, round, and  reactive to light. No scleral icterus.  Neck: Normal range of motion. Neck supple.  Cardiovascular: Normal rate and regular rhythm.   Pulmonary/Chest: Effort normal and breath sounds normal.  Abdominal: He exhibits distension, fluid wave and ascites. There is no tenderness. A hernia is present.    A large lower abd hernia, non reducible.   Musculoskeletal: Normal range of motion. He exhibits edema.  Trace edema BLE  Neurological: He is alert.  Skin: Skin is warm and dry.  Psychiatric: His behavior is normal. Judgment and thought content normal.    Labs reviewed:  Recent Labs  02/12/17 0521 02/13/17 0507 02/14/17 0519 02/21/17 02/24/17  NA 140 139 136 138 139  K 3.8 4.0 4.1 4.4 4.7  CL 115* 116* 111  --   --   CO2 19* 20* 18*  --   --   GLUCOSE 177* 123* 137*  --   --   BUN 24* 23* 24* 25* 28*  CREATININE 1.46* 1.31* 1.33* 1.6* 1.8*  CALCIUM 8.2* 8.1* 8.2*  --   --     Recent Labs  02/12/17 0521 02/13/17 0507 02/14/17 0519 02/21/17  AST 32 35 38 21  ALT 27 30 33 22  ALKPHOS 112 111 111 100  BILITOT 1.4* 1.6* 1.5*  --   PROT 5.2* 5.0* 4.9*  --   ALBUMIN 3.0* 2.9* 2.8*  --     Recent Labs  11/13/16 1704  12/20/16 0137  12/23/16 0539  02/11/17 2005 02/12/17 0521 02/13/17 0507 02/21/17 02/24/17  WBC 8.2  < > 3.8*  < > 2.0*  < > 2.8* 2.2* 2.2* 1.9 2.0  NEUTROABS 8.0*  --  3.4  --  1.4*  --   --   --   --   --   --   HGB 10.8*  < > 10.7*  < > 8.8*  < > 11.2* 9.5* 9.9* 9.2* 9.4*  HCT 30.7*  < > 30.3*  < > 24.9*  < > 32.7* 27.7* 27.9* 27* 28*  MCV 88.7  < > 88.3  < > 85.0  < > 87.4 86.8 86.9  --   --   PLT 36*  < > 38*  < > 31*  < > 44* 39* 41* 42* 37*  < > = values in this interval not displayed. Lab Results  Component Value Date   TSH 1.146 02/14/2015   Lab Results  Component Value Date   HGBA1C 5.2 12/20/2016   No results found for: CHOL, HDL, LDLCALC, LDLDIRECT, TRIG, CHOLHDL  Significant Diagnostic Results in last 30 days:  Ct Head Wo  Contrast  Result Date: 02/11/2017 CLINICAL DATA:  Altered mental status.  Confusion. EXAM: CT HEAD WITHOUT CONTRAST TECHNIQUE: Contiguous axial images were obtained from the base of the skull through the vertex without intravenous contrast. COMPARISON:  None. FINDINGS: Brain: No evidence of acute infarction, hemorrhage, hydrocephalus, extra-axial collection or mass lesion/mass effect. Vascular: Vascular calcifications are present. Skull: Normal. Negative for fracture or focal lesion. Sinuses/Orbits: No acute finding. Other: None. IMPRESSION: No acute intracranial abnormalities. Electronically Signed   By: Lucienne Capers M.D.   On: 02/11/2017 21:52    Assessment/Plan Generalized weakness depressive mood per staff's request. MDS 02/21/17 showed the patient presented symptoms of little interest or pleasure in doing things, feeling down, depressed, or hopeless, trouble falling or staying asleep, or sleeping too much, feeling tired or having little energy, trouble concentrating on things, such as reading the newspaper or watching television, moving or speaking so slowly that other people could have noticed. More than half or more of the days. 03/07/17 the patient stated he is tired from his chronic medical condition, but he sleeps, eats as usual, no crying episodes, doesn't feel sad, declined antidepressant.    Essential hypertension Controlled, continue Spironolactone 100mg  qd, Furosemide 20mg  bid  Liver cirrhosis secondary to NASH (HCC) taking Spironolactone 100mg  qd and Furosemide 20mg  bid, s/p Roux and Y gastric bypass, takes Lactulose and Xifaxan per GI, maintain BMs 3-5 per day, f/u GI.   02/21/17 Na 138, K 4.4, Bun 25, creat 1.60, TP 4.7, albumin 2.7, wbc 1.9, Hgb 9.2, plt 42, AMMONIA 11.8(7.5-12.5) 02/24/17 nA 139, K 4.7, Bun 28, creat 1.83, wbc 2.0, Hgb 9.4, plt 37 02/27/17 GI 2gm Na diet, no paracentesis, f/u Dr Henrene Pastor in June.  Diabetes mellitus type 2, controlled ([place]) Diet controlled. Hgb a1c  5.2 12/20/16  Hepatic encephalopathy (HCC) Baseline mentation   CKD (chronic kidney disease) stage 4, GFR 15-29 ml/min (HCC) 02/24/17 Bun 28, creat 1.83  Thrombocytopenia (HCC) 02/24/17 plt 37  Iron deficiency anemia Hgb 9.4 02/24/17  Ascites Monitor abd girth, f/u GI     Family/ staff  Communication: [place] when he is able.   Labs/tests ordered:  none

## 2017-03-07 NOTE — Assessment & Plan Note (Signed)
Baseline mentation.  

## 2017-03-07 NOTE — Assessment & Plan Note (Signed)
Diet controlled. Hgb a1c 5.2 12/20/16

## 2017-03-07 NOTE — Assessment & Plan Note (Signed)
02/24/17 plt 37

## 2017-03-07 NOTE — Assessment & Plan Note (Signed)
depressive mood per staff's request. MDS 02/21/17 showed the patient presented symptoms of little interest or pleasure in doing things, feeling down, depressed, or hopeless, trouble falling or staying asleep, or sleeping too much, feeling tired or having little energy, trouble concentrating on things, such as reading the newspaper or watching television, moving or speaking so slowly that other people could have noticed. More than half or more of the days. 03/07/17 the patient stated he is tired from his chronic medical condition, but he sleeps, eats as usual, no crying episodes, doesn't feel sad, declined antidepressant.

## 2017-03-07 NOTE — Assessment & Plan Note (Signed)
Monitor abd girth, f/u GI

## 2017-03-11 ENCOUNTER — Encounter: Payer: Self-pay | Admitting: Family

## 2017-03-11 ENCOUNTER — Non-Acute Institutional Stay (SKILLED_NURSING_FACILITY): Payer: PPO | Admitting: Family

## 2017-03-11 DIAGNOSIS — F341 Dysthymic disorder: Secondary | ICD-10-CM | POA: Diagnosis not present

## 2017-03-11 DIAGNOSIS — F329 Major depressive disorder, single episode, unspecified: Secondary | ICD-10-CM

## 2017-03-11 MED ORDER — SERTRALINE HCL 25 MG PO TABS: 25.0000 mg | ORAL_TABLET | Freq: Every day | ORAL | Status: DC

## 2017-03-11 NOTE — Progress Notes (Addendum)
Location:  [facility] Room Number: 35 Place of Service:  SNF (31) Provider: Rubby Barbary FNP-C  Blanchie Serve, MD  Patient Care Team: Blanchie Serve, MD as PCP - General (Internal Medicine) Mast, Man X, NP as Nurse Practitioner (Internal Medicine)  Extended Emergency Contact Information Primary Emergency Contact: Suszanne Conners Address: [address], [place] [address] [place] Phone: [phone] Relation: Spouse  Code Status: Full Code  Goals of care: Advanced Directive information Advanced Directives 03/11/2017  Does Patient Have a Medical Advance Directive? Yes  Type of Paramedic of [place];Living will  Does patient want to make changes to medical advance directive? -  Copy of [place] in Chart? -  Would patient like information on creating a medical advance directive? -     Chief Complaint  Patient presents with  . Acute Visit    Depression-agreeable to meds/treatment     HPI:  Pt is a 71 y.o. male seen today at [facility] for an acute visit for evaluation of worsening depression. He has a medical history of HTN, Hyperlipidemia,NASH cirrhosis, OA, chronic anemia among other conditions.He is seen in his room today per facility Nurse request states patient has had worsening depression unable to participate with therapy. He states no interest in doing things feeling down at the time. " Just want to stay in the room with the door closed". He admits to feeling depressed and willing to start on an antidepressant. He state was taking antidepressant in the past but does not recall the name of the medication. He denies any suicide ideation,fever, chills or cough.    Past Medical History:  Diagnosis Date  . Abdominal hernia   . Ascites   . Benign neoplasm of colon   . Cirrhosis ([place])   . Diabetes mellitus, type 2 ([place])    resolved with gastric bypass  .  Diverticulosis of colon (without mention of hemorrhage)   . Esophageal varices ([place])   . Hyperlipemia   . Hypertension   . Iron deficiency anemia secondary to blood loss (chronic)   . Kidney stones   . Liver cyst   . Lymphopenia   . NASH (nonalcoholic steatohepatitis)   . Osteoarthritis of hip   . Renal cyst   . Splenomegaly   . Unspecified sleep apnea    Resolved   Past Surgical History:  Procedure Laterality Date  . ANKLE SURGERY     Bil  . COLONOSCOPY  2012  . GASTRIC BYPASS  2005  . KNEE SURGERY     Bil  . POLYPECTOMY    . TOTAL HIP ARTHROPLASTY  2008   right    Allergies  Allergen Reactions  . Oxycodone Other (See Comments)    Reaction:  Caused pt to fall     Allergies as of 03/11/2017      Reactions   Oxycodone Other (See Comments)   Reaction:  Caused pt to fall       Medication List       Accurate as of 03/11/17  1:00 PM. Always use your most recent med list.          diphenoxylate-atropine 2.5-0.025 MG tablet Commonly known as:  LOMOTIL Take 1 tablet by mouth 4 (four) times daily as needed for diarrhea or loose stools.   furosemide 20 MG tablet Commonly known as:  LASIX Take 1 tablet (20 mg total)  by mouth 2 (two) times daily.   LACTULOSE PO Take 45 mLs by mouth 3 (three) times daily. Hold if >5 BM/day   multivitamin with minerals tablet Take 1 tablet by mouth daily.   rifaximin 550 MG Tabs tablet Commonly known as:  XIFAXAN Take 550 mg by mouth 2 (two) times daily.   sertraline 25 MG tablet Commonly known as:  ZOLOFT Take 1 tablet (25 mg total) by mouth at bedtime.   sodium bicarbonate 650 MG tablet Take 650 mg by mouth 2 (two) times daily.   spironolactone 100 MG tablet Commonly known as:  ALDACTONE Take 1 tablet (100 mg total) by mouth daily.       Review of Systems  Constitutional: Positive for activity change and fatigue. Negative for appetite change, chills and fever.  HENT: Negative for congestion, rhinorrhea, sinus pain,  sinus pressure, sneezing and sore throat.   Eyes: Negative.   Respiratory: Negative for cough, chest tightness, shortness of breath and wheezing.   Cardiovascular: Negative for chest pain, palpitations and leg swelling.  Gastrointestinal: Positive for abdominal distention. Negative for abdominal pain, constipation, diarrhea, nausea and vomiting.  Endocrine: Negative.   Genitourinary: Negative for dysuria, flank pain, frequency and urgency.  Musculoskeletal: Positive for gait problem.  Skin: Negative for color change, pallor and rash.  Neurological: Negative for dizziness, seizures, light-headedness and headaches.  Hematological: Does not bruise/bleed easily.  Psychiatric/Behavioral: Negative for agitation, confusion, hallucinations, sleep disturbance and suicidal ideas. The patient is not nervous/anxious.       There is no immunization history on file for this patient. Pertinent  Health Maintenance Due  Topic Date Due  . FOOT EXAM  12[birth year]29/1955  . OPHTHALMOLOGY EXAM  12[birth year]29/1956  . URINE MICROALBUMIN  12[birth year]29/1956  . PNA vac Low Risk Adult (1 of 2 - PCV13) 12/30/2011  . INFLUENZA VACCINE  05/07/2017  . HEMOGLOBIN A1C  06/22/2017  . COLONOSCOPY  08/03/2019    Vitals:   03/11/17 0932  BP: 139/62  Pulse: 80  Resp: 19  Temp: 98.3 F (36.8 C)  SpO2: 98%  Weight: 183 lb (83 kg)  Height: 5' 11.5" (1.816 m)   Body mass index is 25.17 kg/m. Physical Exam  Constitutional: He is oriented to person, place, and time.  Elderly in no acute ditress  HENT:  Head: Normocephalic.  Mouth/Throat: Oropharynx is clear and moist. No oropharyngeal exudate.  Eyes: Conjunctivae and EOM are normal. Pupils are equal, round, and reactive to light. Right eye exhibits no discharge. Left eye exhibits no discharge. No scleral icterus.  Neck: Normal range of motion. No JVD present. No thyromegaly present.  Cardiovascular: Normal rate, regular rhythm, normal heart sounds and intact distal pulses.  Exam  reveals no gallop and no friction rub.   No murmur heard. Pulmonary/Chest: Effort normal and breath sounds normal. No respiratory distress. He has no wheezes. He has no rales.  Abdominal: Soft. Bowel sounds are normal. He exhibits distension. There is no tenderness. There is no rebound and no guarding.  Musculoskeletal: He exhibits no edema, tenderness or deformity.  Moves x 4 extremities. Unsteady gait. Generalized weakness   Lymphadenopathy:    He has no cervical adenopathy.  Neurological: He is oriented to person, place, and time.  Skin: Skin is warm and dry. No rash noted. No erythema. No pallor.  Psychiatric:  Flat affect. Depressed    Labs reviewed:  Recent Labs  02/12/17 0521 02/13/17 0507 02/14/17 0519 02/21/17 02/24/17  NA 140 139 136 138 139  K  3.8 4.0 4.1 4.4 4.7  CL 115* 116* 111  --   --   CO2 19* 20* 18*  --   --   GLUCOSE 177* 123* 137*  --   --   BUN 24* 23* 24* 25* 28*  CREATININE 1.46* 1.31* 1.33* 1.6* 1.8*  CALCIUM 8.2* 8.1* 8.2*  --   --     Recent Labs  02/12/17 0521 02/13/17 0507 02/14/17 0519 02/21/17  AST 32 35 38 21  ALT 27 30 33 22  ALKPHOS 112 111 111 100  BILITOT 1.4* 1.6* 1.5*  --   PROT 5.2* 5.0* 4.9*  --   ALBUMIN 3.0* 2.9* 2.8*  --     Recent Labs  11/13/16 1704  12/20/16 0137  12/23/16 0539  02/11/17 2005 02/12/17 0521 02/13/17 0507 02/21/17 02/24/17  WBC 8.2  < > 3.8*  < > 2.0*  < > 2.8* 2.2* 2.2* 1.9 2.0  NEUTROABS 8.0*  --  3.4  --  1.4*  --   --   --   --   --   --   HGB 10.8*  < > 10.7*  < > 8.8*  < > 11.2* 9.5* 9.9* 9.2* 9.4*  HCT 30.7*  < > 30.3*  < > 24.9*  < > 32.7* 27.7* 27.9* 27* 28*  MCV 88.7  < > 88.3  < > 85.0  < > 87.4 86.8 86.9  --   --   PLT 36*  < > 38*  < > 31*  < > 44* 39* 41* 42* 37*  < > = values in this interval not displayed. Lab Results  Component Value Date   TSH 1.146 02/14/2015   Lab Results  Component Value Date   HGBA1C 5.2 12/20/2016   Assessment/Plan  Major depression,  chronic Worsening symptoms of depression. Previously on antidepressant but cannot recall the name of medication. He is willing to start on new medication. Start Sertraline 25 mg Tablet one by mouth at bedtime. Continue to monitor for mood changes.   Family/ staff Communication: Reviewed plan of care with patient and facility Nurse supervisor  Labs/tests ordered: CMP  03/11/2017.   Sandrea Hughs, NP

## 2017-03-19 ENCOUNTER — Encounter: Payer: Self-pay | Admitting: Internal Medicine

## 2017-03-19 ENCOUNTER — Other Ambulatory Visit (INDEPENDENT_AMBULATORY_CARE_PROVIDER_SITE_OTHER): Payer: PPO

## 2017-03-19 ENCOUNTER — Ambulatory Visit (INDEPENDENT_AMBULATORY_CARE_PROVIDER_SITE_OTHER): Payer: PPO | Admitting: Internal Medicine

## 2017-03-19 VITALS — BP 102/50 | HR 64 | Ht 71.0 in | Wt 177.0 lb

## 2017-03-19 DIAGNOSIS — K7581 Nonalcoholic steatohepatitis (NASH): Secondary | ICD-10-CM | POA: Diagnosis not present

## 2017-03-19 DIAGNOSIS — K7460 Unspecified cirrhosis of liver: Secondary | ICD-10-CM | POA: Diagnosis not present

## 2017-03-19 DIAGNOSIS — K7290 Hepatic failure, unspecified without coma: Secondary | ICD-10-CM

## 2017-03-19 DIAGNOSIS — K7682 Hepatic encephalopathy: Secondary | ICD-10-CM

## 2017-03-19 LAB — BASIC METABOLIC PANEL
BUN: 37 mg/dL — ABNORMAL HIGH (ref 6–23)
CHLORIDE: 106 meq/L (ref 96–112)
CO2: 24 meq/L (ref 19–32)
CREATININE: 1.85 mg/dL — AB (ref 0.40–1.50)
Calcium: 8.8 mg/dL (ref 8.4–10.5)
GFR: 38.58 mL/min — ABNORMAL LOW (ref >60.00)
Glucose, Bld: 232 mg/dL — ABNORMAL HIGH (ref 70–99)
POTASSIUM: 5.2 meq/L — AB (ref 3.5–5.1)
Sodium: 136 mEq/L (ref 135–145)

## 2017-03-19 NOTE — Progress Notes (Signed)
HISTORY OF PRESENT ILLNESS:  [name] House is a 71 y.o. male with NASH cirrhosis compensated by portal hypertension (nonbleeding grade 2 varices, ascites, anasarca,) and hepatic encephalopathy. Also status post Roux-en-Y gastric bypass surgery in 2005. Has had progressive liver disease over the past year. Hospitalized with hepatic encephalopathy and me. Last seen by the GI physician assistant later that month. Currently residing in a skilled nursing facility. Here today for routine follow-up. Last basic metabolic panel reveals stable chronic renal insufficiency with creatinine 1.8. Currently on Aldactone 100 mg daily and Lasix 40 mg daily for control of fluid. Taking lactulose to achieve 3-5 bowel movements and Xifaxan 550 mg twice daily for hepatic encephalopathy. Patient has had no clinical problems since his last visit. No apparent issues with encephalopathy, edema, or bleeding. He has had significant decrease in his weight since his last visit. His weight last month was 197 pounds. Today it is 177 pounds.  REVIEW OF SYSTEMS:  All non-GI ROS negative except for fatigue, weakness  Past Medical History:  Diagnosis Date  . Abdominal hernia   . Ascites   . Benign neoplasm of colon   . Cirrhosis ([place])   . Diabetes mellitus, type 2 ([place])    resolved with gastric bypass  . Diverticulosis of colon (without mention of hemorrhage)   . Esophageal varices ([place])   . Hyperlipemia   . Hypertension   . Iron deficiency anemia secondary to blood loss (chronic)   . Kidney stones   . Liver cyst   . Lymphopenia   . NASH (nonalcoholic steatohepatitis)   . Osteoarthritis of hip   . Renal cyst   . Splenomegaly   . Unspecified sleep apnea    Resolved    Past Surgical History:  Procedure Laterality Date  . ANKLE SURGERY     Bil  . COLONOSCOPY  2012  . GASTRIC BYPASS  2005  . KNEE SURGERY     Bil  . POLYPECTOMY    . TOTAL HIP ARTHROPLASTY  2008   right    Social History [name] House  reports  that he has never smoked. He has never used smokeless tobacco. He reports that he does not drink alcohol or use drugs.  family history includes Coronary artery disease in his brother; Diabetes in his mother; Heart attack in his father; Heart disease in his father.  Allergies  Allergen Reactions  . Oxycodone Other (See Comments)    Reaction:  Caused pt to fall        PHYSICAL EXAMINATION: Vital signs: BP (!) 102/50 (BP Location: Left Arm, Patient Position: Sitting, Cuff Size: Normal)   Pulse 64   Ht 5\' 11"  (1.803 m)   Wt 177 lb (80.3 kg)   BMI 24.69 kg/m   Constitutional: Tired and chronically ill-appearing, no acute distress. Sitting in wheelchair Psychiatric: alert and oriented x3, cooperative Eyes: extraocular movements intact, anicteric, conjunctiva pink Mouth: oral pharynx moist, no lesions Neck: supple no lymphadenopathy Cardiovascular: heart regular rate and rhythm, no murmur Lungs: clear to auscultation bilaterally Abdomen: soft, nontender, nondistended, no obvious ascites, no peritoneal signs, normal bowel sounds, no organomegaly. Large ventral hernia Rectal: Omitted Extremities: no clubbing cyanosis or lower extremity edema bilaterally Skin: no lesions on visible extremities Neuro: No focal deficits. No asterixis.  ASSESSMENT:  #1. [place] cirrhosis complicated by portal hypertension and hepatic encephalopathy. Progressive disease but stable since his last visit. Currently with excellent fluid control and no evidence for encephalopathy.   PLAN:  #1. Continue Aldactone  and Lasix without change #2. Continue lactulose and Xifaxan #3. Basic metabolic panel today to evaluate electrolytes and renal function #4. GI follow-up 3 months #5. This information shared with skilled nursing facility #6. Ongoing general medical care with PCP

## 2017-03-19 NOTE — Patient Instructions (Signed)
Your physician has requested that you go to the basement for lab work before leaving today.  Please follow up with Dr. Henrene Pastor in 3 months.

## 2017-03-24 ENCOUNTER — Other Ambulatory Visit: Payer: Self-pay

## 2017-03-24 ENCOUNTER — Encounter: Payer: Self-pay | Admitting: Internal Medicine

## 2017-03-24 ENCOUNTER — Non-Acute Institutional Stay (SKILLED_NURSING_FACILITY): Payer: PPO | Admitting: Internal Medicine

## 2017-03-24 DIAGNOSIS — F329 Major depressive disorder, single episode, unspecified: Secondary | ICD-10-CM | POA: Diagnosis not present

## 2017-03-24 DIAGNOSIS — N183 Chronic kidney disease, stage 3 unspecified: Secondary | ICD-10-CM

## 2017-03-24 DIAGNOSIS — D638 Anemia in other chronic diseases classified elsewhere: Secondary | ICD-10-CM | POA: Diagnosis not present

## 2017-03-24 DIAGNOSIS — K7581 Nonalcoholic steatohepatitis (NASH): Secondary | ICD-10-CM | POA: Diagnosis not present

## 2017-03-24 DIAGNOSIS — F32A Depression, unspecified: Secondary | ICD-10-CM

## 2017-03-24 DIAGNOSIS — E875 Hyperkalemia: Secondary | ICD-10-CM

## 2017-03-24 NOTE — Progress Notes (Signed)
LOCATION: [facility]  PCP: Blanchie Serve, MD   Code Status: Full Code  Goals of care: Advanced Directive information Advanced Directives 03/24/2017  Does Patient Have a Medical Advance Directive? No  Type of Advance Directive -  Does patient want to make changes to medical advance directive? -  Copy of [name] in Chart? -  Would patient like information on creating a medical advance directive? -       Extended Emergency Contact Information Primary Emergency Contact: Suszanne Conners Address: [place], [place] [address] [place] Phone: [phone] Relation: Spouse   Allergies  Allergen Reactions  . Oxycodone Other (See Comments)    Reaction:  Caused pt to fall     Chief Complaint  Patient presents with  . Acute Visit    follow up depression     HPI:  Patient is a 71 y.o. male seen today for acute visit for follow up for his depression. He was recently started on sertraline 25 mg daily almost 2 weeks back. He has history of NASH with liver cirrhosis and portal hypertension. He is seen in his room today. He has been somewhat interactive with staff but only answers questions asked. He has been participating some in therapy as well. He has been seen by GI, note reviewed. He had a bowel movement this am.   Review of Systems:  Constitutional: Negative for fever, chills. HENT: Negative for headache, congestion.   Eyes: Negative for double vision and discharge.  Respiratory: Negative for cough, shortness of breath. Cardiovascular: Negative for chest pain.  Gastrointestinal: Negative for heartburn, nausea, vomiting, abdominal pain. Had a bowel movement today. Genitourinary: Negative for dysuria.  Musculoskeletal: Negative for pain, fall in the facility.  Skin: Negative for itching, rash.  Neurological: Negative for dizziness. Psychiatric/Behavioral: Positive for depression.   Past Medical  History:  Diagnosis Date  . Abdominal hernia   . Ascites   . Benign neoplasm of colon   . Cirrhosis ([place])   . Diabetes mellitus, type 2 ([place])    resolved with gastric bypass  . Diverticulosis of colon (without mention of hemorrhage)   . Esophageal varices ([place])   . Hyperlipemia   . Hypertension   . Iron deficiency anemia secondary to blood loss (chronic)   . Kidney stones   . Liver cyst   . Lymphopenia   . NASH (nonalcoholic steatohepatitis)   . Osteoarthritis of hip   . Renal cyst   . Splenomegaly   . Unspecified sleep apnea    Resolved   Past Surgical History:  Procedure Laterality Date  . ANKLE SURGERY     Bil  . COLONOSCOPY  2012  . GASTRIC BYPASS  2005  . KNEE SURGERY     Bil  . POLYPECTOMY    . TOTAL HIP ARTHROPLASTY  2008   right   Social History:   reports that he has never smoked. He has never used smokeless tobacco. He reports that he does not drink alcohol or use drugs.  Family History  Problem Relation Age of Onset  . Heart attack Father        deceased due to MI  . Heart disease Father   . Diabetes Mother        deceased due to complications of DM  . Coronary artery disease Brother   . Colon cancer Neg Hx   . Stomach cancer Neg  Hx   . Liver disease Neg Hx     Medications: Allergies as of 03/24/2017      Reactions   Oxycodone Other (See Comments)   Reaction:  Caused pt to fall       Medication List       Accurate as of 03/24/17  1:20 PM. Always use your most recent med list.          acetaminophen 325 MG tablet Commonly known as:  TYLENOL Take 650 mg by mouth every 4 (four) hours as needed.   diphenoxylate-atropine 2.5-0.025 MG tablet Commonly known as:  LOMOTIL Take 1 tablet by mouth 4 (four) times daily as needed for diarrhea or loose stools.   furosemide 20 MG tablet Commonly known as:  LASIX Take 1 tablet (20 mg total) by mouth 2 (two) times daily.   lactose free nutrition Liqd Take 237 mLs by mouth 2 (two) times daily.     LACTULOSE PO Take 45 mLs by mouth 3 (three) times daily. Hold if >5 BM/day   multivitamin with minerals tablet Take 1 tablet by mouth daily.   rifaximin 550 MG Tabs tablet Commonly known as:  XIFAXAN Take 550 mg by mouth 2 (two) times daily.   sertraline 25 MG tablet Commonly known as:  ZOLOFT Take 1 tablet (25 mg total) by mouth at bedtime.   sodium bicarbonate 650 MG tablet Take 650 mg by mouth 2 (two) times daily.   spironolactone 100 MG tablet Commonly known as:  ALDACTONE Take 1 tablet (100 mg total) by mouth daily.   ZINC OXIDE EX Apply 1 application topically as needed.       Immunizations:  There is no immunization history on file for this patient.   Physical Exam: Vitals:   03/24/17 1230  BP: 130/75  Pulse: 71  Resp: 14  Temp: 98.5 F (36.9 C)  SpO2: 98%  Weight: 176 lb (79.8 kg)  Height: 5\' 11"  (1.803 m)   Body mass index is 24.55 kg/m.  General- elderly male, well built, in no acute distress Head- normocephalic, atraumatic Nose- no nasal discharge Throat- moist mucus membrane, normal oropharynx  Eyes- PERRLA, EOMI, no pallor, no icterus, no discharge Neck- no cervical lymphadenopathy Cardiovascular- normal s1,s2, no murmur Respiratory- bilateral clear to auscultation Abdomen- bowel sounds present, soft, distended, non tender, mild ascites present Musculoskeletal- able to move all 4 extremities, trace leg edema, able to move all 4 extremities Neurological- alert and oriented to person and place Skin- warm and dry Psychiatry- makes eye contact, somewhat flat affect present    Labs reviewed: Basic Metabolic Panel:  Recent Labs  02/13/17 0507 02/14/17 0519 02/21/17 02/24/17 03/19/17 1556  NA 139 136 138 139 136  K 4.0 4.1 4.4 4.7 5.2*  CL 116* 111  --   --  106  CO2 20* 18*  --   --  24  GLUCOSE 123* 137*  --   --  232*  BUN 23* 24* 25* 28* 37*  CREATININE 1.31* 1.33* 1.6* 1.8* 1.85*  CALCIUM 8.1* 8.2*  --   --  8.8   Liver  Function Tests:  Recent Labs  02/12/17 0521 02/13/17 0507 02/14/17 0519 02/21/17  AST 32 35 38 21  ALT 27 30 33 22  ALKPHOS 112 111 111 100  BILITOT 1.4* 1.6* 1.5*  --   PROT 5.2* 5.0* 4.9*  --   ALBUMIN 3.0* 2.9* 2.8*  --     Recent Labs  12/20/16 0137  LIPASE 56*  Recent Labs  02/12/17 0521 02/13/17 0507 02/14/17 0519  AMMONIA 46* 64* 83*   CBC:  Recent Labs  11/13/16 1704  12/20/16 0137  12/23/16 0539  02/11/17 2005 02/12/17 0521 02/13/17 0507 02/21/17 02/24/17  WBC 8.2  < > 3.8*  < > 2.0*  < > 2.8* 2.2* 2.2* 1.9 2.0  NEUTROABS 8.0*  --  3.4  --  1.4*  --   --   --   --   --   --   HGB 10.8*  < > 10.7*  < > 8.8*  < > 11.2* 9.5* 9.9* 9.2* 9.4*  HCT 30.7*  < > 30.3*  < > 24.9*  < > 32.7* 27.7* 27.9* 27* 28*  MCV 88.7  < > 88.3  < > 85.0  < > 87.4 86.8 86.9  --   --   PLT 36*  < > 38*  < > 31*  < > 44* 39* 41* 42* 37*  < > = values in this interval not displayed. Cardiac Enzymes:  Recent Labs  12/20/16 0541  TROPONINI 0.04*   BNP: Invalid input(s): POCBNP CBG:  Recent Labs  11/16/16 1342 12/23/16 0432 02/11/17 1838  GLUCAP 119* 96 205*    Radiological Exams: No results found.  Assessment/Plan  NASH With liver cirrhosis. Improved mental state, still has depression. Continue rifaxamin and lactulose, check bmp  Depression Persists, has history of depression in past. Currently likely multifactorial with his liver disease and change in surroundings. Change his sertraline to 37.5 mg daily for now and monitor his mood. With his history of NASH, check LFT.   ckd stage 3 Reviewed labs, avoid nephrotoxic agent, monitor bmp periodically  Anemia of chronic disease No active bleed reported, check cbc   Labs/tests ordered: cbc, cmp next lab  Family/ staff Communication: reviewed care plan with patient and nursing supervisor    Blanchie Serve, MD Internal Medicine [facility] [address] [place], [place] [address] Cell Phone (Monday-Friday 8 am - 5 pm): [phone] On Call: [phone] and follow prompts after 5 pm and on weekends Office Phone: [phone] Office Fax: [phone]

## 2017-03-27 DIAGNOSIS — N184 Chronic kidney disease, stage 4 (severe): Secondary | ICD-10-CM | POA: Diagnosis not present

## 2017-03-27 DIAGNOSIS — D509 Iron deficiency anemia, unspecified: Secondary | ICD-10-CM | POA: Diagnosis not present

## 2017-03-27 LAB — BASIC METABOLIC PANEL
BUN: 40 — AB (ref 4–21)
CREATININE: 1.9 — AB (ref 0.6–1.3)
Glucose: 123
Potassium: 4.7 (ref 3.4–5.3)
Sodium: 135 — AB (ref 137–147)

## 2017-03-27 LAB — CBC AND DIFFERENTIAL
HCT: 31 — AB (ref 41–53)
HEMOGLOBIN: 10.8 — AB (ref 13.5–17.5)
PLATELETS: 36 — AB (ref 150–399)
WBC: 2.7

## 2017-03-27 LAB — HEPATIC FUNCTION PANEL
ALK PHOS: 95 (ref 25–125)
ALT: 26 (ref 10–40)
AST: 25 (ref 14–40)
Bilirubin, Total: 1.2

## 2017-03-28 ENCOUNTER — Non-Acute Institutional Stay (SKILLED_NURSING_FACILITY): Payer: PPO | Admitting: Internal Medicine

## 2017-03-28 ENCOUNTER — Encounter: Payer: Self-pay | Admitting: Internal Medicine

## 2017-03-28 DIAGNOSIS — K7581 Nonalcoholic steatohepatitis (NASH): Secondary | ICD-10-CM

## 2017-03-28 DIAGNOSIS — D696 Thrombocytopenia, unspecified: Secondary | ICD-10-CM | POA: Diagnosis not present

## 2017-03-28 DIAGNOSIS — E860 Dehydration: Secondary | ICD-10-CM

## 2017-03-28 DIAGNOSIS — N183 Chronic kidney disease, stage 3 (moderate): Secondary | ICD-10-CM | POA: Diagnosis not present

## 2017-03-28 DIAGNOSIS — N179 Acute kidney failure, unspecified: Secondary | ICD-10-CM

## 2017-03-28 DIAGNOSIS — K7460 Unspecified cirrhosis of liver: Secondary | ICD-10-CM

## 2017-03-28 DIAGNOSIS — R5381 Other malaise: Secondary | ICD-10-CM

## 2017-03-28 NOTE — Progress Notes (Signed)
LOCATION: [facility]  PCP: Blanchie Serve, MD   Code Status: Full Code  Goals of care: Advanced Directive information Advanced Directives 03/24/2017  Does Patient Have a Medical Advance Directive? No  Type of Advance Directive -  Does patient want to make changes to medical advance directive? -  Copy of [place] in Chart? -  Would patient like information on creating a medical advance directive? -       Extended Emergency Contact Information Primary Emergency Contact: Suszanne Conners Address: [place], [place] [address] [place] Phone: [phone] Relation: Spouse   Allergies  Allergen Reactions  . Oxycodone Other (See Comments)    Reaction:  Caused pt to fall     Chief Complaint  Patient presents with  . Acute Visit    ongoing weakness and decreased participation in ADLs     HPI:  Patient is a 71 y.o. male seen today for acute visit for decreased participation in activities like feeding him self. He prefers to be in bed. He is participating minimally with therapy. He is eating his food when fed. He had labs drawn showing worsening renal function. He had 3 bowel movement yesterday per nursing. No vomiting reported by nursing.   Review of Systems: from patient Constitutional: Negative for fever, chills. HENT: Negative for headache  Respiratory: Negative for cough, shortness of breath. Cardiovascular: Negative for chest pain.  Gastrointestinal: Negative for heartburn, nausea, vomiting, abdominal pain. Had a bowel movement today. Genitourinary: Negative for dysuria.  Musculoskeletal: Negative for pain, fall in the facility.  Skin: Negative for itching, rash.  Neurological: Negative for dizziness. Psychiatric/Behavioral: Positive for depression.   Past Medical History:  Diagnosis Date  . Abdominal hernia   . Ascites   . Benign neoplasm of colon   . Cirrhosis ([place])   . Diabetes  mellitus, type 2 ([place])    resolved with gastric bypass  . Diverticulosis of colon (without mention of hemorrhage)   . Esophageal varices ([place])   . Hyperlipemia   . Hypertension   . Iron deficiency anemia secondary to blood loss (chronic)   . Kidney stones   . Liver cyst   . Lymphopenia   . NASH (nonalcoholic steatohepatitis)   . Osteoarthritis of hip   . Renal cyst   . Splenomegaly   . Unspecified sleep apnea    Resolved   Past Surgical History:  Procedure Laterality Date  . ANKLE SURGERY     Bil  . COLONOSCOPY  2012  . GASTRIC BYPASS  2005  . KNEE SURGERY     Bil  . POLYPECTOMY    . TOTAL HIP ARTHROPLASTY  2008   right   Social History:   reports that he has never smoked. He has never used smokeless tobacco. He reports that he does not drink alcohol or use drugs.  Family History  Problem Relation Age of Onset  . Heart attack Father        deceased due to MI  . Heart disease Father   . Diabetes Mother        deceased due to complications of DM  . Coronary artery disease Brother   . Colon cancer Neg Hx   . Stomach cancer Neg Hx   . Liver disease Neg Hx     Medications: Allergies as of 03/28/2017      Reactions   Oxycodone Other (See Comments)  Reaction:  Caused pt to fall       Medication List       Accurate as of 03/28/17 11:50 AM. Always use your most recent med list.          acetaminophen 325 MG tablet Commonly known as:  TYLENOL Take 650 mg by mouth every 4 (four) hours as needed.   diphenoxylate-atropine 2.5-0.025 MG tablet Commonly known as:  LOMOTIL Take 1 tablet by mouth 4 (four) times daily as needed for diarrhea or loose stools.   furosemide 20 MG tablet Commonly known as:  LASIX Take 1 tablet (20 mg total) by mouth 2 (two) times daily.   lactose free nutrition Liqd Take 237 mLs by mouth 2 (two) times daily.   LACTULOSE PO Take 45 mLs by mouth 3 (three) times daily. Hold if >5 BM/day   multivitamin with minerals tablet Take 1  tablet by mouth daily.   rifaximin 550 MG Tabs tablet Commonly known as:  XIFAXAN Take 550 mg by mouth 2 (two) times daily.   sodium bicarbonate 650 MG tablet Take 650 mg by mouth 2 (two) times daily.   spironolactone 100 MG tablet Commonly known as:  ALDACTONE Take 1 tablet (100 mg total) by mouth daily.   ZINC OXIDE EX Apply 1 application topically as needed.   ZOLOFT PO Take 37.5 mg by mouth at bedtime.       Immunizations:  There is no immunization history on file for this patient.   Physical Exam: Vitals:   03/28/17 1030  BP: 122/68  Pulse: 66  Resp: 16  Temp: 98 F (36.7 C)  TempSrc: Oral  SpO2: 95%  Weight: 169 lb (76.7 kg)  Height: 5\' 11"  (1.803 m)   Body mass index is 23.57 kg/m.  General- elderly male, frail, in no acute distress Head- normocephalic, atraumatic Nose- no nasal discharge Throat- dry mucus membrane, normal oropharynx  Eyes- PERRLA, EOMI, no pallor, no icterus, no discharge Neck- no cervical lymphadenopathy Cardiovascular- normal s1,s2, no murmur Respiratory- bilateral clear to auscultation, no wheeze or rales Abdomen- bowel sounds present, soft, distended, non tender, mild ascites present Musculoskeletal- able to move all 4 extremities, trace leg edema Neurological- alert and oriented to person and place Skin- warm and dry Psychiatry- flat affect present    Labs reviewed: Basic Metabolic Panel:  Recent Labs  02/13/17 0507 02/14/17 0519  02/24/17 03/19/17 1556 03/27/17  NA 139 136  < > 139 136 135*  K 4.0 4.1  < > 4.7 5.2* 4.7  CL 116* 111  --   --  106  --   CO2 20* 18*  --   --  24  --   GLUCOSE 123* 137*  --   --  232*  --   BUN 23* 24*  < > 28* 37* 40*  CREATININE 1.31* 1.33*  < > 1.8* 1.85* 1.9*  CALCIUM 8.1* 8.2*  --   --  8.8  --   < > = values in this interval not displayed. Liver Function Tests:  Recent Labs  02/12/17 0521 02/13/17 0507 02/14/17 0519 02/21/17 03/27/17  AST 32 35 38 21 25  ALT 27 30 33  22 26  ALKPHOS 112 111 111 100 95  BILITOT 1.4* 1.6* 1.5*  --   --   PROT 5.2* 5.0* 4.9*  --   --   ALBUMIN 3.0* 2.9* 2.8*  --   --     Recent Labs  12/20/16 0137  LIPASE 56*  Recent Labs  02/12/17 0521 02/13/17 0507 02/14/17 0519  AMMONIA 46* 64* 83*   CBC:  Recent Labs  11/13/16 1704  12/20/16 0137  12/23/16 0539  02/11/17 2005 02/12/17 0521 02/13/17 0507 02/21/17 02/24/17 03/27/17  WBC 8.2  < > 3.8*  < > 2.0*  < > 2.8* 2.2* 2.2* 1.9 2.0 2.7  NEUTROABS 8.0*  --  3.4  --  1.4*  --   --   --   --   --   --   --   HGB 10.8*  < > 10.7*  < > 8.8*  < > 11.2* 9.5* 9.9* 9.2* 9.4* 10.8*  HCT 30.7*  < > 30.3*  < > 24.9*  < > 32.7* 27.7* 27.9* 27* 28* 31*  MCV 88.7  < > 88.3  < > 85.0  < > 87.4 86.8 86.9  --   --   --   PLT 36*  < > 38*  < > 31*  < > 44* 39* 41* 42* 37* 36*  < > = values in this interval not displayed. Cardiac Enzymes:  Recent Labs  12/20/16 0541  TROPONINI 0.04*   BNP: Invalid input(s): POCBNP CBG:  Recent Labs  11/16/16 1342 12/23/16 0432 02/11/17 1838  GLUCAP 119* 96 205*    Radiological Exams: No results found.  Assessment/Plan  Physical deconditioning Ongoing with decline anticipated given his minimal improvement with therapy and now decline. Concern for dehydration on lab review. See below. Will need a care plan meeting with family to review goals of care.   Dehydration With increase in Hb/Hct and bun/cr. No diarrhea has been reported. Currently on lactulose, lasix and spironolactone that can make him dehydrated along with poor fluid intake. With minimal edema on exam, decrease lasix to 20 mg daily.   NASH With liver cirrhosis. Has confusion. Continue rifaxamin and lactulose, check bmp and monitor bowel movement, if has > 3 bowel movement or loose stool, decrease lactulose dosing. Decrease lasix dosing. Continue spironolactone. Check ammonia level to evaluate for hepatic encephalopathy.   Acute on ckd stage 3 Reviewed labs, avoid  nephrotoxic agent, monitor bmp periodically, consider iv fluids if renal function worsens. Encourage fluid by mouth for now. Check bmp and cbc next lab  Thrombocytopenia No bleed reported on noted on exam. Has NASH with liver cirrhosis likely contributing, monitor    Labs/tests ordered: bmp, cbc, ammonia  Family/ staff Communication: reviewed care plan with patient and nursing supervisor    Blanchie Serve, MD Internal Medicine [place], [place] [address] Cell Phone (Monday-Friday 8 am - 5 pm): [phone] On Call: [phone] and follow prompts after 5 pm and on weekends Office Phone: [phone] Office Fax: [phone]

## 2017-03-31 DIAGNOSIS — I10 Essential (primary) hypertension: Secondary | ICD-10-CM | POA: Diagnosis not present

## 2017-03-31 DIAGNOSIS — K7290 Hepatic failure, unspecified without coma: Secondary | ICD-10-CM | POA: Diagnosis not present

## 2017-03-31 DIAGNOSIS — N184 Chronic kidney disease, stage 4 (severe): Secondary | ICD-10-CM | POA: Diagnosis not present

## 2017-03-31 DIAGNOSIS — D509 Iron deficiency anemia, unspecified: Secondary | ICD-10-CM | POA: Diagnosis not present

## 2017-03-31 LAB — CBC AND DIFFERENTIAL
HEMATOCRIT: 30 — AB (ref 41–53)
Hemoglobin: 10.4 — AB (ref 13.5–17.5)
PLATELETS: 37 — AB (ref 150–399)
WBC: 2

## 2017-03-31 LAB — HEPATIC FUNCTION PANEL
ALK PHOS: 89 (ref 25–125)
ALT: 21 (ref 10–40)
AST: 21 (ref 14–40)
Bilirubin, Total: 1.1

## 2017-03-31 LAB — BASIC METABOLIC PANEL
BUN: 43 — AB (ref 4–21)
Creatinine: 1.9 — AB (ref 0.6–1.3)
GLUCOSE: 125
POTASSIUM: 4.6 (ref 3.4–5.3)
SODIUM: 136 — AB (ref 137–147)

## 2017-03-31 LAB — ALBUMIN: Albumin: 2.9

## 2017-03-31 LAB — AMMONIA: Ammonia: 118

## 2017-03-31 LAB — PROTEIN, TOTAL: Total Protein: 5.3 g/dL

## 2017-03-31 LAB — CALCIUM: CALCIUM: 8.2

## 2017-04-01 ENCOUNTER — Non-Acute Institutional Stay (SKILLED_NURSING_FACILITY): Payer: PPO | Admitting: Family

## 2017-04-01 ENCOUNTER — Encounter: Payer: Self-pay | Admitting: Family

## 2017-04-01 DIAGNOSIS — E440 Moderate protein-calorie malnutrition: Secondary | ICD-10-CM | POA: Diagnosis not present

## 2017-04-01 DIAGNOSIS — K7581 Nonalcoholic steatohepatitis (NASH): Secondary | ICD-10-CM

## 2017-04-01 DIAGNOSIS — K7460 Unspecified cirrhosis of liver: Secondary | ICD-10-CM

## 2017-04-01 MED ORDER — LACTULOSE 10 GM/15ML PO SOLN: 30.0000 g | Freq: Four times a day (QID) | ORAL | 0 refills | Status: AC

## 2017-04-01 NOTE — Progress Notes (Signed)
Location:  [facility] Room Number: 35 Place of Service:  SNF (31) Provider: Dinah Ngetich FNP-C  Blanchie Serve, MD  Patient Care Team: Blanchie Serve, MD as PCP - General (Internal Medicine) Ngetich, Nelda Bucks, NP as Nurse Practitioner (Family Medicine)  Extended Emergency Contact Information Primary Emergency Contact: Suszanne Conners Address: [address]          [place], [place] [address] [place] Phone: [phone] Relation: Spouse  Code Status:  Full code  Goals of care: Advanced Directive information Advanced Directives 04/01/2017  Does Patient Have a Medical Advance Directive? No  Type of Advance Directive -  Does patient want to make changes to medical advance directive? -  Copy of [place] in Chart? -  Would patient like information on creating a medical advance directive? -     Chief Complaint  Patient presents with  . Acute Visit    abnormal labs; discuss possible DNR    HPI:  Pt is a 71 y.o. male seen today at [facility] for an acute visit for evaluation of abnormal lab results. He is seen in his room today. He denies any new acute issues this visit. Facility Nurse reports patient has required more assistance with ADL's including feeding. His recent lab results BUN 43,CR 1.85, TP 5.3, ALB 2.9, ammonia 118 ( 03/31/2017).    Past Medical History:  Diagnosis Date  . Abdominal hernia   . Ascites   . Benign neoplasm of colon   . Cirrhosis ([place])   . Diabetes mellitus, type 2 ([place])    resolved with gastric bypass  . Diverticulosis of colon (without mention of hemorrhage)   . Esophageal varices ([place])   . Hyperlipemia   . Hypertension   . Iron deficiency anemia secondary to blood loss (chronic)   . Kidney stones   . Liver cyst   . Lymphopenia   . NASH (nonalcoholic steatohepatitis)   . Osteoarthritis of hip   . Renal cyst   . Splenomegaly   . Unspecified sleep apnea    Resolved   Past  Surgical History:  Procedure Laterality Date  . ANKLE SURGERY     Bil  . COLONOSCOPY  2012  . GASTRIC BYPASS  2005  . KNEE SURGERY     Bil  . POLYPECTOMY    . TOTAL HIP ARTHROPLASTY  2008   right    Allergies  Allergen Reactions  . Oxycodone Other (See Comments)    Reaction:  Caused pt to fall     Allergies as of 04/01/2017      Reactions   Oxycodone Other (See Comments)   Reaction:  Caused pt to fall       Medication List       Accurate as of 04/01/17  3:06 PM. Always use your most recent med list.          acetaminophen 325 MG tablet Commonly known as:  TYLENOL Take 650 mg by mouth every 4 (four) hours as needed.   diphenoxylate-atropine 2.5-0.025 MG tablet Commonly known as:  LOMOTIL Take 1 tablet by mouth 4 (four) times daily as needed for diarrhea or loose stools.   furosemide 20 MG tablet Commonly known as:  LASIX Take 20 mg by mouth daily.   lactose free nutrition Liqd Take 237 mLs by mouth 2 (two) times daily.   lactulose 10 GM/15ML solution Commonly known as:  CHRONULAC Take 45 mLs (30 g total) by mouth  4 (four) times daily. Then resume 45 mls by mouth three times daily.Hold if >5 BM/day   multivitamin with minerals tablet Take 1 tablet by mouth daily.   rifaximin 550 MG Tabs tablet Commonly known as:  XIFAXAN Take 550 mg by mouth 2 (two) times daily.   sodium bicarbonate 650 MG tablet Take 650 mg by mouth 2 (two) times daily.   spironolactone 100 MG tablet Commonly known as:  ALDACTONE Take 1 tablet (100 mg total) by mouth daily.   ZINC OXIDE EX Apply 1 application topically as needed.   ZOLOFT PO Take 37.5 mg by mouth at bedtime.       Review of Systems  Constitutional: Positive for activity change and fatigue. Negative for appetite change, chills and fever.  HENT: Negative for congestion, rhinorrhea, sinus pain, sinus pressure, sneezing and sore throat.   Eyes: Negative.   Respiratory: Negative for cough, chest tightness,  shortness of breath and wheezing.   Cardiovascular: Negative for chest pain, palpitations and leg swelling.  Gastrointestinal: Positive for abdominal distention. Negative for abdominal pain, constipation, diarrhea, nausea and vomiting.       Reports 2-3 bowel movements per day.  Genitourinary: Negative for dysuria, flank pain, frequency and urgency.  Musculoskeletal: Positive for gait problem.  Skin: Negative for color change, pallor and rash.  Neurological: Negative for dizziness, seizures, light-headedness and headaches.  Psychiatric/Behavioral: Negative for agitation, confusion, hallucinations, sleep disturbance and suicidal ideas. The patient is not nervous/anxious.      There is no immunization history on file for this patient. Pertinent  Health Maintenance Due  Topic Date Due  . FOOT EXAM  12/29/1955  . OPHTHALMOLOGY EXAM  12/29/1955  . URINE MICROALBUMIN  12/29/1955  . PNA vac Low Risk Adult (1 of 2 - PCV13) 12/30/2011  . INFLUENZA VACCINE  05/07/2017  . HEMOGLOBIN A1C  06/22/2017  . COLONOSCOPY  08/03/2019    Vitals:   04/01/17 1123  BP: 133/70  Pulse: 93  Resp: 20  Temp: 98.7 F (37.1 C)  SpO2: 96%  Weight: 167 lb (75.8 kg)  Height: 5\' 11"  (1.803 m)   Body mass index is 23.29 kg/m. Physical Exam  Constitutional: He is oriented to person, place, and time.  Elderly in no acute ditress  HENT:  Head: Normocephalic.  Mouth/Throat: Oropharynx is clear and moist. No oropharyngeal exudate.  Eyes: Conjunctivae and EOM are normal. Pupils are equal, round, and reactive to light. Right eye exhibits no discharge. Left eye exhibits no discharge. No scleral icterus.  Neck: Normal range of motion. No JVD present. No thyromegaly present.  Cardiovascular: Normal rate, regular rhythm, normal heart sounds and intact distal pulses.  Exam reveals no gallop and no friction rub.   No murmur heard. Pulmonary/Chest: Effort normal and breath sounds normal. No respiratory distress. He has  no wheezes. He has no rales.  Abdominal: Soft. Bowel sounds are normal. He exhibits distension. There is no tenderness. There is no rebound and no guarding.  Musculoskeletal: He exhibits no edema, tenderness or deformity.  Moves x 4 extremities.Generalized weakness. Ambulates using a FWW  Lymphadenopathy:    He has no cervical adenopathy.  Neurological: He is oriented to person, place, and time.  Skin: Skin is warm and dry. No rash noted. No erythema. No pallor.  Psychiatric: He has a normal mood and affect.    Labs reviewed:  Recent Labs  02/13/17 0507 02/14/17 0519  03/19/17 1556 03/27/17 03/31/17  NA 139 136  < > 136 135* 136*  K 4.0 4.1  < > 5.2* 4.7 4.6  CL 116* 111  --  106  --   --   CO2 20* 18*  --  24  --   --   GLUCOSE 123* 137*  --  232*  --   --   BUN 23* 24*  < > 37* 40* 43*  CREATININE 1.31* 1.33*  < > 1.85* 1.9* 1.9*  CALCIUM 8.1* 8.2*  --  8.8  --  8.2  < > = values in this interval not displayed.  Recent Labs  02/12/17 0521 02/13/17 0507 02/14/17 0519 02/21/17 03/27/17 03/31/17  AST 32 35 38 21 25 21   ALT 27 30 33 22 26 21   ALKPHOS 112 111 111 100 95 89  BILITOT 1.4* 1.6* 1.5*  --   --   --   PROT 5.2* 5.0* 4.9*  --   --  5.3  ALBUMIN 3.0* 2.9* 2.8*  --   --  2.9    Recent Labs  11/13/16 1704  12/20/16 0137  12/23/16 0539  02/11/17 2005 02/12/17 0521 02/13/17 0507  02/24/17 03/27/17 03/31/17  WBC 8.2  < > 3.8*  < > 2.0*  < > 2.8* 2.2* 2.2*  < > 2.0 2.7 2.0  NEUTROABS 8.0*  --  3.4  --  1.4*  --   --   --   --   --   --   --   --   HGB 10.8*  < > 10.7*  < > 8.8*  < > 11.2* 9.5* 9.9*  < > 9.4* 10.8* 10.4*  HCT 30.7*  < > 30.3*  < > 24.9*  < > 32.7* 27.7* 27.9*  < > 28* 31* 30*  MCV 88.7  < > 88.3  < > 85.0  < > 87.4 86.8 86.9  --   --   --   --   PLT 36*  < > 38*  < > 31*  < > 44* 39* 41*  < > 37* 36* 37*  < > = values in this interval not displayed. Lab Results  Component Value Date   TSH 1.146 02/14/2015   Lab Results  Component Value Date    HGBA1C 5.2 12/20/2016    Assessment/Plan  Liver cirrhosis secondary to NASH  Recent Ammonia level 118 ( 03/31/2017). Will change Lactulose 10 gm /15 ml sol to 45 mls( 30 Gm) by mouth four times daily x 3 days then resume 45 mls by mouth three times daily. Hold for > 5 Bowel movements per day. Recheck CMP, ammonia level 04/07/2017.   Protein Malnutrition Possible due to poor oral intake.Continue to encourage supplement. Continue to monitor.   Family/ staff Communication: Reviewed plan of care with patient and facility Nurse supervisor  Labs/tests ordered:  CMP and ammonia level 04/07/2017  Sandrea Hughs, NP

## 2017-04-07 ENCOUNTER — Encounter: Payer: Self-pay | Admitting: Internal Medicine

## 2017-04-07 ENCOUNTER — Non-Acute Institutional Stay (SKILLED_NURSING_FACILITY): Payer: PPO | Admitting: Internal Medicine

## 2017-04-07 DIAGNOSIS — E039 Hypothyroidism, unspecified: Secondary | ICD-10-CM | POA: Diagnosis not present

## 2017-04-07 DIAGNOSIS — K7581 Nonalcoholic steatohepatitis (NASH): Secondary | ICD-10-CM | POA: Diagnosis not present

## 2017-04-07 DIAGNOSIS — F329 Major depressive disorder, single episode, unspecified: Secondary | ICD-10-CM

## 2017-04-07 DIAGNOSIS — Z7189 Other specified counseling: Secondary | ICD-10-CM | POA: Diagnosis not present

## 2017-04-07 DIAGNOSIS — E872 Acidosis, unspecified: Secondary | ICD-10-CM

## 2017-04-07 DIAGNOSIS — N184 Chronic kidney disease, stage 4 (severe): Secondary | ICD-10-CM

## 2017-04-07 DIAGNOSIS — R5381 Other malaise: Secondary | ICD-10-CM | POA: Diagnosis not present

## 2017-04-07 DIAGNOSIS — K7460 Unspecified cirrhosis of liver: Secondary | ICD-10-CM

## 2017-04-07 DIAGNOSIS — E440 Moderate protein-calorie malnutrition: Secondary | ICD-10-CM | POA: Diagnosis not present

## 2017-04-07 DIAGNOSIS — F0390 Unspecified dementia without behavioral disturbance: Secondary | ICD-10-CM

## 2017-04-07 DIAGNOSIS — K7290 Hepatic failure, unspecified without coma: Secondary | ICD-10-CM | POA: Diagnosis not present

## 2017-04-07 DIAGNOSIS — F32A Depression, unspecified: Secondary | ICD-10-CM

## 2017-04-07 LAB — BASIC METABOLIC PANEL
BUN: 39 — AB (ref 4–21)
Creatinine: 1.7 — AB (ref 0.6–1.3)
GLUCOSE: 119
POTASSIUM: 4.6 (ref 3.4–5.3)
SODIUM: 135 — AB (ref 137–147)

## 2017-04-07 LAB — HEPATIC FUNCTION PANEL
ALK PHOS: 94 (ref 25–125)
ALT: 27 (ref 10–40)
AST: 26 (ref 14–40)
Bilirubin, Total: 1.2

## 2017-04-07 LAB — TSH: TSH: 10.43 — AB (ref 0.41–5.90)

## 2017-04-07 NOTE — Progress Notes (Signed)
Location:  [place] Room Number: 35 Place of Service:  SNF [phone]) Provider:  Blanchie Serve, MD  Blanchie Serve, MD  Patient Care Team: Blanchie Serve, MD as PCP - General (Internal Medicine) Ngetich, Nelda Bucks, NP as Nurse Practitioner (Family Medicine)  Extended Emergency Contact Information Primary Emergency Contact: Suszanne Conners Address: [address]          [place], [place] [address] [place] Phone: [phone] Relation: Spouse  Code Status:  DNR Goals of care: Advanced Directive information Advanced Directives 04/07/2017  Does Patient Have a Medical Advance Directive? Yes  Type of Advance Directive Out of facility DNR (pink MOST or yellow form)  Does patient want to make changes to medical advance directive? No - Patient declined  Copy of [place] in Chart? -  Would patient like information on creating a medical advance directive? -     Chief Complaint  Patient presents with  . Acute Visit    Ongoing decline in ADLs and weight loss    HPI:  Pt is a 71 y.o. male seen today for an acute visit for ongoing decline. He has poor oral intake, weight loss and is now requiring increased assistance with his ADLs. Per nursing, he is eating about 25% of his meals. He is mostly in bed. He continues to have confusion and memory issue.    Past Medical History:  Diagnosis Date  . Abdominal hernia   . Ascites   . Benign neoplasm of colon   . Cirrhosis ([place])   . Diabetes mellitus, type 2 ([place])    resolved with gastric bypass  . Diverticulosis of colon (without mention of hemorrhage)   . Esophageal varices ([place])   . Hyperlipemia   . Hypertension   . Iron deficiency anemia secondary to blood loss (chronic)   . Kidney stones   . Liver cyst   . Lymphopenia   . NASH (nonalcoholic steatohepatitis)   . Osteoarthritis of hip   . Renal cyst   . Splenomegaly   . Unspecified sleep apnea    Resolved   Past Surgical  History:  Procedure Laterality Date  . ANKLE SURGERY     Bil  . COLONOSCOPY  2012  . GASTRIC BYPASS  2005  . KNEE SURGERY     Bil  . POLYPECTOMY    . TOTAL HIP ARTHROPLASTY  2008   right    Allergies  Allergen Reactions  . Oxycodone Other (See Comments)    Reaction:  Caused pt to fall     Outpatient Encounter Prescriptions as of 04/07/2017  Medication Sig  . acetaminophen (TYLENOL) 325 MG tablet Take 650 mg by mouth every 4 (four) hours as needed.  . diphenoxylate-atropine (LOMOTIL) 2.5-0.025 MG tablet Take 1 tablet by mouth 4 (four) times daily as needed for diarrhea or loose stools.  . furosemide (LASIX) 20 MG tablet Take 20 mg by mouth daily.  Marland Kitchen lactulose (CHRONULAC) 10 GM/15ML solution Take by mouth 2 (two) times daily. Take 45 mL. Hold for >5 bowel movements per day  . Multiple Vitamins-Minerals (MULTIVITAMIN WITH MINERALS) tablet Take 1 tablet by mouth daily.  . Nutritional Supplements (BOOST BREEZE PO) Take 1 Container by mouth 2 (two) times daily between meals.  . rifaximin (XIFAXAN) 550 MG TABS tablet Take 550 mg by mouth 2 (two) times daily.  . Sertraline HCl (ZOLOFT PO) Take 37.5 mg by mouth at bedtime.  . sodium bicarbonate 650 MG tablet Take  650 mg by mouth 2 (two) times daily.  Marland Kitchen spironolactone (ALDACTONE) 100 MG tablet Take 1 tablet (100 mg total) by mouth daily.  Marland Kitchen ZINC OXIDE EX Apply 1 application topically as needed.  . [DISCONTINUED] lactose free nutrition (BOOST) LIQD Take 237 mLs by mouth 2 (two) times daily.   No facility-administered encounter medications on file as of 04/07/2017.     Review of Systems  Constitutional: Positive for appetite change and unexpected weight change. Negative for chills and fever.  HENT: Negative for congestion, mouth sores, sore throat and trouble swallowing.   Respiratory: Negative for cough and shortness of breath.   Cardiovascular: Negative for chest pain and palpitations.  Gastrointestinal: Positive for abdominal distention.  Negative for abdominal pain, nausea and vomiting.       Has been having about 3 bowel movement a day  Genitourinary: Negative for dysuria and flank pain.       Has urinary incontience  Musculoskeletal: Negative for back pain.  Neurological: Positive for weakness. Negative for dizziness, syncope and headaches.  Psychiatric/Behavioral: Positive for confusion.     There is no immunization history on file for this patient. Pertinent  Health Maintenance Due  Topic Date Due  . FOOT EXAM  12/29/1955  . OPHTHALMOLOGY EXAM  12[birth year]29/1955  . URINE MICROALBUMIN  [birth year]29/1956  . PNA vac Low Risk Adult (1 of 2 - PCV13) 12/30/2011  . INFLUENZA VACCINE  05/07/2017  . HEMOGLOBIN A1C  06/22/2017  . COLONOSCOPY  08/03/2019   No flowsheet data found. Functional Status Survey:    Vitals:   04/07/17 1223  BP: (!) 108/58  Pulse: 70  Resp: 16  Temp: 97.8 F (36.6 C)  TempSrc: Oral  SpO2: 96%  Weight: 165 lb (74.8 kg)  Height: 5\' 11"  (1.803 m)   Body mass index is 23.01 kg/m. Physical Exam  Constitutional:  Frail, ill appearing, male patient, in no distress  HENT:  Head: Normocephalic and atraumatic.  Mouth/Throat: Oropharynx is clear and moist.  Eyes: Conjunctivae are normal. Pupils are equal, round, and reactive to light.  Neck: Normal range of motion. Neck supple.  Cardiovascular: Normal rate and regular rhythm.   No murmur heard. Pulmonary/Chest: Effort normal and breath sounds normal. No respiratory distress. He has no wheezes. He has no rales.  Abdominal: Soft. Bowel sounds are normal. He exhibits distension. There is no tenderness. There is no rebound and no guarding.  Musculoskeletal: He exhibits no edema.  Generalized weakness, able to move all 4 extremities, no leg edema  Lymphadenopathy:    He has no cervical adenopathy.  Neurological:  Alert and oriented to person and place  Skin: Skin is warm and dry. He is not diaphoretic.  Psychiatric:  Poor eye contact, flat affect     Labs reviewed:  Recent Labs  02/13/17 0507 02/14/17 0519  03/19/17 1556 03/27/17 03/31/17 04/07/17  NA 139 136  < > 136 135* 136* 135*  K 4.0 4.1  < > 5.2* 4.7 4.6 4.6  CL 116* 111  --  106  --   --   --   CO2 20* 18*  --  24  --   --   --   GLUCOSE 123* 137*  --  232*  --   --   --   BUN 23* 24*  < > 37* 40* 43* 39*  CREATININE 1.31* 1.33*  < > 1.85* 1.9* 1.9* 1.7*  CALCIUM 8.1* 8.2*  --  8.8  --  8.2  --   < > =  values in this interval not displayed.  Recent Labs  02/12/17 0521 02/13/17 0507 02/14/17 0519  03/27/17 03/31/17 04/07/17  AST 32 35 38  < > 25 21 26   ALT 27 30 33  < > 26 21 27   ALKPHOS 112 111 111  < > 95 89 94  BILITOT 1.4* 1.6* 1.5*  --   --   --   --   PROT 5.2* 5.0* 4.9*  --   --  5.3  --   ALBUMIN 3.0* 2.9* 2.8*  --   --  2.9  --   < > = values in this interval not displayed.  Recent Labs  11/13/16 1704  12/20/16 0137  12/23/16 0539  02/11/17 2005 02/12/17 0521 02/13/17 0507  02/24/17 03/27/17 03/31/17  WBC 8.2  < > 3.8*  < > 2.0*  < > 2.8* 2.2* 2.2*  < > 2.0 2.7 2.0  NEUTROABS 8.0*  --  3.4  --  1.4*  --   --   --   --   --   --   --   --   HGB 10.8*  < > 10.7*  < > 8.8*  < > 11.2* 9.5* 9.9*  < > 9.4* 10.8* 10.4*  HCT 30.7*  < > 30.3*  < > 24.9*  < > 32.7* 27.7* 27.9*  < > 28* 31* 30*  MCV 88.7  < > 88.3  < > 85.0  < > 87.4 86.8 86.9  --   --   --   --   PLT 36*  < > 38*  < > 31*  < > 44* 39* 41*  < > 37* 36* 37*  < > = values in this interval not displayed. Lab Results  Component Value Date   TSH 10.43 (A) 04/07/2017   Lab Results  Component Value Date   HGBA1C 5.2 12/20/2016   No results found for: CHOL, HDL, LDLCALC, LDLDIRECT, TRIG, CHOLHDL  Significant Diagnostic Results in last 30 days:  No results found.  Assessment/Plan  Physical deconditioning Ongoing decline noted. Supportive care. Poor prognosis. Palliative care consult.  Advanced care plan Given his ongoing decline in health, contacted his wife/ HCPOA and reviewed  goals of care. He is now DNR and has a MOST form filled out. No further hospitalization, iv fluids or antibiotics, feeding tube for now. Form has been signed and placed in chart. Spent 30 minutes reviewing goals of care with pt's wife and answering her questions. Palliative care consult will be obtained.   Hypothyroidism Reviewed tsh. Start him on levothyroxine 25 mcg daily and check tsh in 8 weeks.   NASH With liver cirrhosis. Elevated ammonia level on lab work but has loose stools and has 3 bowel movement a day.  Continue rifaxamin and lactulose but decrease lactulose to tid for now to avoid dehydration from hypovolemia. Wife does not want iv fluids. Continue spironolactone and lasix but decrease dosing of spironolactone to 75 mg daily given his ongoing weight loss, poor po intake, ckd stage 4.   Protein calorie malnutrition With decline anticipated with his memory loss. Continue nutritional supplement and assist with feeding  Depression Ongoing, increase sertraline to 50 mg daily and monitor.   Dementia With vascular component, supportive care for now, decline anticipated.  ckd stage 4 Monitor clinically.   Metabolic acidosis Normal anion gap with bicarb 22. Currently on sodium bicarbonate 650 mg bid. Change this to daily with his liver cirrhosis. Monitor clinically and consider bmp check  in 1 week.     Family/ staff Communication: reviewed care plan with patient's wife and charge nurse.    Labs/tests ordered:  Bmp in 1 week, tsh in 8 weeks  Blanchie Serve, MD Internal Medicine [facility] [address] [place], [place] [address] Cell Phone (Monday-Friday 8 am - 5 pm): [phone] On Call: [phone] and follow prompts after 5 pm and on weekends Office Phone: [phone] Office Fax: [phone]

## 2017-04-10 DIAGNOSIS — R531 Weakness: Secondary | ICD-10-CM | POA: Diagnosis not present

## 2017-04-13 DIAGNOSIS — K7581 Nonalcoholic steatohepatitis (NASH): Secondary | ICD-10-CM | POA: Diagnosis not present

## 2017-04-13 LAB — BASIC METABOLIC PANEL
BUN: 37 — AB (ref 4–21)
BUN: 37 — AB (ref 4–21)
BUN: 37 — AB (ref 4–21)
BUN: 37 — AB (ref 4–21)
Creatinine: 1.6 — AB (ref 0.6–1.3)
Creatinine: 1.6 — AB (ref 0.6–1.3)
Creatinine: 1.6 — AB (ref 0.6–1.3)
GLUCOSE: 126
GLUCOSE: 126
Glucose: 126
Glucose: 126
POTASSIUM: 4.9 (ref 3.4–5.3)
POTASSIUM: 4.9 (ref 3.4–5.3)
Potassium: 4.9 (ref 3.4–5.3)
SODIUM: 137 (ref 137–147)
SODIUM: 137 (ref 137–147)
Sodium: 137 (ref 137–147)

## 2017-04-14 ENCOUNTER — Non-Acute Institutional Stay (SKILLED_NURSING_FACILITY): Payer: PPO | Admitting: Family

## 2017-04-14 ENCOUNTER — Encounter: Payer: Self-pay | Admitting: Family

## 2017-04-14 DIAGNOSIS — R531 Weakness: Secondary | ICD-10-CM

## 2017-04-14 DIAGNOSIS — E039 Hypothyroidism, unspecified: Secondary | ICD-10-CM | POA: Diagnosis not present

## 2017-04-14 DIAGNOSIS — K7581 Nonalcoholic steatohepatitis (NASH): Secondary | ICD-10-CM | POA: Diagnosis not present

## 2017-04-14 DIAGNOSIS — K7460 Unspecified cirrhosis of liver: Secondary | ICD-10-CM | POA: Diagnosis not present

## 2017-04-14 DIAGNOSIS — F339 Major depressive disorder, recurrent, unspecified: Secondary | ICD-10-CM | POA: Diagnosis not present

## 2017-04-20 NOTE — Progress Notes (Signed)
Location:  [facility] Room Number: 35 Place of Service:  SNF (31) Provider: Briceson Broadwater FNP-C  Blanchie Serve, MD  Patient Care Team: Blanchie Serve, MD as PCP - General (Internal Medicine) Alexzandrea Normington, Nelda Bucks, NP as Nurse Practitioner (Family Medicine)  Extended Emergency Contact Information Primary Emergency Contact: Suszanne Conners Address: [address]          [place], [place] [address] [place] Phone: [phone] Relation: Spouse  Code Status:  DNR/MOST form Goals of care: Advanced Directive information Advanced Directives 04/14/2017  Does Patient Have a Medical Advance Directive? Yes  Type of Advance Directive Out of facility DNR (pink MOST or yellow form)  Does patient want to make changes to medical advance directive? -  Copy of [place] in Chart? -  Would patient like information on creating a medical advance directive? -  Pre-existing out of facility DNR order (yellow form or pink MOST form) Yellow form placed in chart (order not valid for inpatient use)     Chief Complaint  Patient presents with  . Medical Management of Chronic Issues    Routine visit    HPI:  Pt is a 71 y.o. male [facility] at [facility] formedical management of chronic issues.He has a medical history of HTN, CAD,Liver Cirrhosis, type 2 Diabetes, OA, CKD stage 4, Vascular Dementia,thrombocytopenia among other conditions. He is seen in his room today. He denies any acute issues this visit.Facility Nurse reports patient continues to have poor appetite.He is currently on palliative care. POA request referral to hospice service. He continues to require more assistance with ADL's.No recent fall episodes reported.He continues to have progressive weight loss.    Past Medical History:  Diagnosis Date  . Abdominal hernia   . Ascites   . Benign neoplasm of colon   . Cirrhosis ([place])   . Diabetes mellitus, type 2 ([place])    resolved with  gastric bypass  . Diverticulosis of colon (without mention of hemorrhage)   . Esophageal varices ([place])   . Hyperlipemia   . Hypertension   . Iron deficiency anemia secondary to blood loss (chronic)   . Kidney stones   . Liver cyst   . Lymphopenia   . NASH (nonalcoholic steatohepatitis)   . Osteoarthritis of hip   . Renal cyst   . Splenomegaly   . Unspecified sleep apnea    Resolved   Past Surgical History:  Procedure Laterality Date  . ANKLE SURGERY     Bil  . COLONOSCOPY  2012  . GASTRIC BYPASS  2005  . KNEE SURGERY     Bil  . POLYPECTOMY    . TOTAL HIP ARTHROPLASTY  2008   right    Allergies  Allergen Reactions  . Oxycodone Other (See Comments)    Reaction:  Caused pt to fall     Allergies as of 04/14/2017      Reactions   Oxycodone Other (See Comments)   Reaction:  Caused pt to fall       Medication List       Accurate as of 04/14/17 11:59 PM. Always use your most recent med list.          acetaminophen 325 MG tablet Commonly known as:  TYLENOL Take 650 mg by mouth every 4 (four) hours as needed.   BOOST BREEZE PO Take 1 Container by mouth 3 (three) times daily between meals.   diphenoxylate-atropine 2.5-0.025 MG tablet Commonly known  as:  LOMOTIL Take 1 tablet by mouth 4 (four) times daily as needed for diarrhea or loose stools.   furosemide 20 MG tablet Commonly known as:  LASIX Take 20 mg by mouth daily.   lactulose 10 GM/15ML solution Commonly known as:  CHRONULAC Take 10 g by mouth 3 (three) times daily. Take 45 mL. Hold for >5 bowel movements per day   levothyroxine 25 MCG tablet Commonly known as:  SYNTHROID, LEVOTHROID Take 25 mcg by mouth daily before breakfast.   multivitamin with minerals tablet Take 1 tablet by mouth daily.   rifaximin 550 MG Tabs tablet Commonly known as:  XIFAXAN Take 550 mg by mouth 2 (two) times daily.   sodium bicarbonate 650 MG tablet Take 650 mg by mouth daily.   spironolactone 100 MG tablet Commonly  known as:  ALDACTONE Take 1 tablet (100 mg total) by mouth daily.   ZINC OXIDE EX Apply 1 application topically as needed.   ZOLOFT PO Take 50 mg by mouth at bedtime.       Review of Systems  Constitutional: Positive for appetite change and fatigue. Negative for chills and fever.  HENT: Negative for congestion, rhinorrhea, sinus pain, sinus pressure, sneezing, sore throat and trouble swallowing.   Eyes: Negative.   Respiratory: Negative for cough, chest tightness, shortness of breath and wheezing.   Cardiovascular: Negative for chest pain, palpitations and leg swelling.  Gastrointestinal: Positive for abdominal distention. Negative for abdominal pain, constipation, diarrhea, nausea and vomiting.       3 bowel movements per day.  Endocrine: Negative.   Genitourinary: Negative for dysuria, flank pain, frequency and urgency.  Musculoskeletal: Positive for gait problem.  Skin: Negative for color change, pallor and rash.  Neurological: Negative for dizziness, seizures, light-headedness and headaches.       Generalized weakness  Hematological: Does not bruise/bleed easily.  Psychiatric/Behavioral: Negative for agitation, confusion, hallucinations, sleep disturbance and suicidal ideas. The patient is not nervous/anxious.      There is no immunization history on file for this patient. Pertinent  Health Maintenance Due  Topic Date Due  . FOOT EXAM  12/29/1955  . OPHTHALMOLOGY EXAM  [birth year]29/1956  . URINE MICROALBUMIN  [birth year]29/1956  . PNA vac Low Risk Adult (1 of 2 - PCV13) 12/30/2011  . INFLUENZA VACCINE  05/07/2017  . HEMOGLOBIN A1C  06/22/2017  . COLONOSCOPY  08/03/2019    Vitals:   04/14/17 1053  BP: 111/63  Pulse: 60  Resp: 18  Temp: (!) 96.4 F (35.8 C)  SpO2: 97%  Weight: 167 lb (75.8 kg)  Height: 5\' 11"  (1.803 m)   Body mass index is 23.29 kg/m. Physical Exam  Constitutional: He is oriented to person, place, and time.  Frail elderly in no acute distress  HENT:    Head: Normocephalic.  Right Ear: External ear normal.  Left Ear: External ear normal.  Mouth/Throat: Oropharynx is clear and moist. No oropharyngeal exudate.  Eyes: Pupils are equal, round, and reactive to light. Conjunctivae and EOM are normal. Right eye exhibits no discharge. Left eye exhibits no discharge. No scleral icterus.  Neck: Normal range of motion. No JVD present. No thyromegaly present.  Cardiovascular: Normal rate, regular rhythm, normal heart sounds and intact distal pulses.  Exam reveals no gallop and no friction rub.   No murmur heard. Pulmonary/Chest: Effort normal and breath sounds normal. No respiratory distress. He has no wheezes. He has no rales.  Abdominal: Soft. Bowel sounds are normal. He exhibits distension. There is  no tenderness. There is no rebound and no guarding.  Musculoskeletal: He exhibits no edema, tenderness or deformity.  Moves x 4 extremities.unsteady gait   Lymphadenopathy:    He has no cervical adenopathy.  Neurological: He is oriented to person, place, and time.  Generalized weakness   Skin: Skin is warm and dry. No rash noted. No erythema. No pallor.  Psychiatric: He has a normal mood and affect.    Labs reviewed:  Recent Labs  02/13/17 0507 02/14/17 0519  03/19/17 1556  03/31/17 04/07/17 04/13/17  NA 139 136  < > 136  < > 136* 135* 137  K 4.0 4.1  < > 5.2*  < > 4.6 4.6 4.9  CL 116* 111  --  106  --   --   --   --   CO2 20* 18*  --  24  --   --   --   --   GLUCOSE 123* 137*  --  232*  --   --   --   --   BUN 23* 24*  < > 37*  < > 43* 39* 37*  CREATININE 1.31* 1.33*  < > 1.85*  < > 1.9* 1.7* 1.6*  CALCIUM 8.1* 8.2*  --  8.8  --  8.2  --   --   < > = values in this interval not displayed.  Recent Labs  02/12/17 0521 02/13/17 0507 02/14/17 0519  03/27/17 03/31/17 04/07/17  AST 32 35 38  < > 25 21 26   ALT 27 30 33  < > 26 21 27   ALKPHOS 112 111 111  < > 95 89 94  BILITOT 1.4* 1.6* 1.5*  --   --   --   --   PROT 5.2* 5.0* 4.9*  --    --  5.3  --   ALBUMIN 3.0* 2.9* 2.8*  --   --  2.9  --   < > = values in this interval not displayed.  Recent Labs  11/13/16 1704  12/20/16 0137  12/23/16 0539  02/11/17 2005 02/12/17 0521 02/13/17 0507  02/24/17 03/27/17 03/31/17  WBC 8.2  < > 3.8*  < > 2.0*  < > 2.8* 2.2* 2.2*  < > 2.0 2.7 2.0  NEUTROABS 8.0*  --  3.4  --  1.4*  --   --   --   --   --   --   --   --   HGB 10.8*  < > 10.7*  < > 8.8*  < > 11.2* 9.5* 9.9*  < > 9.4* 10.8* 10.4*  HCT 30.7*  < > 30.3*  < > 24.9*  < > 32.7* 27.7* 27.9*  < > 28* 31* 30*  MCV 88.7  < > 88.3  < > 85.0  < > 87.4 86.8 86.9  --   --   --   --   PLT 36*  < > 38*  < > 31*  < > 44* 39* 41*  < > 37* 36* 37*  < > = values in this interval not displayed. Lab Results  Component Value Date   TSH 10.43 (A) 04/07/2017   Lab Results  Component Value Date   HGBA1C 5.2 12/20/2016    Assessment/Plan  Liver cirrhosis secondary to NASH  Progressive decline.currently on palliative care POA request hospice for comfort care due to decline in conditions, poor prognosis and poor oral intake.Has 3 BM per day. Continue  Lactulose 10 gm /15 ml sol  to 45 mls( 30 Gm) by mouth three times daily.   Generalized weakness Poor oral intake contributing. Has had progressive weight loss.Continue to assist with ADL's. Continue current supplements. Hospice consult per POA request.   Depression  Stable.continue on Sertraline 50 mg Tablet daily. Continue to monitor for mood changes.   Hypothyroidsim Lab Results  Component Value Date   TSH 10.43 (A) 04/07/2017  Levothyroxine 25 mcg Tablet recently initiated. Continue to monitor TSH level.   Family/ staff Communication: Reviewed plan of care with patient and facility Nurse   Labs/tests ordered: None   Nelda Bucks Yarielys Beed, NP

## 2017-05-08 DIAGNOSIS — R531 Weakness: Secondary | ICD-10-CM | POA: Diagnosis not present

## 2017-05-08 DIAGNOSIS — K7290 Hepatic failure, unspecified without coma: Secondary | ICD-10-CM | POA: Diagnosis not present

## 2017-05-08 LAB — BASIC METABOLIC PANEL
BUN: 41 — AB (ref 4–21)
CREATININE: 1.5 — AB (ref 0.6–1.3)
Glucose: 132
POTASSIUM: 5 (ref 3.4–5.3)
Sodium: 137 (ref 137–147)

## 2017-05-15 ENCOUNTER — Encounter: Payer: Self-pay | Admitting: Internal Medicine

## 2017-05-19 ENCOUNTER — Non-Acute Institutional Stay (SKILLED_NURSING_FACILITY): Payer: Medicare Other

## 2017-05-19 DIAGNOSIS — Z0000 Encounter for general adult medical examination without abnormal findings: Secondary | ICD-10-CM

## 2017-05-19 NOTE — Progress Notes (Signed)
Subjective:   [name] House is a 71 y.o. male who presents for Medicare Annual/Subsequent preventive examination at [facility]; incapacitated patient unable to answer questions appropriately, hospice patient    Objective:    Vitals: BP 120/64 (BP Location: Left Arm, Patient Position: Supine)   Pulse 73   Temp 97.7 F (36.5 C) (Oral)   Ht 6\' 2"  (1.88 m)   Wt 167 lb (75.8 kg)   SpO2 (!) 62%   BMI 21.44 kg/m   Body mass index is 21.44 kg/m.  Tobacco History  Smoking Status  . Never Smoker  Smokeless Tobacco  . Never Used     Counseling given: Not Answered   Past Medical History:  Diagnosis Date  . Abdominal hernia   . Ascites   . Benign neoplasm of colon   . Cirrhosis ([place])   . Diabetes mellitus, type 2 ([place])    resolved with gastric bypass  . Diverticulosis of colon (without mention of hemorrhage)   . Esophageal varices ([place])   . Hyperlipemia   . Hypertension   . Iron deficiency anemia secondary to blood loss (chronic)   . Kidney stones   . Liver cyst   . Lymphopenia   . NASH (nonalcoholic steatohepatitis)   . Osteoarthritis of hip   . Renal cyst   . Splenomegaly   . Unspecified sleep apnea    Resolved   Past Surgical History:  Procedure Laterality Date  . ANKLE SURGERY     Bil  . COLONOSCOPY  2012  . GASTRIC BYPASS  2005  . KNEE SURGERY     Bil  . POLYPECTOMY    . TOTAL HIP ARTHROPLASTY  2008   right   Family History  Problem Relation Age of Onset  . Heart attack Father        deceased due to MI  . Heart disease Father   . Diabetes Mother        deceased due to complications of DM  . Coronary artery disease Brother   . Colon cancer Neg Hx   . Stomach cancer Neg Hx   . Liver disease Neg Hx    History  Sexual Activity  . Sexual activity: Not on file    Outpatient Encounter Prescriptions as of 05/19/2017  Medication Sig  . acetaminophen (TYLENOL) 325 MG tablet Take 650 mg by mouth every 4 (four) hours as needed.  .  diphenoxylate-atropine (LOMOTIL) 2.5-0.025 MG tablet Take 1 tablet by mouth 4 (four) times daily as needed for diarrhea or loose stools.  . furosemide (LASIX) 20 MG tablet Take 20 mg by mouth daily.  Marland Kitchen lactulose (CHRONULAC) 10 GM/15ML solution Take 10 g by mouth 3 (three) times daily. Take 45 mL. Hold for >5 bowel movements per day   . levothyroxine (SYNTHROID, LEVOTHROID) 25 MCG tablet Take 25 mcg by mouth daily before breakfast.  . Multiple Vitamins-Minerals (MULTIVITAMIN WITH MINERALS) tablet Take 1 tablet by mouth daily.  . Nutritional Supplements (BOOST BREEZE PO) Take 1 Container by mouth 3 (three) times daily between meals.   . rifaximin (XIFAXAN) 550 MG TABS tablet Take 550 mg by mouth 2 (two) times daily.  . Sertraline HCl (ZOLOFT PO) Take 50 mg by mouth at bedtime.   . sodium bicarbonate 650 MG tablet Take 650 mg by mouth daily.   Marland Kitchen spironolactone (ALDACTONE) 100 MG tablet Take 1 tablet (100 mg total) by mouth daily.  Marland Kitchen ZINC OXIDE EX Apply 1 application topically as  needed.   No facility-administered encounter medications on file as of 05/19/2017.     Activities of Daily Living In your present state of health, do you have any difficulty performing the following activities: 05/19/2017 02/12/2017  Hearing? N N  Vision? N N  Difficulty concentrating or making decisions? N Y  Walking or climbing stairs? Y Y  Dressing or bathing? Y N  Doing errands, shopping? Y N  Preparing Food and eating ? Y -  Using the Toilet? Y -  In the past six months, have you accidently leaked urine? Y -  Do you have problems with loss of bowel control? N -  Managing your Medications? Y -  Managing your Finances? Y -  Housekeeping or managing your Housekeeping? Y -  Some recent data might be hidden    Patient Care Team: Blanchie Serve, MD as PCP - General (Internal Medicine) Ngetich, Nelda Bucks, NP as Nurse Practitioner (Family Medicine)   Assessment:     Exercise Activities and Dietary  recommendations Current Exercise Habits: The patient does not participate in regular exercise at present, Exercise limited by: orthopedic condition(s)  Goals    None     Fall Risk Fall Risk  05/19/2017  Falls in the past year? No   Depression Screen PHQ 2/9 Scores 05/19/2017  PHQ - 2 Score 0    Cognitive Function MMSE - Mini Mental State Exam 05/19/2017  Orientation to time 0  Orientation to Place 3  Registration 0  Attention/ Calculation 5  Recall 0  Language- name 2 objects 2  Language- repeat 1  Language- follow 3 step command 2  Language- read & follow direction 1  Write a sentence 0  Copy design 0  Total score 14         There is no immunization history on file for this patient. Screening Tests Health Maintenance  Topic Date Due  . Hepatitis C Screening  November 08, 1945  . FOOT EXAM  12/29/1956  . OPHTHALMOLOGY EXAM  12/29/1956  . URINE MICROALBUMIN  12/29/1956  . TETANUS/TDAP  12/29/1965  . INFLUENZA VACCINE  05/07/2017  . HEMOGLOBIN A1C  06/22/2017  . COLONOSCOPY  08/03/2019  . PNA vac Low Risk Adult  Completed      Plan:    I have personally reviewed and addressed the Medicare Annual Wellness questionnaire and have noted the following in the patient's chart:  A. Medical and social history B. Use of alcohol, tobacco or illicit drugs  C. Current medications and supplements D. Functional ability and status E.  Nutritional status F.  Physical activity G. Advance directives H. List of other physicians I.  Hospitalizations, surgeries, and ER visits in previous 12 months J.  [place] to include hearing, vision, cognitive, depression L. Referrals and appointments - none  In addition, I have reviewed and discussed with patient certain preventive protocols, quality metrics, and best practice recommendations. A written personalized care plan for preventive services as well as general preventive health recommendations were provided to patient.  See  attached scanned questionnaire for additional information.   Signed,   Rich Reining, RN Nurse Health Advisor   Quick Notes   Health Maintenance: Hep C, foot exam, eye exam, microalbumin, tdap due. Not ordered, hospice patient     Abnormal Screen: 14/30 MMSE. Did not pass clock drawing     Patient Concerns: None     Nurse Concerns: None

## 2017-05-19 NOTE — Patient Instructions (Signed)
[name] House , Thank you for taking time to come for your Medicare Wellness Visit. I appreciate your ongoing commitment to your health goals. Please review the following plan we discussed and let me know if I can assist you in the future.   Screening recommendations/referrals: Colonoscopy excluded, hospice patient Recommended yearly ophthalmology/optometry visit for glaucoma screening and checkup Recommended yearly dental visit for hygiene and checkup  Vaccinations: Influenza vaccine  excluded, hospice patient Pneumococcal vaccine  excluded, hospice patient Tdap vaccine  excluded, hospice patient Shingles vaccine excluded, hospice patient  Advanced directives: DNR in chart, copies of health care power of attorney and living will are needed  Conditions/risks identified: Hospice  Next appointment: Dr. Bubba Camp makes rounds  Preventive Care 12 Years and Older, Male Preventive care refers to lifestyle choices and visits with your health care provider that can promote health and wellness. What does preventive care include?  A yearly physical exam. This is also called an annual well check.  Dental exams once or twice a year.  Routine eye exams. Ask your health care provider how often you should have your eyes checked.  Personal lifestyle choices, including:  Daily care of your teeth and gums.  Regular physical activity.  Eating a healthy diet.  Avoiding tobacco and drug use.  Limiting alcohol use.  Practicing safe sex.  Taking low doses of aspirin every day.  Taking vitamin and mineral supplements as recommended by your health care provider. What happens during an annual well check? The services and screenings done by your health care provider during your annual well check will depend on your age, overall health, lifestyle risk factors, and family history of disease. Counseling  Your health care provider may ask you questions about your:  Alcohol use.  Tobacco use.  Drug  use.  Emotional well-being.  Home and relationship well-being.  Sexual activity.  Eating habits.  History of falls.  Memory and ability to understand (cognition).  Work and work Statistician. Screening  You may have the following tests or measurements:  Height, weight, and BMI.  Blood pressure.  Lipid and cholesterol levels. These may be checked every 5 years, or more frequently if you are over 55 years old.  Skin check.  Lung cancer screening. You may have this screening every year starting at age 84 if you have a 30-pack-year history of smoking and currently smoke or have quit within the past 15 years.  Fecal occult blood test (FOBT) of the stool. You may have this test every year starting at age 30.  Flexible sigmoidoscopy or colonoscopy. You may have a sigmoidoscopy every 5 years or a colonoscopy every 10 years starting at age 101.  Prostate cancer screening. Recommendations will vary depending on your family history and other risks.  Hepatitis C blood test.  Hepatitis B blood test.  Sexually transmitted disease (STD) testing.  Diabetes screening. This is done by checking your blood sugar (glucose) after you have not eaten for a while (fasting). You may have this done every 1-3 years.  Abdominal aortic aneurysm (AAA) screening. You may need this if you are a current or former smoker.  Osteoporosis. You may be screened starting at age 80 if you are at high risk. Talk with your health care provider about your test results, treatment options, and if necessary, the need for more tests. Vaccines  Your health care provider may recommend certain vaccines, such as:  Influenza vaccine. This is recommended every year.  Tetanus, diphtheria, and acellular pertussis (Tdap, Td)  vaccine. You may need a Td booster every 10 years.  Zoster vaccine. You may need this after age 15.  Pneumococcal 13-valent conjugate (PCV13) vaccine. One dose is recommended after age  65.  Pneumococcal polysaccharide (PPSV23) vaccine. One dose is recommended after age 48. Talk to your health care provider about which screenings and vaccines you need and how often you need them. This information is not intended to replace advice given to you by your health care provider. Make sure you discuss any questions you have with your health care provider. Document Released: 10/20/2015 Document Revised: 06/12/2016 Document Reviewed: 07/25/2015 Elsevier Interactive Patient Education  2017 [place] Prevention in the Home Falls can cause injuries. They can happen to people of all ages. There are many things you can do to make your home safe and to help prevent falls. What can I do on the outside of my home?  Regularly fix the edges of walkways and driveways and fix any cracks.  Remove anything that might make you trip as you walk through a door, such as a raised step or threshold.  Trim any bushes or trees on the path to your home.  Use bright outdoor lighting.  Clear any walking paths of anything that might make someone trip, such as rocks or tools.  Regularly check to see if handrails are loose or broken. Make sure that both sides of any steps have handrails.  Any raised decks and porches should have guardrails on the edges.  Have any leaves, snow, or ice cleared regularly.  Use sand or salt on walking paths during winter.  Clean up any spills in your garage right away. This includes oil or grease spills. What can I do in the bathroom?  Use night lights.  Install grab bars by the toilet and in the tub and shower. Do not use towel bars as grab bars.  Use non-skid mats or decals in the tub or shower.  If you need to sit down in the shower, use a plastic, non-slip stool.  Keep the floor dry. Clean up any water that spills on the floor as soon as it happens.  Remove soap buildup in the tub or shower regularly.  Attach bath mats securely with double-sided  non-slip rug tape.  Do not have throw rugs and other things on the floor that can make you trip. What can I do in the bedroom?  Use night lights.  Make sure that you have a light by your bed that is easy to reach.  Do not use any sheets or blankets that are too big for your bed. They should not hang down onto the floor.  Have a firm chair that has side arms. You can use this for support while you get dressed.  Do not have throw rugs and other things on the floor that can make you trip. What can I do in the kitchen?  Clean up any spills right away.  Avoid walking on wet floors.  Keep items that you use a lot in easy-to-reach places.  If you need to reach something above you, use a strong step stool that has a grab bar.  Keep electrical cords out of the way.  Do not use floor polish or wax that makes floors slippery. If you must use wax, use non-skid floor wax.  Do not have throw rugs and other things on the floor that can make you trip. What can I do with my stairs?  Do not leave  any items on the stairs.  Make sure that there are handrails on both sides of the stairs and use them. Fix handrails that are broken or loose. Make sure that handrails are as long as the stairways.  Check any carpeting to make sure that it is firmly attached to the stairs. Fix any carpet that is loose or worn.  Avoid having throw rugs at the top or bottom of the stairs. If you do have throw rugs, attach them to the floor with carpet tape.  Make sure that you have a light switch at the top of the stairs and the bottom of the stairs. If you do not have them, ask someone to add them for you. What else can I do to help prevent falls?  Wear shoes that:  Do not have high heels.  Have rubber bottoms.  Are comfortable and fit you well.  Are closed at the toe. Do not wear sandals.  If you use a stepladder:  Make sure that it is fully opened. Do not climb a closed stepladder.  Make sure that both  sides of the stepladder are locked into place.  Ask someone to hold it for you, if possible.  Clearly mark and make sure that you can see:  Any grab bars or handrails.  First and last steps.  Where the edge of each step is.  Use tools that help you move around (mobility aids) if they are needed. These include:  Canes.  Walkers.  Scooters.  Crutches.  Turn on the lights when you go into a dark area. Replace any light bulbs as soon as they burn out.  Set up your furniture so you have a clear path. Avoid moving your furniture around.  If any of your floors are uneven, fix them.  If there are any pets around you, be aware of where they are.  Review your medicines with your doctor. Some medicines can make you feel dizzy. This can increase your chance of falling. Ask your doctor what other things that you can do to help prevent falls. This information is not intended to replace advice given to you by your health care provider. Make sure you discuss any questions you have with your health care provider. Document Released: 07/20/2009 Document Revised: 02/29/2016 Document Reviewed: 10/28/2014 Elsevier Interactive Patient Education  2017 [employer].

## 2017-05-22 ENCOUNTER — Non-Acute Institutional Stay (SKILLED_NURSING_FACILITY): Payer: Medicare Other | Admitting: Family

## 2017-05-22 ENCOUNTER — Encounter: Payer: Self-pay | Admitting: Family

## 2017-05-22 DIAGNOSIS — K7581 Nonalcoholic steatohepatitis (NASH): Secondary | ICD-10-CM | POA: Diagnosis not present

## 2017-05-22 DIAGNOSIS — E039 Hypothyroidism, unspecified: Secondary | ICD-10-CM

## 2017-05-22 DIAGNOSIS — K7460 Unspecified cirrhosis of liver: Secondary | ICD-10-CM | POA: Diagnosis not present

## 2017-05-22 DIAGNOSIS — F329 Major depressive disorder, single episode, unspecified: Secondary | ICD-10-CM | POA: Diagnosis not present

## 2017-05-22 DIAGNOSIS — F32A Depression, unspecified: Secondary | ICD-10-CM

## 2017-05-22 DIAGNOSIS — L89620 Pressure ulcer of left heel, unstageable: Secondary | ICD-10-CM | POA: Diagnosis not present

## 2017-05-22 NOTE — Progress Notes (Signed)
Location:  [facility] Room Number: 35 Place of Service:  SNF (31) Provider: Fate Galanti FNP-C   Blanchie Serve, MD  Patient Care Team: Blanchie Serve, MD as PCP - General (Internal Medicine) Cassy Sprowl, Nelda Bucks, NP as Nurse Practitioner (Family Medicine)  Extended Emergency Contact Information Primary Emergency Contact: Suszanne Conners Address: [address]          [place], [place] [address] [place] Phone: [phone] Relation: Spouse  Code Status:  DNR  Goals of care: Advanced Directive information Advanced Directives 05/22/2017  Does Patient Have a Medical Advance Directive? Yes  Type of Advance Directive Out of facility DNR (pink MOST or yellow form)  Does patient want to make changes to medical advance directive? -  Copy of [place] in Chart? -  Would patient like information on creating a medical advance directive? -  Pre-existing out of facility DNR order (yellow form or pink MOST form) Yellow form placed in chart (order not valid for inpatient use)     Chief Complaint  Patient presents with  . Medical Management of Chronic Issues    routine visit    HPI:  Pt is a 71 y.o. male seen today [facility] for medical management of chronic diseases. He has a medical history of HTN, Type 2 DM diet controlled, Sleep Apnea, Liver cirrhosis secondary to NASH, OA, CKD stage 4 among other conditions. He is seen in his room lying in bed. He denies any acute issues this visit. He states his appetite has improved. He continues to require assistance with ADL'S. Facility assistant states tends to shake whenever assisted to standing position. He continues to follow up with Hospice service due to end stage liver cirrhosis/NASH. No recent fall episodes or weight changes.     Past Medical History:  Diagnosis Date  . Abdominal hernia   . Ascites   . Benign neoplasm of colon   . Cirrhosis ([place])   . Diabetes mellitus,  type 2 ([place])    resolved with gastric bypass  . Diverticulosis of colon (without mention of hemorrhage)   . Esophageal varices ([place])   . Hyperlipemia   . Hypertension   . Iron deficiency anemia secondary to blood loss (chronic)   . Kidney stones   . Liver cyst   . Lymphopenia   . NASH (nonalcoholic steatohepatitis)   . Osteoarthritis of hip   . Renal cyst   . Splenomegaly   . Unspecified sleep apnea    Resolved   Past Surgical History:  Procedure Laterality Date  . ANKLE SURGERY     Bil  . COLONOSCOPY  2012  . GASTRIC BYPASS  2005  . KNEE SURGERY     Bil  . POLYPECTOMY    . TOTAL HIP ARTHROPLASTY  2008   right    Allergies  Allergen Reactions  . Oxycodone Other (See Comments)    Reaction:  Caused pt to fall     Allergies as of 05/22/2017      Reactions   Oxycodone Other (See Comments)   Reaction:  Caused pt to fall       Medication List       Accurate as of 05/22/17  1:23 PM. Always use your most recent med list.          acetaminophen 325 MG tablet Commonly known as:  TYLENOL Take 650 mg by mouth every 4 (four) hours as needed.   BOOST BREEZE PO  Take 1 Container by mouth 3 (three) times daily between meals.   diphenoxylate-atropine 2.5-0.025 MG tablet Commonly known as:  LOMOTIL Take 1 tablet by mouth 4 (four) times daily as needed for diarrhea or loose stools.   furosemide 20 MG tablet Commonly known as:  LASIX Take 20 mg by mouth daily.   lactulose 10 GM/15ML solution Commonly known as:  CHRONULAC Take 10 g by mouth 3 (three) times daily. Take 45 mL. Hold for >5 bowel movements per day   levothyroxine 25 MCG tablet Commonly known as:  SYNTHROID, LEVOTHROID Take 25 mcg by mouth daily before breakfast.   multivitamin with minerals tablet Take 1 tablet by mouth daily.   rifaximin 550 MG Tabs tablet Commonly known as:  XIFAXAN Take 550 mg by mouth 2 (two) times daily.   sodium bicarbonate 650 MG tablet Take 650 mg by mouth daily.     spironolactone 100 MG tablet Commonly known as:  ALDACTONE Take 1 tablet (100 mg total) by mouth daily.   ZINC OXIDE EX Apply 1 application topically as needed.   ZOLOFT PO Take 50 mg by mouth at bedtime.       Review of Systems  Constitutional: Positive for fatigue. Negative for activity change, appetite change, chills and fever.  HENT: Negative for congestion, rhinorrhea, sinus pain, sinus pressure, sneezing, sore throat and trouble swallowing.   Eyes: Negative.   Respiratory: Negative for cough, chest tightness, shortness of breath and wheezing.   Cardiovascular: Negative for chest pain, palpitations and leg swelling.  Gastrointestinal: Positive for abdominal distention. Negative for abdominal pain, constipation, diarrhea, nausea and vomiting.       Abdominal hernia   Endocrine: Negative for cold intolerance, heat intolerance, polydipsia, polyphagia and polyuria.  Genitourinary: Negative for dysuria, flank pain, frequency and urgency.  Musculoskeletal: Positive for gait problem.  Skin: Negative for color change, pallor and rash.  Neurological: Negative for dizziness, seizures, light-headedness and headaches.        weakness  Hematological: Does not bruise/bleed easily.  Psychiatric/Behavioral: Negative for agitation, confusion, hallucinations, sleep disturbance and suicidal ideas. The patient is not nervous/anxious.      There is no immunization history on file for this patient. Pertinent  Health Maintenance Due  Topic Date Due  . FOOT EXAM  12/29/1955  . OPHTHALMOLOGY EXAM  12/29/1955  . URINE MICROALBUMIN  [birth year]29/1955  . INFLUENZA VACCINE  05/07/2017  . HEMOGLOBIN A1C  06/22/2017  . COLONOSCOPY  08/03/2019  . PNA vac Low Risk Adult  Completed   Fall Risk  05/19/2017  Falls in the past year? No    Vitals:   05/22/17 1027  BP: 93/60  Pulse: 83  Resp: (!) 22  Temp: 98.5 F (36.9 C)  SpO2: 94%  Weight: 165 lb (74.8 kg)  Height: 6\' 2"  (1.88 m)   Body mass  index is 21.18 kg/m. Physical Exam  Constitutional: He is oriented to person, place, and time.  Frail elderly in no acute distress lying in bed.   HENT:  Head: Normocephalic.  Right Ear: External ear normal.  Left Ear: External ear normal.  Mouth/Throat: Oropharynx is clear and moist. No oropharyngeal exudate.  Eyes: Pupils are equal, round, and reactive to light. Conjunctivae and EOM are normal. Right eye exhibits no discharge. Left eye exhibits no discharge. No scleral icterus.  Neck: Normal range of motion. No JVD present. No thyromegaly present.  Cardiovascular: Normal rate, regular rhythm, normal heart sounds and intact distal pulses.  Exam reveals no gallop  and no friction rub.   No murmur heard. Pulmonary/Chest: Effort normal and breath sounds normal. No respiratory distress. He has no wheezes. He has no rales.  Abdominal: Soft. Bowel sounds are normal. He exhibits distension. There is no tenderness. There is no rebound and no guarding.  Reducible abdominal hernia   Musculoskeletal: He exhibits no edema, tenderness or deformity.  Moves x 4 extremities.unsteady gait   Lymphadenopathy:    He has no cervical adenopathy.  Neurological: He is oriented to person, place, and time.  Generalized weakness   Skin: Skin is warm and dry. No rash noted. No erythema. No pallor.  # 1. Sacral previous ulcer whitish scab with surrounding skin tissue peeling. No signs of infection.   # 2. Left heel quarter size dark blister area none tender to touch without any signs of infections.    Psychiatric: He has a normal mood and affect.    Labs reviewed:  Recent Labs  02/13/17 0507 02/14/17 0519  03/19/17 1556  03/31/17 04/07/17 04/13/17 05/08/17  NA 139 136  < > 136  < > 136* 135* 137 137  K 4.0 4.1  < > 5.2*  < > 4.6 4.6 4.9 5.0  CL 116* 111  --  106  --   --   --   --   --   CO2 20* 18*  --  24  --   --   --   --   --   GLUCOSE 123* 137*  --  232*  --   --   --   --   --   BUN 23* 24*  < >  37*  < > 43* 39* 37* 41*  CREATININE 1.31* 1.33*  < > 1.85*  < > 1.9* 1.7* 1.6* 1.5*  CALCIUM 8.1* 8.2*  --  8.8  --  8.2  --   --   --   < > = values in this interval not displayed.  Recent Labs  02/12/17 0521 02/13/17 0507 02/14/17 0519  03/27/17 03/31/17 04/07/17  AST 32 35 38  < > 25 21 26   ALT 27 30 33  < > 26 21 27   ALKPHOS 112 111 111  < > 95 89 94  BILITOT 1.4* 1.6* 1.5*  --   --   --   --   PROT 5.2* 5.0* 4.9*  --   --  5.3  --   ALBUMIN 3.0* 2.9* 2.8*  --   --  2.9  --   < > = values in this interval not displayed.  Recent Labs  11/13/16 1704  12/20/16 0137  12/23/16 0539  02/11/17 2005 02/12/17 0521 02/13/17 0507  02/24/17 03/27/17 03/31/17  WBC 8.2  < > 3.8*  < > 2.0*  < > 2.8* 2.2* 2.2*  < > 2.0 2.7 2.0  NEUTROABS 8.0*  --  3.4  --  1.4*  --   --   --   --   --   --   --   --   HGB 10.8*  < > 10.7*  < > 8.8*  < > 11.2* 9.5* 9.9*  < > 9.4* 10.8* 10.4*  HCT 30.7*  < > 30.3*  < > 24.9*  < > 32.7* 27.7* 27.9*  < > 28* 31* 30*  MCV 88.7  < > 88.3  < > 85.0  < > 87.4 86.8 86.9  --   --   --   --   PLT 36*  < >  38*  < > 31*  < > 44* 39* 41*  < > 37* 36* 37*  < > = values in this interval not displayed. Lab Results  Component Value Date   TSH 10.43 (A) 04/07/2017   Lab Results  Component Value Date   HGBA1C 5.2 12/20/2016   Significant Diagnostic Results in last 30 days:  No results found.  Assessment/Plan 1. Decubitus ulcer of left heel, unstageable  Afebrile.quarter size dark blister area none tender to touch without any signs of infections.Enourage to reposition frequently in the bed. Continue on protein supplement. High risk for ulcer with his deconditioning.Will benefit from provolone boots to offload heels.Provider Gel mattress and cushion for wheelchair to prevent further skin breakdown.      2. Hypothyroidism Continue on levothyroxine 25 mcg tablet daily. TSH level scheduled already in 4 weeks.   3. Depression Stable.Continues to spend most time in the  room though feeling much better. Continue on sertraline 50 mg Tablet at bedtime. Monitor for mood changes.   4. Liver cirrhosis secondary to NASH Progressive decline expected. Continue on rifaximin 550 mg tablet twice daily,Furosemide 20 mg tablet daily and spironolactone 100 mg Tablet daily.   Family/ staff Communication: Reviewed plan of care with patient and facility Nurse.   Labs/tests ordered: None   Kailynn Satterly C Wanita Derenzo, NP

## 2017-06-12 DIAGNOSIS — I10 Essential (primary) hypertension: Secondary | ICD-10-CM | POA: Diagnosis not present

## 2017-06-12 LAB — TSH: TSH: 2.49 (ref 0.41–5.90)

## 2017-06-23 ENCOUNTER — Non-Acute Institutional Stay (SKILLED_NURSING_FACILITY): Payer: Medicare Other | Admitting: Internal Medicine

## 2017-06-23 ENCOUNTER — Encounter: Payer: Self-pay | Admitting: Internal Medicine

## 2017-06-23 DIAGNOSIS — F339 Major depressive disorder, recurrent, unspecified: Secondary | ICD-10-CM

## 2017-06-23 DIAGNOSIS — L89620 Pressure ulcer of left heel, unstageable: Secondary | ICD-10-CM | POA: Insufficient documentation

## 2017-06-23 DIAGNOSIS — I10 Essential (primary) hypertension: Secondary | ICD-10-CM

## 2017-06-23 DIAGNOSIS — D696 Thrombocytopenia, unspecified: Secondary | ICD-10-CM

## 2017-06-23 DIAGNOSIS — K7460 Unspecified cirrhosis of liver: Secondary | ICD-10-CM | POA: Diagnosis not present

## 2017-06-23 DIAGNOSIS — N184 Chronic kidney disease, stage 4 (severe): Secondary | ICD-10-CM

## 2017-06-23 DIAGNOSIS — R5381 Other malaise: Secondary | ICD-10-CM

## 2017-06-23 DIAGNOSIS — K7581 Nonalcoholic steatohepatitis (NASH): Secondary | ICD-10-CM | POA: Diagnosis not present

## 2017-06-23 NOTE — Progress Notes (Signed)
Location:  [facility] Room Number: 35 Place of Service:  SNF [phone]) Provider:  Blanchie Serve MD  Blanchie Serve, MD  Patient Care Team: Blanchie Serve, MD as PCP - General (Internal Medicine) Ngetich, Nelda Bucks, NP as Nurse Practitioner (Family Medicine)  Extended Emergency Contact Information Primary Emergency Contact: Suszanne Conners Address: [address]          [place],  [address] [place] Phone: [phone] Relation: Spouse  Code Status:  DNR  Goals of care: Advanced Directive information Advanced Directives 06/23/2017  Does Patient Have a Medical Advance Directive? Yes  Type of Paramedic of [place];Out of facility DNR (pink MOST or yellow form)  Does patient want to make changes to medical advance directive? No - Patient declined  Copy of [place] in Chart? Yes  Would patient like information on creating a medical advance directive? -  Pre-existing out of facility DNR order (yellow form or pink MOST form) Yellow form placed in chart (order not valid for inpatient use);Pink MOST form placed in chart (order not valid for inpatient use)     Chief Complaint  Patient presents with  . Medical Management of Chronic Issues    Routine Visit     HPI:  Pt is a 71 y.o. male seen today for medical management of chronic diseases. He is seen in his room today. He denies any concern. He is in his room all day. He uses his walker for short distances. He participates minimally in conversation. No concern from nursing.    Past Medical History:  Diagnosis Date  . Abdominal hernia   . Ascites   . Benign neoplasm of colon   . Cirrhosis ([place])   . Diabetes mellitus, type 2 ([place])    resolved with gastric bypass  . Diverticulosis of colon (without mention of hemorrhage)   . Esophageal varices ([facility])   . Hyperlipemia   . Hypertension   . Iron deficiency anemia secondary to blood loss (chronic)    . Kidney stones   . Liver cyst   . Lymphopenia   . NASH (nonalcoholic steatohepatitis)   . Osteoarthritis of hip   . Renal cyst   . Splenomegaly   . Unspecified sleep apnea    Resolved   Past Surgical History:  Procedure Laterality Date  . ANKLE SURGERY     Bil  . COLONOSCOPY  2012  . GASTRIC BYPASS  2005  . KNEE SURGERY     Bil  . POLYPECTOMY    . TOTAL HIP ARTHROPLASTY  2008   right    Allergies  Allergen Reactions  . Oxycodone Other (See Comments)    Reaction:  Caused pt to fall     Outpatient Encounter Prescriptions as of 06/23/2017  Medication Sig  . acetaminophen (TYLENOL) 325 MG tablet Take 650 mg by mouth every 4 (four) hours as needed.  . diphenoxylate-atropine (LOMOTIL) 2.5-0.025 MG tablet Take 1 tablet by mouth 4 (four) times daily as needed for diarrhea or loose stools.  . furosemide (LASIX) 20 MG tablet Take 20 mg by mouth daily.  Marland Kitchen lactulose (CHRONULAC) 10 GM/15ML solution Take 10 g by mouth 3 (three) times daily. Take 45 mL. Hold for >5 bowel movements per day   . levothyroxine (SYNTHROID, LEVOTHROID) 25 MCG tablet Take 25 mcg by mouth daily before breakfast.  . Multiple Vitamins-Minerals (MULTIVITAMIN WITH MINERALS) tablet Take 1 tablet by mouth daily.  . Nutritional Supplements (BOOST  BREEZE PO) Take 1 Container by mouth 3 (three) times daily between meals.   . rifaximin (XIFAXAN) 550 MG TABS tablet Take 550 mg by mouth 2 (two) times daily.  . Sertraline HCl (ZOLOFT PO) Take 50 mg by mouth at bedtime.   . sodium bicarbonate 650 MG tablet Take 650 mg by mouth daily.   Marland Kitchen spironolactone (ALDACTONE) 100 MG tablet Take 1 tablet (100 mg total) by mouth daily.  Marland Kitchen ZINC OXIDE EX Apply 1 application topically as needed.   No facility-administered encounter medications on file as of 06/23/2017.     Review of Systems  Constitutional: Negative for appetite change, diaphoresis and fever.       He feeds himself, takes his medications whole, followed by hospice  services  HENT: Negative for congestion, hearing loss, mouth sores and trouble swallowing.   Respiratory: Negative for cough and shortness of breath.   Cardiovascular: Negative for chest pain, palpitations and leg swelling.  Gastrointestinal: Negative for abdominal pain, constipation, nausea and vomiting.  Genitourinary: Negative for dysuria.       Has urinary incontinence  Musculoskeletal: Positive for gait problem. Negative for arthralgias and back pain.       No fall reported, gets around with his walker for short distance, needs 1 person assistance  Neurological: Negative for dizziness, syncope and headaches.  Hematological: Bruises/bleeds easily.  Psychiatric/Behavioral: Positive for confusion. Negative for behavioral problems.     There is no immunization history on file for this patient. Pertinent  Health Maintenance Due  Topic Date Due  . FOOT EXAM  12/29/1955  . OPHTHALMOLOGY EXAM  [birth year]29/1955  . URINE MICROALBUMIN  12/29/1955  . HEMOGLOBIN A1C  06/22/2017  . INFLUENZA VACCINE  07/07/2017 (Originally 05/07/2017)  . COLONOSCOPY  08/03/2019  . PNA vac Low Risk Adult  Completed   Fall Risk  05/19/2017  Falls in the past year? No   Functional Status Survey:    Vitals:   06/23/17 1030  BP: 102/64  Pulse: 76  Resp: 18  Temp: 98.5 F (36.9 C)  TempSrc: Oral  SpO2: 97%  Weight: 165 lb (74.8 kg)  Height: 6\' 2"  (1.88 m)   Body mass index is 21.18 kg/m. Physical Exam  Constitutional: No distress.  Thin built male  HENT:  Head: Normocephalic and atraumatic.  Nose: Nose normal.  Mouth/Throat: Oropharynx is clear and moist. No oropharyngeal exudate.  Eyes: Pupils are equal, round, and reactive to light. Conjunctivae and EOM are normal. Right eye exhibits no discharge. Left eye exhibits no discharge.  Neck: Normal range of motion. Neck supple. No thyromegaly present.  Cardiovascular: Normal rate and regular rhythm.   Pulmonary/Chest: Effort normal and breath sounds  normal. No respiratory distress. He has no wheezes. He has no rales.  Abdominal: Soft. Bowel sounds are normal. He exhibits distension. There is no tenderness. There is no rebound and no guarding.  Musculoskeletal: He exhibits no edema.  Can move all 4 extremities, uses his walker  Lymphadenopathy:    He has no cervical adenopathy.  Neurological: He is alert.  Oriented to self and place only  Skin: Skin is warm and dry. He is not diaphoretic.  left heel pressure ulcer 2 x 2.5 cm with eschar, heel floaters to both heels  Psychiatric:  Quiet, makes good eye contact, more interactive than before     Labs reviewed:  Recent Labs  02/13/17 0507 02/14/17 0519  03/19/17 1556  03/31/17 04/07/17 04/13/17 05/08/17  NA 139 136  < > 136  < >  136* 135* 137  137 137  K 4.0 4.1  < > 5.2*  < > 4.6 4.6 4.9  4.9 5.0  CL 116* 111  --  106  --   --   --   --   --   CO2 20* 18*  --  24  --   --   --   --   --   GLUCOSE 123* 137*  --  232*  --   --   --   --   --   BUN 23* 24*  < > 37*  < > 43* 39* 37*  37* 41*  CREATININE 1.31* 1.33*  < > 1.85*  < > 1.9* 1.7* 1.6*  1.6* 1.5*  CALCIUM 8.1* 8.2*  --  8.8  --  8.2  --   --   --   < > = values in this interval not displayed.  Recent Labs  02/12/17 0521 02/13/17 0507 02/14/17 0519  03/27/17 03/31/17 04/07/17  AST 32 35 38  < > 25 21 26   ALT 27 30 33  < > 26 21 27   ALKPHOS 112 111 111  < > 95 89 94  BILITOT 1.4* 1.6* 1.5*  --   --   --   --   PROT 5.2* 5.0* 4.9*  --   --  5.3  --   ALBUMIN 3.0* 2.9* 2.8*  --   --  2.9  --   < > = values in this interval not displayed.  Recent Labs  11/13/16 1704  12/20/16 0137  12/23/16 0539  02/11/17 2005 02/12/17 0521 02/13/17 0507  02/24/17 03/27/17 03/31/17  WBC 8.2  < > 3.8*  < > 2.0*  < > 2.8* 2.2* 2.2*  < > 2.0 2.7 2.0  NEUTROABS 8.0*  --  3.4  --  1.4*  --   --   --   --   --   --   --   --   HGB 10.8*  < > 10.7*  < > 8.8*  < > 11.2* 9.5* 9.9*  < > 9.4* 10.8* 10.4*  HCT 30.7*  < > 30.3*  < >  24.9*  < > 32.7* 27.7* 27.9*  < > 28* 31* 30*  MCV 88.7  < > 88.3  < > 85.0  < > 87.4 86.8 86.9  --   --   --   --   PLT 36*  < > 38*  < > 31*  < > 44* 39* 41*  < > 37* 36* 37*  < > = values in this interval not displayed. Lab Results  Component Value Date   TSH 2.49 06/12/2017   Lab Results  Component Value Date   HGBA1C 5.2 12/20/2016   No results found for: CHOL, HDL, LDLCALC, LDLDIRECT, TRIG, CHOLHDL  Significant Diagnostic Results in last 30 days:  No results found.  Assessment/Plan  Physical deconditioning Ongoing, improved strength from my prior visit. he is OOB daily. provide supportive care, fall precautions. Followed by hospice services  Hypertension On chart review, all BP reading below 120/80. He is currently on lasix and spironolactone that can effect his BP. Decrease spironolactone to 50 mg daily and monitor  Liver cirrhosis with NASH Wt Readings from Last 3 Encounters:  06/23/17 165 lb (74.8 kg)  05/22/17 165 lb (74.8 kg)  05/19/17 167 lb (75.8 kg)   Has ascites, breathing stable, goal is for comfort care. Weight stable. Soft BP readings on  review. Continue rifaxamin and sodium bicarbonate. Continue furosemide with spironolactone decreased dosing as above. Continue his lactulose. No loose stool reported.   unstageable pressure ulcer To left heel with eschar, off loading pressure to his heels with pressure ulcer boots while in bed to his heel daily. Pressure ulcer prophylaxis with wheel chair Gel Cushion and Air mattress to prevent further skin breakdown due to decreased mobility. Monitor nutritional intake as well  Thrombocytopenia With his liver cirrhosis, no bleed reported, provide skin care and monitor for bleed  Chronic depression Improved mood somewhat. Continue sertraline and supportive care  CKD stage 4 Monitor urine output, avoid NSAIDs.    Family/ staff Communication: reviewed care plan with patient and charge nurse.    Labs/tests ordered:   none   Blanchie Serve, MD Internal Medicine [facility] [address] [place], [place] [address] Cell Phone (Monday-Friday 8 am - 5 pm): [phone] On Call: [phone] and follow prompts after 5 pm and on weekends Office Phone: [phone] Office Fax: [phone]

## 2017-07-23 ENCOUNTER — Non-Acute Institutional Stay (SKILLED_NURSING_FACILITY): Payer: Medicare Other | Admitting: Family

## 2017-07-23 ENCOUNTER — Encounter: Payer: Self-pay | Admitting: Family

## 2017-07-23 DIAGNOSIS — K7581 Nonalcoholic steatohepatitis (NASH): Secondary | ICD-10-CM | POA: Diagnosis not present

## 2017-07-23 DIAGNOSIS — K7460 Unspecified cirrhosis of liver: Secondary | ICD-10-CM | POA: Diagnosis not present

## 2017-07-23 DIAGNOSIS — L89621 Pressure ulcer of left heel, stage 1: Secondary | ICD-10-CM | POA: Diagnosis not present

## 2017-07-23 DIAGNOSIS — L89151 Pressure ulcer of sacral region, stage 1: Secondary | ICD-10-CM | POA: Diagnosis not present

## 2017-07-23 DIAGNOSIS — F339 Major depressive disorder, recurrent, unspecified: Secondary | ICD-10-CM

## 2017-07-23 NOTE — Progress Notes (Signed)
Location:  [facility] Room Number: 35 Place of Service:  SNF (31) Provider: Reagen Haberman FNP-C   Blanchie Serve, MD  Patient Care Team: Blanchie Serve, MD as PCP - General (Internal Medicine) Lady Wisham, Nelda Bucks, NP as Nurse Practitioner (Family Medicine)  Extended Emergency Contact Information Primary Emergency Contact: Suszanne Conners Address: [address]          [place], [place] [address] [place] Phone: [phone] Relation: Spouse  Code Status:  DNR Goals of care: Advanced Directive information Advanced Directives 07/23/2017  Does Patient Have a Medical Advance Directive? Yes  Type of Advance Directive Out of facility DNR (pink MOST or yellow form);Healthcare Power of Attorney  Does patient want to make changes to medical advance directive? -  Copy of [place] in Chart? No - copy requested  Would patient like information on creating a medical advance directive? -  Pre-existing out of facility DNR order (yellow form or pink MOST form) Yellow form placed in chart (order not valid for inpatient use)     Chief Complaint  Patient presents with  . Medical Management of Chronic Issues    routine visit; also has Stage 1 pressure ulcer     HPI:  Pt is a 71 y.o. male seen today [facility] for medical management of chronic diseases.He has a medical history of HTN,CAD, Liver cirrhosis,CKD stage 4,OA, Anemia among other conditions. He is seen in his room today with facility Nurse present. Nurse reports patient has a new skin breakdown on his coccyx. He continues to get out of bed with assistance and walks short distance with his walker. He denies any acute pain during visit. He has 2-3 bowel movements per day no diarrhea reported.He has had a two pound weight gain over one month. No cough or shortness of breath reported.     Past Medical History:  Diagnosis Date  . Abdominal hernia   . Ascites   . Benign  neoplasm of colon   . Cirrhosis ([place])   . Diabetes mellitus, type 2 ([place])    resolved with gastric bypass  . Diverticulosis of colon (without mention of hemorrhage)   . Esophageal varices ([place])   . Hyperlipemia   . Hypertension   . Iron deficiency anemia secondary to blood loss (chronic)   . Kidney stones   . Liver cyst   . Lymphopenia   . NASH (nonalcoholic steatohepatitis)   . Osteoarthritis of hip   . Renal cyst   . Splenomegaly   . Unspecified sleep apnea    Resolved   Past Surgical History:  Procedure Laterality Date  . ANKLE SURGERY     Bil  . COLONOSCOPY  2012  . GASTRIC BYPASS  2005  . KNEE SURGERY     Bil  . POLYPECTOMY    . TOTAL HIP ARTHROPLASTY  2008   right    Allergies  Allergen Reactions  . Oxycodone Other (See Comments)    Reaction:  Caused pt to fall     Allergies as of 07/23/2017      Reactions   Oxycodone Other (See Comments)   Reaction:  Caused pt to fall       Medication List       Accurate as of 07/23/17 12:05 PM. Always use your most recent med list.          acetaminophen 325 MG tablet Commonly known as:  TYLENOL Take 650 mg by mouth every  4 (four) hours as needed.   BOOST BREEZE PO Take 1 Container by mouth 3 (three) times daily between meals.   diphenoxylate-atropine 2.5-0.025 MG tablet Commonly known as:  LOMOTIL Take 1 tablet by mouth 4 (four) times daily as needed for diarrhea or loose stools.   furosemide 20 MG tablet Commonly known as:  LASIX Take 20 mg by mouth daily.   lactulose 10 GM/15ML solution Commonly known as:  CHRONULAC Take 10 g by mouth 3 (three) times daily. Take 45 mL. Hold for >5 bowel movements per day   levothyroxine 25 MCG tablet Commonly known as:  SYNTHROID, LEVOTHROID Take 25 mcg by mouth daily before breakfast.   multivitamin with minerals tablet Take 1 tablet by mouth daily.   rifaximin 550 MG Tabs tablet Commonly known as:  XIFAXAN Take 550 mg by mouth 2 (two) times daily.   sodium  bicarbonate 650 MG tablet Take 650 mg by mouth daily.   spironolactone 50 MG tablet Commonly known as:  ALDACTONE Take 50 mg by mouth daily.   ZINC OXIDE EX Apply 1 application topically as needed.   ZOLOFT PO Take 50 mg by mouth at bedtime.       Review of Systems  Constitutional: Negative for activity change, appetite change, chills, fatigue and fever.  HENT: Negative for congestion, rhinorrhea, sinus pain, sinus pressure, sneezing, sore throat and trouble swallowing.   Eyes: Negative for discharge and redness.  Respiratory: Negative for cough, chest tightness, shortness of breath and wheezing.   Cardiovascular: Negative for chest pain, palpitations and leg swelling.  Gastrointestinal: Positive for abdominal distention. Negative for abdominal pain, constipation, diarrhea, nausea and vomiting.  Endocrine: Negative for cold intolerance, heat intolerance, polydipsia, polyphagia and polyuria.  Genitourinary: Negative for dysuria, flank pain and urgency.  Musculoskeletal: Positive for gait problem.  Skin: Negative for color change, pallor and rash.       Pressure ulcer to coccyx and left heel   Neurological: Negative for dizziness, seizures, syncope, light-headedness and headaches.  Hematological: Does not bruise/bleed easily.  Psychiatric/Behavioral: Positive for confusion. Negative for agitation and sleep disturbance. The patient is not nervous/anxious.     Immunization History  Administered Date(s) Administered  . Influenza-Unspecified 07/17/2017   Pertinent  Health Maintenance Due  Topic Date Due  . FOOT EXAM  12/29/1955  . OPHTHALMOLOGY EXAM  12/29/1955  . URINE MICROALBUMIN  [birth year]29/1955  . HEMOGLOBIN A1C  06/22/2017  . COLONOSCOPY  08/03/2019  . INFLUENZA VACCINE  Completed  . PNA vac Low Risk Adult  Completed   Fall Risk  05/19/2017  Falls in the past year? No    Vitals:   07/23/17 1032  BP: 127/64  Pulse: 78  Resp: 18  Temp: 98.2 F (36.8 C)  SpO2: 97%    Weight: 167 lb (75.8 kg)  Height: 6\' 2"  (1.88 m)   Body mass index is 21.44 kg/m. Physical Exam  Constitutional: He appears well-developed and well-nourished. No distress.  Elderly in no acute distress  HENT:  Head: Normocephalic.  Mouth/Throat: Oropharynx is clear and moist. No oropharyngeal exudate.  Eyes: Pupils are equal, round, and reactive to light. Conjunctivae and EOM are normal. Right eye exhibits no discharge. Left eye exhibits no discharge. No scleral icterus.  Neck: Normal range of motion. No tracheal deviation present. No thyromegaly present.  Cardiovascular: Normal rate, regular rhythm, normal heart sounds and intact distal pulses.  Exam reveals no gallop and no friction rub.   No murmur heard. Pulmonary/Chest: Effort normal and  breath sounds normal. No respiratory distress. He has no wheezes. He has no rales.  Abdominal: Soft. Bowel sounds are normal. He exhibits distension. There is no tenderness. There is no rebound and no guarding.  Genitourinary:  Genitourinary Comments: Incontinent for both bowel and bladder  Musculoskeletal: He exhibits no edema or tenderness.  Unsteady gait walks short distance with walker and assist.   Lymphadenopathy:    He has no cervical adenopathy.  Neurological: Coordination normal.  Alert and oriented to person and place but not time.   Skin: Skin is warm and dry. No rash noted. No erythema. No pallor.  # 1. Coccyx stage 1 pressure ulcer 0.5 cm X 0.5 cm wound bed red without any drainage. surrounding skin tissues without any redness or swelling.   # 2. Left heel stage 1 pressure ulcer x 2 open areas wound bed red granulation in color without any drainage or signs of infection noted. Bilateral off loading boots in place.   Psychiatric: He has a normal mood and affect.    Labs reviewed:  Recent Labs  02/13/17 0507 02/14/17 0519  03/19/17 1556  03/31/17 04/07/17 04/13/17 05/08/17  NA 139 136  < > 136  < > 136* 135* 137  137 137  K  4.0 4.1  < > 5.2*  < > 4.6 4.6 4.9  4.9 5.0  CL 116* 111  --  106  --   --   --   --   --   CO2 20* 18*  --  24  --   --   --   --   --   GLUCOSE 123* 137*  --  232*  --   --   --   --   --   BUN 23* 24*  < > 37*  < > 43* 39* 37*  37* 41*  CREATININE 1.31* 1.33*  < > 1.85*  < > 1.9* 1.7* 1.6*  1.6* 1.5*  CALCIUM 8.1* 8.2*  --  8.8  --  8.2  --   --   --   < > = values in this interval not displayed.  Recent Labs  02/12/17 0521 02/13/17 0507 02/14/17 0519  03/27/17 03/31/17 04/07/17  AST 32 35 38  < > 25 21 26   ALT 27 30 33  < > 26 21 27   ALKPHOS 112 111 111  < > 95 89 94  BILITOT 1.4* 1.6* 1.5*  --   --   --   --   PROT 5.2* 5.0* 4.9*  --   --  5.3  --   ALBUMIN 3.0* 2.9* 2.8*  --   --  2.9  --   < > = values in this interval not displayed.  Recent Labs  11/13/16 1704  12/20/16 0137  12/23/16 0539  02/11/17 2005 02/12/17 0521 02/13/17 0507  02/24/17 03/27/17 03/31/17  WBC 8.2  < > 3.8*  < > 2.0*  < > 2.8* 2.2* 2.2*  < > 2.0 2.7 2.0  NEUTROABS 8.0*  --  3.4  --  1.4*  --   --   --   --   --   --   --   --   HGB 10.8*  < > 10.7*  < > 8.8*  < > 11.2* 9.5* 9.9*  < > 9.4* 10.8* 10.4*  HCT 30.7*  < > 30.3*  < > 24.9*  < > 32.7* 27.7* 27.9*  < > 28* 31* 30*  MCV 88.7  < >  88.3  < > 85.0  < > 87.4 86.8 86.9  --   --   --   --   PLT 36*  < > 38*  < > 31*  < > 44* 39* 41*  < > 37* 36* 37*  < > = values in this interval not displayed. Lab Results  Component Value Date   TSH 2.49 06/12/2017   Lab Results  Component Value Date   HGBA1C 5.2 12/20/2016    Significant Diagnostic Results in last 30 days:  No results found.  Assessment/Plan 1. Pressure ulcer of sacral region, stage 1 pressure ulcer 0.5 cm X 0.5 cm wound bed red without any drainage. surrounding skin tissues without any redness or swelling.Cleanse PU with saline, pat dry and cover with Allevyn foam dressing. Change dressing every 3 days and as needed if soiled. Continue on pressure relieving mattress. Continue MVI  and nutrition supplements.      2. Pressure ulcer of left heel, stage 1  x 2 open areas wound bed red granulation without any drainage or signs of infection noted.continue current dressing changes. Continue to off load heels.    3. Liver cirrhosis secondary to NASH Has had 2 pound weight gain over one month. No edema,shortness of breath.Has 2-3 bowel movements per day.continue on rifaximin 550 mg tablet twice daily, Lactulose 10 gm three times daily, Furosemide 20 mg tablet daily, spironolactone 50 mg tablet daily and sodium Bicarbonate 650 mg daily. Continue to follow up with Hospice service.    4. Major depression Mood stable though pends most time in the room.continue on sertraline 50 mg tablet daily. Monitor for mood changes.  Family/ staff Communication: Reviewed plan of care with patient and facility Nurse.  Labs/tests ordered: None   Ollis Daudelin C Tandra Rosado, NP

## 2017-08-20 ENCOUNTER — Non-Acute Institutional Stay (SKILLED_NURSING_FACILITY): Payer: Medicare Other | Admitting: Internal Medicine

## 2017-08-20 ENCOUNTER — Encounter: Payer: Self-pay | Admitting: Internal Medicine

## 2017-08-20 DIAGNOSIS — K7581 Nonalcoholic steatohepatitis (NASH): Secondary | ICD-10-CM

## 2017-08-20 DIAGNOSIS — F339 Major depressive disorder, recurrent, unspecified: Secondary | ICD-10-CM

## 2017-08-20 DIAGNOSIS — E039 Hypothyroidism, unspecified: Secondary | ICD-10-CM

## 2017-08-20 DIAGNOSIS — K7460 Unspecified cirrhosis of liver: Secondary | ICD-10-CM

## 2017-08-20 NOTE — Progress Notes (Signed)
Location:  [facility] Room Number: 35 Place of Service:  SNF [phone]) Provider:  Blanchie Serve MD  Blanchie Serve, MD  Patient Care Team: Blanchie Serve, MD as PCP - General (Internal Medicine) Ngetich, Nelda Bucks, NP as Nurse Practitioner (Family Medicine)  Extended Emergency Contact Information Primary Emergency Contact: Suszanne Conners Address: [address]          [place], [place] [address] [place] Phone: [phone] Relation: Spouse  Code Status:  DNR  Goals of care: Advanced Directive information Advanced Directives 08/20/2017  Does Patient Have a Medical Advance Directive? Yes  Type of Paramedic of [place];Out of facility DNR (pink MOST or yellow form)  Does patient want to make changes to medical advance directive? No - Patient declined  Copy of [place] in Chart? Yes  Would patient like information on creating a medical advance directive? -  Pre-existing out of facility DNR order (yellow form or pink MOST form) Yellow form placed in chart (order not valid for inpatient use);Pink MOST form placed in chart (order not valid for inpatient use)     Chief Complaint  Patient presents with  . Medical Management of Chronic Issues    Routine Visit     HPI:  Pt is a 71 y.o. male seen today for medical management of chronic diseases. His mood has improved. He is sleeping well at night. He is OOB daily and gets around with his walker for short distance. He needs assistance with transfer. No fall reported. No diarrhea reported but he moves his bowel 2-3 times a day. He has gained several pounds. He denies any abdominal pain or dyspnea. Left heel pressure ulcer has resolved. He is under hospice services.    Past Medical History:  Diagnosis Date  . Abdominal hernia   . Ascites   . Benign neoplasm of colon   . Cirrhosis ([facility])   . Diabetes mellitus, type 2 ([facility])    resolved with gastric  bypass  . Diverticulosis of colon (without mention of hemorrhage)   . Esophageal varices ([facility])   . Hyperlipemia   . Hypertension   . Iron deficiency anemia secondary to blood loss (chronic)   . Kidney stones   . Liver cyst   . Lymphopenia   . NASH (nonalcoholic steatohepatitis)   . Osteoarthritis of hip   . Renal cyst   . Splenomegaly   . Unspecified sleep apnea    Resolved   Past Surgical History:  Procedure Laterality Date  . ANKLE SURGERY     Bil  . COLONOSCOPY  2012  . GASTRIC BYPASS  2005  . KNEE SURGERY     Bil  . POLYPECTOMY    . TOTAL HIP ARTHROPLASTY  2008   right    Allergies  Allergen Reactions  . Oxycodone Other (See Comments)    Reaction:  Caused pt to fall     Outpatient Encounter Medications as of 08/20/2017  Medication Sig  . acetaminophen (TYLENOL) 325 MG tablet Take 650 mg by mouth every 4 (four) hours as needed.  . diphenoxylate-atropine (LOMOTIL) 2.5-0.025 MG tablet Take 1 tablet by mouth 4 (four) times daily as needed for diarrhea or loose stools.  . furosemide (LASIX) 20 MG tablet Take 20 mg by mouth daily.  Marland Kitchen lactulose (CHRONULAC) 10 GM/15ML solution Take 10 g 3 (three) times daily by mouth. Take 45 mL. Hold for >3 bowel movements per day  .  levothyroxine (SYNTHROID, LEVOTHROID) 25 MCG tablet Take 25 mcg by mouth daily before breakfast.  . Multiple Vitamins-Minerals (MULTIVITAMIN WITH MINERALS) tablet Take 1 tablet by mouth daily.  . Nutritional Supplements (BOOST BREEZE PO) Take 1 Container by mouth 3 (three) times daily between meals.   . rifaximin (XIFAXAN) 550 MG TABS tablet Take 550 mg by mouth 2 (two) times daily.  . Sertraline HCl (ZOLOFT PO) Take 50 mg by mouth at bedtime.   . sodium bicarbonate 650 MG tablet Take 650 mg by mouth daily.   Marland Kitchen spironolactone (ALDACTONE) 50 MG tablet Take 50 mg by mouth daily.  Marland Kitchen ZINC OXIDE EX Apply 1 application 3 (three) times daily as needed topically.    No facility-administered encounter medications on  file as of 08/20/2017.     Review of Systems  Constitutional: Negative for appetite change and fever.  HENT: Negative for congestion, mouth sores, rhinorrhea, sore throat and trouble swallowing.   Eyes: Negative for visual disturbance.  Respiratory: Negative for cough, shortness of breath and wheezing.   Cardiovascular: Negative for chest pain and palpitations.  Gastrointestinal: Positive for abdominal distention. Negative for abdominal pain, constipation, diarrhea, nausea and vomiting.  Genitourinary: Negative for dysuria.  Musculoskeletal: Positive for gait problem. Negative for back pain.  Skin: Negative for rash and wound.  Neurological: Negative for dizziness and headaches.  Hematological: Bruises/bleeds easily.  Psychiatric/Behavioral: Negative for behavioral problems and confusion.    Immunization History  Administered Date(s) Administered  . Influenza-Unspecified 07/17/2017   Pertinent  Health Maintenance Due  Topic Date Due  . FOOT EXAM  12/29/1955  . OPHTHALMOLOGY EXAM  12/29/1955  . URINE MICROALBUMIN  [birth year]29/1955  . HEMOGLOBIN A1C  06/22/2017  . COLONOSCOPY  08/03/2019  . INFLUENZA VACCINE  Completed  . PNA vac Low Risk Adult  Completed   Fall Risk  05/19/2017  Falls in the past year? No   Functional Status Survey:    Vitals:   08/20/17 1501  BP: 110/62  Pulse: 83  Resp: 20  Temp: 98.3 F (36.8 C)  TempSrc: Oral  SpO2: 95%  Weight: 184 lb 11.2 oz (83.8 kg)  Height: 6' 0.5" (1.842 m)   Body mass index is 24.71 kg/m.   Wt Readings from Last 3 Encounters:  08/20/17 184 lb 11.2 oz (83.8 kg)  07/23/17 167 lb (75.8 kg)  06/23/17 165 lb (74.8 kg)   Physical Exam  Constitutional: He is oriented to person, place, and time. He appears well-developed.  HENT:  Head: Normocephalic and atraumatic.  Mouth/Throat: Oropharynx is clear and moist.  Eyes: EOM are normal. Pupils are equal, round, and reactive to light. Right eye exhibits no discharge. Left eye  exhibits no discharge.  Neck: Normal range of motion. Neck supple.  Cardiovascular: Normal rate and regular rhythm.  Pulmonary/Chest: Effort normal and breath sounds normal. No respiratory distress. He has no wheezes. He has no rales.  Abdominal: Soft. Bowel sounds are normal. He exhibits distension. There is no tenderness. There is no rebound and no guarding.  Musculoskeletal: He exhibits no edema.  Able to move all 4 extremities, walker for ambulation and minimum person assistance  Lymphadenopathy:    He has no cervical adenopathy.  Neurological: He is alert and oriented to person, place, and time.  Skin: Skin is warm and dry. He is not diaphoretic.  Psychiatric: He has a normal mood and affect.    Labs reviewed: Recent Labs    02/13/17 0507 02/14/17 0519  03/19/17 1556  03/31/17 04/07/17  04/13/17 05/08/17  NA 139 136   < > 136   < > 136* 135* 137  137  137 137  K 4.0 4.1   < > 5.2*   < > 4.6 4.6 4.9  4.9  4.9 5.0  CL 116* 111  --  106  --   --   --   --   --   CO2 20* 18*  --  24  --   --   --   --   --   GLUCOSE 123* 137*  --  232*  --   --   --   --   --   BUN 23* 24*   < > 37*   < > 43* 39* 37*  37*  37*  37* 41*  CREATININE 1.31* 1.33*   < > 1.85*   < > 1.9* 1.7* 1.6*  1.6*  1.6* 1.5*  CALCIUM 8.1* 8.2*  --  8.8  --  8.2  --   --   --    < > = values in this interval not displayed.   Recent Labs    02/12/17 0521 02/13/17 0507 02/14/17 0519  03/27/17 03/31/17 04/07/17  AST 32 35 38   < > 25 21 26   ALT 27 30 33   < > 26 21 27   ALKPHOS 112 111 111   < > 95 89 94  BILITOT 1.4* 1.6* 1.5*  --   --   --   --   PROT 5.2* 5.0* 4.9*  --   --  5.3  --   ALBUMIN 3.0* 2.9* 2.8*  --   --  2.9  --    < > = values in this interval not displayed.   Recent Labs    11/13/16 1704  12/20/16 0137  12/23/16 0539  02/11/17 2005 02/12/17 0521 02/13/17 0507  02/24/17 03/27/17 03/31/17  WBC 8.2   < > 3.8*   < > 2.0*   < > 2.8* 2.2* 2.2*   < > 2.0 2.7 2.0  NEUTROABS 8.0*  --   3.4  --  1.4*  --   --   --   --   --   --   --   --   HGB 10.8*   < > 10.7*   < > 8.8*   < > 11.2* 9.5* 9.9*   < > 9.4* 10.8* 10.4*  HCT 30.7*   < > 30.3*   < > 24.9*   < > 32.7* 27.7* 27.9*   < > 28* 31* 30*  MCV 88.7   < > 88.3   < > 85.0   < > 87.4 86.8 86.9  --   --   --   --   PLT 36*   < > 38*   < > 31*   < > 44* 39* 41*   < > 37* 36* 37*   < > = values in this interval not displayed.   Lab Results  Component Value Date   TSH 2.49 06/12/2017   Lab Results  Component Value Date   HGBA1C 5.2 12/20/2016   No results found for: CHOL, HDL, LDLCALC, LDLDIRECT, TRIG, CHOLHDL  Significant Diagnostic Results in last 30 days:  No results found.  Assessment/Plan  End stage liver disease Has NASH with ascites. Continue rifaxamin 550 mg bid with lasix and aldactone. Continue lactulose as well. Has gained weight but no increased leg edema and lungs are clear on  exam. No change to diuretic dosing given his few soft SBP reading for now. No signs of encephalopathy this visit.   Hypothyroidism Lab Results  Component Value Date   TSH 2.49 06/12/2017   Continue levothyroxine current regimen.   Depression Mood has improved. Attempt GDR for sertraline from 50 mg daily to 25 mg daily and monitor.   Family/ staff Communication: reviewed care plan with patient and charge nurse.    Labs/tests ordered:  none   Blanchie Serve, MD Internal Medicine [facility] [address] [place], [place] [address] Cell Phone (Monday-Friday 8 am - 5 pm): [phone] On Call: [phone] and follow prompts after 5 pm and on weekends Office Phone: [phone] Office Fax: [phone]

## 2017-09-24 ENCOUNTER — Encounter: Payer: Self-pay | Admitting: Family

## 2017-09-24 ENCOUNTER — Non-Acute Institutional Stay (SKILLED_NURSING_FACILITY): Payer: Medicare Other | Admitting: Family

## 2017-09-24 DIAGNOSIS — E039 Hypothyroidism, unspecified: Secondary | ICD-10-CM

## 2017-09-24 DIAGNOSIS — K7460 Unspecified cirrhosis of liver: Secondary | ICD-10-CM | POA: Diagnosis not present

## 2017-09-24 DIAGNOSIS — F339 Major depressive disorder, recurrent, unspecified: Secondary | ICD-10-CM | POA: Diagnosis not present

## 2017-09-24 DIAGNOSIS — K7581 Nonalcoholic steatohepatitis (NASH): Secondary | ICD-10-CM

## 2017-09-24 NOTE — Progress Notes (Signed)
Location:  [facility] Room Number: 35 Place of Service:  SNF (31) Provider: Dinah Ngetich FNP-C   Blanchie Serve, MD  Patient Care Team: Blanchie Serve, MD as PCP - General (Internal Medicine) Ngetich, Nelda Bucks, NP as Nurse Practitioner (Family Medicine)  Extended Emergency Contact Information Primary Emergency Contact: Suszanne Conners Address: [address]          [place],  [address] [place] Phone: [phone] Relation: Spouse  Code Status: DNR  Goals of care: Advanced Directive information Advanced Directives 09/24/2017  Does Patient Have a Medical Advance Directive? Yes  Type of Paramedic of [place];Out of facility DNR (pink MOST or yellow form)  Does patient want to make changes to medical advance directive? -  Copy of [place] in Chart? Yes  Would patient like information on creating a medical advance directive? -  Pre-existing out of facility DNR order (yellow form or pink MOST form) Yellow form placed in chart (order not valid for inpatient use);Pink MOST form placed in chart (order not valid for inpatient use)     Chief Complaint  Patient presents with  . Medical Management of Chronic Issues    routine visit    HPI:  Pt is a 71 y.o. male seen today [facility] for medical management of chronic diseases.he has a medical history of HTN, Liver cirrhosis secondary to NASH,Hypothyroidism,depression,CKD stage 4  Among other conditions.He is seen in his room today with Nurse present. He states prefers to lie in the bed due to worsening abdominal discomfort.Facility Nurse reports new left heel ulcer.He denies any pain,fever,chills or cough. He has had 13.7 pounds weight gain over one month.    Past Medical History:  Diagnosis Date  . Abdominal hernia   . Ascites   . Benign neoplasm of colon   . Cirrhosis ([place] [place])   . Diabetes mellitus, type 2 ([place])    resolved with  gastric bypass  . Diverticulosis of colon (without mention of hemorrhage)   . Esophageal varices ([place])   . Hyperlipemia   . Hypertension   . Iron deficiency anemia secondary to blood loss (chronic)   . Kidney stones   . Liver cyst   . Lymphopenia   . NASH (nonalcoholic steatohepatitis)   . Osteoarthritis of hip   . Renal cyst   . Splenomegaly   . Unspecified sleep apnea    Resolved   Past Surgical History:  Procedure Laterality Date  . ANKLE SURGERY     Bil  . COLONOSCOPY  2012  . GASTRIC BYPASS  2005  . KNEE SURGERY     Bil  . POLYPECTOMY    . TOTAL HIP ARTHROPLASTY  2008   right    Allergies  Allergen Reactions  . Oxycodone Other (See Comments)    Reaction:  Caused pt to fall     Allergies as of 09/24/2017      Reactions   Oxycodone Other (See Comments)   Reaction:  Caused pt to fall       Medication List        Accurate as of 09/24/17  2:33 PM. Always use your most recent med list.          acetaminophen 325 MG tablet Commonly known as:  TYLENOL Take 650 mg by mouth every 4 (four) hours as needed.   BOOST BREEZE PO Take 1 Container by mouth 3 (three) times daily between meals.   diphenoxylate-atropine  2.5-0.025 MG tablet Commonly known as:  LOMOTIL Take 1 tablet by mouth 4 (four) times daily as needed for diarrhea or loose stools.   furosemide 20 MG tablet Commonly known as:  LASIX Take 20 mg by mouth daily.   lactulose 10 GM/15ML solution Commonly known as:  CHRONULAC Take 10 g 3 (three) times daily by mouth. Take 45 mL. Hold for >3 bowel movements per day   levothyroxine 25 MCG tablet Commonly known as:  SYNTHROID, LEVOTHROID Take 25 mcg by mouth daily before breakfast.   multivitamin with minerals tablet Take 1 tablet by mouth daily.   rifaximin 550 MG Tabs tablet Commonly known as:  XIFAXAN Take 550 mg by mouth 2 (two) times daily.   sodium bicarbonate 650 MG tablet Take 650 mg by mouth daily.   spironolactone 50 MG  tablet Commonly known as:  ALDACTONE Take 50 mg by mouth daily.   ZOLOFT PO Take 25 mg by mouth at bedtime.       Review of Systems  Constitutional: Negative for activity change, appetite change, chills, fatigue and fever.  HENT: Negative for congestion, rhinorrhea, sinus pressure, sinus pain, sneezing and sore throat.   Eyes: Negative for discharge, redness and visual disturbance.  Respiratory: Negative for cough, chest tightness, shortness of breath and wheezing.   Cardiovascular: Positive for leg swelling. Negative for chest pain and palpitations.  Gastrointestinal: Positive for abdominal distention. Negative for abdominal pain, constipation, diarrhea, nausea and vomiting.  Endocrine: Negative for cold intolerance, heat intolerance, polydipsia, polyphagia and polyuria.  Genitourinary: Negative for dysuria, flank pain, frequency and urgency.  Musculoskeletal: Positive for gait problem.  Skin: Negative for color change, pallor and rash.       Left heel ulcer recurrence   Neurological: Negative for dizziness, syncope, light-headedness and headaches.  Hematological: Does not bruise/bleed easily.  Psychiatric/Behavioral: Negative for agitation, hallucinations and sleep disturbance. The patient is not nervous/anxious.     Immunization History  Administered Date(s) Administered  . Influenza-Unspecified 07/17/2017   Pertinent  Health Maintenance Due  Topic Date Due  . FOOT EXAM  12/29/1955  . OPHTHALMOLOGY EXAM  12/29/1955  . URINE MICROALBUMIN  [birth year]29/1955  . HEMOGLOBIN A1C  06/22/2017  . COLONOSCOPY  08/03/2019  . INFLUENZA VACCINE  Completed  . PNA vac Low Risk Adult  Completed   Fall Risk  05/19/2017  Falls in the past year? No   Functional Status Survey:    Vitals:   09/24/17 1237  BP: 136/66  Pulse: 79  Resp: 20  Temp: (!) 97.2 F (36.2 C)  SpO2: 97%  Weight: 198 lb 6.4 oz (90 kg)  Height: 6' 0.5" (1.842 m)   Body mass index is 26.54 kg/m. Physical Exam   Constitutional: No distress.  Frail elderly  HENT:  Head: Normocephalic.  Mouth/Throat: Oropharynx is clear and moist. No oropharyngeal exudate.  Eyes: Conjunctivae and EOM are normal. Pupils are equal, round, and reactive to light. Right eye exhibits no discharge. Left eye exhibits no discharge. No scleral icterus.  Neck: Normal range of motion. No JVD present. No thyromegaly present.  Cardiovascular: Normal rate, regular rhythm, normal heart sounds and intact distal pulses. Exam reveals no gallop and no friction rub.  No murmur heard. Pulmonary/Chest: Effort normal and breath sounds normal. No respiratory distress. He has no wheezes. He has no rales.  Abdominal: Soft. Bowel sounds are normal. He exhibits distension. There is no tenderness. There is no rebound and no guarding.  Reducible umbilical hernia   Genitourinary:  Genitourinary Comments: Incontinent   Musculoskeletal: He exhibits no tenderness.  Unsteady gait uses FWW with assist. Bilateral lower extremities 2+ edema.   Lymphadenopathy:    He has no cervical adenopathy.  Neurological: Coordination normal.  Alert and oriented to person,place except time.   Skin: Skin is warm and dry. No rash noted. No erythema.  1. Sacral maceration no signs of infection.  2. Left heel unstageable pressure ulcer; black eschar noted. Surrounding skin tissues without any redness or tenderness.   Psychiatric: He has a normal mood and affect.    Labs reviewed: Recent Labs    02/13/17 0507 02/14/17 0519  03/19/17 1556  03/31/17 04/07/17 04/13/17 05/08/17  NA 139 136   < > 136   < > 136* 135* 137  137  137 137  K 4.0 4.1   < > 5.2*   < > 4.6 4.6 4.9  4.9  4.9 5.0  CL 116* 111  --  106  --   --   --   --   --   CO2 20* 18*  --  24  --   --   --   --   --   GLUCOSE 123* 137*  --  232*  --   --   --   --   --   BUN 23* 24*   < > 37*   < > 43* 39* 37*  37*  37*  37* 41*  CREATININE 1.31* 1.33*   < > 1.85*   < > 1.9* 1.7* 1.6*  1.6*   1.6* 1.5*  CALCIUM 8.1* 8.2*  --  8.8  --  8.2  --   --   --    < > = values in this interval not displayed.   Recent Labs    02/12/17 0521 02/13/17 0507 02/14/17 0519  03/27/17 03/31/17 04/07/17  AST 32 35 38   < > 25 21 26   ALT 27 30 33   < > 26 21 27   ALKPHOS 112 111 111   < > 95 89 94  BILITOT 1.4* 1.6* 1.5*  --   --   --   --   PROT 5.2* 5.0* 4.9*  --   --  5.3  --   ALBUMIN 3.0* 2.9* 2.8*  --   --  2.9  --    < > = values in this interval not displayed.   Recent Labs    11/13/16 1704  12/20/16 0137  12/23/16 0539  02/11/17 2005 02/12/17 0521 02/13/17 0507  02/24/17 03/27/17 03/31/17  WBC 8.2   < > 3.8*   < > 2.0*   < > 2.8* 2.2* 2.2*   < > 2.0 2.7 2.0  NEUTROABS 8.0*  --  3.4  --  1.4*  --   --   --   --   --   --   --   --   HGB 10.8*   < > 10.7*   < > 8.8*   < > 11.2* 9.5* 9.9*   < > 9.4* 10.8* 10.4*  HCT 30.7*   < > 30.3*   < > 24.9*   < > 32.7* 27.7* 27.9*   < > 28* 31* 30*  MCV 88.7   < > 88.3   < > 85.0   < > 87.4 86.8 86.9  --   --   --   --   PLT 36*   < > 38*   < >  31*   < > 44* 39* 41*   < > 37* 36* 37*   < > = values in this interval not displayed.   Lab Results  Component Value Date   TSH 2.49 06/12/2017   Lab Results  Component Value Date   HGBA1C 5.2 12/20/2016    Significant Diagnostic Results in last 30 days:  No results found.  Assessment/Plan 1. Liver cirrhosis secondary to NASH  Has had worsening abdominal distension and 13.7 lbs weight gain over one month.worsening discomfort.change furosemide to 40 mg tablet daily.continue on spironolactone 50 mg tablet daily and rifaximin 550 mg tablet twice daily.continue to monitor weight.Refer for ultrasound guided paracentesis for symptoms relief and comfort. Continue on hospice.Recheck BMP  10/02/2017       2. Acquired hypothyroidism Lab Results  Component Value Date   TSH 2.49 06/12/2017  Continue on levothyroxine 25 mcg tablet daily. Monitor TSH level.   3. Depression Stable. Continue on  sertraline 25 mg tablet daily.continue to monitor for mood changes.   Family/ staff Communication: Reviewed plan of care with patient and facility Nurse.   Labs/tests ordered: BMP  10/02/2017   Nelda Bucks Ngetich, NP

## 2017-09-26 ENCOUNTER — Other Ambulatory Visit: Payer: Self-pay | Admitting: Internal Medicine

## 2017-09-26 DIAGNOSIS — K7581 Nonalcoholic steatohepatitis (NASH): Secondary | ICD-10-CM

## 2017-09-26 DIAGNOSIS — R609 Edema, unspecified: Secondary | ICD-10-CM

## 2017-09-29 ENCOUNTER — Ambulatory Visit (HOSPITAL_COMMUNITY)
Admission: RE | Admit: 2017-09-29 | Discharge: 2017-09-29 | Disposition: A | Discharge status: Home / Self Care | Source: Ambulatory Visit | Attending: Internal Medicine | Admitting: Internal Medicine

## 2017-09-29 ENCOUNTER — Encounter (HOSPITAL_COMMUNITY): Payer: Self-pay | Admitting: Student

## 2017-09-29 DIAGNOSIS — K7581 Nonalcoholic steatohepatitis (NASH): Secondary | ICD-10-CM | POA: Diagnosis not present

## 2017-09-29 DIAGNOSIS — R188 Other ascites: Secondary | ICD-10-CM | POA: Insufficient documentation

## 2017-09-29 DIAGNOSIS — R609 Edema, unspecified: Secondary | ICD-10-CM

## 2017-09-29 HISTORY — PX: IR PARACENTESIS: IMG2679

## 2017-09-29 MED ORDER — LIDOCAINE HCL (PF) 1 % IJ SOLN
INTRAMUSCULAR | Status: DC | PRN
  Administered 2017-09-29: 10 mL

## 2017-09-29 MED ORDER — LIDOCAINE HCL (PF) 2 % IJ SOLN
INTRAMUSCULAR | Status: AC
  Filled 2017-09-29: qty 10

## 2017-09-29 NOTE — Procedures (Signed)
PROCEDURE SUMMARY:  Successful US guided paracentesis from right lateral abdomen.  Yielded 10.0 liters of clear, yellow fluid.  No immediate complications.  Pt tolerated well.   Specimen was not sent for labs.  Docia Barrier PA-C 09/29/2017 12:24 PM

## 2017-10-02 DIAGNOSIS — I10 Essential (primary) hypertension: Secondary | ICD-10-CM | POA: Diagnosis not present

## 2017-10-21 ENCOUNTER — Non-Acute Institutional Stay (SKILLED_NURSING_FACILITY): Payer: Medicare Other | Admitting: Family

## 2017-10-21 ENCOUNTER — Encounter: Payer: Self-pay | Admitting: Family

## 2017-10-21 DIAGNOSIS — R234 Changes in skin texture: Secondary | ICD-10-CM

## 2017-10-27 ENCOUNTER — Encounter: Payer: Self-pay | Admitting: Internal Medicine

## 2017-10-27 ENCOUNTER — Non-Acute Institutional Stay (SKILLED_NURSING_FACILITY): Payer: Medicare Other | Admitting: Internal Medicine

## 2017-10-27 DIAGNOSIS — F329 Major depressive disorder, single episode, unspecified: Secondary | ICD-10-CM

## 2017-10-27 DIAGNOSIS — K7460 Unspecified cirrhosis of liver: Secondary | ICD-10-CM | POA: Diagnosis not present

## 2017-10-27 DIAGNOSIS — N184 Chronic kidney disease, stage 4 (severe): Secondary | ICD-10-CM | POA: Diagnosis not present

## 2017-10-27 DIAGNOSIS — K7581 Nonalcoholic steatohepatitis (NASH): Secondary | ICD-10-CM | POA: Diagnosis not present

## 2017-10-27 DIAGNOSIS — D696 Thrombocytopenia, unspecified: Secondary | ICD-10-CM | POA: Diagnosis not present

## 2017-10-27 DIAGNOSIS — F32A Depression, unspecified: Secondary | ICD-10-CM | POA: Insufficient documentation

## 2017-10-27 DIAGNOSIS — K439 Ventral hernia without obstruction or gangrene: Secondary | ICD-10-CM | POA: Diagnosis not present

## 2017-10-27 DIAGNOSIS — E440 Moderate protein-calorie malnutrition: Secondary | ICD-10-CM | POA: Insufficient documentation

## 2017-10-27 NOTE — Progress Notes (Signed)
Location:  [facility] Room Number: 35 Place of Service:  SNF [phone]) Provider:  Blanchie Serve MD  Blanchie Serve, MD  Patient Care Team: Blanchie Serve, MD as PCP - General (Internal Medicine) Ngetich, Nelda Bucks, NP as Nurse Practitioner (Family Medicine)  Extended Emergency Contact Information Primary Emergency Contact: Suszanne Conners Address: [address]          [place], [place] [address] [place] Phone: [phone] Relation: Spouse  Code Status:  DNR  Goals of care: Advanced Directive information Advanced Directives 10/27/2017  Does Patient Have a Medical Advance Directive? Yes  Type of Paramedic of [place];Out of facility DNR (pink MOST or yellow form)  Does patient want to make changes to medical advance directive? No - Patient declined  Copy of [place] in Chart? Yes  Would patient like information on creating a medical advance directive? -  Pre-existing out of facility DNR order (yellow form or pink MOST form) Yellow form placed in chart (order not valid for inpatient use);Pink MOST form placed in chart (order not valid for inpatient use)     Chief Complaint  Patient presents with  . Medical Management of Chronic Issues    Routine Visit     HPI:  Pt is a 72 y.o. male seen today for medical management of chronic diseases. He underwent therapeutic paracentesis on 09/29/17 and had 10 liters of fluid removed. His mood appears improved. No fall reported. He has an eschar to left heel. He feeds himself. He is out of bed daily. He moves his bowel 2-3 times a day. No acute concern from patient or nursing. His daughter present today. Stable BP reading on chart review.    Past Medical History:  Diagnosis Date  . Abdominal hernia   . Ascites   . Benign neoplasm of colon   . Cirrhosis ([facility])   . Diabetes mellitus, type 2 ([place])    resolved with gastric bypass  . Diverticulosis of colon  (without mention of hemorrhage)   . Esophageal varices ([place])   . Hyperlipemia   . Hypertension   . Iron deficiency anemia secondary to blood loss (chronic)   . Kidney stones   . Liver cyst   . Lymphopenia   . NASH (nonalcoholic steatohepatitis)   . Osteoarthritis of hip   . Renal cyst   . Splenomegaly   . Unspecified sleep apnea    Resolved   Past Surgical History:  Procedure Laterality Date  . ANKLE SURGERY     Bil  . COLONOSCOPY  2012  . GASTRIC BYPASS  2005  . IR PARACENTESIS  09/29/2017  . KNEE SURGERY     Bil  . POLYPECTOMY    . TOTAL HIP ARTHROPLASTY  2008   right    Allergies  Allergen Reactions  . Oxycodone Other (See Comments)    Reaction:  Caused pt to fall     Outpatient Encounter Medications as of 10/27/2017  Medication Sig  . acetaminophen (TYLENOL) 325 MG tablet Take 650 mg by mouth every 4 (four) hours as needed.  . calcium carbonate (OSCAL) 1500 (600 Ca) MG TABS tablet Take 600 mg of elemental calcium by mouth daily.  . diphenoxylate-atropine (LOMOTIL) 2.5-0.025 MG tablet Take 1 tablet by mouth 4 (four) times daily as needed for diarrhea or loose stools.  . furosemide (LASIX) 40 MG tablet Take 40 mg by mouth daily.  Marland Kitchen lactulose (CHRONULAC) 10 GM/15ML solution Take 10  g 3 (three) times daily by mouth. Take 45 mL. Hold for >3 bowel movements per day  . levothyroxine (SYNTHROID, LEVOTHROID) 25 MCG tablet Take 25 mcg by mouth daily before breakfast.  . Multiple Vitamins-Minerals (MULTIVITAMIN WITH MINERALS) tablet Take 1 tablet by mouth daily.  . Nutritional Supplements (BOOST BREEZE PO) Take 1 Container by mouth 3 (three) times daily between meals.   . rifaximin (XIFAXAN) 550 MG TABS tablet Take 550 mg by mouth 2 (two) times daily.  . Sertraline HCl (ZOLOFT PO) Take 25 mg by mouth at bedtime.   . sodium bicarbonate 650 MG tablet Take 650 mg by mouth daily.   Marland Kitchen spironolactone (ALDACTONE) 50 MG tablet Take 50 mg by mouth daily.  Marland Kitchen zinc oxide (BALMEX) 11.3 %  CREA cream Apply 1 application topically 3 (three) times daily as needed.   No facility-administered encounter medications on file as of 10/27/2017.     Review of Systems  Constitutional: Negative for appetite change, chills, fatigue and fever.  HENT: Negative for congestion, hearing loss, mouth sores, rhinorrhea and trouble swallowing.   Respiratory: Negative for cough, choking, shortness of breath and wheezing.   Cardiovascular: Negative for chest pain and palpitations.  Gastrointestinal: Negative for abdominal pain, constipation, diarrhea, nausea and vomiting.  Genitourinary: Negative for dysuria.       Has Urinary incontinence  Musculoskeletal: Positive for gait problem. Negative for back pain.  Neurological: Positive for dizziness. Negative for headaches.       With sudden change of position has some dizziness  Psychiatric/Behavioral: Negative for behavioral problems and confusion.    Immunization History  Administered Date(s) Administered  . Influenza-Unspecified 07/17/2017   Pertinent  Health Maintenance Due  Topic Date Due  . FOOT EXAM  12/29/1955  . OPHTHALMOLOGY EXAM  12/29/1955  . URINE MICROALBUMIN  12/29/1955  . HEMOGLOBIN A1C  06/22/2017  . COLONOSCOPY  08/03/2019  . INFLUENZA VACCINE  Completed  . PNA vac Low Risk Adult  Completed   Fall Risk  05/19/2017  Falls in the past year? No   Functional Status Survey:    Vitals:   10/27/17 1113  BP: (!) 142/84  Pulse: 76  Resp: 18  Temp: 98.1 F (36.7 C)  TempSrc: Oral  SpO2: 98%  Weight: 196 lb 14.4 oz (89.3 kg)  Height: 6' 0.5" (1.842 m)   Body mass index is 26.34 kg/m.   Wt Readings from Last 3 Encounters:  10/27/17 196 lb 14.4 oz (89.3 kg)  10/21/17 196 lb 14.4 oz (89.3 kg)  09/24/17 198 lb 6.4 oz (90 kg)   Physical Exam  Constitutional: He is oriented to person, place, and time. He appears well-developed. No distress.  Frail, elderly male  HENT:  Head: Normocephalic and atraumatic.    Mouth/Throat: Oropharynx is clear and moist.  Eyes: Conjunctivae and EOM are normal. Pupils are equal, round, and reactive to light. Right eye exhibits no discharge. Left eye exhibits no discharge.  Neck: Normal range of motion. Neck supple.  Cardiovascular: Normal rate, regular rhythm and intact distal pulses.  Pulmonary/Chest: Effort normal and breath sounds normal. He has no wheezes. He has no rales.  Abdominal: Soft. Bowel sounds are normal. He exhibits distension. There is no tenderness. There is no guarding.  Umbilical hernia, ascites +  Musculoskeletal: He exhibits edema and deformity.  Can move all 4 extremities, unsteady gait, uses walker and wheelchair for ambulation  Lymphadenopathy:    He has no cervical adenopathy.  Neurological: He is alert and oriented  to person, place, and time.  Skin: Skin is warm and dry. No rash noted. He is not diaphoretic.  Psychiatric: He has a normal mood and affect.    Labs reviewed: Recent Labs    02/13/17 0507 02/14/17 0519  03/19/17 1556  03/31/17 04/07/17 04/13/17 05/08/17  NA 139 136   < > 136   < > 136* 135* 137  137  137 137  K 4.0 4.1   < > 5.2*   < > 4.6 4.6 4.9  4.9  4.9 5.0  CL 116* 111  --  106  --   --   --   --   --   CO2 20* 18*  --  24  --   --   --   --   --   GLUCOSE 123* 137*  --  232*  --   --   --   --   --   BUN 23* 24*   < > 37*   < > 43* 39* 37*  37*  37*  37* 41*  CREATININE 1.31* 1.33*   < > 1.85*   < > 1.9* 1.7* 1.6*  1.6*  1.6* 1.5*  CALCIUM 8.1* 8.2*  --  8.8  --  8.2  --   --   --    < > = values in this interval not displayed.   Recent Labs    02/12/17 0521 02/13/17 0507 02/14/17 0519  03/27/17 03/31/17 04/07/17  AST 32 35 38   < > 25 21 26   ALT 27 30 33   < > 26 21 27   ALKPHOS 112 111 111   < > 95 89 94  BILITOT 1.4* 1.6* 1.5*  --   --   --   --   PROT 5.2* 5.0* 4.9*  --   --  5.3  --   ALBUMIN 3.0* 2.9* 2.8*  --   --  2.9  --    < > = values in this interval not displayed.   Recent Labs     11/13/16 1704  12/20/16 0137  12/23/16 0539  02/11/17 2005 02/12/17 0521 02/13/17 0507  02/24/17 03/27/17 03/31/17  WBC 8.2   < > 3.8*   < > 2.0*   < > 2.8* 2.2* 2.2*   < > 2.0 2.7 2.0  NEUTROABS 8.0*  --  3.4  --  1.4*  --   --   --   --   --   --   --   --   HGB 10.8*   < > 10.7*   < > 8.8*   < > 11.2* 9.5* 9.9*   < > 9.4* 10.8* 10.4*  HCT 30.7*   < > 30.3*   < > 24.9*   < > 32.7* 27.7* 27.9*   < > 28* 31* 30*  MCV 88.7   < > 88.3   < > 85.0   < > 87.4 86.8 86.9  --   --   --   --   PLT 36*   < > 38*   < > 31*   < > 44* 39* 41*   < > 37* 36* 37*   < > = values in this interval not displayed.   Lab Results  Component Value Date   TSH 2.49 06/12/2017   Lab Results  Component Value Date   HGBA1C 5.2 12/20/2016   No results found for: CHOL, HDL, LDLCALC, LDLDIRECT, TRIG, CHOLHDL  Significant Diagnostic Results in last 30 days:  Ir Paracentesis  Result Date: 09/29/2017 INDICATION: Patient with recurrent ascites. Request is made for therapeutic paracentesis. EXAM: ULTRASOUND GUIDED THERAPEUTIC PARACENTESIS MEDICATIONS: 10 mL 2% lidocaine COMPLICATIONS: None immediate. PROCEDURE: Informed written consent was obtained from the patient after a discussion of the risks, benefits and alternatives to treatment. A timeout was performed prior to the initiation of the procedure. Initial ultrasound scanning demonstrates a large amount of ascites within the right lateral abdomen. The right lateral abdomen was prepped and draped in the usual sterile fashion. 2% lidocaine was used for local anesthesia. Following this, a 19 gauge, 7-cm, Yueh catheter was introduced. An ultrasound image was saved for documentation purposes. The paracentesis was performed. The catheter was removed and a dressing was applied. The patient tolerated the procedure well without immediate post procedural complication. FINDINGS: A total of approximately 10.0 liters of clear, yellow fluid was removed. IMPRESSION: Successful  ultrasound-guided therapeutic paracentesis yielding 10.0 liters of peritoneal fluid. Read by:  Brynda Greathouse PA-C Electronically Signed   By: Markus Daft M.D.   On: 09/29/2017 12:26    Assessment/Plan  Liver cirrhosis with NASH With ascites. Anasarca has greatly improved. Has history of portal hypertension with non bleeding variceal bleeding in past. Recent therapeutic paracentesis with removal of 10 liters of fluid. Continue spironolactone 50 mg daily with lasix 40 mg daily to help with ascites and edema. No hepatic encephalopathy this visit. Continue rifaxamin and lactulose. Check bmp.   Ventral hernia Stable, monitor.   Thrombocytopenia With NASH with ascites with liver failure. No bleed reported. Monitor platelet count periodically. Check cbc with his anemia  Depression Stable and improved mood. Continue sertraline 25 mg daily for now  ckd stage 3 Check his renal function  Family/ staff Communication: reviewed care plan with patient and charge nurse.    Labs/tests ordered:    Blanchie Serve, MD Internal Medicine [place], [place] [address] Cell Phone (Monday-Friday 8 am - 5 pm): [phone] On Call: [phone] and follow prompts after 5 pm and on weekends Office Phone: [phone] Office Fax: [phone]

## 2017-10-29 NOTE — Progress Notes (Signed)
Location:  [place] Room Number: 35 Place of Service:  SNF (31) Provider: Dinah Ngetich FNP-C  Blanchie Serve, MD  Patient Care Team: Blanchie Serve, MD as PCP - General (Internal Medicine) Ngetich, Nelda Bucks, NP as Nurse Practitioner (Family Medicine)  Extended Emergency Contact Information Primary Emergency Contact: Suszanne Conners Address: [address]          [place], [place] [address] [place] Phone: [phone] Relation: Spouse  Code Status:  DNR Goals of care: Advanced Directive information Advanced Directives 10/27/2017  Does Patient Have a Medical Advance Directive? Yes  Type of Paramedic of [place];Out of facility DNR (pink MOST or yellow form)  Does patient want to make changes to medical advance directive? No - Patient declined  Copy of [place] in Chart? Yes  Would patient like information on creating a medical advance directive? -  Pre-existing out of facility DNR order (yellow form or pink MOST form) Yellow form placed in chart (order not valid for inpatient use);Pink MOST form placed in chart (order not valid for inpatient use)     Chief Complaint  Patient presents with  . Acute Visit    evaluate left heel ulcer    HPI:  Pt is a 72 y.o. male seen today at [facility] for an acute visit for evaluation of left heel ulcer.He is seen in his room today with facility Nurse present at bedside.Nurse reports patient's previous left heel blister has turned black in color.He denies any fever,chills,pain or drainage from heel.   Past Medical History:  Diagnosis Date  . Abdominal hernia   . Ascites   . Benign neoplasm of colon   . Cirrhosis ([place])   . Diabetes mellitus, type 2 ([place])    resolved with gastric bypass  . Diverticulosis of colon (without mention of hemorrhage)   . Esophageal varices ([place])   . Hyperlipemia   . Hypertension   . Iron deficiency anemia  secondary to blood loss (chronic)   . Kidney stones   . Liver cyst   . Lymphopenia   . NASH (nonalcoholic steatohepatitis)   . Osteoarthritis of hip   . Renal cyst   . Splenomegaly   . Unspecified sleep apnea    Resolved   Past Surgical History:  Procedure Laterality Date  . ANKLE SURGERY     Bil  . COLONOSCOPY  2012  . GASTRIC BYPASS  2005  . IR PARACENTESIS  09/29/2017  . KNEE SURGERY     Bil  . POLYPECTOMY    . TOTAL HIP ARTHROPLASTY  2008   right    Allergies  Allergen Reactions  . Oxycodone Other (See Comments)    Reaction:  Caused pt to fall     Outpatient Encounter Medications as of 10/21/2017  Medication Sig  . acetaminophen (TYLENOL) 325 MG tablet Take 650 mg by mouth every 4 (four) hours as needed.  . diphenoxylate-atropine (LOMOTIL) 2.5-0.025 MG tablet Take 1 tablet by mouth 4 (four) times daily as needed for diarrhea or loose stools.  . furosemide (LASIX) 40 MG tablet Take 40 mg by mouth daily.  Marland Kitchen lactulose (CHRONULAC) 10 GM/15ML solution Take 10 g 3 (three) times daily by mouth. Take 45 mL. Hold for >3 bowel movements per day  . levothyroxine (SYNTHROID, LEVOTHROID) 25 MCG tablet Take 25 mcg by mouth daily before breakfast.  . Multiple Vitamins-Minerals (MULTIVITAMIN WITH MINERALS) tablet Take 1 tablet by mouth daily.  Marland Kitchen  Nutritional Supplements (BOOST BREEZE PO) Take 1 Container by mouth 3 (three) times daily between meals.   . rifaximin (XIFAXAN) 550 MG TABS tablet Take 550 mg by mouth 2 (two) times daily.  . Sertraline HCl (ZOLOFT PO) Take 25 mg by mouth at bedtime.   . sodium bicarbonate 650 MG tablet Take 650 mg by mouth daily.   Marland Kitchen spironolactone (ALDACTONE) 50 MG tablet Take 50 mg by mouth daily.   No facility-administered encounter medications on file as of 10/21/2017.     Review of Systems  Constitutional: Negative for appetite change, chills, fatigue and fever.  Respiratory: Negative for cough, chest tightness, shortness of breath and wheezing.     Cardiovascular: Negative for chest pain, palpitations and leg swelling.  Gastrointestinal: Positive for abdominal distention. Negative for abdominal pain, constipation, diarrhea, nausea and vomiting.  Musculoskeletal: Positive for gait problem.  Skin: Negative for color change, pallor and rash.       Left heel ulcer  Psychiatric/Behavioral: Negative for agitation, confusion and sleep disturbance. The patient is not nervous/anxious.     Immunization History  Administered Date(s) Administered  . Influenza-Unspecified 07/17/2017   Pertinent  Health Maintenance Due  Topic Date Due  . FOOT EXAM  12/29/1955  . OPHTHALMOLOGY EXAM  [birth year]29/1955  . URINE MICROALBUMIN  [birth year]29/1955  . HEMOGLOBIN A1C  06/22/2017  . COLONOSCOPY  08/03/2019  . INFLUENZA VACCINE  Completed  . PNA vac Low Risk Adult  Completed   Fall Risk  05/19/2017  Falls in the past year? No    Vitals:   10/21/17 1407  BP: (!) 116/58  Pulse: 72  Resp: 20  Temp: 97.8 F (36.6 C)  SpO2: 95%  Weight: 196 lb 14.4 oz (89.3 kg)  Height: 6' 0.5" (1.842 m)   Body mass index is 26.34 kg/m. Physical Exam  Constitutional: He is oriented to person, place, and time. He appears well-developed.  Frail elderly in no distress  HENT:  Head: Normocephalic and atraumatic.  Mouth/Throat: Oropharynx is clear and moist. No oropharyngeal exudate.  Eyes: Conjunctivae and EOM are normal. Pupils are equal, round, and reactive to light. Right eye exhibits no discharge. Left eye exhibits no discharge. No scleral icterus.  Cardiovascular: Normal rate, regular rhythm, normal heart sounds and intact distal pulses. Exam reveals no gallop and no friction rub.  No murmur heard. Pulmonary/Chest: Effort normal and breath sounds normal. No respiratory distress. He has no wheezes. He has no rales.  Abdominal: Soft. Bowel sounds are normal. He exhibits distension. There is no tenderness. There is no rebound and no guarding.  Musculoskeletal: He exhibits  no edema or tenderness.  Unsteady gait uses FWW  Neurological: He is oriented to person, place, and time.  Skin: Skin is warm and dry. No rash noted. He is not diaphoretic. No erythema. No pallor.  Left heel previous blister area now dark black eschar.No tenderness or drainage noted.   Psychiatric: He has a normal mood and affect.    Labs reviewed: Recent Labs    02/13/17 0507 02/14/17 0519  03/19/17 1556  03/31/17 04/07/17 04/13/17 05/08/17  NA 139 136   < > 136   < > 136* 135* 137  137  137 137  K 4.0 4.1   < > 5.2*   < > 4.6 4.6 4.9  4.9  4.9 5.0  CL 116* 111  --  106  --   --   --   --   --   CO2 20* 18*  --  24  --   --   --   --   --   GLUCOSE 123* 137*  --  232*  --   --   --   --   --   BUN 23* 24*   < > 37*   < > 43* 39* 37*  37*  37*  37* 41*  CREATININE 1.31* 1.33*   < > 1.85*   < > 1.9* 1.7* 1.6*  1.6*  1.6* 1.5*  CALCIUM 8.1* 8.2*  --  8.8  --  8.2  --   --   --    < > = values in this interval not displayed.   Recent Labs    02/12/17 0521 02/13/17 0507 02/14/17 0519  03/27/17 03/31/17 04/07/17  AST 32 35 38   < > 25 21 26   ALT 27 30 33   < > 26 21 27   ALKPHOS 112 111 111   < > 95 89 94  BILITOT 1.4* 1.6* 1.5*  --   --   --   --   PROT 5.2* 5.0* 4.9*  --   --  5.3  --   ALBUMIN 3.0* 2.9* 2.8*  --   --  2.9  --    < > = values in this interval not displayed.   Recent Labs    11/13/16 1704  12/20/16 0137  12/23/16 0539  02/11/17 2005 02/12/17 0521 02/13/17 0507  02/24/17 03/27/17 03/31/17  WBC 8.2   < > 3.8*   < > 2.0*   < > 2.8* 2.2* 2.2*   < > 2.0 2.7 2.0  NEUTROABS 8.0*  --  3.4  --  1.4*  --   --   --   --   --   --   --   --   HGB 10.8*   < > 10.7*   < > 8.8*   < > 11.2* 9.5* 9.9*   < > 9.4* 10.8* 10.4*  HCT 30.7*   < > 30.3*   < > 24.9*   < > 32.7* 27.7* 27.9*   < > 28* 31* 30*  MCV 88.7   < > 88.3   < > 85.0   < > 87.4 86.8 86.9  --   --   --   --   PLT 36*   < > 38*   < > 31*   < > 44* 39* 41*   < > 37* 36* 37*   < > = values in this  interval not displayed.   Lab Results  Component Value Date   TSH 2.49 06/12/2017   Lab Results  Component Value Date   HGBA1C 5.2 12/20/2016    Significant Diagnostic Results in last 30 days:  Ir Paracentesis  Result Date: 09/29/2017 INDICATION: Patient with recurrent ascites. Request is made for therapeutic paracentesis. EXAM: ULTRASOUND GUIDED THERAPEUTIC PARACENTESIS MEDICATIONS: 10 mL 2% lidocaine COMPLICATIONS: None immediate. PROCEDURE: Informed written consent was obtained from the patient after a discussion of the risks, benefits and alternatives to treatment. A timeout was performed prior to the initiation of the procedure. Initial ultrasound scanning demonstrates a large amount of ascites within the right lateral abdomen. The right lateral abdomen was prepped and draped in the usual sterile fashion. 2% lidocaine was used for local anesthesia. Following this, a 19 gauge, 7-cm, Yueh catheter was introduced. An ultrasound image was saved for documentation purposes. The paracentesis was performed. The catheter was removed and a dressing  was applied. The patient tolerated the procedure well without immediate post procedural complication. FINDINGS: A total of approximately 10.0 liters of clear, yellow fluid was removed. IMPRESSION: Successful ultrasound-guided therapeutic paracentesis yielding 10.0 liters of peritoneal fluid. Read by:  Brynda Greathouse PA-C Electronically Signed   By: Markus Daft M.D.   On: 09/29/2017 12:26   Assessment/Plan  Eschar of heel Afebrile.eschar left heel without any tenderness or drainage.continue current wound care.continue to offload with offloading boots.Monitor for signs of infections or worsening signs/symptoms.  Family/ staff Communication: Reviewed plan of care with patient and facility Nurse.  Labs/tests ordered: None   Dinah C Ngetich, NP

## 2017-10-30 ENCOUNTER — Encounter: Payer: Self-pay | Admitting: *Deleted

## 2017-10-30 LAB — BASIC METABOLIC PANEL
Creatinine: 1.1 (ref 0.6–1.3)
GLUCOSE: 151
Potassium: 3.9 (ref 3.4–5.3)
SODIUM: 134 — AB (ref 137–147)

## 2017-10-30 LAB — HEPATIC FUNCTION PANEL
ALT: 36 (ref 10–40)
AST: 40 (ref 14–40)
Alkaline Phosphatase: 143 — AB (ref 25–125)
BILIRUBIN, TOTAL: 0.8

## 2017-10-30 LAB — CBC AND DIFFERENTIAL
HEMATOCRIT: 28 — AB (ref 41–53)
HEMOGLOBIN: 9.6 — AB (ref 13.5–17.5)
Platelets: 66 — AB (ref 150–399)
WBC: 3.3

## 2017-10-30 LAB — CBC
HCT: 28
HGB: 9.6
WBC: 3.3
platelet count: 66

## 2017-10-30 LAB — COMPLETE METABOLIC PANEL WITH GFR
ALBUMIN: 2.4
ALK PHOS: 143
ALT: 36
AST: 40
BILIRUBIN TOTAL: 0.8
BUN: 30 — AB (ref 4–21)
CALCIUM: 8
Creat: 1.05
GLUCOSE: 151
POTASSIUM: 3.9
Sodium: 134
Total Protein: 5.2 g/dL

## 2017-11-17 ENCOUNTER — Telehealth: Payer: Self-pay | Admitting: *Deleted

## 2017-11-17 NOTE — Telephone Encounter (Signed)
PA required for xifaxan 550mg  1 BID. Completed and denied via Cover My Meds on 11/14/17. Appealed on 11/17/17. Awaiting determination.

## 2017-11-24 ENCOUNTER — Non-Acute Institutional Stay (SKILLED_NURSING_FACILITY): Payer: Medicare Other | Admitting: Family

## 2017-11-24 ENCOUNTER — Encounter: Payer: Self-pay | Admitting: Family

## 2017-11-24 DIAGNOSIS — E039 Hypothyroidism, unspecified: Secondary | ICD-10-CM | POA: Diagnosis not present

## 2017-11-24 DIAGNOSIS — K7460 Unspecified cirrhosis of liver: Secondary | ICD-10-CM | POA: Diagnosis not present

## 2017-11-24 DIAGNOSIS — K7581 Nonalcoholic steatohepatitis (NASH): Secondary | ICD-10-CM | POA: Diagnosis not present

## 2017-11-24 DIAGNOSIS — F329 Major depressive disorder, single episode, unspecified: Secondary | ICD-10-CM | POA: Diagnosis not present

## 2017-11-24 DIAGNOSIS — F32A Depression, unspecified: Secondary | ICD-10-CM

## 2017-11-25 NOTE — Telephone Encounter (Signed)
Staff at [facility] notified.

## 2017-11-25 NOTE — Telephone Encounter (Signed)
Received letter in mail from [facility] [phone] and [place]. Valid 11/13/17-10/06/18 Authorization #: [id number] Case #:[id number] Member ID: [id number]

## 2017-12-07 NOTE — Progress Notes (Signed)
Location:  [place] Room Number: 35 Place of Service:  SNF (31) Provider: Dinah Ngetich FNP-C   Blanchie Serve, MD  Patient Care Team: Blanchie Serve, MD as PCP - General (Internal Medicine) Ngetich, Nelda Bucks, NP as Nurse Practitioner (Family Medicine)  Extended Emergency Contact Information Primary Emergency Contact: Suszanne Conners Address: [address]          [place], [place] [address] [place] Phone: [phone] Relation: Spouse  Code Status:  DNR Goals of care: Advanced Directive information Advanced Directives 11/24/2017  Does Patient Have a Medical Advance Directive? Yes  Type of Paramedic of [place];Out of facility DNR (pink MOST or yellow form)  Does patient want to make changes to medical advance directive? -  Copy of [place] in Chart? Yes  Would patient like information on creating a medical advance directive? -  Pre-existing out of facility DNR order (yellow form or pink MOST form) Yellow form placed in chart (order not valid for inpatient use);Pink MOST form placed in chart (order not valid for inpatient use)     Chief Complaint  Patient presents with  . Medical Management of Chronic Issues    routine visit    HPI:  Pt is a 72 y.o. male seen today [place] for medical management of chronic diseases.He has a history of HTN,CAD,Liver cirrhosis secondary to NASH,hypothyroidism,depression,CKD stage 4 among other conditions.He had paracentesis 09/29/2017 10 liters of fluid removed for comfort.He denies any acute issues this visit.No recent fall episodes or weight changes.He continues to get OOB daily with assistance.Hospice service continue to follow up.Facility Nurse reports no new concerns.  Past Medical History:  Diagnosis Date  . Abdominal hernia   . Ascites   . Benign neoplasm of colon   . Cirrhosis ([place])   . Diabetes mellitus, type 2 ([place])    resolved with  gastric bypass  . Diverticulosis of colon (without mention of hemorrhage)   . Esophageal varices ([place])   . Hyperlipemia   . Hypertension   . Iron deficiency anemia secondary to blood loss (chronic)   . Kidney stones   . Liver cyst   . Lymphopenia   . NASH (nonalcoholic steatohepatitis)   . Osteoarthritis of hip   . Renal cyst   . Splenomegaly   . Unspecified sleep apnea    Resolved   Past Surgical History:  Procedure Laterality Date  . ANKLE SURGERY     Bil  . COLONOSCOPY  2012  . GASTRIC BYPASS  2005  . IR PARACENTESIS  09/29/2017  . KNEE SURGERY     Bil  . POLYPECTOMY    . TOTAL HIP ARTHROPLASTY  2008   right    Allergies  Allergen Reactions  . Oxycodone Other (See Comments)    Reaction:  Caused pt to fall     Allergies as of 11/24/2017      Reactions   Oxycodone Other (See Comments)   Reaction:  Caused pt to fall       Medication List        Accurate as of 11/24/17 11:59 PM. Always use your most recent med list.          acetaminophen 325 MG tablet Commonly known as:  TYLENOL Take 650 mg by mouth every 4 (four) hours as needed.   BOOST BREEZE PO Take 1 Container by mouth 3 (three) times daily between meals.   calcium carbonate 1500 (600 Ca)  MG Tabs tablet Commonly known as:  OSCAL Take 600 mg of elemental calcium by mouth daily.   diphenoxylate-atropine 2.5-0.025 MG tablet Commonly known as:  LOMOTIL Take 1 tablet by mouth 4 (four) times daily as needed for diarrhea or loose stools.   furosemide 40 MG tablet Commonly known as:  LASIX Take 40 mg by mouth daily.   lactulose 10 GM/15ML solution Commonly known as:  CHRONULAC Take 10 g 3 (three) times daily by mouth. Take 45 mL. Hold for >3 bowel movements per day   levothyroxine 25 MCG tablet Commonly known as:  SYNTHROID, LEVOTHROID Take 25 mcg by mouth daily before breakfast.   multivitamin with minerals tablet Take 1 tablet by mouth daily.   rifaximin 550 MG Tabs tablet Commonly known  as:  XIFAXAN Take 550 mg by mouth 2 (two) times daily.   sodium bicarbonate 650 MG tablet Take 650 mg by mouth daily.   spironolactone 50 MG tablet Commonly known as:  ALDACTONE Take 50 mg by mouth daily.   zinc oxide 11.3 % Crea cream Commonly known as:  BALMEX Apply 1 application topically 3 (three) times daily as needed.   ZOLOFT PO Take 25 mg by mouth at bedtime.       Review of Systems  Constitutional: Negative for appetite change, chills, fatigue, fever and unexpected weight change.  HENT: Negative for congestion, rhinorrhea, sinus pressure, sneezing and sore throat.   Eyes: Negative for discharge, redness, itching and visual disturbance.  Respiratory: Negative for cough, chest tightness, shortness of breath and wheezing.   Cardiovascular: Negative for chest pain, palpitations and leg swelling.  Gastrointestinal: Negative for abdominal distention, abdominal pain, constipation, diarrhea, nausea and vomiting.  Endocrine: Negative for cold intolerance, heat intolerance, polydipsia, polyphagia and polyuria.  Genitourinary: Negative for dysuria, flank pain, frequency and urgency.  Musculoskeletal: Positive for gait problem.  Skin: Negative for color change, pallor, rash and wound.  Neurological: Negative for dizziness, syncope, light-headedness and headaches.  Hematological: Does not bruise/bleed easily.  Psychiatric/Behavioral: Negative for agitation, confusion and sleep disturbance. The patient is not nervous/anxious.     Immunization History  Administered Date(s) Administered  . Influenza-Unspecified 07/17/2017   Pertinent  Health Maintenance Due  Topic Date Due  . FOOT EXAM  12/29/1955  . OPHTHALMOLOGY EXAM  12/29/1955  . URINE MICROALBUMIN  [birth year]29/1955  . HEMOGLOBIN A1C  06/22/2017  . COLONOSCOPY  08/03/2019  . INFLUENZA VACCINE  Completed  . PNA vac Low Risk Adult  Completed   Fall Risk  05/19/2017  Falls in the past year? No    Vitals:   11/24/17 1056    BP: (!) 116/58  Pulse: 85  Resp: (!) 22  Temp: 98.4 F (36.9 C)  SpO2: 94%  Weight: 193 lb (87.5 kg)  Height: 6' 0.5" (1.842 m)   Body mass index is 25.82 kg/m. Physical Exam  Constitutional: He is oriented to person, place, and time.  Tall frail elderly in no acute distress     HENT:  Head: Normocephalic.  Right Ear: External ear normal.  Left Ear: External ear normal.  Mouth/Throat: Oropharynx is clear and moist. No oropharyngeal exudate.  Eyes: Conjunctivae and EOM are normal. Pupils are equal, round, and reactive to light. Right eye exhibits no discharge. Left eye exhibits no discharge. No scleral icterus.  Neck: Normal range of motion. No JVD present. No thyromegaly present.  Cardiovascular: Normal rate, regular rhythm, normal heart sounds and intact distal pulses. Exam reveals no gallop and no friction rub.  No murmur heard. Pulmonary/Chest: Effort normal and breath sounds normal. No respiratory distress. He has no wheezes. He has no rales.  Abdominal: Soft. Bowel sounds are normal. He exhibits distension. There is no tenderness. There is no rebound and no guarding.  Umbilical hernia   Musculoskeletal: He exhibits no edema or tenderness.  Moves x 4 extremities.unsteady gait uses FWW.   Lymphadenopathy:    He has no cervical adenopathy.  Neurological: He is oriented to person, place, and time. Coordination normal.  Skin: Skin is warm and dry. No rash noted. No erythema. No pallor.  Psychiatric: He has a normal mood and affect. His behavior is normal.   Labs reviewed: Recent Labs    02/13/17 0507 02/14/17 0519  03/19/17 1556  03/31/17  04/13/17 05/08/17 10/30/17  NA 139 136   < > 136   < > 136*   < > 137  137  137 137 134  K 4.0 4.1   < > 5.2*   < > 4.6   < > 4.9  4.9  4.9 5.0 3.9  CL 116* 111  --  106  --   --   --   --   --   --   CO2 20* 18*  --  24  --   --   --   --   --   --   GLUCOSE 123* 137*  --  232*  --   --   --   --   --   --   BUN 23* 24*   < > 37*    < > 43*   < > 37*  37*  37*  37* 41* 30*  CREATININE 1.31* 1.33*   < > 1.85*   < > 1.9*   < > 1.6*  1.6*  1.6* 1.5* 1.05  CALCIUM 8.1* 8.2*  --  8.8  --  8.2  --   --   --  8.0   < > = values in this interval not displayed.   Recent Labs    02/13/17 0507 02/14/17 0519  03/31/17 04/07/17 10/30/17  AST 35 38   < > 21 26 40  ALT 30 33   < > 21 27 36  ALKPHOS 111 111   < > 89 94 143  BILITOT 1.6* 1.5*  --   --   --  0.8  PROT 5.0* 4.9*  --  5.3  --  5.2  ALBUMIN 2.9* 2.8*  --  2.9  --  2.4   < > = values in this interval not displayed.   Recent Labs    12/20/16 0137  12/23/16 0539  02/11/17 2005 02/12/17 0521 02/13/17 0507  02/24/17 03/27/17 03/31/17 10/30/17  WBC 3.8*   < > 2.0*   < > 2.8* 2.2* 2.2*   < > 2.0 2.7 2.0 3.3  NEUTROABS 3.4  --  1.4*  --   --   --   --   --   --   --   --   --   HGB 10.7*   < > 8.8*   < > 11.2* 9.5* 9.9*   < > 9.4* 10.8* 10.4* 9.6  HCT 30.3*   < > 24.9*   < > 32.7* 27.7* 27.9*   < > 28* 31* 30* 28.0  MCV 88.3   < > 85.0   < > 87.4 86.8 86.9  --   --   --   --   --  PLT 38*   < > 31*   < > 44* 39* 41*   < > 37* 36* 37*  --    < > = values in this interval not displayed.   Lab Results  Component Value Date   TSH 2.49 06/12/2017   Lab Results  Component Value Date   HGBA1C 5.2 12/20/2016   Significant Diagnostic Results in last 30 days:  No results found.  Assessment/Plan  1. Acquired hypothyroidism Lab Results  Component Value Date   TSH 2.49 06/12/2017  Continue on levothyroxine 25 mcg tablet daily.Recheck TSH next visit.   2. Chronic depression Mood stable.continues to get OOB to chair daily though stays in the room throughout the day.continue on sertraline 25 mg tablet daily.continue to monitor for mood changes.  3. Liver cirrhosis secondary to NASH  Status post paracentesis 09/29/2017 with 10 liters of fluid removed.Ascites persist. No shortness of breath.Has 2-3 Bowel movements daily.continue on spironolactone 50 mg tablet  daily, furosemide 40 mg tablet daily,lactulose 10 gm three times daily and rifaximin 550 mg tablet twice daily.continue to follow up with Hospice service.   Family/ staff Communication: Reviewed plan of care with patient and facility Nurse.    Labs/tests ordered: None   Dinah C Ngetich, NP

## 2017-12-26 ENCOUNTER — Encounter: Payer: Self-pay | Admitting: Internal Medicine

## 2017-12-26 ENCOUNTER — Non-Acute Institutional Stay (SKILLED_NURSING_FACILITY): Payer: Medicare Other | Admitting: Internal Medicine

## 2017-12-26 DIAGNOSIS — K7581 Nonalcoholic steatohepatitis (NASH): Secondary | ICD-10-CM

## 2017-12-26 DIAGNOSIS — E039 Hypothyroidism, unspecified: Secondary | ICD-10-CM

## 2017-12-26 DIAGNOSIS — N183 Chronic kidney disease, stage 3 (moderate): Secondary | ICD-10-CM | POA: Diagnosis not present

## 2017-12-26 DIAGNOSIS — E872 Acidosis, unspecified: Secondary | ICD-10-CM

## 2017-12-26 DIAGNOSIS — D61818 Other pancytopenia: Secondary | ICD-10-CM | POA: Insufficient documentation

## 2017-12-26 DIAGNOSIS — F339 Major depressive disorder, recurrent, unspecified: Secondary | ICD-10-CM | POA: Diagnosis not present

## 2017-12-26 DIAGNOSIS — E440 Moderate protein-calorie malnutrition: Secondary | ICD-10-CM

## 2017-12-26 DIAGNOSIS — E1122 Type 2 diabetes mellitus with diabetic chronic kidney disease: Secondary | ICD-10-CM | POA: Diagnosis not present

## 2017-12-26 DIAGNOSIS — K7460 Unspecified cirrhosis of liver: Secondary | ICD-10-CM | POA: Diagnosis not present

## 2017-12-26 LAB — CMP 10231
Albumin: 2.4
CALCIUM: 8
CO2: 21
Chloride: 107
GFR CALC NON AF AMER: 72
Globulin: 2.8
Total Protein: 5.2

## 2017-12-26 NOTE — Progress Notes (Signed)
Location:  [facility] Room Number: 35 Place of Service:  SNF [phone]) Provider:  Blanchie Serve MD  Blanchie Serve, MD  Patient Care Team: Blanchie Serve, MD as PCP - General (Internal Medicine) Ngetich, Nelda Bucks, NP as Nurse Practitioner (Family Medicine)  Extended Emergency Contact Information Primary Emergency Contact: Suszanne Conners Address: [address]          [place], [place] [address] [place] Phone: [phone] Relation: Spouse  Code Status:  DNR  Goals of care: Advanced Directive information Advanced Directives 12/26/2017  Does Patient Have a Medical Advance Directive? Yes  Type of Paramedic of [place];Out of facility DNR (pink MOST or yellow form)  Does patient want to make changes to medical advance directive? No - Patient declined  Copy of [place] in Chart? Yes  Would patient like information on creating a medical advance directive? -  Pre-existing out of facility DNR order (yellow form or pink MOST form) Yellow form placed in chart (order not valid for inpatient use);Pink MOST form placed in chart (order not valid for inpatient use)     Chief Complaint  Patient presents with  . Medical Management of Chronic Issues    Routine Visit     HPI:  Pt is a 72 y.o. House seen today for medical management of chronic diseases.  He had a fall this morning. He rolled over from his bed and fell on the floor. No apparent injury per nursing. Patient seen in his room today with his charge nurse present. He denies any pain. He is alert and oriented.   NASH with liver cirrhosis- with ascites. aaox 3 this visit. Denies abdominal pain. Currently on rifaxamin, spironolactone, lasix and lactulose. Having 2-3 bowel movement a day. Also on sodium bicarbonate daily. stable edema, abdominal distension has increased per staff. Pt denies abdominal pain.   Hypothyroidism- takes levothyroxine 25 mcg  daily  Depression- mood has been stable. Currently on sertraline 25 mg daily.    Past Medical History:  Diagnosis Date  . Abdominal hernia   . Ascites   . Benign neoplasm of colon   . Cirrhosis ([place])   . Diabetes mellitus, type 2 ([place])    resolved with gastric bypass  . Diverticulosis of colon (without mention of hemorrhage)   . Esophageal varices ([place])   . Hyperlipemia   . Hypertension   . Iron deficiency anemia secondary to blood loss (chronic)   . Kidney stones   . Liver cyst   . Lymphopenia   . NASH (nonalcoholic steatohepatitis)   . Osteoarthritis of hip   . Renal cyst   . Splenomegaly   . Unspecified sleep apnea    Resolved   Past Surgical History:  Procedure Laterality Date  . ANKLE SURGERY     Bil  . COLONOSCOPY  2012  . GASTRIC BYPASS  2005  . IR PARACENTESIS  09/29/2017  . KNEE SURGERY     Bil  . POLYPECTOMY    . TOTAL HIP ARTHROPLASTY  2008   right    Allergies  Allergen Reactions  . Oxycodone Other (See Comments)    Reaction:  Caused pt to fall     Outpatient Encounter Medications as of 12/26/2017  Medication Sig  . acetaminophen (TYLENOL) 325 MG tablet Take 650 mg by mouth every 4 (four) hours as needed.  . calcium carbonate (OSCAL) 1500 (600 Ca) MG TABS tablet Take 600 mg of elemental calcium by  mouth daily.  . diphenoxylate-atropine (LOMOTIL) 2.5-0.025 MG tablet Take 1 tablet by mouth 4 (four) times daily as needed for diarrhea or loose stools.  . furosemide (LASIX) 40 MG tablet Take 40 mg by mouth daily.  Marland Kitchen lactulose (CHRONULAC) 10 GM/15ML solution Take 10 g 3 (three) times daily by mouth. Take 45 mL. Hold for >3 bowel movements per day  . levothyroxine (SYNTHROID, LEVOTHROID) 25 MCG tablet Take 25 mcg by mouth daily before breakfast.  . Multiple Vitamins-Minerals (MULTIVITAMIN WITH MINERALS) tablet Take 1 tablet by mouth daily.  . Nutritional Supplements (BOOST BREEZE PO) Take 1 Container by mouth 3 (three) times daily between meals.   .  rifaximin (XIFAXAN) 550 MG TABS tablet Take 550 mg by mouth 2 (two) times daily.  . Sertraline HCl (ZOLOFT PO) Take 25 mg by mouth at bedtime.   . sodium bicarbonate 650 MG tablet Take 650 mg by mouth daily.   Marland Kitchen spironolactone (ALDACTONE) 50 MG tablet Take 50 mg by mouth daily.  . [DISCONTINUED] zinc oxide (BALMEX) 11.3 % CREA cream Apply 1 application topically 3 (three) times daily as needed.   No facility-administered encounter medications on file as of 12/26/2017.     Review of Systems  Constitutional: Negative for appetite change, chills, diaphoresis and fever.  HENT: Negative for congestion, ear pain, hearing loss, mouth sores, postnasal drip, rhinorrhea, sore throat and trouble swallowing.   Eyes: Negative for visual disturbance.  Respiratory: Negative for cough and shortness of breath.   Cardiovascular: Negative for chest pain and palpitations.  Gastrointestinal: Negative for abdominal pain, blood in stool, constipation, nausea and vomiting.  Genitourinary: Negative for dysuria and frequency.  Musculoskeletal: Positive for gait problem. Negative for back pain.       Unsteady gait, needs walker  Skin: Negative for rash and wound.  Neurological: Negative for dizziness and headaches.  Psychiatric/Behavioral: Negative for behavioral problems, confusion and sleep disturbance. The patient is not nervous/anxious.     Immunization History  Administered Date(s) Administered  . Influenza-Unspecified 07/17/2017   Pertinent  Health Maintenance Due  Topic Date Due  . FOOT EXAM  12/29/1955  . OPHTHALMOLOGY EXAM  12/29/1955  . URINE MICROALBUMIN  [birth year]29/1955  . HEMOGLOBIN A1C  06/22/2017  . COLONOSCOPY  08/03/2019  . INFLUENZA VACCINE  Completed  . PNA vac Low Risk Adult  Completed   Fall Risk  05/19/2017  Falls in the past year? No   Functional Status Survey:    Vitals:   12/26/17 1204  BP: 117/73  Pulse: 79  Resp: 18  Temp: (!) 97 F (36.1 C)  TempSrc: Oral  SpO2: 96%   Weight: 190 lb (86.2 kg)  Height: 6' 0.5" (1.842 m)   Body mass index is 25.41 kg/m.   Wt Readings from Last 3 Encounters:  12/26/17 190 lb (86.2 kg)  11/24/17 193 lb (87.5 kg)  10/27/17 196 lb 14.4 oz (89.3 kg)   Physical Exam  Constitutional: He is oriented to person, place, and time. He appears well-developed. No distress.  Frail, elderly House  HENT:  Head: Normocephalic and atraumatic.  Right Ear: External ear normal.  Left Ear: External ear normal.  Nose: Nose normal.  Mouth/Throat: Oropharynx is clear and moist. No oropharyngeal exudate.  Eyes: Pupils are equal, round, and reactive to light. Conjunctivae and EOM are normal. Right eye exhibits no discharge. Left eye exhibits no discharge. No scleral icterus.  Neck: Normal range of motion. Neck supple.  Cardiovascular: Normal rate, regular rhythm and intact distal  pulses.  Pulmonary/Chest: Effort normal and breath sounds normal. He has no wheezes. He has no rales.  Abdominal: Soft. Bowel sounds are normal. He exhibits distension. There is no tenderness. There is no guarding.  Musculoskeletal: He exhibits edema.  Able to move all 4 extremities, unsteady gait, ambulates with walker with assistance otherwise wheelchair  Lymphadenopathy:    He has no cervical adenopathy.  Neurological: He is alert and oriented to person, place, and time. He exhibits normal muscle tone.  Skin: Skin is warm and dry. No rash noted. He is not diaphoretic.  Psychiatric: He has a normal mood and affect. His behavior is normal.    Labs reviewed: Recent Labs    02/13/17 0507 02/14/17 0519  03/19/17 1556  03/31/17  04/13/17 05/08/17 10/30/17  NA 139 136   < > 136   < > 136*   < > 137  137  137 137 134*  134  K 4.0 4.1   < > 5.2*   < > 4.6   < > 4.9  4.9  4.9 5.0 3.9  3.9  CL 116* 111  --  106  --   --   --   --   --  107  CO2 20* 18*  --  24  --   --   --   --   --  21  GLUCOSE 123* 137*  --  232*  --   --   --   --   --   --   BUN 23* 24*    < > 37*   < > 43*   < > 37*  37*  37*  37* 41* 30*  CREATININE 1.31* 1.33*   < > 1.85*   < > 1.9*   < > 1.6*  1.6*  1.6* 1.5* 1.1  1.05  CALCIUM 8.1* 8.2*  --  8.8  --  8.2  --   --   --  8.0  8.0   < > = values in this interval not displayed.   Recent Labs    02/13/17 0507 02/14/17 0519  03/31/17 04/07/17 10/30/17  AST 35 38   < > 21 26 40  40  ALT 30 33   < > 21 27 36  36  ALKPHOS 111 111   < > 89 94 143*  143  BILITOT 1.6* 1.5*  --   --   --  0.8  PROT 5.0* 4.9*  --  5.3  --  5.2  5.2  ALBUMIN 2.9* 2.8*  --  2.9  --  2.4  2.4   < > = values in this interval not displayed.   Recent Labs    02/11/17 2005 02/12/17 0521 02/13/17 0507  03/27/17 03/31/17 10/30/17  WBC 2.8* 2.2* 2.2*   < > 2.7 2.0 3.3  3.3  HGB 11.2* 9.5* 9.9*   < > 10.8* 10.4* 9.6*  9.6  HCT 32.7* 27.7* 27.9*   < > 31* 30* 28*  28.0  MCV 87.4 86.8 86.9  --   --   --   --   PLT 44* 39* 41*   < > 36* 37* 66*   < > = values in this interval not displayed.   Lab Results  Component Value Date   TSH 2.49 06/12/2017   Lab Results  Component Value Date   HGBA1C 5.2 12/20/2016   No results found for: CHOL, HDL, LDLCALC, LDLDIRECT, TRIG, CHOLHDL  Significant Diagnostic Results in  last 30 days:  No results found.  Assessment/Plan  1. Moderate protein-calorie malnutrition (HCC) Weight overall stable. Monitor po intake. Continue nutritional supplement. Continue skin care.   2. Pancytopenia ([place]) No bleed reported. secodary to liver cirrhosis with NASH. Monitor cbc  3. Liver cirrhosis secondary to NASH (HCC) Continue lasix and spironolactone, continue rifaxamin and lactulose. Weight stable. Currently symptom free and aaox 3.   4. Type 2 diabetes mellitus with stage 3 chronic kidney disease, without long-term current use of insulin (HCC) Check a1c and lipid. Off medication  5. Metabolic acidosis, normal anion gap (NAG) Resolved. Patient aaox 3. Improved renal function as well. D/c sodium  bicarbonate and monitor  6. Major depression, recurrent, chronic (HCC) Continue sertraline current regimen  7. Acquired hypothyroidism Continue levothyroxine, check TSH    Family/ staff Communication: reviewed care plan with patient and charge nurse.    Labs/tests ordered:  A1c, TSH, lipid, BMP in 2-3 weeks.    Blanchie Serve, MD Internal Medicine [facility] [address] [place], [place] [address] Cell Phone (Monday-Friday 8 am - 5 pm): [phone] On Call: [phone] and follow prompts after 5 pm and on weekends Office Phone: [phone] Office Fax: [phone]

## 2018-01-08 LAB — TSH: TSH: 2.6 (ref <=5.90)

## 2018-01-22 ENCOUNTER — Ambulatory Visit (HOSPITAL_COMMUNITY)
Admission: RE | Admit: 2018-01-22 | Discharge: 2018-01-22 | Disposition: A | Discharge status: Home / Self Care | Source: Ambulatory Visit | Attending: Family Medicine | Admitting: Family Medicine

## 2018-01-22 ENCOUNTER — Encounter: Payer: Self-pay | Admitting: Family

## 2018-01-22 ENCOUNTER — Other Ambulatory Visit (HOSPITAL_COMMUNITY): Payer: Self-pay | Admitting: Interventional Radiology

## 2018-01-22 ENCOUNTER — Non-Acute Institutional Stay (SKILLED_NURSING_FACILITY): Payer: Medicare Other | Admitting: Family

## 2018-01-22 DIAGNOSIS — R635 Abnormal weight gain: Secondary | ICD-10-CM

## 2018-01-22 DIAGNOSIS — K7460 Unspecified cirrhosis of liver: Secondary | ICD-10-CM | POA: Diagnosis not present

## 2018-01-22 DIAGNOSIS — F339 Major depressive disorder, recurrent, unspecified: Secondary | ICD-10-CM | POA: Diagnosis not present

## 2018-01-22 DIAGNOSIS — K7581 Nonalcoholic steatohepatitis (NASH): Secondary | ICD-10-CM | POA: Diagnosis not present

## 2018-01-22 DIAGNOSIS — R609 Edema, unspecified: Secondary | ICD-10-CM | POA: Diagnosis not present

## 2018-01-22 DIAGNOSIS — E039 Hypothyroidism, unspecified: Secondary | ICD-10-CM

## 2018-01-22 DIAGNOSIS — R188 Other ascites: Secondary | ICD-10-CM

## 2018-01-22 HISTORY — PX: IR PARACENTESIS: IMG2679

## 2018-01-22 MED ORDER — LIDOCAINE HCL (PF) 2 % IJ SOLN
INTRAMUSCULAR | Status: AC
  Filled 2018-01-22: qty 20

## 2018-01-22 MED ORDER — LIDOCAINE HCL (PF) 1 % IJ SOLN
INTRAMUSCULAR | Status: DC | PRN
  Administered 2018-01-22: 5 mL

## 2018-01-22 NOTE — Progress Notes (Signed)
Location:  [facility] Room Number: 35 Place of Service:  SNF (31) Provider: Tocarra Gassen FNP-C   Blanchie Serve, MD  Patient Care Team: Blanchie Serve, MD as PCP - General (Internal Medicine) Michalene Debruler, Nelda Bucks, NP as Nurse Practitioner (Family Medicine)  Extended Emergency Contact Information Primary Emergency Contact: Suszanne Conners Address: [address]          [place], [place] [address] [place] Phone: [phone] Relation: Spouse  Code Status:  DNR Goals of care: Advanced Directive information Advanced Directives 01/22/2018  Does Patient Have a Medical Advance Directive? Yes  Type of Paramedic of [place];Out of facility DNR (pink MOST or yellow form)  Does patient want to make changes to medical advance directive? -  Copy of [place] in Chart? Yes  Would patient like information on creating a medical advance directive? -  Pre-existing out of facility DNR order (yellow form or pink MOST form) Yellow form placed in chart (order not valid for inpatient use);Pink MOST form placed in chart (order not valid for inpatient use)     Chief Complaint  Patient presents with  . Medical Management of Chronic Issues    routine visit    HPI:  Pt is a 72 y.o. House seen today [place] for medical management of chronic diseases.He has a medical history of HTN,CKD stage 4,Thrombocytopenia,Liver cirrhosis secondary to NASH,hypothyroidism,Depression among other conditions.He is seen in his room today with facility Nurse present at bedside.He complains of increased abdominal girth and discomfort.Order in place for paracentesis today at 1pm at [facility] outpatient.He walks with walker to and back from the bathroom without any assistance.He denies any shortness of breath except abdominal discomfort.He has had no recent fall episodes.he has had a 16 lbs weight gain over one month on weight log review.    Past Medical History:  Diagnosis Date  . Abdominal hernia   . Ascites   . Benign neoplasm of colon   . Cirrhosis ([place])   . Diabetes mellitus, type 2 ([place])    resolved with gastric bypass  . Diverticulosis of colon (without mention of hemorrhage)   . Esophageal varices ([name])   . Hyperlipemia   . Hypertension   . Iron deficiency anemia secondary to blood loss (chronic)   . Kidney stones   . Liver cyst   . Lymphopenia   . NASH (nonalcoholic steatohepatitis)   . Osteoarthritis of hip   . Renal cyst   . Splenomegaly   . Unspecified sleep apnea    Resolved   Past Surgical History:  Procedure Laterality Date  . ANKLE SURGERY     Bil  . COLONOSCOPY  2012  . GASTRIC BYPASS  2005  . IR PARACENTESIS  09/29/2017  . IR PARACENTESIS  01/22/2018  . KNEE SURGERY     Bil  . POLYPECTOMY    . TOTAL HIP ARTHROPLASTY  2008   right    Allergies  Allergen Reactions  . Oxycodone Other (See Comments)    Reaction:  Caused pt to fall     Allergies as of 01/22/2018      Reactions   Oxycodone Other (See Comments)   Reaction:  Caused pt to fall       Medication List        Accurate as of 01/22/18  5:26 PM. Always use your most recent med list.          acetaminophen 325 MG  tablet Commonly known as:  TYLENOL Take 650 mg by mouth every 4 (four) hours as needed.   BOOST BREEZE PO Take 1 Container by mouth 3 (three) times daily between meals.   calcium carbonate 1500 (600 Ca) MG Tabs tablet Commonly known as:  OSCAL Take 600 mg of elemental calcium by mouth daily.   diphenoxylate-atropine 2.5-0.025 MG tablet Commonly known as:  LOMOTIL Take 1 tablet by mouth 4 (four) times daily as needed for diarrhea or loose stools.   furosemide 40 MG tablet Commonly known as:  LASIX Take 40 mg by mouth daily.   lactulose 10 GM/15ML solution Commonly known as:  CHRONULAC Take 10 g 3 (three) times daily by mouth. Take 45 mL. Hold for >3 bowel movements per day   levothyroxine 25 MCG  tablet Commonly known as:  SYNTHROID, LEVOTHROID Take 25 mcg by mouth daily before breakfast.   multivitamin with minerals tablet Take 1 tablet by mouth daily.   rifaximin 550 MG Tabs tablet Commonly known as:  XIFAXAN Take 550 mg by mouth 2 (two) times daily.   spironolactone 50 MG tablet Commonly known as:  ALDACTONE Take 50 mg by mouth daily.   ZOLOFT PO Take 25 mg by mouth at bedtime.       Review of Systems  Constitutional: Positive for unexpected weight change. Negative for activity change, appetite change, chills, fatigue and fever.  HENT: Negative for congestion, rhinorrhea, sinus pressure, sinus pain, sneezing and sore throat.   Eyes: Negative for discharge, redness, itching and visual disturbance.  Respiratory: Negative for cough, chest tightness, shortness of breath and wheezing.   Cardiovascular: Positive for leg swelling. Negative for chest pain and palpitations.  Gastrointestinal: Positive for abdominal distention. Negative for abdominal pain, constipation, diarrhea, nausea and vomiting.       Increased abdominal girth   Endocrine: Negative for cold intolerance, heat intolerance, polydipsia, polyphagia and polyuria.  Genitourinary: Negative for dysuria, flank pain, frequency and urgency.  Musculoskeletal: Positive for gait problem.  Skin: Negative for color change, pallor, rash and wound.  Neurological: Negative for dizziness, light-headedness and headaches.  Hematological: Does not bruise/bleed easily.  Psychiatric/Behavioral: Negative for agitation, confusion, hallucinations and sleep disturbance. The patient is not nervous/anxious.     Immunization History  Administered Date(s) Administered  . Influenza-Unspecified 07/17/2017   Pertinent  Health Maintenance Due  Topic Date Due  . FOOT EXAM  12/29/1955  . OPHTHALMOLOGY EXAM  12/29/1955  . URINE MICROALBUMIN  12/29/1955  . HEMOGLOBIN A1C  06/22/2017  . INFLUENZA VACCINE  05/07/2018  . COLONOSCOPY   08/03/2019  . PNA vac Low Risk Adult  Completed   Fall Risk  05/19/2017  Falls in the past year? No   Functional Status Survey:    Vitals:   01/22/18 1317  BP: 119/60  Pulse: 72  Resp: 20  Temp: 98.6 F (37 C)  SpO2: 95%  Weight: 205 lb (93 kg)  Height: 6' 0.5" (1.842 m)   Body mass index is 27.42 kg/m. Physical Exam  Constitutional: He is oriented to person, place, and time.  Frail elderly in no acute distress   HENT:  Head: Normocephalic.  Right Ear: External ear normal.  Left Ear: External ear normal.  Mouth/Throat: Oropharynx is clear and moist. No oropharyngeal exudate.  Eyes: Pupils are equal, round, and reactive to light. Conjunctivae and EOM are normal. Right eye exhibits no discharge. Left eye exhibits no discharge. No scleral icterus.  Neck: Normal range of motion. No tracheal deviation present.  No thyromegaly present.  Cardiovascular: Normal rate, regular rhythm, normal heart sounds and intact distal pulses. Exam reveals no gallop and no friction rub.  No murmur heard. Pulmonary/Chest: Effort normal and breath sounds normal. No respiratory distress. He has no wheezes. He has no rales. He exhibits no tenderness.  Abdominal: Soft. Bowel sounds are normal. He exhibits distension. He exhibits no mass. There is no tenderness. There is no guarding. A hernia is present.  Genitourinary:  Genitourinary Comments: Continent   Musculoskeletal: He exhibits no tenderness.  Moves x 4 extremities.unsteady gait uses FWW now without assistance for short distance and wheelchair for long distance.  Bilateral lower extremities pitting edema.   Lymphadenopathy:    He has no cervical adenopathy.  Neurological: He is oriented to person, place, and time. He has normal strength. Gait abnormal.  Skin: Skin is warm and dry. No rash noted. No erythema. No pallor.  Psychiatric: He has a normal mood and affect. His speech is normal and behavior is normal. Judgment and thought content normal.      Labs reviewed: Recent Labs    02/13/17 0507 02/14/17 0519  03/19/17 1556  03/31/17  04/13/17 05/08/17 10/30/17  NA 139 136   < > 136   < > 136*   < > 137  137  137 137 134*  134  K 4.0 4.1   < > 5.2*   < > 4.6   < > 4.9  4.9  4.9 5.0 3.9  3.9  CL 116* 111  --  106  --   --   --   --   --  107  CO2 20* 18*  --  24  --   --   --   --   --  21  GLUCOSE 123* 137*  --  232*  --   --   --   --   --   --   BUN 23* 24*   < > 37*   < > 43*   < > 37*  37*  37*  37* [name]* 30*  CREATININE 1.31* 1.33*   < > 1.85*   < > 1.9*   < > 1.6*  1.6*  1.6* 1.5* 1.1  1.05  CALCIUM 8.1* 8.2*  --  8.8  --  8.2  --   --   --  8.0  8.0   < > = values in this interval not displayed.   Recent Labs    02/13/17 0507 02/14/17 0519  03/31/17 04/07/17 10/30/17  AST 35 38   < > 21 26 40  40  ALT 30 33   < > 21 27 36  36  ALKPHOS 111 111   < > 89 94 143*  143  BILITOT 1.6* 1.5*  --   --   --  0.8  PROT 5.0* 4.9*  --  5.3  --  5.2  5.2  ALBUMIN 2.9* 2.8*  --  2.9  --  2.4  2.4   < > = values in this interval not displayed.   Recent Labs    02/11/17 2005 02/12/17 0521 02/13/17 0507  03/27/17 03/31/17 10/30/17  WBC 2.8* 2.2* 2.2*   < > 2.7 2.0 3.3  3.3  HGB 11.2* 9.5* 9.9*   < > 10.8* 10.4* 9.6*  9.6  HCT 32.7* 27.7* 27.9*   < > 31* 30* 28*  28.0  MCV 87.4 86.8 86.9  --   --   --   --  PLT 44* 39* [name]*   < > 36* 37* 66*   < > = values in this interval not displayed.   Lab Results  Component Value Date   TSH 2.60 01/08/2018   Lab Results  Component Value Date   HGBA1C 5.2 12/20/2016   No results found for: CHOL, HDL, LDLCALC, LDLDIRECT, TRIG, CHOLHDL  Significant Diagnostic Results in last 30 days:  Ir Paracentesis  Result Date: 01/22/2018 INDICATION: Abdominal distention secondary to recurrent ascites. Request for therapeutic paracentesis. EXAM: ULTRASOUND GUIDED RIGHT LOWER QUADRANT PARACENTESIS MEDICATIONS: None. COMPLICATIONS: None immediate. PROCEDURE: Informed written  consent was obtained from the patient after a discussion of the risks, benefits and alternatives to treatment. A timeout was performed prior to the initiation of the procedure. Initial ultrasound scanning demonstrates a large amount of ascites within the right lower abdominal quadrant. The right lower abdomen was prepped and draped in the usual sterile fashion. 1% lidocaine with epinephrine was used for local anesthesia. Following this, a 19 gauge, 7-cm, Yueh catheter was introduced. An ultrasound image was saved for documentation purposes. The paracentesis was performed. The catheter was removed and a dressing was applied. The patient tolerated the procedure well without immediate post procedural complication. FINDINGS: A total of approximately 12 L of clear yellow fluid was removed. IMPRESSION: Successful ultrasound-guided paracentesis yielding 12 liters of peritoneal fluid. Read by: Ascencion Dike PA-C Electronically Signed   By: Jacqulynn Cadet M.D.   On: 01/22/2018 15:02    Assessment/Plan 1. Edema Bilateral lower extremities pitting edema.Possible due to worsening ascites.No shortness of breath,wheezing,rales or cough noted.continue on furosemide 40 mg tablet daily.He is scheduled for paracentesis today at 1 Pm.continue to monitor weight.   2. Liver cirrhosis secondary to NASH  Non-alcoholic.Worsening ascites scheduled for paracentesis today at 1 Pm.continues to have 2-3 bowel movements.continue on rifaximin 550 mg tablet twice daily,furosemide 40 mg tablet daily and spironolactone 50 mg tablet daily.  3.  hypothyroidism Lab Results  Component Value Date   TSH 2.60 01/08/2018  Continue on levothyroxine 25 mcg tablet daily.continue to monitor TSH level.   4. Major depression Mood stable.continue on sertraline 25 mg tablet daily at bedtime. Continue to monitor for mood changes.   6.Weight Gain  Weight log reviewed 16 pounds weight gain over one month noted.Suspect due to ascites.Has  paracentesis today at 1 pm.continue to monitor.continue on diuretics as above.   Family/ staff Communication: Reviewed plan of care with patient and facility Nurse.   Labs/tests ordered: None   Hewitt Garner C Nikki Glanzer, NP

## 2018-01-22 NOTE — Procedures (Signed)
PROCEDURE SUMMARY:  Successful US guided paracentesis from RLQ.  Yielded 12 L of clear yellow fluid.  No immediate complications.  Pt tolerated well.   Specimen was not sent for labs.  Ascencion Dike PA-C 01/22/2018 2:59 PM

## 2018-02-13 ENCOUNTER — Other Ambulatory Visit: Payer: Self-pay | Admitting: Internal Medicine

## 2018-02-13 DIAGNOSIS — R188 Other ascites: Secondary | ICD-10-CM

## 2018-02-17 ENCOUNTER — Ambulatory Visit (HOSPITAL_COMMUNITY)
Admission: RE | Admit: 2018-02-17 | Discharge: 2018-02-17 | Disposition: A | Discharge status: Home / Self Care | Source: Ambulatory Visit | Attending: Internal Medicine | Admitting: Internal Medicine

## 2018-02-17 ENCOUNTER — Encounter (HOSPITAL_COMMUNITY): Payer: Self-pay | Admitting: Student

## 2018-02-17 DIAGNOSIS — R188 Other ascites: Secondary | ICD-10-CM | POA: Insufficient documentation

## 2018-02-17 HISTORY — PX: IR PARACENTESIS: IMG2679

## 2018-02-17 MED ORDER — LIDOCAINE HCL (PF) 2 % IJ SOLN
INTRAMUSCULAR | Status: AC
  Filled 2018-02-17: qty 20

## 2018-02-17 NOTE — Procedures (Addendum)
PROCEDURE SUMMARY:  Successful US guided paracentesis from right lateral abdomen.  Yielded 13.0 liters of clear, yellow fluid.  No immediate complications.  Pt tolerated well.   Specimen was not sent for labs.  Docia Barrier PA-C 02/17/2018 1:26 PM     Addendum:  Note routed to patient's facility as requested. [facility]

## 2018-02-18 ENCOUNTER — Non-Acute Institutional Stay (SKILLED_NURSING_FACILITY): Payer: Medicare Other | Admitting: Family

## 2018-02-18 ENCOUNTER — Encounter: Payer: Self-pay | Admitting: Family

## 2018-02-18 DIAGNOSIS — R609 Edema, unspecified: Secondary | ICD-10-CM

## 2018-02-18 DIAGNOSIS — K7581 Nonalcoholic steatohepatitis (NASH): Secondary | ICD-10-CM

## 2018-02-18 DIAGNOSIS — K7460 Unspecified cirrhosis of liver: Secondary | ICD-10-CM

## 2018-02-18 DIAGNOSIS — E039 Hypothyroidism, unspecified: Secondary | ICD-10-CM | POA: Diagnosis not present

## 2018-02-18 NOTE — Progress Notes (Signed)
Location:  [facility] Room Number: 35 Place of Service:  SNF (31) Provider: Heiley Shaikh FNP-C   Blanchie Serve, MD  Patient Care Team: Blanchie Serve, MD as PCP - General (Internal Medicine) [place] Ganoe, Nelda Bucks, NP as Nurse Practitioner (Family Medicine)  Extended Emergency Contact Information Primary Emergency Contact: Suszanne Conners Address: [address]          [place], [place] [address] [place] Phone: [phone] Relation: Spouse  Code Status: DNR Goals of care: Advanced Directive information Advanced Directives 02/18/2018  Does Patient Have a Medical Advance Directive? Yes  Type of Paramedic of [place];Out of facility DNR (pink MOST or yellow form)  Does patient want to make changes to medical advance directive? -  Copy of [place] in Chart? Yes  Would patient like information on creating a medical advance directive? -  Pre-existing out of facility DNR order (yellow form or pink MOST form) Yellow form placed in chart (order not valid for inpatient use);Pink MOST form placed in chart (order not valid for inpatient use)     Chief Complaint  Patient presents with  . Medical Management of Chronic Issues    HPI:  Pt is a 72 y.o. male seen today [facility] for medical management of chronic diseases. He is seen in his room today with facility Nurse present at bedside.He has a medical history of HTN,CAD,Liver cirrhosis secondary to NASH,hypothyroidism, Depression,type 2 DM controlled among other conditions.he denies any acute issues during visit.He  States had paracentesis 02/17/2018 with 14 liters of drainage removed.He denies any shortness of breath.He continues to ambulate to the bathroom using his walker though spends most of the time in his bed/recliner.He describes his appetite as good.No recent fall episode reported.   Past Medical History:  Diagnosis Date  . Abdominal hernia    . Ascites   . Benign neoplasm of colon   . Cirrhosis ([place])   . Diabetes mellitus, type 2 ([place])    resolved with gastric bypass  . Diverticulosis of colon (without mention of hemorrhage)   . Esophageal varices ([place])   . Hyperlipemia   . Hypertension   . Iron deficiency anemia secondary to blood loss (chronic)   . Kidney stones   . Liver cyst   . Lymphopenia   . NASH (nonalcoholic steatohepatitis)   . Osteoarthritis of hip   . Renal cyst   . Splenomegaly   . Unspecified sleep apnea    Resolved   Past Surgical History:  Procedure Laterality Date  . ANKLE SURGERY     Bil  . COLONOSCOPY  2012  . GASTRIC BYPASS  2005  . IR PARACENTESIS  09/29/2017  . IR PARACENTESIS  01/22/2018  . IR PARACENTESIS  02/17/2018  . KNEE SURGERY     Bil  . POLYPECTOMY    . TOTAL HIP ARTHROPLASTY  2008   right    Allergies  Allergen Reactions  . Oxycodone Other (See Comments)    Reaction:  Caused pt to fall     Allergies as of 02/18/2018      Reactions   Oxycodone Other (See Comments)   Reaction:  Caused pt to fall       Medication List        Accurate as of 02/18/18  2:28 PM. Always use your most recent med list.          acetaminophen 325 MG tablet Commonly known as:  TYLENOL  Take 650 mg by mouth every 4 (four) hours as needed.   BOOST BREEZE PO Take 1 Container by mouth 3 (three) times daily between meals.   diphenoxylate-atropine 2.5-0.025 MG tablet Commonly known as:  LOMOTIL Take 1 tablet by mouth 4 (four) times daily as needed for diarrhea or loose stools.   furosemide 40 MG tablet Commonly known as:  LASIX Take 40 mg by mouth daily.   lactulose 10 GM/15ML solution Commonly known as:  CHRONULAC Take 10 g 3 (three) times daily by mouth. Take 45 mL. Hold for >3 bowel movements per day   levothyroxine 25 MCG tablet Commonly known as:  SYNTHROID, LEVOTHROID Take 25 mcg by mouth daily before breakfast.   rifaximin 550 MG Tabs tablet Commonly known as:  XIFAXAN Take  550 mg by mouth 2 (two) times daily.   spironolactone 50 MG tablet Commonly known as:  ALDACTONE Take 50 mg by mouth daily.   ZOLOFT PO Take 25 mg by mouth at bedtime.       Review of Systems  Constitutional: Negative for appetite change, chills, fatigue and fever.  HENT: Negative for congestion, rhinorrhea, sinus pressure, sinus pain, sneezing, sore throat and trouble swallowing.   Eyes: Negative for discharge, redness and visual disturbance.  Respiratory: Negative for cough, chest tightness, shortness of breath and wheezing.   Cardiovascular: Positive for leg swelling. Negative for chest pain and palpitations.  Gastrointestinal: Negative for abdominal distention, abdominal pain, constipation, diarrhea, nausea and vomiting.  Endocrine: Negative for cold intolerance, heat intolerance, polydipsia, polyphagia and polyuria.  Genitourinary: Negative for dysuria, flank pain, frequency and urgency.  Musculoskeletal: Positive for gait problem.  Skin: Negative for color change, pallor and rash.       Right abdomen paracentesis site dressing.  Neurological: Negative for dizziness, light-headedness and headaches.  Hematological: Does not bruise/bleed easily.  Psychiatric/Behavioral: Negative for agitation, confusion and sleep disturbance. The patient is not nervous/anxious.     Immunization History  Administered Date(s) Administered  . Influenza-Unspecified 07/17/2017   Pertinent  Health Maintenance Due  Topic Date Due  . FOOT EXAM  12/29/1955  . OPHTHALMOLOGY EXAM  [birth year]29/1955  . URINE MICROALBUMIN  [birth year]29/1956  . HEMOGLOBIN A1C  06/22/2017  . INFLUENZA VACCINE  05/07/2018  . COLONOSCOPY  08/03/2019  . PNA vac Low Risk Adult  Completed   Fall Risk  05/19/2017  Falls in the past year? No   Functional Status Survey:    Vitals:   02/18/18 0954  BP: 125/67  Pulse: 78  Resp: 15  Temp: (!) 94.8 F (34.9 C)  SpO2: 97%  Weight: 198 lb (89.8 kg)  Height: 6' 0.5" (1.842 m)    Body mass index is 26.48 kg/m. Physical Exam  Constitutional:  Tall frail elderly in no acute distress   HENT:  Head: Normocephalic.  Right Ear: External ear normal.  Left Ear: External ear normal.  Mouth/Throat: Oropharynx is clear and moist. No oropharyngeal exudate.  Eyes: Pupils are equal, round, and reactive to light. Conjunctivae and EOM are normal. Right eye exhibits no discharge. Left eye exhibits no discharge. No scleral icterus.  Neck: Normal range of motion. No JVD present. No thyromegaly present.  Cardiovascular: Normal rate, regular rhythm, normal heart sounds and intact distal pulses. Exam reveals no gallop and no friction rub.  Pulmonary/Chest: Effort normal and breath sounds normal. No stridor. No respiratory distress. He has no wheezes. He has no rales.  Abdominal: Soft. Bowel sounds are normal. He exhibits no distension and no  mass. There is no tenderness. There is no rebound and no guarding.  Umbilical hernia reducible.  Musculoskeletal: He exhibits no tenderness.  Unsteady gait uses FWW.bilateral lower extremities 1-2+ edema.  Lymphadenopathy:    He has no cervical adenopathy.  Neurological: Gait abnormal.  Alert and orient to person and place.  Skin: Skin is warm and dry. No rash noted. No erythema. No pallor.  Right lower quadrant paracentesis site dressing dry,clean and intact.surrounding skin tissue without any signs of infections.   Psychiatric: He has a normal mood and affect. His speech is normal and behavior is normal. Judgment and thought content normal.    Labs reviewed: Recent Labs    03/19/17 1556  03/31/17  04/13/17 05/08/17 10/30/17  NA 136   < > 136*   < > 137  137  137 137 134*  134  K 5.2*   < > 4.6   < > 4.9  4.9  4.9 5.0 3.9  3.9  CL 106  --   --   --   --   --  107  CO2 24  --   --   --   --   --  21  GLUCOSE 232*  --   --   --   --   --   --   BUN 37*   < > 43*   < > 37*  37*  37*  37* 41* 30*  CREATININE 1.85*   < > 1.9*   <  > 1.6*  1.6*  1.6* 1.5* 1.1  1.05  CALCIUM 8.8  --  8.2  --   --   --  8.0  8.0   < > = values in this interval not displayed.   Recent Labs    03/31/17 04/07/17 10/30/17  AST 21 26 40  40  ALT 21 27 36  36  ALKPHOS 89 94 143*  143  BILITOT  --   --  0.8  PROT 5.3  --  5.2  5.2  ALBUMIN 2.9  --  2.4  2.4   Recent Labs    03/27/17 03/31/17 10/30/17  WBC 2.7 2.0 3.3  3.3  HGB 10.8* 10.4* 9.6*  9.6  HCT 31* 30* 28*  28.0  PLT 36* 37* 66*   Lab Results  Component Value Date   TSH 2.60 01/08/2018   Lab Results  Component Value Date   HGBA1C 5.2 12/20/2016   Significant Diagnostic Results in last 30 days:  Ir Paracentesis  Result Date: 02/17/2018 INDICATION: Patient with recurrent ascites. Request is made for therapeutic paracentesis. EXAM: ULTRASOUND GUIDED THERAPEUTIC PARACENTESIS MEDICATIONS: 10 mL 2% lidocaine COMPLICATIONS: None immediate. PROCEDURE: Informed written consent was obtained from the patient after a discussion of the risks, benefits and alternatives to treatment. A timeout was performed prior to the initiation of the procedure. Initial ultrasound scanning demonstrates a large amount of ascites within the right lower abdominal quadrant. The right lower abdomen was prepped and draped in the usual sterile fashion. 2% lidocaine was used for local anesthesia. Following this, a 19 gauge, 7-cm, Yueh catheter was introduced. An ultrasound image was saved for documentation purposes. The paracentesis was performed. The catheter was removed and a dressing was applied. The patient tolerated the procedure well without immediate post procedural complication. FINDINGS: A total of approximately 13.0 liters of clear, yellow fluid was removed. IMPRESSION: Successful ultrasound-guided therapeutic paracentesis yielding 13.0 liters of peritoneal fluid. Read by: Brynda Greathouse PA-C Electronically Signed  By: Eugenie Filler M.D.   On: 02/17/2018 13:22   Ir Paracentesis  Result Date:  01/22/2018 INDICATION: Abdominal distention secondary to recurrent ascites. Request for therapeutic paracentesis. EXAM: ULTRASOUND GUIDED RIGHT LOWER QUADRANT PARACENTESIS MEDICATIONS: None. COMPLICATIONS: None immediate. PROCEDURE: Informed written consent was obtained from the patient after a discussion of the risks, benefits and alternatives to treatment. A timeout was performed prior to the initiation of the procedure. Initial ultrasound scanning demonstrates a large amount of ascites within the right lower abdominal quadrant. The right lower abdomen was prepped and draped in the usual sterile fashion. 1% lidocaine with epinephrine was used for local anesthesia. Following this, a 19 gauge, 7-cm, Yueh catheter was introduced. An ultrasound image was saved for documentation purposes. The paracentesis was performed. The catheter was removed and a dressing was applied. The patient tolerated the procedure well without immediate post procedural complication. FINDINGS: A total of approximately 12 L of clear yellow fluid was removed. IMPRESSION: Successful ultrasound-guided paracentesis yielding 12 liters of peritoneal fluid. Read by: Ascencion Dike PA-C Electronically Signed   By: Jacqulynn Cadet M.D.   On: 01/22/2018 15:02  Assessment/Plan 1.  hypothyroidism Lab Results  Component Value Date   TSH 2.60 01/08/2018  Continue on levothyroxine 25 mcg tablet daily.monitor TSH level periodically.   2. Liver cirrhosis secondary to NASH [facility]) Status post paracentesis 02/17/2018 13 liters of fluid removed.No shortness of breath.continue on Lactulose 10 gm three times daily,Aldactone 50 mg tablet daily,Furosemide 40 mg tablet daily and Rifaximin 550 mg tablet twice daily.   3. Edema 1-2+ edema.Ted hose ordered in previous visit but patient refused to wear ted hose.continue to elevate legs.continue on Aldactone 50 mg tablet and Furosemide 40 mg tablet daily.monitor weight.  Family/ staff Communication: Reviewed  plan of care with patient and facility Nurse  Labs/tests ordered: None   Nelda Bucks Krystie Leiter, NP

## 2018-03-23 ENCOUNTER — Encounter: Payer: Self-pay | Admitting: Internal Medicine

## 2018-03-23 ENCOUNTER — Non-Acute Institutional Stay (SKILLED_NURSING_FACILITY): Payer: Medicare Other | Admitting: Internal Medicine

## 2018-03-23 DIAGNOSIS — F339 Major depressive disorder, recurrent, unspecified: Secondary | ICD-10-CM

## 2018-03-23 DIAGNOSIS — I10 Essential (primary) hypertension: Secondary | ICD-10-CM

## 2018-03-23 DIAGNOSIS — E039 Hypothyroidism, unspecified: Secondary | ICD-10-CM | POA: Diagnosis not present

## 2018-03-23 DIAGNOSIS — D696 Thrombocytopenia, unspecified: Secondary | ICD-10-CM

## 2018-03-23 DIAGNOSIS — R188 Other ascites: Secondary | ICD-10-CM

## 2018-03-23 DIAGNOSIS — K7460 Unspecified cirrhosis of liver: Secondary | ICD-10-CM

## 2018-03-23 DIAGNOSIS — N184 Chronic kidney disease, stage 4 (severe): Secondary | ICD-10-CM

## 2018-03-23 DIAGNOSIS — K7581 Nonalcoholic steatohepatitis (NASH): Secondary | ICD-10-CM | POA: Diagnosis not present

## 2018-03-23 NOTE — Progress Notes (Signed)
Location:  [facility] Room Number: 35 Place of Service:  SNF [phone]) Provider:  Blanchie Serve MD  Blanchie Serve, MD  Patient Care Team: Blanchie Serve, MD as PCP - General (Internal Medicine) [place], Nelda Bucks, NP as Nurse Practitioner (Family Medicine)  Extended Emergency Contact Information Primary Emergency Contact: Suszanne Conners Address: [address]          [place], [place] [address] [place] Phone: [phone] Relation: Spouse  Code Status:  DNR  Goals of care: Advanced Directive information Advanced Directives 03/23/2018  Does Patient Have a Medical Advance Directive? Yes  Type of Paramedic of [place];Out of facility DNR (pink MOST or yellow form)  Does patient want to make changes to medical advance directive? -  Copy of [place] in Chart? Yes  Would patient like information on creating a medical advance directive? -  Pre-existing out of facility DNR order (yellow form or pink MOST form) Yellow form placed in chart (order not valid for inpatient use);Pink MOST form placed in chart (order not valid for inpatient use)     Chief Complaint  Patient presents with  . Medical Management of Chronic Issues    HPI:  Pt is a 72 y.o. male seen today for medical management of chronic diseases. He is seen in his room today. He denies any concern. He would like his lactulose dosing reduced if possible. He appears short of breath in between sentences while talking but denies any dyspnea.    Past Medical History:  Diagnosis Date  . Abdominal hernia   . Ascites   . Benign neoplasm of colon   . Cirrhosis ([place])   . Diabetes mellitus, type 2 ([place])    resolved with gastric bypass  . Diverticulosis of colon (without mention of hemorrhage)   . Esophageal varices ([place])   . Hyperlipemia   . Hypertension   . Iron deficiency anemia secondary to blood loss (chronic)   . Kidney stones   . Liver  cyst   . Lymphopenia   . NASH (nonalcoholic steatohepatitis)   . Osteoarthritis of hip   . Renal cyst   . Splenomegaly   . Unspecified sleep apnea    Resolved   Past Surgical History:  Procedure Laterality Date  . ANKLE SURGERY     Bil  . COLONOSCOPY  2012  . GASTRIC BYPASS  2005  . IR PARACENTESIS  09/29/2017  . IR PARACENTESIS  01/22/2018  . IR PARACENTESIS  02/17/2018  . KNEE SURGERY     Bil  . POLYPECTOMY    . TOTAL HIP ARTHROPLASTY  2008   right    Allergies  Allergen Reactions  . Oxycodone Other (See Comments)    Reaction:  Caused pt to fall     Outpatient Encounter Medications as of 03/23/2018  Medication Sig  . acetaminophen (TYLENOL) 325 MG tablet Take 650 mg by mouth every 4 (four) hours as needed.  . diphenoxylate-atropine (LOMOTIL) 2.5-0.025 MG tablet Take 1 tablet by mouth 4 (four) times daily as needed for diarrhea or loose stools.  . furosemide (LASIX) 40 MG tablet Take 40 mg by mouth daily.  Marland Kitchen lactulose (CHRONULAC) 10 GM/15ML solution Take 10 g 3 (three) times daily by mouth. Take 45 mL. Hold for >3 bowel movements per day  . levothyroxine (SYNTHROID, LEVOTHROID) 25 MCG tablet Take 25 mcg by mouth daily before breakfast.  . Nutritional Supplements (BOOST BREEZE PO) Take 1 Container by  mouth 3 (three) times daily between meals.   . rifaximin (XIFAXAN) 550 MG TABS tablet Take 550 mg by mouth 2 (two) times daily.  . Sertraline HCl (ZOLOFT PO) Take 25 mg by mouth at bedtime.   Marland Kitchen spironolactone (ALDACTONE) 50 MG tablet Take 50 mg by mouth daily.   No facility-administered encounter medications on file as of 03/23/2018.     Review of Systems  Constitutional: Positive for fatigue. Negative for appetite change, chills and fever.  HENT: Negative for congestion, mouth sores, postnasal drip and trouble swallowing.   Respiratory: Negative for cough, shortness of breath and wheezing.   Cardiovascular: Positive for leg swelling. Negative for chest pain and  palpitations.  Gastrointestinal: Positive for abdominal distention. Negative for abdominal pain, blood in stool, constipation, diarrhea, nausea and vomiting.  Genitourinary: Negative for dysuria, flank pain and frequency.  Musculoskeletal: Positive for gait problem. Negative for arthralgias and back pain.       Had an unwitnessed fall on 03/19/18 where he was found on the floor, no LOC, was complaining of neck pain to right side post fall that has now resolved and during his neurochecks, imaging of C-spine was recommended that the patient declined.   Skin: Negative for rash.  Neurological: Negative for dizziness and headaches.  Psychiatric/Behavioral: Positive for confusion. Negative for behavioral problems and suicidal ideas. The patient is not nervous/anxious.     Immunization History  Administered Date(s) Administered  . Influenza-Unspecified 07/17/2017   Pertinent  Health Maintenance Due  Topic Date Due  . FOOT EXAM  12/29/1955  . OPHTHALMOLOGY EXAM  12/29/1955  . URINE MICROALBUMIN  [birth year]29/1955  . HEMOGLOBIN A1C  06/22/2017  . INFLUENZA VACCINE  05/07/2018  . COLONOSCOPY  08/03/2019  . PNA vac Low Risk Adult  Completed   Fall Risk  05/19/2017  Falls in the past year? No   Functional Status Survey:    Vitals:   03/23/18 1203  BP: 128/71  Pulse: 74  Resp: 20  Temp: 98.5 F (36.9 C)  SpO2: 97%  Weight: 215 lb (97.5 kg)  Height: 6' 0.5" (1.842 m)   Body mass index is 28.76 kg/m.   Wt Readings from Last 3 Encounters:  03/23/18 215 lb (97.5 kg)  02/18/18 198 lb (89.8 kg)  01/22/18 205 lb (93 kg)   Physical Exam  Constitutional: He is oriented to person, place, and time. No distress.  Frail, elderly male  HENT:  Head: Normocephalic and atraumatic.  Nose: Nose normal.  Mouth/Throat: Oropharynx is clear and moist. No oropharyngeal exudate.  Eyes: Pupils are equal, round, and reactive to light. Conjunctivae and EOM are normal. Right eye exhibits no discharge. Left eye  exhibits no discharge.  Neck: Normal range of motion. Neck supple.  No cervical spine tenderness  Cardiovascular: Normal rate and regular rhythm.  Pulmonary/Chest: Effort normal.  Poor air movement, no wheeze or rales. Has shortness of breath in between sentences  Abdominal: Soft. Bowel sounds are normal. He exhibits distension. There is no tenderness. There is no rebound and no guarding.  Musculoskeletal: He exhibits edema.  2+ edema, able to move all 4 extremities, unsteady gait, has walker for ambulation  Lymphadenopathy:    He has no cervical adenopathy.  Neurological: He is alert and oriented to person, place, and time. He exhibits normal muscle tone.  Skin: Skin is warm and dry. He is not diaphoretic.  Psychiatric: He has a normal mood and affect.    Labs reviewed: Recent Labs    03/31/17  04/13/17 05/08/17 10/30/17  NA 136*   < > 137  137  137 137 134*  134  K 4.6   < > 4.9  4.9  4.9 5.0 3.9  3.9  CL  --   --   --   --  107  CO2  --   --   --   --  21  BUN 43*   < > 37*  37*  37*  37* 41* 30*  CREATININE 1.9*   < > 1.6*  1.6*  1.6* 1.5* 1.1  1.05  CALCIUM 8.2  --   --   --  8.0  8.0   < > = values in this interval not displayed.   Recent Labs    03/31/17 04/07/17 10/30/17  AST 21 26 40  40  ALT 21 27 36  36  ALKPHOS 89 94 143*  143  BILITOT  --   --  0.8  PROT 5.3  --  5.2  5.2  ALBUMIN 2.9  --  2.4  2.4   Recent Labs    03/27/17 03/31/17 10/30/17  WBC 2.7 2.0 3.3  3.3  HGB 10.8* 10.4* 9.6*  9.6  HCT 31* 30* 28*  28.0  PLT 36* 37* 66*   Lab Results  Component Value Date   TSH 2.60 01/08/2018   Lab Results  Component Value Date   HGBA1C 5.2 12/20/2016   No results found for: CHOL, HDL, LDLCALC, LDLDIRECT, TRIG, CHOLHDL  Significant Diagnostic Results in last 30 days:  No results found.  Assessment/Plan  1. Essential hypertension Controlled BP. Continue furosemide and spironolactone for now.   2. Liver cirrhosis secondary to NASH  ([place]) Continue rifaxamin 550 mg bid. Currently on lasix ad spironolactone. Change lasix to 40 mg bid x 5 days then 40 mg daily, continue spironolactone. Having 3-4 bowel movement a day. Continue lactulose 45 ml tid. See below.   3. Acquired hypothyroidism Lab Results  Component Value Date   TSH 2.60 01/08/2018   Continue levothyroxine 25 mcg daily  4. CKD (chronic kidney disease) stage 4, GFR 15-29 ml/min (HCC) Monitor BMP periodically.   5. 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 No bleed reported. Monitor clinically  6. Major depression, recurrent, chronic (HCC) Stable mood. Decrease sertraline to 12.5 mg daily in attempt for GDR and monitor  7. Other ascites Will obtain therapeutic paracentesis with him under hospice services. Continue lasix with new regimen and spironolactone as above.     Family/ staff Communication: reviewed care plan with patient and charge nurse.    Labs/tests ordered:  CBC, CMP 03/26/18   Blanchie Serve, MD Internal Medicine [facility] [address] [place], [place] [address] Cell Phone (Monday-Friday 8 am - 5 pm): [phone] On Call: [phone] and follow prompts after 5 pm and on weekends Office Phone: [phone] Office Fax: [phone]

## 2018-03-26 ENCOUNTER — Other Ambulatory Visit: Payer: Self-pay | Admitting: Internal Medicine

## 2018-03-26 DIAGNOSIS — R188 Other ascites: Secondary | ICD-10-CM

## 2018-03-26 LAB — BASIC METABOLIC PANEL
BUN: 28 — AB (ref 4–21)
Creatinine: 1.3 (ref 0.6–1.3)
GLUCOSE: 207
Potassium: 3.6 (ref 3.4–5.3)
Sodium: 134 — AB (ref 137–147)

## 2018-03-26 LAB — CBC AND DIFFERENTIAL
HEMATOCRIT: 21 — AB (ref 41–53)
Hemoglobin: 7.6 — AB (ref 13.5–17.5)
Platelets: 80 — AB (ref 150–399)
WBC: 5.2

## 2018-03-26 LAB — HEPATIC FUNCTION PANEL
ALT: 23 (ref 10–40)
AST: 27 (ref 14–40)
Alkaline Phosphatase: 149 — AB (ref 25–125)
BILIRUBIN, TOTAL: 2

## 2018-03-30 ENCOUNTER — Ambulatory Visit (HOSPITAL_COMMUNITY)
Admission: RE | Admit: 2018-03-30 | Discharge: 2018-03-30 | Disposition: A | Discharge status: Home / Self Care | Source: Ambulatory Visit | Attending: Internal Medicine | Admitting: Internal Medicine

## 2018-03-30 ENCOUNTER — Encounter (HOSPITAL_COMMUNITY): Payer: Self-pay | Admitting: Student

## 2018-03-30 DIAGNOSIS — R188 Other ascites: Secondary | ICD-10-CM | POA: Diagnosis not present

## 2018-03-30 HISTORY — PX: IR PARACENTESIS: IMG2679

## 2018-03-30 LAB — CMP 10231
Albumin: 2.2
CHLORIDE: 109
Calcium: 7.3
Carbon Dioxide, Total: 19
EGFR (Non-African Amer.): 57
Globulin: 2.8
Total Protein: 5

## 2018-03-30 MED ORDER — LIDOCAINE HCL (PF) 2 % IJ SOLN
INTRAMUSCULAR | Status: AC
  Filled 2018-03-30: qty 20

## 2018-03-30 MED ORDER — LIDOCAINE HCL 2 % IJ SOLN
INTRAMUSCULAR | Status: DC | PRN
  Administered 2018-03-30: 10 mL

## 2018-03-30 NOTE — Procedures (Signed)
PROCEDURE SUMMARY:  Successful US guided paracentesis from right lateral abdomen.  Yielded 15.0 liters of yellow, clear fluid.  No immediate complications.  Pt tolerated well.   Specimen was not sent for labs.  Docia Barrier PA-C 03/30/2018 2:53 PM

## 2018-04-01 ENCOUNTER — Non-Acute Institutional Stay (SKILLED_NURSING_FACILITY): Payer: Medicare Other | Admitting: Internal Medicine

## 2018-04-01 ENCOUNTER — Encounter: Payer: Self-pay | Admitting: Internal Medicine

## 2018-04-01 DIAGNOSIS — R188 Other ascites: Secondary | ICD-10-CM

## 2018-04-01 DIAGNOSIS — K7581 Nonalcoholic steatohepatitis (NASH): Secondary | ICD-10-CM

## 2018-04-01 DIAGNOSIS — N183 Chronic kidney disease, stage 3 unspecified: Secondary | ICD-10-CM | POA: Insufficient documentation

## 2018-04-01 DIAGNOSIS — K7460 Unspecified cirrhosis of liver: Secondary | ICD-10-CM

## 2018-04-01 DIAGNOSIS — D638 Anemia in other chronic diseases classified elsewhere: Secondary | ICD-10-CM

## 2018-04-01 NOTE — Progress Notes (Signed)
Location:  [facility] Room Number: 35 Place of Service:  SNF [phone]) Provider:  Blanchie Serve, MD  Blanchie Serve, MD  Patient Care Team: Blanchie Serve, MD as PCP - General (Internal Medicine) Ngetich, Nelda Bucks, NP as Nurse Practitioner (Family Medicine)  Extended Emergency Contact Information Primary Emergency Contact: Suszanne Conners Address: [address]          [place], [place] [address] [place] Phone: [phone] Relation: Spouse  Code Status:  DNR  Goals of care: Advanced Directive information Advanced Directives 04/01/2018  Does Patient Have a Medical Advance Directive? Yes  Type of Advance Directive Out of facility DNR (pink MOST or yellow form);Healthcare Power of Attorney  Does patient want to make changes to medical advance directive? No - Patient declined  Copy of [place] in Chart? Yes  Would patient like information on creating a medical advance directive? -  Pre-existing out of facility DNR order (yellow form or pink MOST form) Yellow form placed in chart (order not valid for inpatient use);Pink MOST form placed in chart (order not valid for inpatient use)     Chief Complaint  Patient presents with  . Acute Visit    Abnormal labs    HPI:  Pt is a 72 y.o. male seen today for an acute visit for abnormal labs. He is seen in his room with his wife present. He has NASH with liver cirrhosis with ascites. He had been retaining fluid and his ascites had worsened along with leg edema. He finnal had his therapeutic paracentesis with 15 liters of fluid removed yesterday. He is aao x3. His breathing has improved. He continues to have swelling to his legs. Has poor appetite. Per wife, only taking broth for lunch and dinner with some cracker. Per nursing, takes boost supplement.    Past Medical History:  Diagnosis Date  . Abdominal hernia   . Ascites   . Benign neoplasm of colon   . Cirrhosis ([place])   .  Diabetes mellitus, type 2 ([place])    resolved with gastric bypass  . Diverticulosis of colon (without mention of hemorrhage)   . Esophageal varices ([place])   . Hyperlipemia   . Hypertension   . Iron deficiency anemia secondary to blood loss (chronic)   . Kidney stones   . Liver cyst   . Lymphopenia   . NASH (nonalcoholic steatohepatitis)   . Osteoarthritis of hip   . Renal cyst   . Splenomegaly   . Unspecified sleep apnea    Resolved   Past Surgical History:  Procedure Laterality Date  . ANKLE SURGERY     Bil  . COLONOSCOPY  2012  . GASTRIC BYPASS  2005  . IR PARACENTESIS  09/29/2017  . IR PARACENTESIS  01/22/2018  . IR PARACENTESIS  02/17/2018  . IR PARACENTESIS  03/30/2018  . KNEE SURGERY     Bil  . POLYPECTOMY    . TOTAL HIP ARTHROPLASTY  2008   right    Allergies  Allergen Reactions  . Oxycodone Other (See Comments)    Reaction:  Caused pt to fall     Outpatient Encounter Medications as of 04/01/2018  Medication Sig  . acetaminophen (TYLENOL) 325 MG tablet Take 650 mg by mouth every 4 (four) hours as needed.  . diphenoxylate-atropine (LOMOTIL) 2.5-0.025 MG tablet Take 1 tablet by mouth 4 (four) times daily as needed for diarrhea or loose stools.  . furosemide (LASIX) 40 MG  tablet Take 40 mg by mouth daily.  Marland Kitchen lactulose (CHRONULAC) 10 GM/15ML solution Take 10 g 3 (three) times daily by mouth. Take 45 mL. Hold for >3 bowel movements per day  . levothyroxine (SYNTHROID, LEVOTHROID) 25 MCG tablet Take 25 mcg by mouth daily before breakfast.  . Nutritional Supplements (BOOST BREEZE PO) Take 1 Container by mouth 3 (three) times daily between meals.   . rifaximin (XIFAXAN) 550 MG TABS tablet Take 550 mg by mouth 2 (two) times daily.  . sertraline (ZOLOFT) 25 MG tablet Take 12.5 mg by mouth at bedtime.  Marland Kitchen spironolactone (ALDACTONE) 50 MG tablet Take 50 mg by mouth daily.  . [DISCONTINUED] Sertraline HCl (ZOLOFT PO) Take 25 mg by mouth at bedtime.    No facility-administered  encounter medications on file as of 04/01/2018.     Review of Systems  Constitutional: Positive for appetite change and fatigue. Negative for chills and fever.  HENT: Negative for congestion, mouth sores and trouble swallowing.   Respiratory: Negative for cough and shortness of breath.   Cardiovascular: Negative for chest pain and palpitations.  Gastrointestinal: Negative for abdominal pain, constipation, diarrhea, nausea and vomiting.       Moving bowel 3-4 times a day  Genitourinary: Negative for dysuria.  Musculoskeletal: Positive for gait problem.  Neurological: Negative for light-headedness and headaches.    Immunization History  Administered Date(s) Administered  . Influenza-Unspecified 07/17/2017   Pertinent  Health Maintenance Due  Topic Date Due  . FOOT EXAM  12/29/1955  . OPHTHALMOLOGY EXAM  [birth year]29/1955  . URINE MICROALBUMIN  [birth year]29/1956  . HEMOGLOBIN A1C  06/22/2017  . INFLUENZA VACCINE  05/07/2018  . COLONOSCOPY  08/03/2019  . PNA vac Low Risk Adult  Completed   Fall Risk  05/19/2017  Falls in the past year? No   Functional Status Survey:    Vitals:   04/01/18 1556  BP: (!) 117/58  Pulse: 83  Resp: 16  Temp: 98.6 F (37 C)  TempSrc: Oral  SpO2: 96%  Weight: 192 lb (87.1 kg)  Height: 6' 0.5" (1.842 m)   Body mass index is 25.68 kg/m.   Wt Readings from Last 3 Encounters:  04/01/18 192 lb (87.1 kg)  03/23/18 215 lb (97.5 kg)  02/18/18 198 lb (89.8 kg)   Physical Exam  Constitutional:  Frail elderly male in NAD  HENT:  Head: Normocephalic and atraumatic.  Mouth/Throat: Oropharynx is clear and moist.  Eyes: Pupils are equal, round, and reactive to light. Conjunctivae and EOM are normal.  Neck: Neck supple.  Cardiovascular: Normal rate and regular rhythm.  Pulmonary/Chest: Effort normal. No respiratory distress. He has no wheezes. He has no rales.  POOR AIR MOVEMENT  Abdominal: Soft. Bowel sounds are normal. He exhibits distension. There is no  tenderness. There is no guarding.  Musculoskeletal: He exhibits edema.  1+ pitting leg edema  Neurological: He is alert.  Skin: Skin is warm and dry. He is not diaphoretic.  Psychiatric: He has a normal mood and affect.    Labs reviewed: Recent Labs    05/08/17 10/30/17 03/26/18  NA 137 134*  134 134*  K 5.0 3.9  3.9 3.6  CL  --  107 109  CO2  --  21 19  BUN 41* 30* 28*  CREATININE 1.5* 1.1  1.05 1.3  CALCIUM  --  8.0  8.0 7.3   Recent Labs    10/30/17 03/26/18  AST 40  40 27  ALT 36  36 23  ALKPHOS 143*  143 149*  BILITOT 0.8  --   PROT 5.2  5.2 5.0  ALBUMIN 2.4  2.4 2.2   Recent Labs    10/30/17 03/26/18  WBC 3.3  3.3 5.2  HGB 9.6*  9.6 7.6*  HCT 28*  28.0 21*  PLT 66* 80*   Lab Results  Component Value Date   TSH 2.60 01/08/2018   Lab Results  Component Value Date   HGBA1C 5.2 12/20/2016   No results found for: CHOL, HDL, LDLCALC, LDLDIRECT, TRIG, CHOLHDL  Significant Diagnostic Results in last 30 days:  Ir Paracentesis  Result Date: 03/30/2018 INDICATION: Patient with recurrent ascites. Request is made for therapeutic paracentesis. EXAM: ULTRASOUND GUIDED THERAPEUTIC PARACENTESIS MEDICATIONS: 10 mL 2% lidocaine COMPLICATIONS: None immediate. PROCEDURE: Informed written consent was obtained from the patient after a discussion of the risks, benefits and alternatives to treatment. A timeout was performed prior to the initiation of the procedure. Initial ultrasound scanning demonstrates a large amount of ascites within the right lower abdominal quadrant. The right lower abdomen was prepped and draped in the usual sterile fashion. 2% lidocaine was used for local anesthesia. Following this, a 19 gauge, 7-cm, Yueh catheter was introduced. An ultrasound image was saved for documentation purposes. The paracentesis was performed. The catheter was removed and a dressing was applied. The patient tolerated the procedure well without immediate post procedural  complication. FINDINGS: A total of approximately 15.0 liters of clear, yellow fluid was removed. IMPRESSION: Successful ultrasound-guided therapeutic paracentesis yielding 15.0 liters of peritoneal fluid. Read by: Brynda Greathouse PA-C Electronically Signed   By: Corrie Mckusick D.O.   On: 03/30/2018 15:20    Assessment/Plan  1. Liver cirrhosis secondary to NASH ([place]) Continue rifaxamin and lactulose. Continue diuretic with new dosing as below. Goal of care is for comfort, followed by hospice team as well.   2. Other ascites S/p 15 liters fluid removal yesterday. To prevent re- accumulation, increase spironolactone to 100 mg daily and continue furosemide 40 mg daily  3. CKD (chronic kidney disease) stage 3, GFR 30-59 ml/min (HCC) Reviewed renal function. His lasix was recently increased to 40 mg bid and is now back on 40 mg daily. Monitor bmp periodically. Good urine output  4. Anemia of chronic disease Low Hb with drop from 9.6 to 7.6. Denies bleed. Has NASH with liver cirrhosis with low Hb and plt count. Also has history of iron deficiency. Add ferrous sulfate 325 mg daily to help with anemia to prevent symptoms fo dyspnea from anemia and fluid overload. Cbc in 4 weeks.     Family/ staff Communication: reviewed care plan with patient, his wife and charge nurse.    Labs/tests ordered:  Cbc, bmp in 4 weeks  Blanchie Serve, MD Internal Medicine [place], [place] [address] Cell Phone (Monday-Friday 8 am - 5 pm): [phone] On Call: [phone] and follow prompts after 5 pm and on weekends Office Phone: [phone] Office Fax: [phone] Dietary consult  Increase spironolactone to 100 mg daily and change lasix to 40 mg daily

## 2018-04-06 ENCOUNTER — Non-Acute Institutional Stay (SKILLED_NURSING_FACILITY): Payer: Medicare Other | Admitting: Internal Medicine

## 2018-04-06 ENCOUNTER — Encounter: Payer: Self-pay | Admitting: Internal Medicine

## 2018-04-06 DIAGNOSIS — R635 Abnormal weight gain: Secondary | ICD-10-CM

## 2018-04-06 DIAGNOSIS — E43 Unspecified severe protein-calorie malnutrition: Secondary | ICD-10-CM | POA: Diagnosis not present

## 2018-04-06 DIAGNOSIS — K7460 Unspecified cirrhosis of liver: Secondary | ICD-10-CM | POA: Diagnosis not present

## 2018-04-06 DIAGNOSIS — R609 Edema, unspecified: Secondary | ICD-10-CM | POA: Diagnosis not present

## 2018-04-06 DIAGNOSIS — R188 Other ascites: Secondary | ICD-10-CM

## 2018-04-06 NOTE — Progress Notes (Signed)
Location:  [facility] Room Number: 35 Place of Service:  SNF [phone]) Provider:  Blanchie Serve, MD  Blanchie Serve, MD  Patient Care Team: Blanchie Serve, MD as PCP - General (Internal Medicine) Ngetich, Nelda Bucks, NP as Nurse Practitioner (Family Medicine)  Extended Emergency Contact Information Primary Emergency Contact: Suszanne Conners Address: [address]          [place], [place] [address] [place] Phone: [phone] Relation: Spouse  Code Status:  DNR  Goals of care: Advanced Directive information Advanced Directives 04/06/2018  Does Patient Have a Medical Advance Directive? Yes  Type of Paramedic of [place];Out of facility DNR (pink MOST or yellow form)  Does patient want to make changes to medical advance directive? No - Patient declined  Copy of [place] in Chart? Yes  Would patient like information on creating a medical advance directive? -  Pre-existing out of facility DNR order (yellow form or pink MOST form) Yellow form placed in chart (order not valid for inpatient use);Pink MOST form placed in chart (order not valid for inpatient use)     Chief Complaint  Patient presents with  . Acute Visit    Weight gain    HPI:  Pt is a 72 y.o. male seen today for an acute visit for weight gain. He has NASH with liver cirrhosis with ascites. He recently underwent therapeutic paracentesis and has 15 liters of fluid drained on 03/31/18. He is seen in his room today with hospice nurse present. He denies any dyspnea. He gets around in his room with his walker and wheelchair. On chart review, he weighed 179.4 lb on arrival after paracentesis and then has gained weight to 192 and 199 lb.   Past Medical History:  Diagnosis Date  . Abdominal hernia   . Ascites   . Benign neoplasm of colon   . Cirrhosis ([place])   . Diabetes mellitus, type 2 ([place])    resolved with gastric bypass  . Diverticulosis  of colon (without mention of hemorrhage)   . Esophageal varices ([place])   . Hyperlipemia   . Hypertension   . Iron deficiency anemia secondary to blood loss (chronic)   . Kidney stones   . Liver cyst   . Lymphopenia   . NASH (nonalcoholic steatohepatitis)   . Osteoarthritis of hip   . Renal cyst   . Splenomegaly   . Unspecified sleep apnea    Resolved   Past Surgical History:  Procedure Laterality Date  . ANKLE SURGERY     Bil  . COLONOSCOPY  2012  . GASTRIC BYPASS  2005  . IR PARACENTESIS  09/29/2017  . IR PARACENTESIS  01/22/2018  . IR PARACENTESIS  02/17/2018  . IR PARACENTESIS  03/30/2018  . KNEE SURGERY     Bil  . POLYPECTOMY    . TOTAL HIP ARTHROPLASTY  2008   right    Allergies  Allergen Reactions  . Oxycodone Other (See Comments)    Reaction:  Caused pt to fall     Outpatient Encounter Medications as of 04/06/2018  Medication Sig  . acetaminophen (TYLENOL) 325 MG tablet Take 650 mg by mouth every 4 (four) hours as needed.  . diphenoxylate-atropine (LOMOTIL) 2.5-0.025 MG tablet Take 1 tablet by mouth 4 (four) times daily as needed for diarrhea or loose stools.  . ferrous sulfate 325 (65 FE) MG tablet Take 325 mg by mouth daily with breakfast.  . furosemide (  LASIX) 40 MG tablet Take 40 mg by mouth daily.  Marland Kitchen lactulose (CHRONULAC) 10 GM/15ML solution Take 10 g 3 (three) times daily by mouth. Take 45 mL. Hold for >3 bowel movements per day  . levothyroxine (SYNTHROID, LEVOTHROID) 25 MCG tablet Take 25 mcg by mouth daily before breakfast.  . Nutritional Supplements (BOOST BREEZE PO) Take 1 Container by mouth 3 (three) times daily between meals.   . rifaximin (XIFAXAN) 550 MG TABS tablet Take 550 mg by mouth 2 (two) times daily.  . sertraline (ZOLOFT) 25 MG tablet Take 12.5 mg by mouth at bedtime.  Marland Kitchen spironolactone (ALDACTONE) 100 MG tablet Take 100 mg by mouth daily.  . [DISCONTINUED] spironolactone (ALDACTONE) 50 MG tablet Take 50 mg by mouth daily.   No  facility-administered encounter medications on file as of 04/06/2018.     Review of Systems  Constitutional: Negative for appetite change, chills, fatigue and fever.  HENT: Negative for congestion.   Respiratory: Negative for shortness of breath and wheezing.   Cardiovascular: Positive for leg swelling. Negative for chest pain and palpitations.  Gastrointestinal: Negative for abdominal pain, diarrhea, nausea and vomiting.       Has 2-3 bowel movement in a day  Musculoskeletal: Positive for gait problem.  Neurological: Negative for dizziness and headaches.    Immunization History  Administered Date(s) Administered  . Influenza-Unspecified 07/17/2017   Pertinent  Health Maintenance Due  Topic Date Due  . FOOT EXAM  12/29/1955  . OPHTHALMOLOGY EXAM  12/29/1955  . URINE MICROALBUMIN  [birth year]29/1955  . HEMOGLOBIN A1C  06/22/2017  . INFLUENZA VACCINE  05/07/2018  . COLONOSCOPY  08/03/2019  . PNA vac Low Risk Adult  Completed   Fall Risk  05/19/2017  Falls in the past year? No   Functional Status Survey:    Vitals:   04/06/18 1025  BP: (!) 117/58  Pulse: 83  Resp: 16  Temp: 98.6 F (37 C)  TempSrc: Oral  SpO2: 96%  Weight: 197 lb 9.6 oz (89.6 kg)  Height: 6' 0.5" (1.842 m)   Body mass index is 26.43 kg/m.   Wt Readings from Last 3 Encounters:  04/06/18 197 lb 9.6 oz (89.6 kg)  04/01/18 192 lb (87.1 kg)  03/23/18 215 lb (97.5 kg)   Physical Exam  Constitutional: He is oriented to person, place, and time.  Frail elderly male in no acute distress  HENT:  Head: Normocephalic and atraumatic.  Mouth/Throat: Oropharynx is clear and moist. No oropharyngeal exudate.  Eyes: Conjunctivae are normal. Right eye exhibits no discharge. Left eye exhibits no discharge.  Neck: Neck supple.  Cardiovascular: Normal rate and regular rhythm.  Pulmonary/Chest: Effort normal and breath sounds normal. No respiratory distress. He has no wheezes. He has no rales.  Abdominal: Soft. Bowel sounds  are normal. He exhibits distension. There is no tenderness. There is no rebound and no guarding.  Musculoskeletal:  1+ edema, can move all 4 extremities, uses walker and wheelchair for ambulation  Lymphadenopathy:    He has no cervical adenopathy.  Neurological: He is alert and oriented to person, place, and time. He exhibits normal muscle tone.  Skin: Skin is warm and dry. Capillary refill takes less than 2 seconds. He is not diaphoretic.  Psychiatric: He has a normal mood and affect.    Labs reviewed: Recent Labs    05/08/17 10/30/17 03/26/18  NA 137 134*  134 134*  K 5.0 3.9  3.9 3.6  CL  --  107 109  CO2  --  21 19  BUN 41* 30* 28*  CREATININE 1.5* 1.1  1.05 1.3  CALCIUM  --  8.0  8.0 7.3   Recent Labs    10/30/17 03/26/18  AST 40  40 27  ALT 36  36 23  ALKPHOS 143*  143 149*  BILITOT 0.8  --   PROT 5.2  5.2 5.0  ALBUMIN 2.4  2.4 2.2   Recent Labs    10/30/17 03/26/18  WBC 3.3  3.3 5.2  HGB 9.6*  9.6 7.6*  HCT 28*  28.0 21*  PLT 66* 80*   Lab Results  Component Value Date   TSH 2.60 01/08/2018   Lab Results  Component Value Date   HGBA1C 5.2 12/20/2016   No results found for: CHOL, HDL, LDLCALC, LDLDIRECT, TRIG, CHOLHDL  Significant Diagnostic Results in last 30 days:  Ir Paracentesis  Result Date: 03/30/2018 INDICATION: Patient with recurrent ascites. Request is made for therapeutic paracentesis. EXAM: ULTRASOUND GUIDED THERAPEUTIC PARACENTESIS MEDICATIONS: 10 mL 2% lidocaine COMPLICATIONS: None immediate. PROCEDURE: Informed written consent was obtained from the patient after a discussion of the risks, benefits and alternatives to treatment. A timeout was performed prior to the initiation of the procedure. Initial ultrasound scanning demonstrates a large amount of ascites within the right lower abdominal quadrant. The right lower abdomen was prepped and draped in the usual sterile fashion. 2% lidocaine was used for local anesthesia. Following this,  a 19 gauge, 7-cm, Yueh catheter was introduced. An ultrasound image was saved for documentation purposes. The paracentesis was performed. The catheter was removed and a dressing was applied. The patient tolerated the procedure well without immediate post procedural complication. FINDINGS: A total of approximately 15.0 liters of clear, yellow fluid was removed. IMPRESSION: Successful ultrasound-guided therapeutic paracentesis yielding 15.0 liters of peritoneal fluid. Read by: Brynda Greathouse PA-C Electronically Signed   By: Corrie Mckusick D.O.   On: 03/30/2018 15:20    Assessment/Plan  1. Weight gain with edema Likely a combination of ascites with liver cirrhosis and severe protein calorie malnutrition. Has been receiving therapeutic paracentesis. Will adjust dosing of diuretic to help diurese further. Goal of care is for supportive care. Changes to diuretic as below.   2. Cirrhosis of liver with ascites, unspecified hepatic cirrhosis type ([place]) With NASH. Continue rifaxamin current regimen. D/c furosemide with worsening kidney function. Start torsemide 20 mg bid in place of furosemide 40 mg daily. Also add metolazone 2.5 mg on Monday, Wednesday and Friday with torsemide and monitor diuresis. Patient to be on spironolactone 100 mg daily - the dose was increased from 50 mg daily on 04/01/18. On chart review, patient has not been getting this medication for unclear reason. Continue spironolactone 100 mg daily. On chart review patient has not been receiving it. Monitor weight M/W/F. Hospice services to follow. Add kcl 20 meq bid. Check CMP in 1 week  3. Severe protein-calorie malnutrition (HCC) Poor po intake. Continue beef broth and cracker with nutritional supplement. Dietary team to provide pt with requested meal, encourage oral intake as tolerated.     Family/ staff Communication: reviewed care plan with patient and charge nurse.    Labs/tests ordered:  CMP with eGFR in 1 week.   Blanchie Serve,  MD Internal Medicine [facility] [address] [place], [place] [address] Cell Phone (Monday-Friday 8 am - 5 pm): [phone] On Call: [phone] and follow prompts after 5 pm and on weekends Office Phone: [phone] Office Fax: [phone]

## 2018-04-14 LAB — COMPLETE METABOLIC PANEL WITH GFR
ALT: 19
AST: 23
Albumin: 2.4
Alkaline Phosphatase: 163
BUN: 42 — AB (ref 4–21)
CREATININE: 1.75
Calcium: 7.5
EGFR (Non-African Amer.): 38
Glucose: 255
Potassium: 4.4
Sodium: 129
TOTAL PROTEIN: 5.2 g/dL
Total Bilirubin: 2.4

## 2018-04-15 ENCOUNTER — Encounter: Payer: Self-pay | Admitting: *Deleted

## 2018-04-21 ENCOUNTER — Non-Acute Institutional Stay (SKILLED_NURSING_FACILITY): Payer: Medicare Other | Admitting: Family

## 2018-04-21 ENCOUNTER — Encounter: Payer: Self-pay | Admitting: Family

## 2018-04-21 DIAGNOSIS — E039 Hypothyroidism, unspecified: Secondary | ICD-10-CM | POA: Diagnosis not present

## 2018-04-21 DIAGNOSIS — N184 Chronic kidney disease, stage 4 (severe): Secondary | ICD-10-CM

## 2018-04-21 DIAGNOSIS — K7581 Nonalcoholic steatohepatitis (NASH): Secondary | ICD-10-CM | POA: Diagnosis not present

## 2018-04-21 DIAGNOSIS — D638 Anemia in other chronic diseases classified elsewhere: Secondary | ICD-10-CM | POA: Diagnosis not present

## 2018-04-21 DIAGNOSIS — K7460 Unspecified cirrhosis of liver: Secondary | ICD-10-CM

## 2018-04-21 NOTE — Progress Notes (Signed)
Location:  [facility] Room Number: 35 Place of Service:  SNF (31) Provider: Dinah Ngetich FNP-C   Blanchie Serve, MD  Patient Care Team: Blanchie Serve, MD as PCP - General (Internal Medicine) Ngetich, Nelda Bucks, NP as Nurse Practitioner (Family Medicine)  Extended Emergency Contact Information Primary Emergency Contact: Suszanne Conners Address: [address]          [place], [place] [address] [place] Phone: [phone] Relation: Spouse  Code Status:  DNR Goals of care: Advanced Directive information Advanced Directives 04/21/2018  Does Patient Have a Medical Advance Directive? Yes  Type of Paramedic of [place];Out of facility DNR (pink MOST or yellow form)  Does patient want to make changes to medical advance directive? -  Copy of [place] in Chart? Yes  Would patient like information on creating a medical advance directive? -  Pre-existing out of facility DNR order (yellow form or pink MOST form) Yellow form placed in chart (order not valid for inpatient use);Pink MOST form placed in chart (order not valid for inpatient use)     Chief Complaint  Patient presents with  . Medical Management of Chronic Issues    HPI:  Pt is a 72 y.o. male seen today [place] for medical management of chronic diseases.He has a medical history of HTN,CAD,liver cirrhosis secondary to NASH,hypothyroidism,Depression,Anemia among other conditions.He is seen in his room today.He denies any acute issues during visit.He has had no recent fall episode and no weight changes.He states no shortness of breath.Facility Nurse states no new acute concerns.He continues to follow up with Hospice service.     Past Medical History:  Diagnosis Date  . Abdominal hernia   . Ascites   . Benign neoplasm of colon   . Cirrhosis ([place])   . Diabetes mellitus, type 2 ([place])    resolved with gastric bypass  . Diverticulosis  of colon (without mention of hemorrhage)   . Esophageal varices ([place])   . Hyperlipemia   . Hypertension   . Iron deficiency anemia secondary to blood loss (chronic)   . Kidney stones   . Liver cyst   . Lymphopenia   . NASH (nonalcoholic steatohepatitis)   . Osteoarthritis of hip   . Renal cyst   . Splenomegaly   . Unspecified sleep apnea    Resolved   Past Surgical History:  Procedure Laterality Date  . ANKLE SURGERY     Bil  . COLONOSCOPY  2012  . GASTRIC BYPASS  2005  . IR PARACENTESIS  09/29/2017  . IR PARACENTESIS  01/22/2018  . IR PARACENTESIS  02/17/2018  . IR PARACENTESIS  03/30/2018  . KNEE SURGERY     Bil  . POLYPECTOMY    . TOTAL HIP ARTHROPLASTY  2008   right    Allergies  Allergen Reactions  . Oxycodone Other (See Comments)    Reaction:  Caused pt to fall     Allergies as of 04/21/2018      Reactions   Oxycodone Other (See Comments)   Reaction:  Caused pt to fall       Medication List        Accurate as of 04/21/18  6:05 PM. Always use your most recent med list.          acetaminophen 325 MG tablet Commonly known as:  TYLENOL Take 650 mg by mouth every 4 (four) hours as needed.   BOOST BREEZE PO Take  1 Container by mouth 3 (three) times daily between meals.   diphenoxylate-atropine 2.5-0.025 MG tablet Commonly known as:  LOMOTIL Take 1 tablet by mouth 4 (four) times daily as needed for diarrhea or loose stools.   ferrous sulfate 325 (65 FE) MG tablet Take 325 mg by mouth daily with breakfast.   lactulose 10 GM/15ML solution Commonly known as:  CHRONULAC Take 10 g 3 (three) times daily by mouth. Take 45 mL. Hold for >3 bowel movements per day   levothyroxine 25 MCG tablet Commonly known as:  SYNTHROID, LEVOTHROID Take 25 mcg by mouth daily before breakfast.   metolazone 2.5 MG tablet Commonly known as:  ZAROXOLYN Take 2.5 mg by mouth 3 (three) times a week. M,W,F with morning dose of Torsemide   potassium chloride SA 20 MEQ  tablet Commonly known as:  K-DUR,KLOR-CON Take 20 mEq by mouth 2 (two) times daily.   rifaximin 550 MG Tabs tablet Commonly known as:  XIFAXAN Take 550 mg by mouth 2 (two) times daily.   sertraline 25 MG tablet Commonly known as:  ZOLOFT Take 25 mg by mouth at bedtime.   spironolactone 100 MG tablet Commonly known as:  ALDACTONE Take 100 mg by mouth daily. Hold if SBP <100   torsemide 20 MG tablet Commonly known as:  DEMADEX Take 20 mg by mouth 2 (two) times daily. Hold if SBP <110       Review of Systems  Constitutional: Negative for chills, fatigue and fever.  HENT: Negative for congestion, rhinorrhea, sinus pressure, sinus pain, sneezing and sore throat.   Eyes: Negative for discharge, redness, itching and visual disturbance.  Respiratory: Negative for cough, chest tightness, shortness of breath and wheezing.   Cardiovascular: Positive for leg swelling. Negative for chest pain and palpitations.  Gastrointestinal: Positive for abdominal distention. Negative for abdominal pain, constipation, diarrhea, nausea and vomiting.  Endocrine: Negative for cold intolerance, heat intolerance, polydipsia, polyphagia and polyuria.  Genitourinary: Negative for dysuria, flank pain, frequency and urgency.  Musculoskeletal: Positive for gait problem.  Skin: Negative for color change, pallor, rash and wound.  Neurological: Negative for dizziness, light-headedness and headaches.  Hematological: Does not bruise/bleed easily.  Psychiatric/Behavioral: Negative for agitation and sleep disturbance. The patient is not nervous/anxious.     Immunization History  Administered Date(s) Administered  . Influenza-Unspecified 07/17/2017   Pertinent  Health Maintenance Due  Topic Date Due  . FOOT EXAM  12/29/1955  . OPHTHALMOLOGY EXAM  [birth year]29/1955  . URINE MICROALBUMIN  [birth year]29/1955  . HEMOGLOBIN A1C  06/22/2017  . INFLUENZA VACCINE  05/07/2018  . COLONOSCOPY  08/03/2019  . PNA vac Low Risk Adult   Completed   Fall Risk  05/19/2017  Falls in the past year? No   Functional Status Survey:    Vitals:   04/21/18 1356  BP: 102/60  Pulse: 89  Resp: 18  Temp: 97.9 F (36.6 C)  SpO2: 96%  Weight: 187 lb (84.8 kg)  Height: 6' 0.5" (1.842 m)   Body mass index is 25.01 kg/m. Physical Exam  Constitutional:  Tall frail elderly in no acute distress   HENT:  Head: Normocephalic.  Right Ear: External ear normal.  Left Ear: External ear normal.  Mouth/Throat: Oropharynx is clear and moist. No oropharyngeal exudate.  Eyes: Pupils are equal, round, and reactive to light. Conjunctivae and EOM are normal. Right eye exhibits no discharge. Left eye exhibits no discharge. No scleral icterus.  Neck: Normal range of motion. No JVD present. No thyromegaly present.  Cardiovascular: Normal rate, regular rhythm, normal heart sounds and intact distal pulses. Exam reveals no gallop and no friction rub.  No murmur heard. Pulmonary/Chest: Effort normal and breath sounds normal. No respiratory distress. He has no wheezes. He has no rales. He exhibits no tenderness.  Abdominal: Soft. Bowel sounds are normal. He exhibits distension. He exhibits no mass. There is no tenderness. There is no rebound and no guarding. A hernia is present. Hernia confirmed positive in the ventral area.  Musculoskeletal: Normal range of motion. He exhibits no tenderness.  Unsteady gait ambulates with FWW.Bilateral lower extremities edema   Lymphadenopathy:    He has no cervical adenopathy.  Neurological: Gait abnormal.  Alert and oriented to person and place   Skin: Skin is warm and dry. No rash noted. No erythema. No pallor.  Psychiatric: He has a normal mood and affect. His speech is normal and behavior is normal. Judgment and thought content normal.  Nursing note and vitals reviewed.   Labs reviewed: Recent Labs    10/30/17 03/26/18 04/14/18  NA 134*  134 134* 129  K 3.9  3.9 3.6 4.4  CL 107 109  --   CO2 21 19  --    BUN 30* 28* 42*  CREATININE 1.1  1.05 1.3 1.75  CALCIUM 8.0  8.0 7.3 7.5   Recent Labs    10/30/17 03/26/18 04/14/18  AST 40  40 27 23  ALT 36  36 23 19  ALKPHOS 143*  143 149* 163  BILITOT 0.8  --  2.4  PROT 5.2  5.2 5.0 5.2  ALBUMIN 2.4  2.4 2.2 2.4   Recent Labs    10/30/17 03/26/18  WBC 3.3  3.3 5.2  HGB 9.6*  9.6 7.6*  HCT 28*  28.0 21*  PLT 66* 80*   Lab Results  Component Value Date   TSH 2.60 01/08/2018   Lab Results  Component Value Date   HGBA1C 5.2 12/20/2016   No results found for: CHOL, HDL, LDLCALC, LDLDIRECT, TRIG, CHOLHDL  Significant Diagnostic Results in last 30 days:  Ir Paracentesis  Result Date: 03/30/2018 INDICATION: Patient with recurrent ascites. Request is made for therapeutic paracentesis. EXAM: ULTRASOUND GUIDED THERAPEUTIC PARACENTESIS MEDICATIONS: 10 mL 2% lidocaine COMPLICATIONS: None immediate. PROCEDURE: Informed written consent was obtained from the patient after a discussion of the risks, benefits and alternatives to treatment. A timeout was performed prior to the initiation of the procedure. Initial ultrasound scanning demonstrates a large amount of ascites within the right lower abdominal quadrant. The right lower abdomen was prepped and draped in the usual sterile fashion. 2% lidocaine was used for local anesthesia. Following this, a 19 gauge, 7-cm, Yueh catheter was introduced. An ultrasound image was saved for documentation purposes. The paracentesis was performed. The catheter was removed and a dressing was applied. The patient tolerated the procedure well without immediate post procedural complication. FINDINGS: A total of approximately 15.0 liters of clear, yellow fluid was removed. IMPRESSION: Successful ultrasound-guided therapeutic paracentesis yielding 15.0 liters of peritoneal fluid. Read by: Brynda Greathouse PA-C Electronically Signed   By: Corrie Mckusick D.O.   On: 03/30/2018 15:20    Assessment/Plan 1. Acquired  hypothyroidism Lab Results  Component Value Date   TSH 2.60 01/08/2018  Continue on Levothyroxine 25 mcg tablet daily.continue to monitor TSH level.   2. Liver cirrhosis secondary to NASH Has 2-3 bowel movement daily.continue on Aldactone 100 mg tablet daily,Torsemide 20 mg tablet twice daily,Zaroxolyn 2.5 mg tablet three time per [facility]  45 ml daily and rifaximin 550 mg tablet twice daily.gets therapeutic paracentesis as needed for worsening ascites. continue to monitor weight and worsening shortness of breath.continue to follow up with Hospice service.    3. Anemia of chronic disease Recent Hgb 7.6 (03/26/2018).started on Ferrous sulfate 325 mg tablet daily daily with breakfast.continue to monitor H/H.  4. CKD (chronic kidney disease) stage 4, GFR 15-29 ml/min Continue to monitor BMP.continue to follow up with Hospice service.   Family/ staff Communication: Reviewed plan of care with patient and facility Nurse.  Labs/tests ordered: None   Dinah C Ngetich, NP

## 2018-04-23 ENCOUNTER — Encounter: Payer: Self-pay | Admitting: *Deleted

## 2018-04-23 LAB — CBC
HCT: 16.1
HGB: 5.8
MCV: 100.6
WBC: 6.9
platelet count: 73

## 2018-04-23 LAB — BASIC METABOLIC PANEL
BUN: 50 — AB (ref 4–21)
CALCIUM: 7.4
CREATININE: 1.85
Glucose: 216
POTASSIUM: 4.4
Sodium: 131

## 2018-05-08 ENCOUNTER — Encounter: Payer: Self-pay | Admitting: Family

## 2018-05-08 ENCOUNTER — Non-Acute Institutional Stay (SKILLED_NURSING_FACILITY): Payer: Medicare Other | Admitting: Family

## 2018-05-08 DIAGNOSIS — K7581 Nonalcoholic steatohepatitis (NASH): Secondary | ICD-10-CM | POA: Diagnosis not present

## 2018-05-08 DIAGNOSIS — K7460 Unspecified cirrhosis of liver: Secondary | ICD-10-CM

## 2018-05-08 DIAGNOSIS — I959 Hypotension, unspecified: Secondary | ICD-10-CM | POA: Diagnosis not present

## 2018-05-08 NOTE — Progress Notes (Signed)
Location:  [facility] Room Number: 35 Place of Service:  SNF (31) Provider: Kashius Dominic FNP-C  Blanchie Serve, MD  Patient Care Team: Blanchie Serve, MD as PCP - General (Internal Medicine) Harlon Kutner, Nelda Bucks, NP as Nurse Practitioner (Family Medicine)  Extended Emergency Contact Information Primary Emergency Contact: Suszanne Conners Address: [address]          [place], [place] [address] [place] Phone: [phone] Relation: Spouse  Code Status:  DNR Goals of care: Advanced Directive information Advanced Directives 05/08/2018  Does Patient Have a Medical Advance Directive? Yes  Type of Paramedic of [place];Out of facility DNR (pink MOST or yellow form)  Does patient want to make changes to medical advance directive? -  Copy of [place] in Chart? Yes  Would patient like information on creating a medical advance directive? -  Pre-existing out of facility DNR order (yellow form or pink MOST form) Yellow form placed in chart (order not valid for inpatient use);Pink MOST form placed in chart (order not valid for inpatient use)     Chief Complaint  Patient presents with  . Acute Visit    BP issues    HPI:  Pt is a 72 y.o. male seen today at [facility] for an acute visit for evaluation of low blood pressure readings.He is seen in his room today with facility Nurse at bedside.Nurse reports patient's systolic blood pressure most readings below 110.his Torsemide 20 mg tablet twice daily has been held several times due to low blood pressure.Patient denies any dizziness,faintness,shortness of breath,cough or edema.blood pressure  reviewed readings ranging in the 90'/50's-135/70's but overall SBP readings less or in the  110's.His weight log reviewed no abrupt weight gain noted.   Past Medical History:  Diagnosis Date  . Abdominal hernia   . Ascites   . Benign neoplasm of colon   .  Cirrhosis ([place])   . Diabetes mellitus, type 2 ([place])    resolved with gastric bypass  . Diverticulosis of colon (without mention of hemorrhage)   . Esophageal varices ([place])   . Hyperlipemia   . Hypertension   . Iron deficiency anemia secondary to blood loss (chronic)   . Kidney stones   . Liver cyst   . Lymphopenia   . NASH (nonalcoholic steatohepatitis)   . Osteoarthritis of hip   . Renal cyst   . Splenomegaly   . Unspecified sleep apnea    Resolved   Past Surgical History:  Procedure Laterality Date  . ANKLE SURGERY     Bil  . COLONOSCOPY  2012  . GASTRIC BYPASS  2005  . IR PARACENTESIS  09/29/2017  . IR PARACENTESIS  01/22/2018  . IR PARACENTESIS  02/17/2018  . IR PARACENTESIS  03/30/2018  . KNEE SURGERY     Bil  . POLYPECTOMY    . TOTAL HIP ARTHROPLASTY  2008   right    Allergies  Allergen Reactions  . Oxycodone Other (See Comments)    Reaction:  Caused pt to fall     Outpatient Encounter Medications as of 05/08/2018  Medication Sig  . acetaminophen (TYLENOL) 325 MG tablet Take 650 mg by mouth every 4 (four) hours as needed.  . diphenoxylate-atropine (LOMOTIL) 2.5-0.025 MG tablet Take 1 tablet by mouth 4 (four) times daily as needed for diarrhea or loose stools.  . ferrous sulfate 325 (65 FE) MG tablet Take 325 mg by mouth daily with breakfast.  .  lactulose (CHRONULAC) 10 GM/15ML solution Take 10 g 3 (three) times daily by mouth. Take 45 mL. Hold for >3 bowel movements per day  . levothyroxine (SYNTHROID, LEVOTHROID) 25 MCG tablet Take 25 mcg by mouth daily before breakfast.  . metolazone (ZAROXOLYN) 2.5 MG tablet Take 2.5 mg by mouth 3 (three) times a week. M,W,F with morning dose of Torsemide  . Nutritional Supplements (BOOST BREEZE PO) Take 1 Container by mouth 3 (three) times daily between meals.   . potassium chloride SA (K-DUR,KLOR-CON) 20 MEQ tablet Take 20 mEq by mouth 2 (two) times daily.  . rifaximin (XIFAXAN) 550 MG TABS tablet Take 550 mg by mouth 2 (two)  times daily.  . sertraline (ZOLOFT) 25 MG tablet Take 25 mg by mouth at bedtime.   Marland Kitchen spironolactone (ALDACTONE) 100 MG tablet Take 100 mg by mouth daily. Hold if SBP <100  . torsemide (DEMADEX) 20 MG tablet Take 20 mg by mouth 2 (two) times daily. Hold if SBP <110   No facility-administered encounter medications on file as of 05/08/2018.     Review of Systems  Reason unable to perform ROS: additional information provided by facility Nurse   Constitutional: Negative for chills, fatigue, fever and unexpected weight change.  Respiratory: Negative for cough, chest tightness, shortness of breath and wheezing.   Cardiovascular: Negative for chest pain, palpitations and leg swelling.  Gastrointestinal: Negative for abdominal pain, constipation, diarrhea, nausea and vomiting.       Chronic abdominal distention due to liver cirrhosis   Skin: Negative for color change, pallor and rash.  Neurological: Negative for dizziness, syncope, light-headedness and headaches.  Psychiatric/Behavioral: Negative for agitation, confusion and sleep disturbance. The patient is not nervous/anxious.     Immunization History  Administered Date(s) Administered  . Influenza-Unspecified 07/17/2017   Pertinent  Health Maintenance Due  Topic Date Due  . FOOT EXAM  12/29/1955  . OPHTHALMOLOGY EXAM  12/29/1955  . URINE MICROALBUMIN  [birth year]29[birth year]1956  . HEMOGLOBIN A1C  06/22/2017  . INFLUENZA VACCINE  05/07/2018  . COLONOSCOPY  08/03/2019  . PNA vac Low Risk Adult  Completed   Fall Risk  05/19/2017  Falls in the past year? No   Functional Status Survey:    Vitals:   05/08/18 1028  BP: (!) 105/58  Pulse: 85  Resp: 17  Temp: (!) 97.4 F (36.3 C)  SpO2: 97%  Weight: 172 lb (78 kg)  Height: 6' 0.5" (1.842 m)   Body mass index is 23.01 kg/m. Physical Exam  Constitutional:  Tall frail elderly in no acute distress   HENT:  Head: Normocephalic.  Mouth/Throat: Oropharynx is clear and moist. No oropharyngeal  exudate.  Eyes: Pupils are equal, round, and reactive to light. Conjunctivae are normal. Right eye exhibits no discharge. Left eye exhibits no discharge. No scleral icterus.  Neck: Normal range of motion. No JVD present.  Cardiovascular: Normal rate, regular rhythm, normal heart sounds and intact distal pulses. Exam reveals no gallop and no friction rub.  No murmur heard. Pulmonary/Chest: Effort normal and breath sounds normal. No respiratory distress. He has no wheezes. He has no rales.  Abdominal: Soft. Bowel sounds are normal. He exhibits distension. He exhibits no mass. There is no tenderness. There is no rebound and no guarding. A hernia is present.  Abdominal hernia   Musculoskeletal: He exhibits no edema or tenderness.  Unsteady gait ambulates with walker with assistance   Lymphadenopathy:    He has no cervical adenopathy.  Neurological:  Alert and oriented  to person and place  Skin: Skin is warm and dry. No rash noted. No erythema. No pallor.  Psychiatric: He has a normal mood and affect. His speech is normal. Judgment and thought content normal. He is withdrawn.  Nursing note and vitals reviewed.   Labs reviewed: Recent Labs    10/30/17 03/26/18 04/14/18  NA 134*  134 134* 129  K 3.9  3.9 3.6 4.4  CL 107 109  --   CO2 21 19  --   BUN 30* 28* 42*  CREATININE 1.1  1.05 1.3 1.75  CALCIUM 8.0  8.0 7.3 7.5   Recent Labs    10/30/17 03/26/18 04/14/18  AST 40  40 27 23  ALT 36  36 23 19  ALKPHOS 143*  143 149* 163  BILITOT 0.8  --  2.4  PROT 5.2  5.2 5.0 5.2  ALBUMIN 2.4  2.4 2.2 2.4   Recent Labs    10/30/17 03/26/18 04/23/18  WBC 3.3  3.3 5.2 6.9  HGB 9.6*  9.6 7.6* 5.8  HCT 28*  28.0 21* 16.1  MCV  --   --  100.6  PLT 66* 80*  --    Lab Results  Component Value Date   TSH 2.60 01/08/2018   Lab Results  Component Value Date   HGBA1C 5.2 12/20/2016    Significant Diagnostic Results in last 30 days:  No results found.  Assessment/Plan    Hypotension, unspecified hypotension type Several soft blood pressure readings.Asymptomatic.will change torsemide 20 mg tablet from twice daily to twice daily as needed for edema,increased shortness of breath or abrupt weight gain.continue to monitor B/p twice daily.will further reduce Aldactone or zaroxolyn  if low blood pressure readings persist.   Liver Cirrhosis secondary to NASH No signs of worsening ascites.change Torsemide 20 mg tablet twice daily to as needed due to low blood pressure readings.continue on Aldactone and Zaroxolyn for now will reduce dosage if low B/p persist.continue to monitor weight Mon,wed and Friday.continue on Hospice services.    Family/ staff Communication: Reviewed plan of care with patient and facility Nurse   Labs/tests ordered: None   Nelda Bucks Erna Brossard, NP

## 2018-05-13 ENCOUNTER — Encounter: Payer: Self-pay | Admitting: *Deleted

## 2018-05-20 ENCOUNTER — Encounter: Payer: Self-pay | Admitting: Internal Medicine

## 2018-05-21 LAB — BASIC METABOLIC PANEL
BUN: 49 — AB (ref 4–21)
CREATININE: 1.57
Calcium: 7.6
Glucose: 171
Potassium: 5.1
SODIUM: 131

## 2018-05-22 ENCOUNTER — Encounter: Payer: Self-pay | Admitting: *Deleted

## 2018-05-25 ENCOUNTER — Encounter: Payer: Self-pay | Admitting: Family

## 2018-05-25 ENCOUNTER — Non-Acute Institutional Stay (SKILLED_NURSING_FACILITY): Payer: Medicare Other

## 2018-05-25 ENCOUNTER — Non-Acute Institutional Stay (SKILLED_NURSING_FACILITY): Payer: Medicare Other | Admitting: Family

## 2018-05-25 DIAGNOSIS — K7460 Unspecified cirrhosis of liver: Secondary | ICD-10-CM

## 2018-05-25 DIAGNOSIS — F329 Major depressive disorder, single episode, unspecified: Secondary | ICD-10-CM | POA: Diagnosis not present

## 2018-05-25 DIAGNOSIS — E039 Hypothyroidism, unspecified: Secondary | ICD-10-CM

## 2018-05-25 DIAGNOSIS — R638 Other symptoms and signs concerning food and fluid intake: Secondary | ICD-10-CM | POA: Diagnosis not present

## 2018-05-25 DIAGNOSIS — Z0000 Encounter for general adult medical examination without abnormal findings: Secondary | ICD-10-CM | POA: Diagnosis not present

## 2018-05-25 DIAGNOSIS — F32A Depression, unspecified: Secondary | ICD-10-CM

## 2018-05-25 DIAGNOSIS — K7581 Nonalcoholic steatohepatitis (NASH): Secondary | ICD-10-CM

## 2018-05-25 NOTE — Patient Instructions (Signed)
[name] , Thank you for taking time to come for your Medicare Wellness Visit. I appreciate your ongoing commitment to your health goals. Please review the following plan we discussed and let me know if I can assist you in the future.   Screening recommendations/referrals: Colonoscopy up to date Recommended yearly ophthalmology/optometry visit for glaucoma screening and checkup Recommended yearly dental visit for hygiene and checkup  Vaccinations: Influenza vaccine due, patient is hospice Pneumococcal vaccine up to date, completed Tdap vaccine due, patient is hospice Shingles vaccine not in past records    Advanced directives: in chart  Conditions/risks identified: none  Next appointment: Dr. Bubba Camp makes rounds  Preventive Care 71 Years and Older, Male Preventive care refers to lifestyle choices and visits with your health care provider that can promote health and wellness. What does preventive care include?  A yearly physical exam. This is also called an annual well check.  Dental exams once or twice a year.  Routine eye exams. Ask your health care provider how often you should have your eyes checked.  Personal lifestyle choices, including:  Daily care of your teeth and gums.  Regular physical activity.  Eating a healthy diet.  Avoiding tobacco and drug use.  Limiting alcohol use.  Practicing safe sex.  Taking low doses of aspirin every day.  Taking vitamin and mineral supplements as recommended by your health care provider. What happens during an annual well check? The services and screenings done by your health care provider during your annual well check will depend on your age, overall health, lifestyle risk factors, and family history of disease. Counseling  Your health care provider may ask you questions about your:  Alcohol use.  Tobacco use.  Drug use.  Emotional well-being.  Home and relationship well-being.  Sexual activity.  Eating  habits.  History of falls.  Memory and ability to understand (cognition).  Work and work Statistician. Screening  You may have the following tests or measurements:  Height, weight, and BMI.  Blood pressure.  Lipid and cholesterol levels. These may be checked every 5 years, or more frequently if you are over 42 years old.  Skin check.  Lung cancer screening. You may have this screening every year starting at age 109 if you have a 30-pack-year history of smoking and currently smoke or have quit within the past 15 years.  Fecal occult blood test (FOBT) of the stool. You may have this test every year starting at age 45.  Flexible sigmoidoscopy or colonoscopy. You may have a sigmoidoscopy every 5 years or a colonoscopy every 10 years starting at age 22.  Prostate cancer screening. Recommendations will vary depending on your family history and other risks.  Hepatitis C blood test.  Hepatitis B blood test.  Sexually transmitted disease (STD) testing.  Diabetes screening. This is done by checking your blood sugar (glucose) after you have not eaten for a while (fasting). You may have this done every 1-3 years.  Abdominal aortic aneurysm (AAA) screening. You may need this if you are a current or former smoker.  Osteoporosis. You may be screened starting at age 50 if you are at high risk. Talk with your health care provider about your test results, treatment options, and if necessary, the need for more tests. Vaccines  Your health care provider may recommend certain vaccines, such as:  Influenza vaccine. This is recommended every year.  Tetanus, diphtheria, and acellular pertussis (Tdap, Td) vaccine. You may need a Td booster every 10 years.  Zoster vaccine. You may need this after age 61.  Pneumococcal 13-valent conjugate (PCV13) vaccine. One dose is recommended after age 41.  Pneumococcal polysaccharide (PPSV23) vaccine. One dose is recommended after age 64. Talk to your health  care provider about which screenings and vaccines you need and how often you need them. This information is not intended to replace advice given to you by your health care provider. Make sure you discuss any questions you have with your health care provider. Document Released: 10/20/2015 Document Revised: 06/12/2016 Document Reviewed: 07/25/2015 Elsevier Interactive Patient Education  2017 [place] Prevention in the Home Falls can cause injuries. They can happen to people of all ages. There are many things you can do to make your home safe and to help prevent falls. What can I do on the outside of my home?  Regularly fix the edges of walkways and driveways and fix any cracks.  Remove anything that might make you trip as you walk through a door, such as a raised step or threshold.  Trim any bushes or trees on the path to your home.  Use bright outdoor lighting.  Clear any walking paths of anything that might make someone trip, such as rocks or tools.  Regularly check to see if handrails are loose or broken. Make sure that both sides of any steps have handrails.  Any raised decks and porches should have guardrails on the edges.  Have any leaves, snow, or ice cleared regularly.  Use sand or salt on walking paths during winter.  Clean up any spills in your garage right away. This includes oil or grease spills. What can I do in the bathroom?  Use night lights.  Install grab bars by the toilet and in the tub and shower. Do not use towel bars as grab bars.  Use non-skid mats or decals in the tub or shower.  If you need to sit down in the shower, use a plastic, non-slip stool.  Keep the floor dry. Clean up any water that spills on the floor as soon as it happens.  Remove soap buildup in the tub or shower regularly.  Attach bath mats securely with double-sided non-slip rug tape.  Do not have throw rugs and other things on the floor that can make you trip. What can I do  in the bedroom?  Use night lights.  Make sure that you have a light by your bed that is easy to reach.  Do not use any sheets or blankets that are too big for your bed. They should not hang down onto the floor.  Have a firm chair that has side arms. You can use this for support while you get dressed.  Do not have throw rugs and other things on the floor that can make you trip. What can I do in the kitchen?  Clean up any spills right away.  Avoid walking on wet floors.  Keep items that you use a lot in easy-to-reach places.  If you need to reach something above you, use a strong step stool that has a grab bar.  Keep electrical cords out of the way.  Do not use floor polish or wax that makes floors slippery. If you must use wax, use non-skid floor wax.  Do not have throw rugs and other things on the floor that can make you trip. What can I do with my stairs?  Do not leave any items on the stairs.  Make sure that there are  handrails on both sides of the stairs and use them. Fix handrails that are broken or loose. Make sure that handrails are as long as the stairways.  Check any carpeting to make sure that it is firmly attached to the stairs. Fix any carpet that is loose or worn.  Avoid having throw rugs at the top or bottom of the stairs. If you do have throw rugs, attach them to the floor with carpet tape.  Make sure that you have a light switch at the top of the stairs and the bottom of the stairs. If you do not have them, ask someone to add them for you. What else can I do to help prevent falls?  Wear shoes that:  Do not have high heels.  Have rubber bottoms.  Are comfortable and fit you well.  Are closed at the toe. Do not wear sandals.  If you use a stepladder:  Make sure that it is fully opened. Do not climb a closed stepladder.  Make sure that both sides of the stepladder are locked into place.  Ask someone to hold it for you, if possible.  Clearly mark  and make sure that you can see:  Any grab bars or handrails.  First and last steps.  Where the edge of each step is.  Use tools that help you move around (mobility aids) if they are needed. These include:  Canes.  Walkers.  Scooters.  Crutches.  Turn on the lights when you go into a dark area. Replace any light bulbs as soon as they burn out.  Set up your furniture so you have a clear path. Avoid moving your furniture around.  If any of your floors are uneven, fix them.  If there are any pets around you, be aware of where they are.  Review your medicines with your doctor. Some medicines can make you feel dizzy. This can increase your chance of falling. Ask your doctor what other things that you can do to help prevent falls. This information is not intended to replace advice given to you by your health care provider. Make sure you discuss any questions you have with your health care provider. Document Released: 07/20/2009 Document Revised: 02/29/2016 Document Reviewed: 10/28/2014 Elsevier Interactive Patient Education  2017 [employer].

## 2018-05-25 NOTE — Progress Notes (Signed)
Subjective:   [name] House is a 72 y.o. male who presents for Medicare Annual/Subsequent preventive examination at [facility]; incapacitated patient unable to answer questions appropriately, hospice patient  Last AWV-05/19/2017    Objective:    Vitals: BP 135/70 (BP Location: Left Arm, Patient Position: Sitting)   Pulse 87   Temp 98.2 F (36.8 C) (Oral)   Ht 6\' 5"  (1.956 m)   Wt 172 lb (78 kg)   BMI 20.40 kg/m   Body mass index is 20.4 kg/m.  Advanced Directives 05/25/2018 05/25/2018 05/08/2018 04/21/2018 04/06/2018 04/01/2018 03/23/2018  Does Patient Have a Medical Advance Directive? Yes Yes Yes Yes Yes Yes Yes  Type of Paramedic of [place];Out of facility DNR (pink MOST or yellow form) [place];Out of facility DNR (pink MOST or yellow form) [place];Out of facility DNR (pink MOST or yellow form) [place];Out of facility DNR (pink MOST or yellow form) [place];Out of facility DNR (pink MOST or yellow form) Out of facility DNR (pink MOST or yellow form);Healthcare Power of [place];Out of facility DNR (pink MOST or yellow form)  Does patient want to make changes to medical advance directive? No - Patient declined - - - No - Patient declined No - Patient declined -  Copy of [place] in Chart? Yes Yes Yes Yes Yes Yes Yes  Would patient like information on creating a medical advance directive? - - - - - - -  Pre-existing out of facility DNR order (yellow form or pink MOST form) Yellow form placed in chart (order not valid for inpatient use);Pink MOST form placed in chart (order not valid for inpatient use) Yellow form placed in chart (order not valid for inpatient use);Pink MOST form placed in chart (order not valid for inpatient use) Yellow form placed in chart (order not valid for inpatient use);Pink MOST form placed in  chart (order not valid for inpatient use) Yellow form placed in chart (order not valid for inpatient use);Pink MOST form placed in chart (order not valid for inpatient use) Yellow form placed in chart (order not valid for inpatient use);Pink MOST form placed in chart (order not valid for inpatient use) Yellow form placed in chart (order not valid for inpatient use);Pink MOST form placed in chart (order not valid for inpatient use) Yellow form placed in chart (order not valid for inpatient use);Pink MOST form placed in chart (order not valid for inpatient use)    Tobacco Social History   Tobacco Use  Smoking Status Never Smoker  Smokeless Tobacco Never Used     Counseling given: Not Answered   Clinical Intake:  Pre-visit preparation completed: No  Pain : No/denies pain     Nutritional Risks: None Diabetes: No  How often do you need to have someone help you when you read instructions, pamphlets, or other written materials from your doctor or pharmacy?: 4 - Often  Interpreter Needed?: No  Information entered by :: Tyson Dense, RN  Past Medical History:  Diagnosis Date  . Abdominal hernia   . Ascites   . Benign neoplasm of colon   . Cirrhosis ([place])   . Diabetes mellitus, type 2 ([place])    resolved with gastric bypass  . Diverticulosis of colon (without mention of hemorrhage)   . Esophageal varices ([place])   . Hyperlipemia   . Hypertension   . Iron deficiency anemia secondary  to blood loss (chronic)   . Kidney stones   . Liver cyst   . Lymphopenia   . NASH (nonalcoholic steatohepatitis)   . Osteoarthritis of hip   . Renal cyst   . Splenomegaly   . Unspecified sleep apnea    Resolved   Past Surgical History:  Procedure Laterality Date  . ANKLE SURGERY     Bil  . COLONOSCOPY  2012  . GASTRIC BYPASS  2005  . IR PARACENTESIS  09/29/2017  . IR PARACENTESIS  01/22/2018  . IR PARACENTESIS  02/17/2018  . IR PARACENTESIS  03/30/2018  . KNEE SURGERY     Bil  . POLYPECTOMY     . TOTAL HIP ARTHROPLASTY  2008   right   Family History  Problem Relation Age of Onset  . Heart attack Father        deceased due to MI  . Heart disease Father   . Diabetes Mother        deceased due to complications of DM  . Coronary artery disease Brother   . Colon cancer Neg Hx   . Stomach cancer Neg Hx   . Liver disease Neg Hx    Social History   Socioeconomic History  . Marital status: Married    Spouse name: Not on file  . Number of children: 1  . Years of education: Not on file  . Highest education level: Not on file  Occupational History  . Occupation: retired  . Occupation: sports official  Social Needs  . Financial resource strain: Not hard at all  . Food insecurity:    Worry: Never true    Inability: Never true  . Transportation needs:    Medical: No    Non-medical: No  Tobacco Use  . Smoking status: Never Smoker  . Smokeless tobacco: Never Used  Substance and Sexual Activity  . Alcohol use: No    Alcohol/week: 2.0 - 3.0 standard drinks    Types: 2 - 3 Glasses of wine per week    Comment: none  . Drug use: No  . Sexual activity: Not on file  Lifestyle  . Physical activity:    Days per week: 0 days    Minutes per session: 0 min  . Stress: Not at all  Relationships  . Social connections:    Talks on phone: More than three times a week    Gets together: More than three times a week    Attends religious service: Never    Active member of club or organization: No    Attends meetings of clubs or organizations: Never    Relationship status: Married  Other Topics Concern  . Not on file  Social History Narrative  . Not on file    Outpatient Encounter Medications as of 05/25/2018  Medication Sig  . acetaminophen (TYLENOL) 325 MG tablet Take 650 mg by mouth every 4 (four) hours as needed.  . diphenoxylate-atropine (LOMOTIL) 2.5-0.025 MG tablet Take 1 tablet by mouth 4 (four) times daily as needed for diarrhea or loose stools.  . ferrous sulfate  325 (65 FE) MG tablet Take 325 mg by mouth daily with breakfast.  . lactulose (CHRONULAC) 10 GM/15ML solution Take 10 g 3 (three) times daily by mouth. Take 45 mL. Hold for >3 bowel movements per day  . levothyroxine (SYNTHROID, LEVOTHROID) 25 MCG tablet Take 25 mcg by mouth daily before breakfast.  . metolazone (ZAROXOLYN) 2.5 MG tablet Take 2.5 mg by mouth 3 (three) times  a week. M,W,F with morning dose of Torsemide  . Nutritional Supplements (BOOST BREEZE PO) Take 1 Container by mouth 3 (three) times daily between meals.   . potassium chloride SA (K-DUR,KLOR-CON) 20 MEQ tablet Take 20 mEq by mouth 2 (two) times daily.  . rifaximin (XIFAXAN) 550 MG TABS tablet Take 550 mg by mouth 2 (two) times daily.  . sertraline (ZOLOFT) 25 MG tablet Take 25 mg by mouth at bedtime.   Marland Kitchen spironolactone (ALDACTONE) 100 MG tablet Take 100 mg by mouth daily. Hold if SBP <100  . torsemide (DEMADEX) 20 MG tablet Take 20 mg by mouth 2 (two) times daily. Hold if SBP <110   No facility-administered encounter medications on file as of 05/25/2018.     Activities of Daily Living In your present state of health, do you have any difficulty performing the following activities: 05/25/2018  Hearing? N  Vision? N  Difficulty concentrating or making decisions? Y  Walking or climbing stairs? Y  Dressing or bathing? Y  Doing errands, shopping? Y  Preparing Food and eating ? Y  Using the Toilet? Y  In the past six months, have you accidently leaked urine? Y  Do you have problems with loss of bowel control? Y  Managing your Medications? Y  Managing your Finances? Y  Housekeeping or managing your Housekeeping? Y  Some recent data might be hidden    Patient Care Team: Blanchie Serve, MD as PCP - General (Internal Medicine) Ngetich, Nelda Bucks, NP as Nurse Practitioner (Family Medicine)   Assessment:   This is a routine wellness examination for [facility].  Exercise Activities and Dietary recommendations Current Exercise  Habits: The patient does not participate in regular exercise at present, Exercise limited by: orthopedic condition(s)  Goals   None     Fall Risk Fall Risk  05/25/2018 05/19/2017  Falls in the past year? Yes No  Number falls in past yr: 2 or more -  Injury with Fall? No -   Is the patient's home free of loose throw rugs in walkways, pet beds, electrical cords, etc?   yes      Grab bars in the bathroom? yes      Handrails on the stairs?   yes      Adequate lighting?   yes  Timed Get Up and Go Performed: Patient is unambulatory  Depression Screen PHQ 2/9 Scores 05/25/2018 05/19/2017  PHQ - 2 Score 0 0    Cognitive Function MMSE - Mini Mental State Exam 05/25/2018 05/19/2017  Orientation to time 0 0  Orientation to Place 4 3  Registration 3 0  Attention/ Calculation 0 5  Recall 1 0  Language- name 2 objects 2 2  Language- repeat 1 1  Language- follow 3 step command 1 2  Language- read & follow direction 0 1  Write a sentence 0 0  Copy design 0 0  Total score 12 14        Immunization History  Administered Date(s) Administered  . Influenza-Unspecified 07/17/2017    Qualifies for Shingles Vaccine? Not in past records  Screening Tests Health Maintenance  Topic Date Due  . FOOT EXAM  12/29/1956  . OPHTHALMOLOGY EXAM  12/29/1956  . URINE MICROALBUMIN  12/29/1956  . HEMOGLOBIN A1C  06/22/2017  . INFLUENZA VACCINE  05/07/2018  . TETANUS/TDAP  10/07/2018 (Originally 12/29/1964)  . Hepatitis C Screening  08/11/2019 (Originally 07-06-1946)  . COLONOSCOPY  08/03/2019  . PNA vac Low Risk Adult  Completed   Cancer  Screenings: Lung: Low Dose CT Chest recommended if Age 49-80 years, 30 pack-year currently smoking OR have quit w/in 15years. Patient does not qualify. Colorectal: up to date  Additional Screenings:  Hepatitis C Screening:  TDAP due:not ordered, patient is hospice    Plan:    I have personally reviewed and addressed the Medicare Annual Wellness questionnaire  and have noted the following in the patient's chart:  A. Medical and social history B. Use of alcohol, tobacco or illicit drugs  C. Current medications and supplements D. Functional ability and status E.  Nutritional status F.  Physical activity G. Advance directives H. List of other physicians I.  Hospitalizations, surgeries, and ER visits in previous 12 months J.  [place] to include hearing, vision, cognitive, depression L. Referrals and appointments - none  In addition, I have reviewed and discussed with patient certain preventive protocols, quality metrics, and best practice recommendations. A written personalized care plan for preventive services as well as general preventive health recommendations were provided to patient.  See attached scanned questionnaire for additional information.   Signed,   Tyson Dense, RN Nurse Health Advisor  Patient concerns: None

## 2018-05-25 NOTE — Progress Notes (Signed)
Location:  [facility] Room Number: 35 Place of Service:  SNF (31) Provider: Timothey Dahlstrom FNP-C   Blanchie Serve, MD  Patient Care Team: Blanchie Serve, MD as PCP - General (Internal Medicine) Collis Thede, Nelda Bucks, NP as Nurse Practitioner (Family Medicine)  Extended Emergency Contact Information Primary Emergency Contact: Suszanne Conners Address: [address]          [place], [place] [address] [place] Phone: [phone] Relation: Spouse  Code Status: DNR Goals of care: Advanced Directive information Advanced Directives 05/25/2018  Does Patient Have a Medical Advance Directive? Yes  Type of Paramedic of [place];Out of facility DNR (pink MOST or yellow form)  Does patient want to make changes to medical advance directive? No - Patient declined  Copy of [place] in Chart? Yes  Would patient like information on creating a medical advance directive? -  Pre-existing out of facility DNR order (yellow form or pink MOST form) Yellow form placed in chart (order not valid for inpatient use);Pink MOST form placed in chart (order not valid for inpatient use)     Chief Complaint  Patient presents with  . Medical Management of Chronic Issues    HPI:  Pt is a 72 y.o. male seen today [place] for medical management of chronic diseases.He has a medical history of liver cirrhosis secondary to [place],CAD,CKD stage 4, OA,Protein malnutrition,major depression among other conditions.He is seen in his room today.He denies any acute issues during visit.he responds yes or no to questions.Facility Nurse states patient refuses meals but drinks protein supplement only.He refuses to be assisted out of bed and has required assistance with feeding. He denies worsening symptoms of depression.No recent fall episodes or acute illness.His weight log reviewed weight remains stable.He continues to follow up with Hospice  service for comfort care.    Past Medical History:  Diagnosis Date  . Abdominal hernia   . Ascites   . Benign neoplasm of colon   . Cirrhosis ([place])   . Diabetes mellitus, type 2 ([place])    resolved with gastric bypass  . Diverticulosis of colon (without mention of hemorrhage)   . Esophageal varices ([place])   . Hyperlipemia   . Hypertension   . Iron deficiency anemia secondary to blood loss (chronic)   . Kidney stones   . Liver cyst   . Lymphopenia   . NASH (nonalcoholic steatohepatitis)   . Osteoarthritis of hip   . Renal cyst   . Splenomegaly   . Unspecified sleep apnea    Resolved   Past Surgical History:  Procedure Laterality Date  . ANKLE SURGERY     [place]  . COLONOSCOPY  2012  . GASTRIC BYPASS  2005  . IR PARACENTESIS  09/29/2017  . IR PARACENTESIS  01/22/2018  . IR PARACENTESIS  02/17/2018  . IR PARACENTESIS  03/30/2018  . KNEE SURGERY     [place]  . POLYPECTOMY    . TOTAL HIP ARTHROPLASTY  2008   right    Allergies  Allergen Reactions  . Oxycodone Other (See Comments)    Reaction:  Caused pt to fall     Allergies as of 05/25/2018      Reactions   Oxycodone Other (See Comments)   Reaction:  Caused pt to fall       Medication List        Accurate as of 05/25/18  4:06 PM. Always use your most recent med list.  acetaminophen 325 MG tablet Commonly known as:  TYLENOL Take 650 mg by mouth every 4 (four) hours as needed.   BOOST BREEZE PO Take 1 Container by mouth 3 (three) times daily between meals.   diphenoxylate-atropine 2.5-0.025 MG tablet Commonly known as:  LOMOTIL Take 1 tablet by mouth 4 (four) times daily as needed for diarrhea or loose stools.   ferrous sulfate 325 (65 FE) MG tablet Take 325 mg by mouth daily with breakfast.   lactulose 10 GM/15ML solution Commonly known as:  CHRONULAC Take 10 g 3 (three) times daily by mouth. Take 45 mL. Hold for >3 bowel movements per day   levothyroxine 25 MCG tablet Commonly known as:   SYNTHROID, LEVOTHROID Take 25 mcg by mouth daily before breakfast.   metolazone 2.5 MG tablet Commonly known as:  ZAROXOLYN Take 2.5 mg by mouth 3 (three) times a week. M,W,F with morning dose of Torsemide   potassium chloride SA 20 MEQ tablet Commonly known as:  K-DUR,KLOR-CON Take 20 mEq by mouth 2 (two) times daily.   rifaximin 550 MG Tabs tablet Commonly known as:  XIFAXAN Take 550 mg by mouth 2 (two) times daily.   sertraline 25 MG tablet Commonly known as:  ZOLOFT Take 25 mg by mouth at bedtime.   spironolactone 100 MG tablet Commonly known as:  ALDACTONE Take 100 mg by mouth daily. Hold if SBP <100   torsemide 20 MG tablet Commonly known as:  DEMADEX Take 20 mg by mouth 2 (two) times daily. Hold if SBP <110       Review of Systems  Reason unable to perform ROS: additional information provided by facility Nurse   Constitutional: Negative for appetite change, chills, fatigue, fever and unexpected weight change.  HENT: Negative for congestion, rhinorrhea, sinus pressure, sinus pain, sneezing and sore throat.   Eyes: Negative for discharge, redness, itching and visual disturbance.  Respiratory: Negative for cough, chest tightness and shortness of breath.   Cardiovascular: Negative for chest pain, palpitations and leg swelling.  Gastrointestinal: Negative for abdominal pain, constipation, diarrhea, nausea and vomiting.  Endocrine: Negative for cold intolerance, heat intolerance, polydipsia, polyphagia and polyuria.  Genitourinary: Negative for dysuria and urgency.       Incontinent   Musculoskeletal: Positive for gait problem.  Skin: Negative for color change, pallor and rash.  Neurological: Negative for dizziness, light-headedness and headaches.  Hematological: Does not bruise/bleed easily.  Psychiatric/Behavioral: Negative for agitation and sleep disturbance. The patient is not nervous/anxious.     Immunization History  Administered Date(s) Administered  .  Influenza-Unspecified 07/17/2017   Pertinent  Health Maintenance Due  Topic Date Due  . FOOT EXAM  12/29/1955  . OPHTHALMOLOGY EXAM  12/29/1955  . URINE MICROALBUMIN  [birth year]/29/1955  . HEMOGLOBIN A1C  06/22/2017  . INFLUENZA VACCINE  05/07/2018  . COLONOSCOPY  08/03/2019  . PNA vac Low Risk Adult  Completed   Fall Risk  05/25/2018 05/19/2017  Falls in the past year? Yes No  Number falls in past yr: 2 or more -  Injury with Fall? No -   Functional Status Survey: Is the patient deaf or have difficulty hearing?: No Does the patient have difficulty seeing, even when wearing glasses/contacts?: No Does the patient have difficulty concentrating, remembering, or making decisions?: Yes Does the patient have difficulty walking or climbing stairs?: Yes Does the patient have difficulty dressing or bathing?: Yes Does the patient have difficulty doing errands alone such as visiting a doctor's office or shopping?: Yes  Vitals:   05/25/18 1127  BP: (!) 141/69  Pulse: 89  Resp: 20  Temp: 97.6 F (36.4 C)  SpO2: 96%  Weight: 172 lb (78 kg)  Height: 6' 0.5" (1.842 m)   Body mass index is 23.01 kg/m. Physical Exam  Constitutional:  Tall thin elderly in no acute distress   HENT:  Head: Normocephalic.  Right Ear: External ear normal.  Left Ear: External ear normal.  Mouth/Throat: Oropharynx is clear and moist. No oropharyngeal exudate.  Eyes: Pupils are equal, round, and reactive to light. Conjunctivae and EOM are normal. Right eye exhibits no discharge. Left eye exhibits no discharge. No scleral icterus.  Neck: Normal range of motion. No JVD present. No thyromegaly present.  Cardiovascular: Normal rate, regular rhythm, normal heart sounds and intact distal pulses. Exam reveals no gallop and no friction rub.  No murmur heard. Pulmonary/Chest: Effort normal and breath sounds normal. No respiratory distress. He has no wheezes. He has no rales.  Abdominal: Soft. Bowel sounds are normal. He  exhibits distension. He exhibits no mass. There is no tenderness. There is no rebound and no guarding. A hernia is present.  Musculoskeletal: He exhibits no edema or tenderness.  Moves x 4 extremities.unsteady gait requires assistance with transfer  Lymphadenopathy:    He has no cervical adenopathy.  Neurological: He is alert. Gait abnormal.  Skin: Skin is warm and dry. No rash noted. No erythema. No pallor.  Psychiatric: His speech is normal and behavior is normal. Judgment and thought content normal. He exhibits abnormal recent memory.  Flat affect   Nursing note and vitals reviewed.  Labs reviewed: Recent Labs    10/30/17 03/26/18 04/14/18 04/23/18 05/21/18  NA 134*  134 134* 129 131 131  K 3.9  3.9 3.6 4.4 4.4 5.1  CL 107 109  --   --   --   CO2 21 19  --   --   --   BUN 30* 28* 42* 50* 49*  CREATININE 1.1  1.05 1.3 1.75 1.85 1.57  CALCIUM 8.0  8.0 7.3 7.5 7.4 7.6   Recent Labs    10/30/17 03/26/18 04/14/18  AST 40  40 27 23  ALT 36  36 23 19  ALKPHOS 143*  143 149* 163  BILITOT 0.8  --  2.4  PROT 5.2  5.2 5.0 5.2  ALBUMIN 2.4  2.4 2.2 2.4   Recent Labs    10/30/17 03/26/18 04/23/18  WBC 3.3  3.3 5.2 6.9  HGB 9.6*  9.6 7.6* 5.8  HCT 28*  28.0 21* 16.1  MCV  --   --  100.6  PLT 66* 80*  --    Lab Results  Component Value Date   TSH 2.60 01/08/2018   Lab Results  Component Value Date   HGBA1C 5.2 12/20/2016    Significant Diagnostic Results in last 30 days:  No results found.  Assessment/Plan 1. Liver cirrhosis secondary to NASH  No worsening edema,weight gain or ascites.last paracentesis done 03/31/2018.continue on rifaximin 550 mg tablet twice daily.continue to follow up with hospice service.    2. Decreased oral intake Refuses meals per nurse.continue on protein supplements.continue to follow up with Hospice service.Encourage oral intake as tolerated.   3. Chronic depression Flat affect.continue on sertraline 25 mg tablet at bedtime.continue  to monitor for mood changes.   4. Acquired hypothyroidism Lab Results  Component Value Date   TSH 2.60 01/08/2018  Continue on levothyroxine 25 mcg tablet daily before breakfast.  Family/ staff Communication: Reviewed plan of care with patient and facility Nurse.  Labs/tests ordered: None   Theodora Lalanne C Jenaro Souder, NP

## 2018-07-07 DEATH — deceased

## 2019-02-03 IMAGING — CT CT HEAD W/O CM
3 of 4 series · 15 of 47 positions shown, 18 images · non-contrast
Comparison: None.

CLINICAL DATA: Altered mental status.  Confusion.

EXAM:
CT HEAD WITHOUT CONTRAST
TECHNIQUE: Contiguous axial images were obtained from the base of the skull
through the vertex without intravenous contrast.

[Series 2: head w/o · axial · non-contrast · 0.42mm/px · z∈[+1383,+1508]mm · 9 of 31 slices shown, 12 images]
[im 3/31  brain]
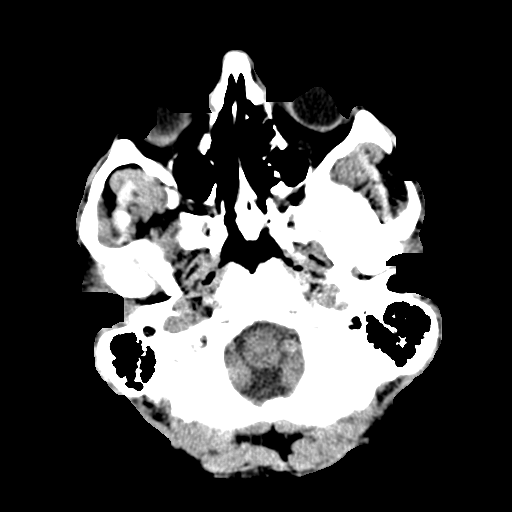
[im 3/31  bone]
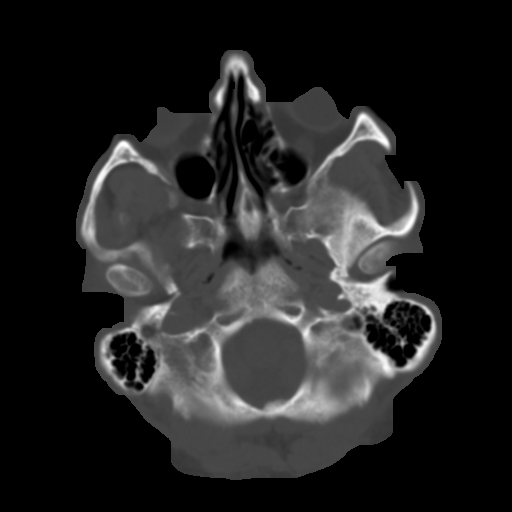
[im 7/31  brain]
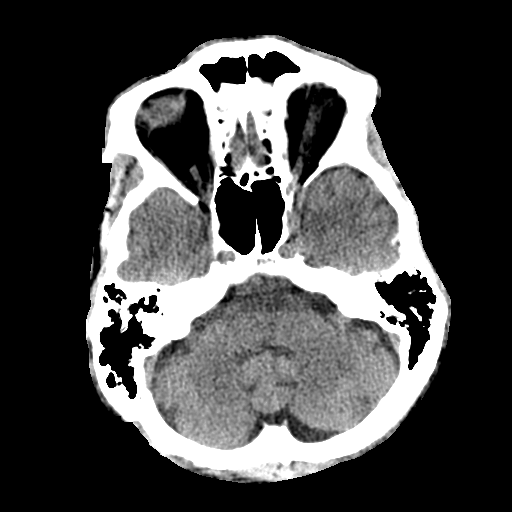
[im 9/31  brain]
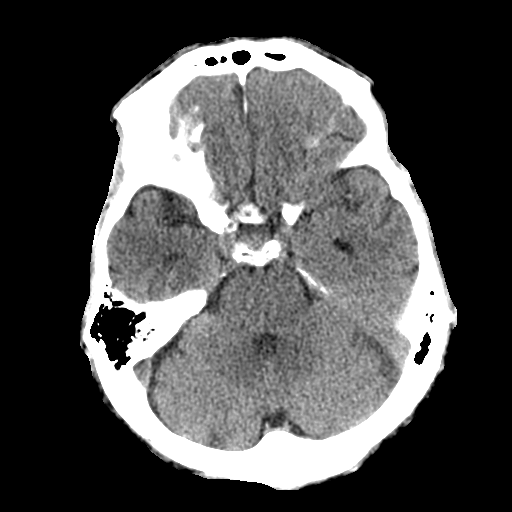
[im 13/31  brain]
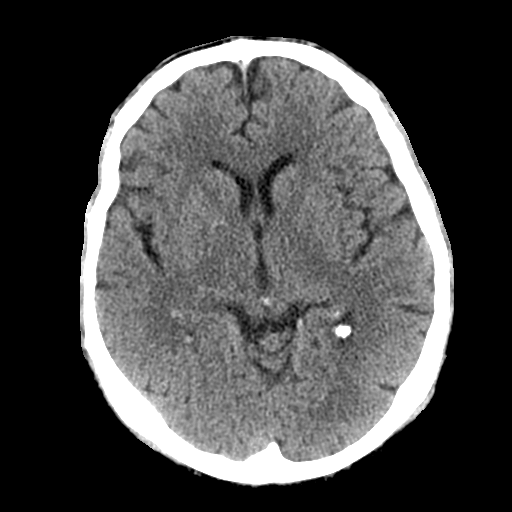
[im 16/31  brain]
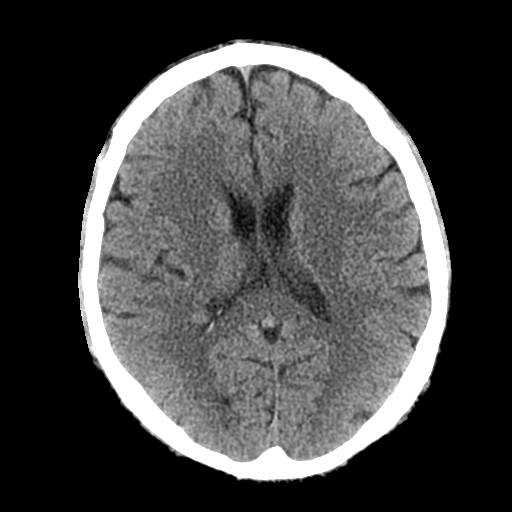
[im 16/31  bone]
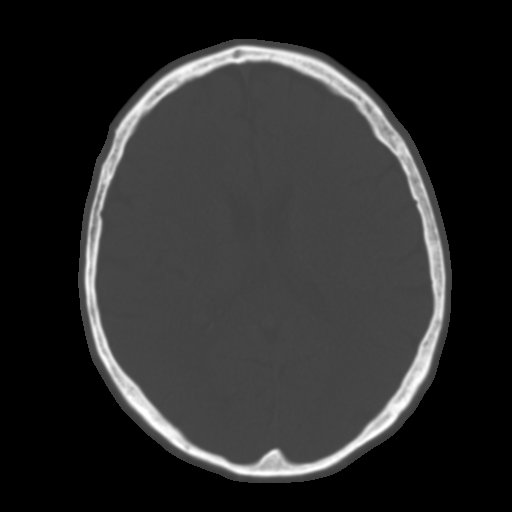
[im 18/31  brain]
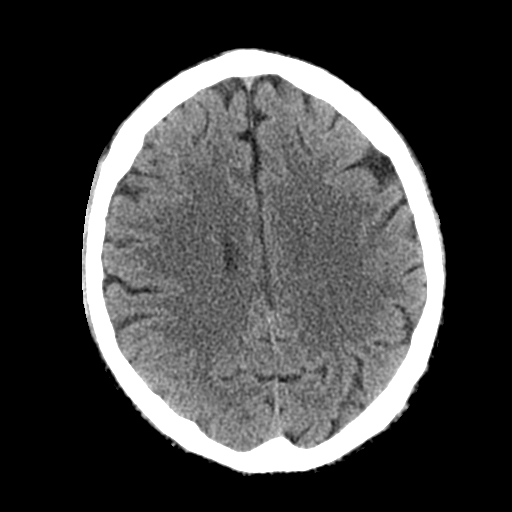
[im 22/31  brain]
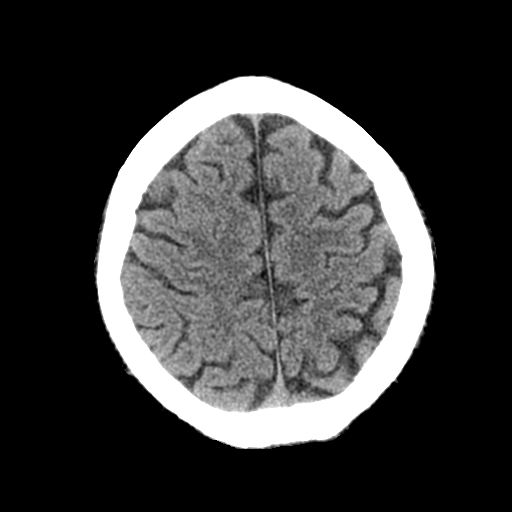
[im 24/31  brain]
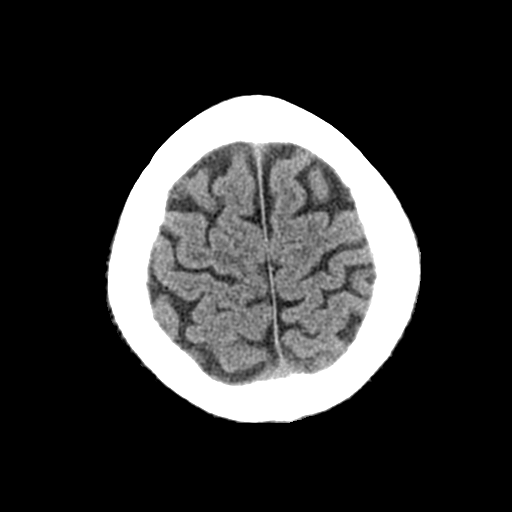
[im 28/31  brain]
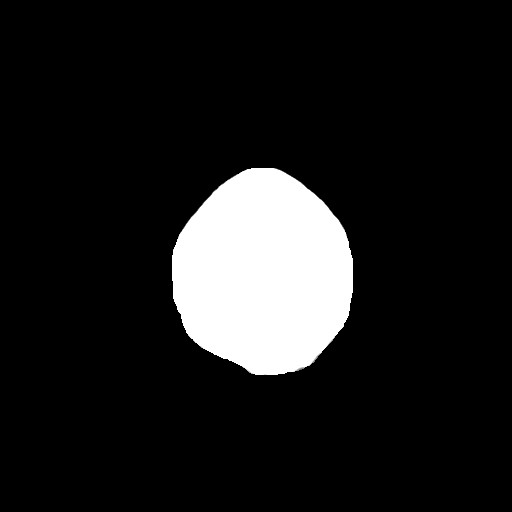
[im 28/31  bone]
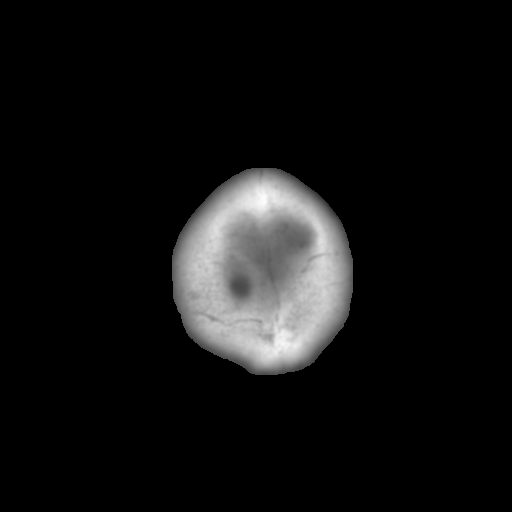

[Series 5: coronal · coronal · 0.30mm/px · 3 of 67 slices shown]
[im 23/67  brain]
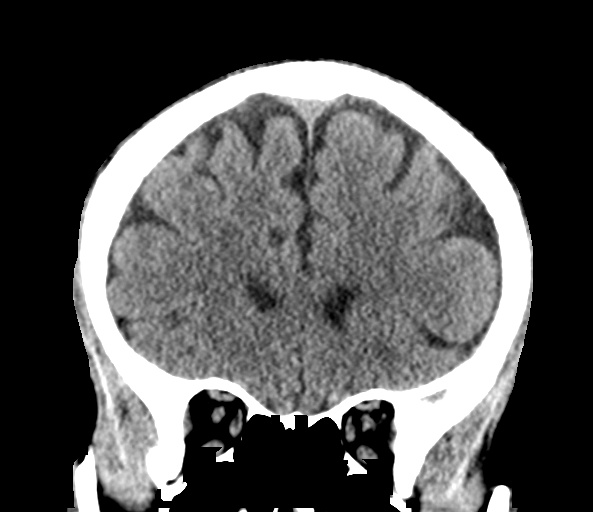
[im 30/67  brain]
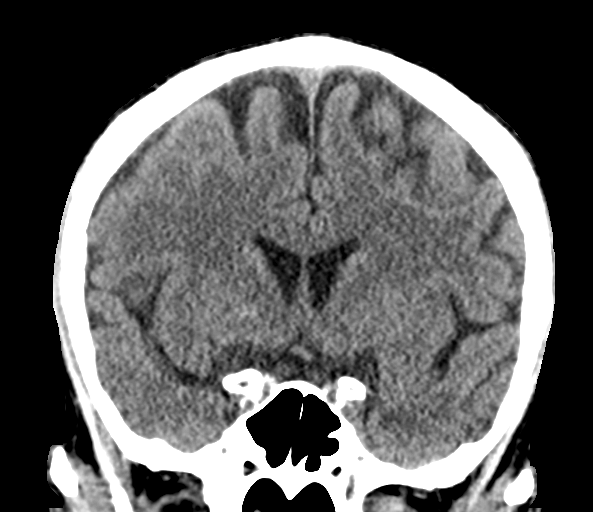
[im 37/67  brain]
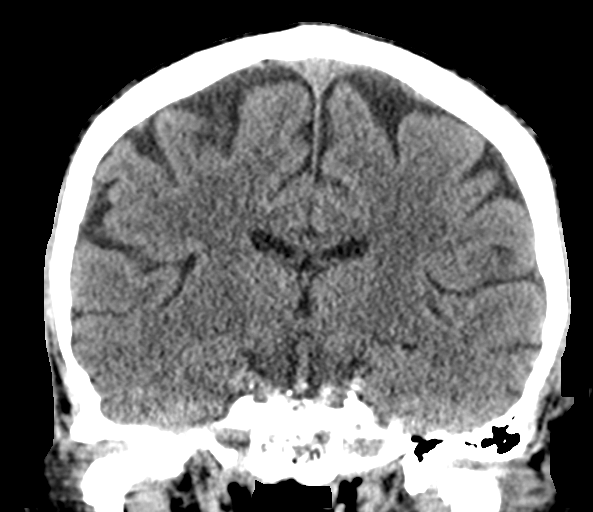

[Series 6: sagittal · sagittal · 0.29mm/px · 3 of 60 slices shown]
[im 20/60  brain]
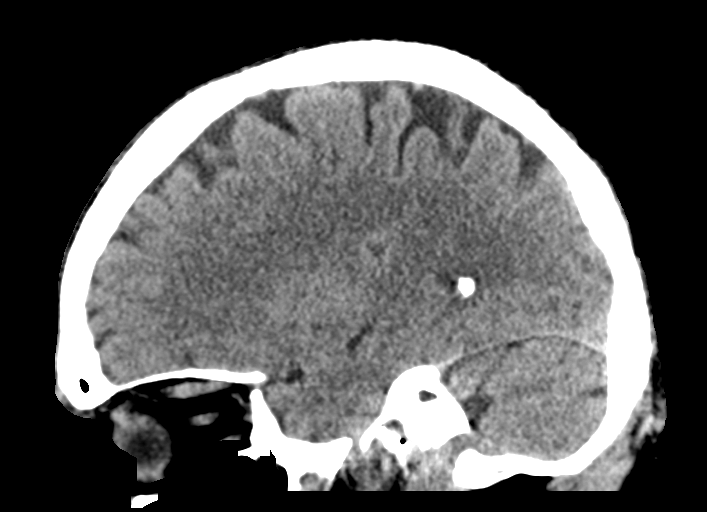
[im 30/60  brain]
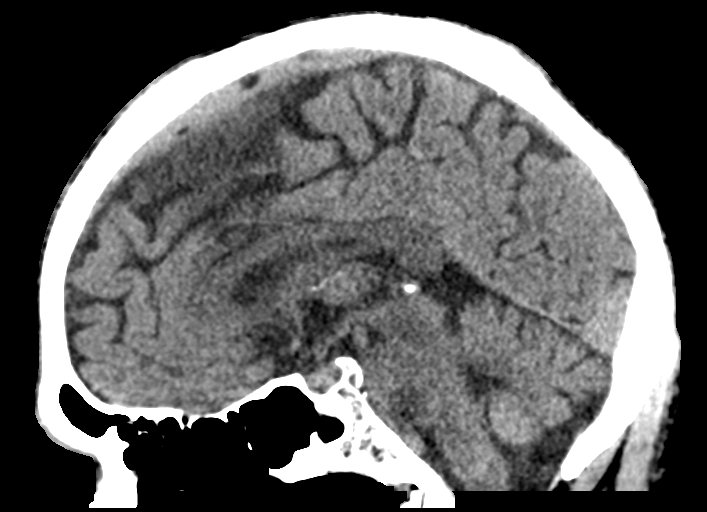
[im 40/60  brain]
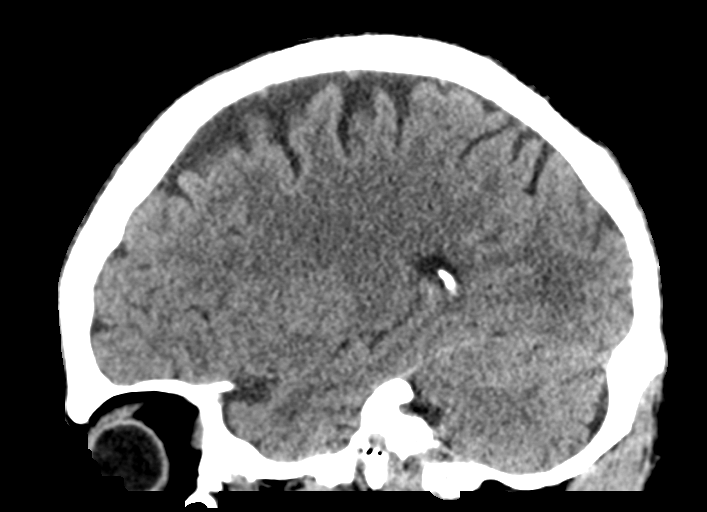

[15 of 47 positions shown; findings below may reference images not displayed]

FINDINGS: Brain: No evidence of acute infarction, hemorrhage, hydrocephalus,
extra-axial collection or mass lesion/mass effect.

Vascular: Vascular calcifications are present.

Skull: Normal. Negative for fracture or focal lesion.

Sinuses/Orbits: No acute finding.

Other: None.
IMPRESSION: No acute intracranial abnormalities.

## 2019-09-21 IMAGING — US IR PARACENTESIS
1 series · 2 of 2 positions shown · non-contrast
Comparison: none

INDICATION: Patient with recurrent ascites. Request is made for therapeutic
paracentesis.

[Series 1: ir (id) (id)/(id)/(id) ir · 2 of 2 slices shown]
[im 1/2]
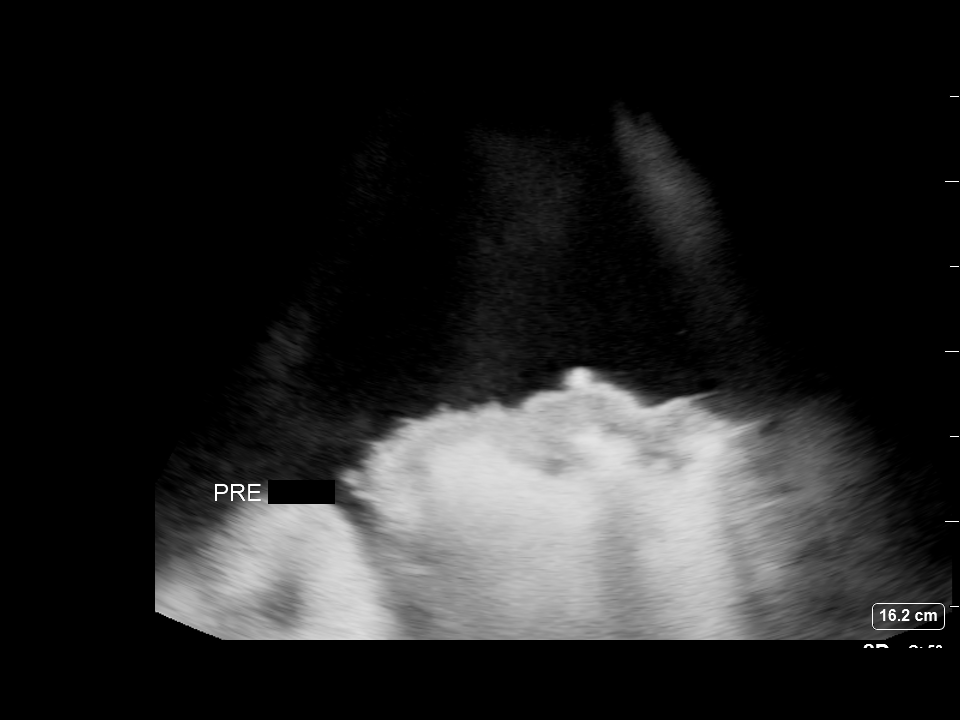
[im 2/2]
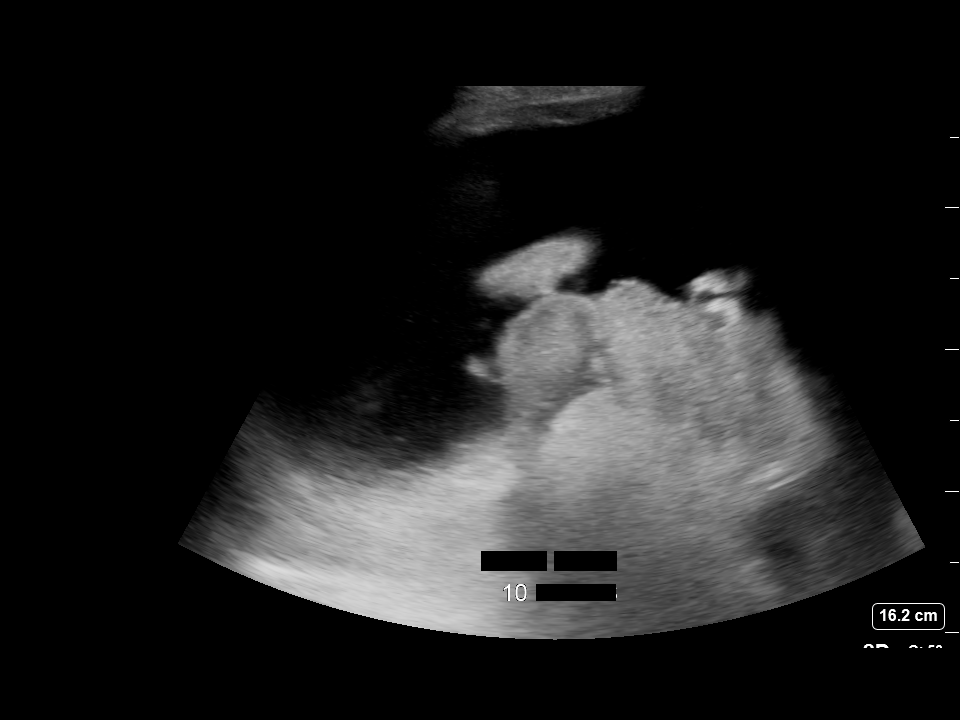

[2 of 2 positions shown; findings below may reference images not displayed]

EXAM:
ULTRASOUND GUIDED THERAPEUTIC PARACENTESIS

MEDICATIONS:
10 mL 2% lidocaine

COMPLICATIONS:
None immediate.

PROCEDURE:
Informed written consent was obtained from the patient after a
discussion of the risks, benefits and alternatives to treatment. A
timeout was performed prior to the initiation of the procedure.

Initial ultrasound scanning demonstrates a large amount of ascites
within the right lateral abdomen. The right lateral abdomen was
prepped and draped in the usual sterile fashion. 2% lidocaine was
used for local anesthesia.

Following this, a 19 gauge, 7-cm, Yueh catheter was introduced. An
ultrasound image was saved for documentation purposes. The
paracentesis was performed. The catheter was removed and a dressing
was applied. The patient tolerated the procedure well without
immediate post procedural complication.
FINDINGS: A total of approximately 10.0 liters of clear, yellow fluid was
removed.
IMPRESSION: Successful ultrasound-guided therapeutic paracentesis yielding
liters of peritoneal fluid.
